# Patient Record
Sex: Female | Born: 1945
Health system: Southern US, Community
[De-identification: ages and names within clinical notes are randomized; demographics above are authoritative.]

## PROBLEM LIST (undated history)

## (undated) DIAGNOSIS — R112 Nausea with vomiting, unspecified: Secondary | ICD-10-CM

## (undated) DIAGNOSIS — Z9889 Other specified postprocedural states: Secondary | ICD-10-CM

## (undated) DIAGNOSIS — R0602 Shortness of breath: Secondary | ICD-10-CM

## (undated) DIAGNOSIS — I10 Essential (primary) hypertension: Secondary | ICD-10-CM

## (undated) DIAGNOSIS — C801 Malignant (primary) neoplasm, unspecified: Secondary | ICD-10-CM

## (undated) DIAGNOSIS — E785 Hyperlipidemia, unspecified: Secondary | ICD-10-CM

## (undated) DIAGNOSIS — Z923 Personal history of irradiation: Secondary | ICD-10-CM

## (undated) DIAGNOSIS — M810 Age-related osteoporosis without current pathological fracture: Secondary | ICD-10-CM

## (undated) DIAGNOSIS — K589 Irritable bowel syndrome without diarrhea: Secondary | ICD-10-CM

## (undated) HISTORY — PX: WISDOM TOOTH EXTRACTION: SHX21

## (undated) HISTORY — PX: COLONOSCOPY: SHX174

## (undated) HISTORY — PX: SKIN CANCER EXCISION: SHX779

## (undated) HISTORY — PX: BREAST ENHANCEMENT SURGERY: SHX7

## (undated) HISTORY — DX: Age-related osteoporosis without current pathological fracture: M81.0

## (undated) HISTORY — PX: EYE SURGERY: SHX253

## (undated) HISTORY — PX: BUNIONECTOMY: SHX129

## (undated) HISTORY — DX: Irritable bowel syndrome, unspecified: K58.9

## (undated) HISTORY — DX: Hyperlipidemia, unspecified: E78.5

---

## 1999-04-01 ENCOUNTER — Other Ambulatory Visit: Admission: RE | Admit: 1999-04-01 | Discharge: 1999-04-01 | Payer: Self-pay | Admitting: Obstetrics and Gynecology

## 2000-04-04 ENCOUNTER — Other Ambulatory Visit: Admission: RE | Admit: 2000-04-04 | Discharge: 2000-04-04 | Payer: Self-pay | Admitting: Obstetrics and Gynecology

## 2001-04-06 ENCOUNTER — Other Ambulatory Visit: Admission: RE | Admit: 2001-04-06 | Discharge: 2001-04-06 | Payer: Self-pay | Admitting: Obstetrics and Gynecology

## 2004-04-13 ENCOUNTER — Ambulatory Visit: Payer: Self-pay | Admitting: Family Medicine

## 2004-04-16 ENCOUNTER — Ambulatory Visit: Payer: Self-pay | Admitting: Family Medicine

## 2005-01-12 ENCOUNTER — Ambulatory Visit: Payer: Self-pay | Admitting: Family Medicine

## 2005-04-22 ENCOUNTER — Ambulatory Visit: Payer: Self-pay | Admitting: Family Medicine

## 2005-04-28 ENCOUNTER — Encounter: Admission: RE | Admit: 2005-04-28 | Discharge: 2005-04-28 | Payer: Self-pay | Admitting: Family Medicine

## 2005-08-04 ENCOUNTER — Other Ambulatory Visit: Admission: RE | Admit: 2005-08-04 | Discharge: 2005-08-04 | Payer: Self-pay | Admitting: Obstetrics and Gynecology

## 2006-01-24 ENCOUNTER — Encounter: Admission: RE | Admit: 2006-01-24 | Discharge: 2006-01-24 | Payer: Self-pay | Admitting: Radiology

## 2006-08-05 ENCOUNTER — Encounter: Payer: Self-pay | Admitting: Family Medicine

## 2006-10-20 ENCOUNTER — Ambulatory Visit: Payer: Self-pay | Admitting: Family Medicine

## 2006-10-20 ENCOUNTER — Telehealth (INDEPENDENT_AMBULATORY_CARE_PROVIDER_SITE_OTHER): Payer: Self-pay | Admitting: *Deleted

## 2006-10-20 DIAGNOSIS — Z8719 Personal history of other diseases of the digestive system: Secondary | ICD-10-CM | POA: Insufficient documentation

## 2006-10-20 DIAGNOSIS — R079 Chest pain, unspecified: Secondary | ICD-10-CM | POA: Insufficient documentation

## 2006-10-24 ENCOUNTER — Telehealth (INDEPENDENT_AMBULATORY_CARE_PROVIDER_SITE_OTHER): Payer: Self-pay | Admitting: *Deleted

## 2006-10-25 ENCOUNTER — Ambulatory Visit: Payer: Self-pay

## 2006-10-25 ENCOUNTER — Encounter (INDEPENDENT_AMBULATORY_CARE_PROVIDER_SITE_OTHER): Payer: Self-pay | Admitting: Family Medicine

## 2006-11-11 ENCOUNTER — Ambulatory Visit: Payer: Self-pay | Admitting: Family Medicine

## 2006-11-11 DIAGNOSIS — K219 Gastro-esophageal reflux disease without esophagitis: Secondary | ICD-10-CM | POA: Insufficient documentation

## 2006-11-11 DIAGNOSIS — D6489 Other specified anemias: Secondary | ICD-10-CM | POA: Insufficient documentation

## 2006-11-11 DIAGNOSIS — E785 Hyperlipidemia, unspecified: Secondary | ICD-10-CM | POA: Insufficient documentation

## 2006-11-11 DIAGNOSIS — R05 Cough: Secondary | ICD-10-CM

## 2006-11-11 DIAGNOSIS — M810 Age-related osteoporosis without current pathological fracture: Secondary | ICD-10-CM | POA: Insufficient documentation

## 2006-11-11 DIAGNOSIS — R059 Cough, unspecified: Secondary | ICD-10-CM | POA: Insufficient documentation

## 2006-11-14 ENCOUNTER — Telehealth (INDEPENDENT_AMBULATORY_CARE_PROVIDER_SITE_OTHER): Payer: Self-pay | Admitting: *Deleted

## 2006-11-15 ENCOUNTER — Ambulatory Visit: Payer: Self-pay | Admitting: Cardiology

## 2006-11-15 LAB — CONVERTED CEMR LAB
ALT: 12 units/L (ref 0–35)
AST: 18 units/L (ref 0–37)
Albumin: 3.8 g/dL (ref 3.5–5.2)
Alkaline Phosphatase: 50 units/L (ref 39–117)
Basophils Absolute: 0.1 10*3/uL (ref 0.0–0.1)
Basophils Relative: 1.1 % — ABNORMAL HIGH (ref 0.0–1.0)
Bilirubin, Direct: 0.1 mg/dL (ref 0.0–0.3)
Cholesterol: 246 mg/dL (ref 0–200)
Direct LDL: 164.3 mg/dL
Eosinophils Absolute: 0.3 10*3/uL (ref 0.0–0.6)
Eosinophils Relative: 5.4 % — ABNORMAL HIGH (ref 0.0–5.0)
Ferritin: 21.5 ng/mL (ref 10.0–291.0)
HCT: 35.6 % — ABNORMAL LOW (ref 36.0–46.0)
HDL: 71.3 mg/dL (ref >39.0)
Hemoglobin: 12.1 g/dL (ref 12.0–15.0)
Iron: 135 ug/dL (ref 42–145)
Lymphocytes Relative: 23.9 % (ref 12.0–46.0)
MCHC: 34.1 g/dL (ref 30.0–36.0)
MCV: 96.5 fL (ref 78.0–100.0)
Monocytes Absolute: 0.6 10*3/uL (ref 0.2–0.7)
Monocytes Relative: 11.3 % — ABNORMAL HIGH (ref 3.0–11.0)
Neutro Abs: 3.3 10*3/uL (ref 1.4–7.7)
Neutrophils Relative %: 58.3 % (ref 43.0–77.0)
Platelets: 246 10*3/uL (ref 150–400)
RBC: 3.68 M/uL — ABNORMAL LOW (ref 3.87–5.11)
RDW: 12.2 % (ref 11.5–14.6)
Total Bilirubin: 1 mg/dL (ref 0.3–1.2)
Total CHOL/HDL Ratio: 3.5
Total Protein: 7.3 g/dL (ref 6.0–8.3)
Triglycerides: 57 mg/dL (ref 0–149)
VLDL: 11 mg/dL (ref 0–40)
WBC: 5.6 10*3/uL (ref 4.5–10.5)

## 2006-11-17 ENCOUNTER — Ambulatory Visit: Payer: Self-pay | Admitting: Cardiology

## 2006-11-17 LAB — CONVERTED CEMR LAB
BUN: 11 mg/dL (ref 6–23)
Basophils Absolute: 0 10*3/uL (ref 0.0–0.1)
Basophils Relative: 0.8 % (ref 0.0–1.0)
CO2: 33 meq/L — ABNORMAL HIGH (ref 19–32)
Calcium: 9.7 mg/dL (ref 8.4–10.5)
Chloride: 102 meq/L (ref 96–112)
Creatinine, Ser: 0.7 mg/dL (ref 0.4–1.2)
Eosinophils Absolute: 0.3 10*3/uL (ref 0.0–0.6)
Eosinophils Relative: 5.6 % — ABNORMAL HIGH (ref 0.0–5.0)
GFR calc Af Amer: 111 mL/min
GFR calc non Af Amer: 91 mL/min
Glucose, Bld: 85 mg/dL (ref 70–99)
HCT: 33.9 % — ABNORMAL LOW (ref 36.0–46.0)
Hemoglobin: 11.8 g/dL — ABNORMAL LOW (ref 12.0–15.0)
INR: 0.8 (ref 0.8–1.0)
Lymphocytes Relative: 23.2 % (ref 12.0–46.0)
MCHC: 34.7 g/dL (ref 30.0–36.0)
MCV: 96.2 fL (ref 78.0–100.0)
Monocytes Absolute: 0.4 10*3/uL (ref 0.2–0.7)
Monocytes Relative: 8.8 % (ref 3.0–11.0)
Neutro Abs: 3.2 10*3/uL (ref 1.4–7.7)
Neutrophils Relative %: 61.6 % (ref 43.0–77.0)
Platelets: 228 10*3/uL (ref 150–400)
Potassium: 4 meq/L (ref 3.5–5.1)
Prothrombin Time: 10.8 s — ABNORMAL LOW (ref 10.9–13.3)
RBC: 3.52 M/uL — ABNORMAL LOW (ref 3.87–5.11)
RDW: 11.7 % (ref 11.5–14.6)
Sodium: 142 meq/L (ref 135–145)
WBC: 5.1 10*3/uL (ref 4.5–10.5)
aPTT: 26.3 s (ref 21.7–29.8)

## 2006-11-21 ENCOUNTER — Ambulatory Visit: Payer: Self-pay | Admitting: Cardiology

## 2006-11-21 ENCOUNTER — Inpatient Hospital Stay (HOSPITAL_BASED_OUTPATIENT_CLINIC_OR_DEPARTMENT_OTHER): Admission: RE | Admit: 2006-11-21 | Discharge: 2006-11-21 | Payer: Self-pay | Admitting: Cardiology

## 2006-12-05 ENCOUNTER — Ambulatory Visit: Payer: Self-pay | Admitting: Family Medicine

## 2006-12-05 DIAGNOSIS — J019 Acute sinusitis, unspecified: Secondary | ICD-10-CM | POA: Insufficient documentation

## 2006-12-07 ENCOUNTER — Ambulatory Visit: Payer: Self-pay | Admitting: Cardiology

## 2007-05-10 ENCOUNTER — Ambulatory Visit: Payer: Self-pay | Admitting: Internal Medicine

## 2007-05-18 ENCOUNTER — Telehealth (INDEPENDENT_AMBULATORY_CARE_PROVIDER_SITE_OTHER): Payer: Self-pay | Admitting: *Deleted

## 2007-05-24 ENCOUNTER — Telehealth (INDEPENDENT_AMBULATORY_CARE_PROVIDER_SITE_OTHER): Payer: Self-pay | Admitting: *Deleted

## 2007-06-20 ENCOUNTER — Ambulatory Visit: Payer: Self-pay | Admitting: Family Medicine

## 2007-06-20 LAB — CONVERTED CEMR LAB: Inflenza A Ag: NEGATIVE

## 2007-09-26 ENCOUNTER — Encounter: Payer: Self-pay | Admitting: Family Medicine

## 2007-10-04 ENCOUNTER — Encounter: Payer: Self-pay | Admitting: Family Medicine

## 2007-10-04 ENCOUNTER — Telehealth (INDEPENDENT_AMBULATORY_CARE_PROVIDER_SITE_OTHER): Payer: Self-pay | Admitting: *Deleted

## 2008-02-20 ENCOUNTER — Ambulatory Visit: Payer: Self-pay | Admitting: Family Medicine

## 2008-02-20 DIAGNOSIS — R42 Dizziness and giddiness: Secondary | ICD-10-CM | POA: Insufficient documentation

## 2008-03-05 ENCOUNTER — Telehealth (INDEPENDENT_AMBULATORY_CARE_PROVIDER_SITE_OTHER): Payer: Self-pay | Admitting: *Deleted

## 2008-03-14 ENCOUNTER — Ambulatory Visit: Payer: Self-pay | Admitting: Family Medicine

## 2008-05-14 ENCOUNTER — Ambulatory Visit: Payer: Self-pay | Admitting: Internal Medicine

## 2008-05-21 ENCOUNTER — Ambulatory Visit: Payer: Self-pay | Admitting: Family Medicine

## 2008-05-21 DIAGNOSIS — K5289 Other specified noninfective gastroenteritis and colitis: Secondary | ICD-10-CM | POA: Insufficient documentation

## 2008-05-21 LAB — CONVERTED CEMR LAB
ALT: 14 units/L (ref 0–35)
AST: 21 units/L (ref 0–37)
Albumin: 3.7 g/dL (ref 3.5–5.2)
Alkaline Phosphatase: 58 units/L (ref 39–117)
Bilirubin, Direct: 0.1 mg/dL (ref 0.0–0.3)
Cholesterol: 155 mg/dL (ref 0–200)
HDL: 57.4 mg/dL (ref >39.00)
LDL Cholesterol: 80 mg/dL (ref 0–99)
Total Bilirubin: 0.6 mg/dL (ref 0.3–1.2)
Total CHOL/HDL Ratio: 3
Total Protein: 7.2 g/dL (ref 6.0–8.3)
Triglycerides: 89 mg/dL (ref 0.0–149.0)
VLDL: 17.8 mg/dL (ref 0.0–40.0)

## 2008-05-26 LAB — CONVERTED CEMR LAB
ALT: 67 units/L — ABNORMAL HIGH (ref 0–35)
AST: 49 units/L — ABNORMAL HIGH (ref 0–37)
Albumin: 3.8 g/dL (ref 3.5–5.2)
Alkaline Phosphatase: 91 units/L (ref 39–117)
BUN: 13 mg/dL (ref 6–23)
Basophils Absolute: 0 10*3/uL (ref 0.0–0.1)
Basophils Relative: 0.5 % (ref 0.0–3.0)
Bilirubin, Direct: 0.1 mg/dL (ref 0.0–0.3)
CO2: 31 meq/L (ref 19–32)
Calcium: 9.1 mg/dL (ref 8.4–10.5)
Chloride: 102 meq/L (ref 96–112)
Creatinine, Ser: 0.7 mg/dL (ref 0.4–1.2)
Eosinophils Absolute: 0.2 10*3/uL (ref 0.0–0.7)
Eosinophils Relative: 2.6 % (ref 0.0–5.0)
GFR calc non Af Amer: 90.61 mL/min (ref >60)
Glucose, Bld: 79 mg/dL (ref 70–99)
HCT: 39 % (ref 36.0–46.0)
Hemoglobin: 13.6 g/dL (ref 12.0–15.0)
Lymphocytes Relative: 12.1 % (ref 12.0–46.0)
Lymphs Abs: 1 10*3/uL (ref 0.7–4.0)
MCHC: 34.8 g/dL (ref 30.0–36.0)
MCV: 95.5 fL (ref 78.0–100.0)
Monocytes Absolute: 0.8 10*3/uL (ref 0.1–1.0)
Monocytes Relative: 9.8 % (ref 3.0–12.0)
Neutro Abs: 5.9 10*3/uL (ref 1.4–7.7)
Neutrophils Relative %: 75 % (ref 43.0–77.0)
Platelets: 205 10*3/uL (ref 150.0–400.0)
Potassium: 3.7 meq/L (ref 3.5–5.1)
RBC: 4.09 M/uL (ref 3.87–5.11)
RDW: 11.3 % — ABNORMAL LOW (ref 11.5–14.6)
Sodium: 139 meq/L (ref 135–145)
Total Bilirubin: 0.7 mg/dL (ref 0.3–1.2)
Total Protein: 7.6 g/dL (ref 6.0–8.3)
WBC: 7.9 10*3/uL (ref 4.5–10.5)

## 2008-05-27 ENCOUNTER — Encounter (INDEPENDENT_AMBULATORY_CARE_PROVIDER_SITE_OTHER): Payer: Self-pay | Admitting: *Deleted

## 2008-06-04 ENCOUNTER — Telehealth: Payer: Self-pay | Admitting: Family Medicine

## 2008-09-25 ENCOUNTER — Telehealth (INDEPENDENT_AMBULATORY_CARE_PROVIDER_SITE_OTHER): Payer: Self-pay | Admitting: *Deleted

## 2008-10-30 ENCOUNTER — Telehealth (INDEPENDENT_AMBULATORY_CARE_PROVIDER_SITE_OTHER): Payer: Self-pay | Admitting: *Deleted

## 2008-10-31 ENCOUNTER — Encounter (INDEPENDENT_AMBULATORY_CARE_PROVIDER_SITE_OTHER): Payer: Self-pay | Admitting: *Deleted

## 2008-10-31 ENCOUNTER — Ambulatory Visit: Payer: Self-pay | Admitting: Family Medicine

## 2008-10-31 DIAGNOSIS — IMO0001 Reserved for inherently not codable concepts without codable children: Secondary | ICD-10-CM | POA: Insufficient documentation

## 2008-10-31 DIAGNOSIS — R0609 Other forms of dyspnea: Secondary | ICD-10-CM

## 2008-10-31 DIAGNOSIS — R0600 Dyspnea, unspecified: Secondary | ICD-10-CM | POA: Insufficient documentation

## 2008-10-31 DIAGNOSIS — R0989 Other specified symptoms and signs involving the circulatory and respiratory systems: Secondary | ICD-10-CM

## 2008-11-08 ENCOUNTER — Ambulatory Visit: Payer: Self-pay | Admitting: Internal Medicine

## 2008-11-12 ENCOUNTER — Encounter: Payer: Self-pay | Admitting: Family Medicine

## 2008-11-25 ENCOUNTER — Telehealth: Payer: Self-pay | Admitting: Family Medicine

## 2008-12-02 ENCOUNTER — Ambulatory Visit: Payer: Self-pay | Admitting: Family Medicine

## 2008-12-03 ENCOUNTER — Ambulatory Visit: Payer: Self-pay | Admitting: Family Medicine

## 2008-12-04 ENCOUNTER — Encounter (INDEPENDENT_AMBULATORY_CARE_PROVIDER_SITE_OTHER): Payer: Self-pay | Admitting: *Deleted

## 2008-12-13 ENCOUNTER — Telehealth (INDEPENDENT_AMBULATORY_CARE_PROVIDER_SITE_OTHER): Payer: Self-pay | Admitting: *Deleted

## 2008-12-16 ENCOUNTER — Telehealth (INDEPENDENT_AMBULATORY_CARE_PROVIDER_SITE_OTHER): Payer: Self-pay | Admitting: *Deleted

## 2008-12-18 ENCOUNTER — Telehealth: Payer: Self-pay | Admitting: Family Medicine

## 2008-12-25 ENCOUNTER — Ambulatory Visit: Payer: Self-pay | Admitting: Family Medicine

## 2009-01-08 ENCOUNTER — Telehealth: Payer: Self-pay | Admitting: Family Medicine

## 2009-01-08 ENCOUNTER — Ambulatory Visit: Payer: Self-pay | Admitting: Family

## 2009-01-08 LAB — CONVERTED CEMR LAB: Total CK: 81 units/L (ref 7–177)

## 2009-01-17 ENCOUNTER — Telehealth (INDEPENDENT_AMBULATORY_CARE_PROVIDER_SITE_OTHER): Payer: Self-pay | Admitting: *Deleted

## 2009-03-04 ENCOUNTER — Ambulatory Visit: Payer: Self-pay | Admitting: Family Medicine

## 2009-03-05 ENCOUNTER — Telehealth: Payer: Self-pay | Admitting: Family Medicine

## 2009-03-07 ENCOUNTER — Telehealth: Payer: Self-pay | Admitting: Family Medicine

## 2009-03-14 ENCOUNTER — Ambulatory Visit: Payer: Self-pay | Admitting: Family Medicine

## 2009-03-18 ENCOUNTER — Encounter: Payer: Self-pay | Admitting: Family Medicine

## 2009-03-27 ENCOUNTER — Telehealth: Payer: Self-pay | Admitting: Family Medicine

## 2009-04-04 ENCOUNTER — Ambulatory Visit: Payer: Self-pay | Admitting: Pulmonary Disease

## 2009-09-12 ENCOUNTER — Ambulatory Visit: Payer: Self-pay | Admitting: Family Medicine

## 2009-09-12 DIAGNOSIS — R5383 Other fatigue: Secondary | ICD-10-CM

## 2009-09-12 DIAGNOSIS — R5381 Other malaise: Secondary | ICD-10-CM | POA: Insufficient documentation

## 2009-09-15 LAB — CONVERTED CEMR LAB
ALT: 13 units/L (ref 0–35)
AST: 19 units/L (ref 0–37)
Albumin: 4 g/dL (ref 3.5–5.2)
Alkaline Phosphatase: 46 units/L (ref 39–117)
BUN: 24 mg/dL — ABNORMAL HIGH (ref 6–23)
Basophils Absolute: 0 10*3/uL (ref 0.0–0.1)
Basophils Relative: 0.7 % (ref 0.0–3.0)
Bilirubin, Direct: 0.1 mg/dL (ref 0.0–0.3)
CO2: 29 meq/L (ref 19–32)
Calcium: 9.6 mg/dL (ref 8.4–10.5)
Chloride: 103 meq/L (ref 96–112)
Creatinine, Ser: 0.8 mg/dL (ref 0.4–1.2)
Eosinophils Absolute: 0.1 10*3/uL (ref 0.0–0.7)
Eosinophils Relative: 1.4 % (ref 0.0–5.0)
Folate: 13.6 ng/mL
GFR calc non Af Amer: 76.23 mL/min (ref >60)
Glucose, Bld: 136 mg/dL — ABNORMAL HIGH (ref 70–99)
HCT: 36.2 % (ref 36.0–46.0)
Hemoglobin: 12.4 g/dL (ref 12.0–15.0)
Lymphocytes Relative: 24.5 % (ref 12.0–46.0)
Lymphs Abs: 1.7 10*3/uL (ref 0.7–4.0)
MCHC: 34.3 g/dL (ref 30.0–36.0)
MCV: 95.9 fL (ref 78.0–100.0)
Monocytes Absolute: 0.5 10*3/uL (ref 0.1–1.0)
Monocytes Relative: 7.5 % (ref 3.0–12.0)
Neutro Abs: 4.7 10*3/uL (ref 1.4–7.7)
Neutrophils Relative %: 65.9 % (ref 43.0–77.0)
Platelets: 212 10*3/uL (ref 150.0–400.0)
Potassium: 4.3 meq/L (ref 3.5–5.1)
RBC: 3.77 M/uL — ABNORMAL LOW (ref 3.87–5.11)
RDW: 12.4 % (ref 11.5–14.6)
Sodium: 141 meq/L (ref 135–145)
TSH: 1.35 microintl units/mL (ref 0.35–5.50)
Total Bilirubin: 0.6 mg/dL (ref 0.3–1.2)
Total Protein: 6.9 g/dL (ref 6.0–8.3)
Vitamin B-12: 641 pg/mL (ref 211–911)
WBC: 7.1 10*3/uL (ref 4.5–10.5)

## 2009-09-19 ENCOUNTER — Ambulatory Visit: Payer: Self-pay | Admitting: Family Medicine

## 2009-09-22 ENCOUNTER — Telehealth (INDEPENDENT_AMBULATORY_CARE_PROVIDER_SITE_OTHER): Payer: Self-pay | Admitting: *Deleted

## 2009-09-22 LAB — CONVERTED CEMR LAB
Cholesterol: 269 mg/dL — ABNORMAL HIGH (ref 0–200)
Direct LDL: 193.3 mg/dL
HDL: 62.4 mg/dL (ref >39.00)
Total CHOL/HDL Ratio: 4
Triglycerides: 90 mg/dL (ref 0.0–149.0)
VLDL: 18 mg/dL (ref 0.0–40.0)

## 2009-09-23 LAB — CONVERTED CEMR LAB
Glucose, Bld: 85 mg/dL (ref 70–99)
Hgb A1c MFr Bld: 5.7 % (ref 4.6–6.5)

## 2009-11-07 ENCOUNTER — Ambulatory Visit: Payer: Self-pay | Admitting: Internal Medicine

## 2009-11-07 DIAGNOSIS — J069 Acute upper respiratory infection, unspecified: Secondary | ICD-10-CM | POA: Insufficient documentation

## 2009-12-18 ENCOUNTER — Ambulatory Visit: Payer: Self-pay | Admitting: Family Medicine

## 2009-12-25 LAB — CONVERTED CEMR LAB
ALT: 10 units/L (ref 0–35)
AST: 17 units/L (ref 0–37)
Albumin: 3.7 g/dL (ref 3.5–5.2)
Alkaline Phosphatase: 49 units/L (ref 39–117)
Bilirubin, Direct: 0.1 mg/dL (ref 0.0–0.3)
Cholesterol: 224 mg/dL — ABNORMAL HIGH (ref 0–200)
Direct LDL: 138.2 mg/dL
HDL: 57.9 mg/dL (ref >39.00)
Total Bilirubin: 0.9 mg/dL (ref 0.3–1.2)
Total CHOL/HDL Ratio: 4
Total Protein: 6.7 g/dL (ref 6.0–8.3)
Triglycerides: 83 mg/dL (ref 0.0–149.0)
VLDL: 16.6 mg/dL (ref 0.0–40.0)

## 2010-02-01 LAB — CONVERTED CEMR LAB
ALT: 13 units/L (ref 0–35)
ALT: 14 units/L (ref 0–35)
ALT: 15 units/L (ref 0–35)
AST: 18 units/L (ref 0–37)
AST: 19 units/L (ref 0–37)
AST: 19 units/L (ref 0–37)
Albumin: 4 g/dL (ref 3.5–5.2)
Albumin: 4 g/dL (ref 3.5–5.2)
Albumin: 4.1 g/dL (ref 3.5–5.2)
Alkaline Phosphatase: 49 units/L (ref 39–117)
Alkaline Phosphatase: 51 units/L (ref 39–117)
Alkaline Phosphatase: 53 units/L (ref 39–117)
BUN: 15 mg/dL (ref 6–23)
BUN: 16 mg/dL (ref 6–23)
BUN: 18 mg/dL (ref 6–23)
Basophils Absolute: 0 10*3/uL (ref 0.0–0.1)
Basophils Absolute: 0 10*3/uL (ref 0.0–0.1)
Basophils Relative: 0.5 % (ref 0.0–3.0)
Basophils Relative: 0.7 % (ref 0.0–1.0)
Bilirubin, Direct: 0 mg/dL (ref 0.0–0.3)
Bilirubin, Direct: 0 mg/dL (ref 0.0–0.3)
Bilirubin, Direct: 0.1 mg/dL (ref 0.0–0.3)
CO2: 30 meq/L (ref 19–32)
CO2: 32 meq/L (ref 19–32)
CO2: 33 meq/L — ABNORMAL HIGH (ref 19–32)
Calcium: 9.4 mg/dL (ref 8.4–10.5)
Calcium: 9.4 mg/dL (ref 8.4–10.5)
Calcium: 9.6 mg/dL (ref 8.4–10.5)
Chloride: 102 meq/L (ref 96–112)
Chloride: 102 meq/L (ref 96–112)
Chloride: 105 meq/L (ref 96–112)
Creatinine, Ser: 0.7 mg/dL (ref 0.4–1.2)
Creatinine, Ser: 0.8 mg/dL (ref 0.4–1.2)
Creatinine, Ser: 0.8 mg/dL (ref 0.4–1.2)
Eosinophils Absolute: 0.1 10*3/uL (ref 0.0–0.7)
Eosinophils Absolute: 0.2 10*3/uL (ref 0.0–0.6)
Eosinophils Relative: 2.7 % (ref 0.0–5.0)
Eosinophils Relative: 2.7 % (ref 0.0–5.0)
Folate: >20 ng/mL
GFR calc Af Amer: 111 mL/min
GFR calc Af Amer: 94 mL/min
GFR calc non Af Amer: 77.53 mL/min (ref >60)
GFR calc non Af Amer: 78 mL/min
GFR calc non Af Amer: 91 mL/min
Glucose, Bld: 86 mg/dL (ref 70–99)
Glucose, Bld: 86 mg/dL (ref 70–99)
Glucose, Bld: 86 mg/dL (ref 70–99)
HCT: 30.8 % — ABNORMAL LOW (ref 36.0–46.0)
HCT: 36.7 % (ref 36.0–46.0)
Hemoglobin: 10.7 g/dL — ABNORMAL LOW (ref 12.0–15.0)
Hemoglobin: 12.9 g/dL (ref 12.0–15.0)
Lymphocytes Relative: 27.7 % (ref 12.0–46.0)
Lymphocytes Relative: 29.8 % (ref 12.0–46.0)
MCHC: 34.7 g/dL (ref 30.0–36.0)
MCHC: 35.3 g/dL (ref 30.0–36.0)
MCV: 93.7 fL (ref 78.0–100.0)
MCV: 94.7 fL (ref 78.0–100.0)
Monocytes Absolute: 0.5 10*3/uL (ref 0.1–1.0)
Monocytes Absolute: 0.6 10*3/uL (ref 0.2–0.7)
Monocytes Relative: 8.5 % (ref 3.0–12.0)
Monocytes Relative: 9.2 % (ref 3.0–11.0)
Neutro Abs: 3.2 10*3/uL (ref 1.4–7.7)
Neutro Abs: 3.4 10*3/uL (ref 1.4–7.7)
Neutrophils Relative %: 57.6 % (ref 43.0–77.0)
Neutrophils Relative %: 60.6 % (ref 43.0–77.0)
Platelets: 212 10*3/uL (ref 150–400)
Platelets: 216 10*3/uL (ref 150–400)
Potassium: 4.1 meq/L (ref 3.5–5.1)
Potassium: 4.3 meq/L (ref 3.5–5.1)
Potassium: 4.4 meq/L (ref 3.5–5.1)
RBC: 3.29 M/uL — ABNORMAL LOW (ref 3.87–5.11)
RBC: 3.87 M/uL (ref 3.87–5.11)
RDW: 11.4 % — ABNORMAL LOW (ref 11.5–14.6)
RDW: 11.6 % (ref 11.5–14.6)
Sodium: 141 meq/L (ref 135–145)
Sodium: 141 meq/L (ref 135–145)
Sodium: 141 meq/L (ref 135–145)
TSH: 1.24 microintl units/mL (ref 0.35–5.50)
TSH: 1.83 microintl units/mL (ref 0.35–5.50)
Total Bilirubin: 0.7 mg/dL (ref 0.3–1.2)
Total Bilirubin: 0.8 mg/dL (ref 0.3–1.2)
Total Bilirubin: 0.8 mg/dL (ref 0.3–1.2)
Total Protein: 7.4 g/dL (ref 6.0–8.3)
Total Protein: 7.4 g/dL (ref 6.0–8.3)
Total Protein: 7.7 g/dL (ref 6.0–8.3)
Vitamin B-12: 751 pg/mL (ref 211–911)
WBC: 5.3 10*3/uL (ref 4.5–10.5)
WBC: 6 10*3/uL (ref 4.5–10.5)

## 2010-02-03 NOTE — Assessment & Plan Note (Signed)
Summary: shingle shot/kdc   Nurse Visit   Allergies: No Known Drug Allergies  Immunizations Administered:  Zostavax # 1:    Vaccine Type: Zostavax    Site: left deltoid    Mfr: Merck    Dose: 0.30mL    Route: IM    Given by: Floydene Flock    Exp. Date: 01/31/2010    Lot #: [id number]  Orders Added: 1)  Zoster (Shingles) Vaccine Live [90736] 2)  Admin 1st Vaccine [phone]

## 2010-02-03 NOTE — Miscellaneous (Signed)
Summary: Waiver of Liability/[employer]  Waiver of Liability/[facility]   Imported By: Lanelle Bal 02/22/2008 08:55:03  _____________________________________________________________________  External Attachment:    Type:   Image     Comment:   External Document

## 2010-02-03 NOTE — Progress Notes (Signed)
  Phone Note Call from Patient Call back at [facility] Phone [phone]   Caller: Patient Summary of Call: Pt  called left msg having a ins dilemma, wanted to know where her last lab was sent to . Called and left msg pt had creatnine done 01/08/09 done  in house sent to [facility] Initial call taken by: Kandice Hams,  January 17, 2009 5:17 PM

## 2010-02-03 NOTE — Assessment & Plan Note (Signed)
Summary: sore throat.cbs   Vital Signs:  Patient Profile:   65 Years Old Female Weight:      132.50 pounds Temp:     97.6 degrees F oral Pulse rate:   60 / minute Pulse rhythm:   regular BP sitting:   120 / 76  (left arm) Cuff size:   large  Vitals Entered By: Wendall Stade (May 10, 2007 11:38 AM)                 PCP:  Laury Axon  Chief Complaint:  sick for five days and and off and URI symptoms.  History of Present Illness: illness seems to be lingering fever up to 102 sore throat sinus pain and pressure earts hurt coughing nonproductive fatigue  URI Symptoms; Rx: Advil , baby ASA, left over cough syrup      This is a 65 year old woman who presents with URI symptoms.  The patient reports nasal congestion, sore throat, earache, and sick contacts, but denies clear nasal discharge, purulent nasal discharge, dry cough, and productive cough.  Associated symptoms include fever, fever of 100.5-103 degrees, use of an antipyretic, and response to antipyretic.  The patient denies stiff neck, dyspnea, wheezing, rash, vomiting, and diarrhea.  The patient also reports itchy throat, seasonal symptoms, headache, and severe fatigue.  The patient denies itchy watery eyes, sneezing, and muscle aches.  The patient denies the following risk factors for Strep sinusitis: double sickening, tooth pain, Strep exposure, and tender adenopathy.      Current Allergies (reviewed today): No known allergies   Past Medical History:    Reviewed history from 11/11/2006 and no changes required:       IBS       GERD       Osteoporosis       Hyperlipidemia       Current Problems:        ACUTE SINUSITIS, UNSPECIFIED (ICD-461.9)       COUGH (ICD-786.2)       HYPERLIPIDEMIA (ICD-272.4)       OTHER SPECIFIED ANEMIAS (ICD-285.8)       OSTEOPOROSIS (ICD-733.00)       GERD (ICD-530.81)       FAMILY HISTORY OF CAD FEMALE 1ST DEGREE RELATIVE <50 (ICD-V17.3)       FAMILY HISTORY BREAST CANCER 1ST DEGREE RELATIVE  <50 (ICD-V16.3)       IRRITABLE BOWEL SYNDROME, HX OF (ICD-V12.79)       CHEST PAIN, ACUTE (ICD-786.50)  Past Surgical History:    Reviewed history from 11/11/2006 and no changes required:       Breast Augmentation     Family History:    Reviewed history from 10/20/2006 and no changes required:       Family History Breast cancer 1st degree relative @58  Mother       Muncle-- esoph. ca       Maunt- [place], Breast CA              Family History of CAD Female 1st degree relative 64--  f, [place]  Social History:    Reviewed history from 10/20/2006 and no changes required:       Occupation: Sports coach       Single       Never Smoked       Alcohol use-no       Drug use-no       Regular exercise-no    Review of Systems  Eyes  Denies discharge, eye pain, red eye, and vision loss-both eyes.   Physical Exam  General:     Well-developed,well-nourished,in no acute distress; alert,appropriate and cooperative throughout examination Eyes:     No corneal or conjunctival inflammation noted. EOMI. Perrla.  Vision  decreased due to cataract. Ears:     External ear exam shows no significant lesions or deformities.  Otoscopic examination reveals clear canals, tympanic membranes are intact bilaterally without bulging, retraction, inflammation or discharge. Hearing is grossly normal bilaterally. Nose:     External nasal examination shows no deformity or inflammation. Nasal mucosa are pink and moist without lesions or exudates. Mouth:     Oral mucosa and oropharynx without lesions or exudates.  Teeth in good repair. Lungs:     Normal respiratory effort, chest expands symmetrically. Lungs are clear to auscultation, no crackles or wheezes. Heart:     Normal rate and regular rhythm. S1 and S2 normal without gallop, murmur, click, rub or other extra sounds. Cervical Nodes:     No lymphadenopathy noted Axillary Nodes:     No palpable lymphadenopathy    Impression &  Recommendations:  Problem # 1:  SINUSITIS- ACUTE-NOS (ICD-461.9)  The following medications were removed from the medication list:    Augmentin 875-125 Mg Tabs (Amoxicillin-pot clavulanate) .Marland Kitchen... 1 by mouth two times a day    Tussionex Pennkinetic Er 8-10 Mg/20ml Lqcr (Chlorpheniramine-hydrocodone) .Marland Kitchen... 1 tsp by mouth at bedtime prn  Her updated medication list for this problem includes:    Amoxicillin-pot Clavulanate 875-125 Mg Tabs (Amoxicillin-pot clavulanate) .Marland Kitchen... 1 q 12 hrs with a meal   Complete Medication List: 1)  Boniva 150 Mg Tabs (Ibandronate sodium) .Marland Kitchen.. 1 by mouth monthly 2)  Ferrex 150 150 Mg Caps (Polysaccharide iron complex) .Marland Kitchen.. 1 by mouth once daily 3)  Fish Oil 1000 Mg Caps (Omega-3 fatty acids) .Marland Kitchen.. 1 by mouth three times a day 4)  Flax Seed Oil 1000 Mg Caps (Flaxseed (linseed)) .Marland Kitchen.. 1 by mouth once daily 5)  Ocuvite  .Marland KitchenMarland Kitchen. 1 by mouth once daily 6)  Acai Friut  .Marland KitchenMarland Kitchen. 1 by mouth once daily 7)  Caltrate 600mg   .... 1 by mouth two times a day 8)  Alprazolam 0.25 Mg Tabs (Alprazolam) .Marland Kitchen.. 1 by mouth three times a day as needed 9)  Adult Aspirin Low Strength 81 Mg Tbdp (Aspirin) .Marland Kitchen.. 1 by mouth once daily 10)  Amoxicillin-pot Clavulanate 875-125 Mg Tabs (Amoxicillin-pot clavulanate) .Marland Kitchen.. 1 q 12 hrs with a meal   Patient Instructions: 1)  Drink as much fluid as you can tolerate for the next few days. Neti pot  once daily for ANY head congestion   Prescriptions: AMOXICILLIN-POT CLAVULANATE 875-125 MG  TABS (AMOXICILLIN-POT CLAVULANATE) 1 q 12 hrs with a meal  #20 x 0   Entered and Authorized by:   Marga Melnick MD   Signed by:   Marga Melnick MD on 05/10/2007   Method used:   Print then Give to Patient   RxID:   [id number]  ]

## 2010-02-03 NOTE — Progress Notes (Signed)
  Phone Note From Pharmacy   Summary of Call: Fax from pharmacy- pt would like a 3 month supply. [facility] a [facility] day supply [facility] [address]  Initial call taken by: Army Fossa CMA,  December 16, 2008 2:01 PM    Prescriptions: CRESTOR 5 MG TABS (ROSUVASTATIN CALCIUM) 1 by mouth once daily  #90 x 0   Entered by:   Army Fossa CMA   Authorized by:   Loreen Freud DO   Signed by:   Army Fossa CMA on 12/16/2008   Method used:   [facility] to        [facility] [phone]* (retail)       [address]       [place], [place]  [address]       Ph: [phone]       Fax: [phone]   RxID:   [id number]

## 2010-02-03 NOTE — Assessment & Plan Note (Signed)
Summary: acute/bad cold,congestion   Vital Signs:  Patient Profile:   65 Years Old Female Weight:      132 pounds Temp:     102.2 degrees F oral Pulse rate:   72 / minute BP sitting:   112 / 78  (right arm)  Pt. in pain?   no  Vitals Entered By: Ardyth Man (June 20, 2007 3:49 PM)                  PCP:  Laury Axon  Chief Complaint:  congestion, bad cold x 3 days, and URI symptoms.  History of Present Illness:  URI Symptoms      This is a 65 year old woman who presents with URI symptoms.  The symptoms began duration > 3 days ago.  Pt here c/o 3 day hx congestion, cough, runny nose, fever. Pt has taken an advil with no relief.  The patient reports nasal congestion, purulent nasal discharge, and productive cough.  Associated symptoms include fever of 100.5-103 degrees.  The patient also reports sneezing and headache.  The patient denies the following risk factors for Strep sinusitis: unilateral facial pain, unilateral nasal discharge, poor response to decongestant, double sickening, tooth pain, Strep exposure, tender adenopathy, and absence of cough.  B/L sinus pressure.       Current Allergies: No known allergies   Past Medical History:    Reviewed history from 05/10/2007 and no changes required:       IBS       GERD       Osteoporosis       Hyperlipidemia       Current Problems:        ACUTE SINUSITIS, UNSPECIFIED (ICD-461.9)       COUGH (ICD-786.2)       HYPERLIPIDEMIA (ICD-272.4)       OTHER SPECIFIED ANEMIAS (ICD-285.8)       OSTEOPOROSIS (ICD-733.00)       GERD (ICD-530.81)       FAMILY HISTORY OF CAD FEMALE 1ST DEGREE RELATIVE <50 (ICD-V17.3)       FAMILY HISTORY BREAST CANCER 1ST DEGREE RELATIVE <50 (ICD-V16.3)       IRRITABLE BOWEL SYNDROME, HX OF (ICD-V12.79)       CHEST PAIN, ACUTE (ICD-786.50)  Past Surgical History:    Reviewed history from 05/10/2007 and no changes required:       Breast Augmentation     Family History:    Reviewed history from  10/20/2006 and no changes required:       Family History Breast cancer 1st degree relative @58  Mother       Muncle-- esoph. ca       Maunt- MI, Breast CA              Family History of CAD Female 1st degree relative 48--  f, [place]  Social History:    Reviewed history from 10/20/2006 and no changes required:       Occupation: Sports coach       Single       Never Smoked       Alcohol use-no       Drug use-no       Regular exercise-no    Review of Systems      See HPI   Physical Exam  General:     Well-developed,well-nourished,in no acute distress; alert,appropriate and cooperative throughout examination Ears:     External ear exam shows no significant lesions or deformities.  Otoscopic examination  reveals clear canals, tympanic membranes are intact bilaterally without bulging, retraction, inflammation or discharge. Hearing is grossly normal bilaterally. Nose:     no external deformity, L frontal sinus tenderness, L maxillary sinus tenderness, R frontal sinus tenderness, and R maxillary sinus tenderness.   Mouth:     Oral mucosa and oropharynx without lesions or exudates.  Teeth in good repair. Neck:     No deformities, masses, or tenderness noted. Lungs:     Normal respiratory effort, chest expands symmetrically. Lungs are clear to auscultation, no crackles or wheezes. Heart:     normal rate, regular rhythm, and no murmur.   Psych:     Oriented X3, memory intact for recent and remote, and normally interactive.      Impression & Recommendations:  Problem # 1:  SINUSITIS- ACUTE-NOS (ICD-461.9)  The following medications were removed from the medication list:    Amoxicillin-pot Clavulanate 875-125 Mg Tabs (Amoxicillin-pot clavulanate) .Marland Kitchen... 1 q 12 hrs with a meal  Her updated medication list for this problem includes:    Augmentin 875-125 Mg Tabs (Amoxicillin-pot clavulanate) .Marland Kitchen... 1 by mouth two times a day    Tussionex Pennkinetic Er 8-10 Mg/23ml Lqcr  (Chlorpheniramine-hydrocodone) .Marland Kitchen... 1 tsp by mouth at bedtime    Veramyst 27.5 Mcg/spray Susp (Fluticasone furoate) .Marland Kitchen... 2 sprays each nostril once daily Instructed on treatment. Call if symptoms persist or worsen.   Complete Medication List: 1)  Boniva 150 Mg Tabs (Ibandronate sodium) .Marland Kitchen.. 1 by mouth monthly 2)  Ferrex 150 150 Mg Caps (Polysaccharide iron complex) .Marland Kitchen.. 1 by mouth once daily 3)  Fish Oil 1000 Mg Caps (Omega-3 fatty acids) .Marland Kitchen.. 1 by mouth three times a day 4)  Flax Seed Oil 1000 Mg Caps (Flaxseed (linseed)) .Marland Kitchen.. 1 by mouth once daily 5)  Ocuvite  .Marland KitchenMarland Kitchen. 1 by mouth once daily 6)  Acai Friut  .Marland KitchenMarland Kitchen. 1 by mouth once daily 7)  Caltrate 600mg   .... 1 by mouth two times a day 8)  Alprazolam 0.25 Mg Tabs (Alprazolam) .Marland Kitchen.. 1 by mouth three times a day as needed 9)  Adult Aspirin Low Strength 81 Mg Tbdp (Aspirin) .Marland Kitchen.. 1 by mouth once daily 10)  Augmentin 875-125 Mg Tabs (Amoxicillin-pot clavulanate) .Marland Kitchen.. 1 by mouth two times a day 11)  Tussionex Pennkinetic Er 8-10 Mg/56ml Lqcr (Chlorpheniramine-hydrocodone) .Marland Kitchen.. 1 tsp by mouth at bedtime 12)  Veramyst 27.5 Mcg/spray Susp (Fluticasone furoate) .... 2 sprays each nostril once daily    Prescriptions: VERAMYST 27.5 MCG/SPRAY  SUSP (FLUTICASONE FUROATE) 2 sprays each nostril once daily  #1 x 0   Entered and Authorized by:   Loreen Freud DO   Signed by:   Loreen Freud DO on 06/20/2007   Method used:   Historical   RxID:   [id number] TUSSIONEX PENNKINETIC ER 8-10 MG/5ML  LQCR (CHLORPHENIRAMINE-HYDROCODONE) 1 tsp by mouth at bedtime  #6 oz x 0   Entered and Authorized by:   Loreen Freud DO   Signed by:   Loreen Freud DO on 06/20/2007   Method used:   Print then Give to Patient   RxID:   [id number] AUGMENTIN 875-125 MG  TABS (AMOXICILLIN-POT CLAVULANATE) 1 by mouth two times a day  #20 x 0   Entered and Authorized by:   Loreen Freud DO   Signed by:   Loreen Freud DO on 06/20/2007   Method used:   Electronically sent to ...        [facility] [phone]*  [address]       [place], [place]  [address]       Ph: [phone]       Fax: [phone]   RxID:   [id number]  ] Laboratory Results  Date/Time Received: June 20, 2007 4:06 PM  Date/Time Reported: June 20, 2007 4:06 PM   Other Tests  Influenza: negative

## 2010-02-03 NOTE — Letter (Signed)
Summary: Results Follow up Letter  [facility]  [address] [place], [place] [address]   Phone: [phone]  Fax: [phone]    12/04/2008 MRN: [id number]  [facility] [address] [place], [place]  [address]  Dear Ms. Lyster,  The following are the results of your recent test(s):  Test         Result    Pap Smear:        Normal _____  Not Normal _____ Comments: ______________________________________________________ Cholesterol: LDL(Bad cholesterol):         Your goal is less than:         HDL (Good cholesterol):       Your goal is more than: Comments:  ______________________________________________________ Mammogram:        Normal _____  Not Normal _____ Comments:  ___________________________________________________________________ Hemoccult:        Normal _____  Not normal _______ Comments:    _____________________________________________________________________ Other Tests:  See attachment for results.   We routinely do not discuss normal results over the telephone.  If you desire a copy of the results, or you have any questions about this information we can discuss them at your next office visit.   Sincerely,    Army Fossa CMA  December 04, 2008 8:09 AM

## 2010-02-03 NOTE — Assessment & Plan Note (Signed)
Summary: DISCUSS LABS/[facility]   Vital Signs:  Patient profile:   65 year old female Weight:      137 pounds Temp:     98.7 degrees F oral Pulse rate:   76 / minute Pulse rhythm:   regular BP sitting:   122 / 80  (left arm) Cuff size:   regular  Vitals Entered By: Army Fossa CMA (December 02, 2008 11:33 AM) CC: discuss the small airway disease and check cholesterol    History of Present Illness: Pt here to  f/u PFTs.   She has not tried qvar yet.   No new complaints.    Current Medications (verified): 1)  Boniva 150 Mg  Tabs (Ibandronate Sodium) .Marland Kitchen.. 1 By Mouth Monthly 2)  Ferrex 150 150 Mg  Caps (Polysaccharide Iron Complex) .Marland Kitchen.. 1 By Mouth Once Daily 3)  Fish Oil 1000 Mg  Caps (Omega-3 Fatty Acids) .Marland Kitchen.. 1 By Mouth Three Times A Day 4)  Flax Seed Oil 1000 Mg  Caps (Flaxseed (Linseed)) .Marland Kitchen.. 1 By Mouth Once Daily 5)  Ocuvite .Marland Kitchen.. 1 By Mouth Once Daily 6)  Acai Friut .Marland Kitchen.. 1 By Mouth Once Daily 7)  Caltrate 600mg  .... 1 By Mouth Two Times A Day 8)  Adult Aspirin Low Strength 81 Mg  Tbdp (Aspirin) .Marland Kitchen.. 1 By Mouth Once Daily 9)  Levsin/sl 0.125 Mg Subl (Hyoscyamine Sulfate) .Marland Kitchen.. 1-2 Sl Q4h As Needed Abd Cramps 10)  Qvar 80 Mcg/act Aers (Beclomethasone Dipropionate) .... 2 Puffs Two Times A Day. 11)  Crestor 5 Mg Tabs (Rosuvastatin Calcium) .Marland Kitchen.. 1 By Mouth Every Other Day.  Allergies (verified): No Known Drug Allergies  Past History:  Past Medical History: Last updated: 05/10/2007 IBS GERD Osteoporosis Hyperlipidemia Current Problems:  ACUTE SINUSITIS, UNSPECIFIED (ICD-461.9) COUGH (ICD-786.2) HYPERLIPIDEMIA (ICD-272.4) OTHER SPECIFIED ANEMIAS (ICD-285.8) OSTEOPOROSIS (ICD-733.00) GERD (ICD-530.81) FAMILY HISTORY OF CAD FEMALE 1ST DEGREE RELATIVE <50 (ICD-V17.3) FAMILY HISTORY BREAST CANCER 1ST DEGREE RELATIVE <50 (ICD-V16.3) IRRITABLE BOWEL SYNDROME, HX OF (ICD-V12.79) CHEST PAIN, ACUTE (ICD-786.50)  Past Surgical History: Last updated: 05/10/2007 Breast  Augmentation    Family History: Last updated: 10/20/2006 Family History Breast cancer 1st degree relative @58  Mother Muncle-- esoph. ca Maunt- MI, Breast CA  Family History of CAD Female 1st degree relative 34--  f, [place]  Social History: Last updated: 10/20/2006 Occupation: Sports coach Single Never Smoked Alcohol use-no Drug use-no Regular exercise-no  Risk Factors: Exercise: no (10/20/2006)  Risk Factors: Smoking Status: never (10/20/2006) Passive Smoke Exposure: no (10/20/2006)  Family History: Reviewed history from 10/20/2006 and no changes required. Family History Breast cancer 1st degree relative @58  Mother Muncle-- esoph. ca Maunt- MI, Breast CA  Family History of CAD Female 1st degree relative 57--  f, [place]  Social History: Reviewed history from 10/20/2006 and no changes required. 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 Lungs are clear to auscultation, no crackles or wheezes. Heart:  normal rate and no murmur.   Extremities:  No clubbing, cyanosis, edema, or deformity noted with normal full range of motion of all joints.   Skin:  Intact without suspicious lesions or rashes Cervical Nodes:  No lymphadenopathy noted Psych:  Cognition and judgment appear intact. Alert and cooperative with normal attention span and concentration. No apparent delusions, illusions, hallucinations   Impression &  Recommendations:  Problem # 1:  DYSPNEA ON EXERTION (ICD-786.09) suggested pulm referral--- pt wanted to try qvar for a little while and think about it Her updated medication list for this problem includes:    Qvar 80 Mcg/act Aers (Beclomethasone dipropionate) .Marland Kitchen... 2 puffs two times a  day.  Recommended fluid and salt restriction.   Complete Medication List: 1)  Boniva 150 Mg Tabs (Ibandronate sodium) .Marland Kitchen.. 1 by mouth monthly 2)  Ferrex 150 150 Mg Caps (Polysaccharide iron complex) .Marland Kitchen.. 1 by mouth once daily 3)  Fish Oil 1000 Mg Caps (Omega-3 fatty acids) .Marland Kitchen.. 1 by mouth three times a day 4)  Flax Seed Oil 1000 Mg Caps (Flaxseed (linseed)) .Marland Kitchen.. 1 by mouth once daily 5)  Ocuvite  .Marland KitchenMarland Kitchen. 1 by mouth once daily 6)  Acai Friut  .Marland KitchenMarland Kitchen. 1 by mouth once daily 7)  Caltrate 600mg   .... 1 by mouth two times a day 8)  Adult Aspirin Low Strength 81 Mg Tbdp (Aspirin) .Marland Kitchen.. 1 by mouth once daily 9)  Levsin/sl 0.125 Mg Subl (Hyoscyamine sulfate) .Marland Kitchen.. 1-2 sl q4h as needed abd cramps 10)  Qvar 80 Mcg/act Aers (Beclomethasone dipropionate) .... 2 puffs two times a day. 11)  Crestor 5 Mg Tabs (Rosuvastatin calcium) .Marland Kitchen.. 1 by mouth every other day.  Other Orders: Venipuncture ([id number]) Prescriptions: FERREX 150 150 MG  CAPS (POLYSACCHARIDE IRON COMPLEX) 1 by mouth once daily  #100 x 0   Entered by:   Army Fossa CMA   Authorized by:   Loreen Freud DO   Signed by:   Army Fossa CMA on 12/02/2008   Method used:   Electronically to        [facility] [phone]* (retail)       [address]       [place], [place]  [address]       Ph: [phone]       Fax: [phone]   RxID:   [id number]

## 2010-02-03 NOTE — Assessment & Plan Note (Signed)
Summary: respiratory issues/kdc   Vital Signs:  Patient profile:   65 year old female Weight:      135 pounds Temp:     98.8 degrees F oral Pulse rate:   74 / minute Pulse rhythm:   regular BP sitting:   122 / 80  (left arm) Cuff size:   regular  Vitals Entered By: Army Fossa CMA (October 31, 2008 10:49 AM) CC: SOB, and no strength in legs. Disucss crestor.  Is Patient Diabetic? Yes   History of Present Illness: Pt here c/o SOB with exertion.  This has been going on for years and pt had cardiac wu thatwas normal.  Pt c/o feeling tight in chest with exertion only.  She has never been dx with asthma etc.no other complaints.    Current Medications (verified): 1)  Boniva 150 Mg  Tabs (Ibandronate Sodium) .Marland Kitchen.. 1 By Mouth Monthly 2)  Ferrex 150 150 Mg  Caps (Polysaccharide Iron Complex) .Marland Kitchen.. 1 By Mouth Once Daily 3)  Fish Oil 1000 Mg  Caps (Omega-3 Fatty Acids) .Marland Kitchen.. 1 By Mouth Three Times A Day 4)  Flax Seed Oil 1000 Mg  Caps (Flaxseed (Linseed)) .Marland Kitchen.. 1 By Mouth Once Daily 5)  Ocuvite .Marland Kitchen.. 1 By Mouth Once Daily 6)  Acai Friut .Marland Kitchen.. 1 By Mouth Once Daily 7)  Caltrate 600mg  .... 1 By Mouth Two Times A Day 8)  Adult Aspirin Low Strength 81 Mg  Tbdp (Aspirin) .Marland Kitchen.. 1 By Mouth Once Daily 9)  Crestor 10 Mg Tabs (Rosuvastatin Calcium) .... Take One Tablet Daily 10)  Levsin/sl 0.125 Mg Subl (Hyoscyamine Sulfate) .Marland Kitchen.. 1-2 Sl Q4h As Needed Abd Cramps  Allergies (verified): No Known Drug Allergies  Past History:  Past Medical History: Last updated: 05/10/2007 IBS GERD Osteoporosis Hyperlipidemia Current Problems:  ACUTE SINUSITIS, UNSPECIFIED (ICD-461.9) COUGH (ICD-786.2) HYPERLIPIDEMIA (ICD-272.4) OTHER SPECIFIED ANEMIAS (ICD-285.8) OSTEOPOROSIS (ICD-733.00) GERD (ICD-530.81) FAMILY HISTORY OF CAD FEMALE 1ST DEGREE RELATIVE <50 (ICD-V17.3) FAMILY HISTORY BREAST CANCER 1ST DEGREE RELATIVE <50 (ICD-V16.3) IRRITABLE BOWEL SYNDROME, HX OF (ICD-V12.79) CHEST PAIN, ACUTE  (ICD-786.50)  Past Surgical History: Last updated: 05/10/2007 Breast Augmentation    Family History: Last updated: 10/20/2006 Family History Breast cancer 1st degree relative @58  Mother Muncle-- esoph. ca [place]- [place], Breast CA  Family History of CAD Female 1st degree relative 10--  f, [place]  Social History: Last updated: 10/20/2006 Occupation: Sports coach Single Never Smoked Alcohol use-no Drug use-no Regular exercise-no  Risk Factors: Exercise: no (10/20/2006)  Risk Factors: Smoking Status: never (10/20/2006) Passive Smoke Exposure: no (10/20/2006)  Review of Systems      See HPI  Physical Exam  General:  Well-developed,well-nourished,in no acute distress; alert,appropriate and cooperative throughout examination Lungs:  Normal respiratory effort, chest expands symmetrically. Lungs are clear to auscultation, no crackles or wheezes. Heart:  Normal rate and regular rhythm. S1 and S2 normal without gallop, murmur, click, rub or other extra sounds. Msk:  normal ROM, no joint tenderness, no joint swelling, no joint warmth, no redness over joints, no joint deformities, no joint instability, and no crepitation.   Extremities:  No clubbing, cyanosis, edema, or deformity noted with normal full range of motion of all joints.   Neurologic:  alert & oriented X3 and gait normal.   Skin:  Intact without suspicious lesions or rashes Cervical Nodes:  No lymphadenopathy noted Psych:  Oriented X3 and normally interactive.     Impression & Recommendations:  Problem # 1:  MYALGIA (ICD-729.1)  stop crestor and see if symptoms  resolve pt to call in 2-3 weeks  Her updated medication list for this problem includes:    Adult Aspirin Low Strength 81 Mg Tbdp (Aspirin) .Marland Kitchen... 1 by mouth once daily  Orders: EKG w/ Interpretation (93000)  Problem # 2:  DYSPNEA ON EXERTION (ICD-786.09)  pt had full cardiac w/u---negative pt needs tostart exercise ---she is aware and will  start  Orders: Pulmonary Referral (Pulmonary) EKG w/ Interpretation (93000)  Complete Medication List: 1)  Boniva 150 Mg Tabs (Ibandronate sodium) .Marland Kitchen.. 1 by mouth monthly 2)  Ferrex 150 150 Mg Caps (Polysaccharide iron complex) .Marland Kitchen.. 1 by mouth once daily 3)  Fish Oil 1000 Mg Caps (Omega-3 fatty acids) .Marland Kitchen.. 1 by mouth three times a day 4)  Flax Seed Oil 1000 Mg Caps (Flaxseed (linseed)) .Marland Kitchen.. 1 by mouth once daily 5)  Ocuvite  .Marland KitchenMarland Kitchen. 1 by mouth once daily 6)  Acai Friut  .Marland KitchenMarland Kitchen. 1 by mouth once daily 7)  Caltrate 600mg   .... 1 by mouth two times a day 8)  Adult Aspirin Low Strength 81 Mg Tbdp (Aspirin) .Marland Kitchen.. 1 by mouth once daily 9)  Crestor 10 Mg Tabs (Rosuvastatin calcium) .... Take one tablet daily 10)  Levsin/sl 0.125 Mg Subl (Hyoscyamine sulfate) .Marland Kitchen.. 1-2 sl q4h as needed abd cramps  Other Orders: Admin 1st Vaccine (16109) Flu Vaccine 13yrs + (60454) Flu Vaccine Consent Questions     Do you have a history of severe allergic reactions to this vaccine? no    Any prior history of allergic reactions to egg and/or gelatin? no    Do you have a sensitivity to the preservative Thimersol? no    Do you have a past history of Guillan-Barre Syndrome? no    Do you currently have an acute febrile illness? no    Have you ever had a severe reaction to latex? no    Vaccine information given and explained to patient? yes    Are you currently pregnant? no    Lot Number:[id number]   Exp Date:07/03/2009   Site Given  Left Deltoid IM Admin 1st Vaccine (09811) Flu Vaccine 22yrs + (91478)  .lbflu   EKG  Procedure date:  10/31/2008  Findings:      Normal sinus rhythm with rate of:  67 bpm

## 2010-02-03 NOTE — Progress Notes (Signed)
Summary: add-on cholesterol   Phone Note Call from Patient Call back at Home Phone [phone]   Caller: Patient Summary of Call: patient called would like to have lipid panel added to labs she had done on friday informed patient that she was due for NMR recheck 3 months from march spoke w/ Dr. Laury Axon ok w/ patient having regular lipid panel added patient aware add-on faxed to lab Initial call taken by: Doristine Devoid CMA,  September 22, 2009 8:40 AM

## 2010-02-03 NOTE — Assessment & Plan Note (Signed)
Summary: SINUS INFECTION, DEEP COUGH AND NOSE BLEED//CA   Vital Signs:  Patient Profile:   65 Years Old Female Weight:      130.6 pounds Temp:     99 degrees F oral BP sitting:   128 / 80  (left arm)  Vitals Entered By: Doristine Devoid (December 05, 2006 10:41 AM)                 PCP:  Laury Axon  Chief Complaint:  SINUS INFECTION WITH DEEP COUGH AND NOSEBLEEDS OCCASIONAL USE LORATADIEN AND GENERIC MUCINEX WITHOUT RELIEF and URI symptoms.  History of Present Illness:  URI Symptoms      This is a 65 year old woman who presents with URI symptoms.  The symptoms began duration > 3 days ago.  Pt here c/o st, congestion and cough with low grade fever since last  Tuesday.  Pt used claritin and Guiafenesin and delsym with little relief.  The patient reports nasal congestion, purulent nasal discharge, sore throat, dry cough, and sick contacts, but denies clear nasal discharge, productive cough, and earache.  Associated symptoms include low-grade fever (<100.5 degrees).  The patient denies fever, fever of 100.5-103 degrees, fever of 103.1-104 degrees, fever to >104 degrees, stiff neck, dyspnea, wheezing, rash, vomiting, diarrhea, use of an antipyretic, and response to antipyretic.  The patient also reports sneezing, seasonal symptoms, and headache.  The patient denies response to antihistamine.  The patient denies the following risk factors for Strep sinusitis: unilateral facial pain, unilateral nasal discharge, poor response to decongestant, double sickening, tooth pain, Strep exposure, tender adenopathy, and absence of cough.  + Headaches-- relieved with advil.   + nasal bleed.  Current Allergies: No known allergies      Review of Systems      See HPI   Physical Exam  General:     Well-developed,well-nourished,in no acute distress; alert,appropriate and cooperative throughout examination Head:     Normocephalic and atraumatic without obvious abnormalities. No apparent alopecia or  balding. Ears:     External ear exam shows no significant lesions or deformities.  Otoscopic examination reveals clear canals, tympanic membranes are intact bilaterally without bulging, retraction, inflammation or discharge. Hearing is grossly normal bilaterally. Nose:     mucosal erythema, L maxillary sinus tenderness, and R maxillary sinus tenderness.   Mouth:     Oral mucosa and oropharynx without lesions or exudates.  Teeth in good repair. Lungs:     Normal respiratory effort, chest expands symmetrically. Lungs are clear to auscultation, no crackles or wheezes. Heart:     Normal rate and regular rhythm. S1 and S2 normal without gallop, murmur, click, rub or other extra sounds.    Impression & Recommendations:  Problem # 1:  ACUTE SINUSITIS, UNSPECIFIED (ICD-461.9)  Her updated medication list for this problem includes:    Augmentin 875-125 Mg Tabs (Amoxicillin-pot clavulanate) .Marland Kitchen... 1 by mouth two times a day    Tussionex Pennkinetic Er 8-10 Mg/65ml Lqcr (Chlorpheniramine-hydrocodone) .Marland Kitchen... 1 tsp by mouth at bedtime prn Nasonex  2 sprays each nostril once daily  Instructed on treatment. Call if symptoms persist or worsen.   Complete Medication List: 1)  Boniva 150 Mg Tabs (Ibandronate sodium) .Marland Kitchen.. 1 by mouth monthly 2)  Ferrex 150 150 Mg Caps (Polysaccharide iron complex) .Marland Kitchen.. 1 by mouth once daily 3)  Fish Oil 1000 Mg Caps (Omega-3 fatty acids) .Marland Kitchen.. 1 by mouth three times a day 4)  Flax Seed Oil 1000 Mg Caps (Flaxseed (linseed)) .Marland KitchenMarland KitchenMarland Kitchen  1 by mouth once daily 5)  Ocuvite  .Marland KitchenMarland Kitchen. 1 by mouth once daily 6)  Acai Friut  .Marland KitchenMarland Kitchen. 1 by mouth once daily 7)  Caltrate 600mg   .... 1 by mouth two times a day 8)  Alprazolam 0.25 Mg Tabs (Alprazolam) .Marland Kitchen.. 1 by mouth three times a day as needed 9)  Adult Aspirin Low Strength 81 Mg Tbdp (Aspirin) .Marland Kitchen.. 1 by mouth once daily 10)  Ferrex 150 Forte 150-25-1 Mg-mcg-mg Caps (Iron polysacch cmplx-b12-fa) .Marland Kitchen.. 1 by mouth once daily 11)  Augmentin 875-125 Mg  Tabs (Amoxicillin-pot clavulanate) .Marland Kitchen.. 1 by mouth two times a day 12)  Tussionex Pennkinetic Er 8-10 Mg/13ml Lqcr (Chlorpheniramine-hydrocodone) .Marland Kitchen.. 1 tsp by mouth at bedtime prn     Prescriptions: TUSSIONEX PENNKINETIC ER 8-10 MG/5ML  LQCR (CHLORPHENIRAMINE-HYDROCODONE) 1 tsp by mouth at bedtime prn  #6 oz x 0   Entered and Authorized by:   Loreen Freud DO   Signed by:   Loreen Freud DO on 12/05/2006   Method used:   Print then Give to Patient   RxID:   [id number] AUGMENTIN 875-125 MG  TABS (AMOXICILLIN-POT CLAVULANATE) 1 by mouth two times a day  #20 x 0   Entered and Authorized by:   Loreen Freud DO   Signed by:   Loreen Freud DO on 12/05/2006   Method used:   [facility] ...       [facility] [phone]*       [address]       [place], [place]  [address]       Ph: [phone]       Fax: [phone]   RxID:   [id number]  ]

## 2010-02-03 NOTE — Progress Notes (Signed)
Summary: Results,Labs  Phone Note Outgoing Call   Summary of Call: Regarding lab results, LMTCB:  Risk of Heart attack and stroke is still very high.  Is pt doing ok with crestor every other day?  If yes try everyday.  ---recheck 3 months ----nmr, hep 272.4 Initial call taken by: Army Fossa CMA,  December 13, 2008 8:17 AM  Follow-up for Phone Call        Spoke with patient, patient will agree to increase to 1 by mouth once daily, appointment schedule to recheck labs in 3 months.  Follow-up by: Shonna Chock,  December 13, 2008 8:29 AM    New/Updated Medications: CRESTOR 5 MG TABS (ROSUVASTATIN CALCIUM) 1 by mouth once daily

## 2010-02-03 NOTE — Miscellaneous (Signed)
Summary: Orders Update pft charges  Clinical Lists Changes  Orders: Added new Service order of Carbon Monoxide diffusing w/capacity (94720) - Signed Added new Service order of Lung Volumes (94240) - Signed Added new Service order of Spirometry (Pre & Post) (94060) - Signed 

## 2010-02-03 NOTE — Progress Notes (Signed)
  Phone Note Call from Patient Call back at Home Phone [phone]   Caller: Patient Call For: Loreen Freud DO Summary of Call: Patient worried about ast and alt enzymes double what they are suppose to be, patient doesn't drink or take any otc meds.  Please advise. Ardyth Man  June 04, 2008 10:44 AM   Follow-up for Phone Call        liver is slightly inflamed from crestor probably but I'm watching this.  If should would like we can repeat Liver enz sooner----2 week hep, ggt, acute hep panal  790.4---  if any worse we will decrease crestor---  Follow-up by: Loreen Freud DO,  June 04, 2008 11:03 AM  Additional Follow-up for Phone Call Additional follow up Details #1::        Patient okay with waiting til 6 months since you are watching it.  Ardyth Man  June 04, 2008 11:40 AM  Additional Follow-up by: Ardyth Man,  June 04, 2008 11:40 AM

## 2010-02-03 NOTE — Progress Notes (Signed)
Summary: COPIES OF EARLIER LAB REPORTS   Phone Note Call from Patient Call back at [facility] Phone [phone]   Summary of Call: DR LOWNE PATIENT  PATIENT BROUGHT IN COPY OF OUR LAB WORK DATED 10/21/06 FOR DR LOWNE TO COMPARE WITH RESULTS OF LAB WORK TAKEN ON 11/11/06--ALSO ATTACHED WERE [facility] RESULTS FROM AUGUST 2008--PATIENT STATES THAT WHEN SHE WAS IN BEFORE, WE WERE UNABLE TO LOCATE HER LAB TEST RESULTS, SO SHE HAS MADE A COPY OF THE ONES THAT WE MAILED TO HER AND SHE IS BRINGING THIS COPY FOR DR LOWNE'S ATTENTION  WILL ATTACH TO CHART AND TAKE BACK TO CHRAE'S DESK Initial call taken by: Jerolyn Shin,  November 14, 2006 4:25 PM  Follow-up for Phone Call        see labs Follow-up by: Loreen Freud DO,  November 15, 2006 12:40 PM

## 2010-02-03 NOTE — Progress Notes (Signed)
Summary: ? med  Phone Note Call from Patient Call back at [facility] Phone [phone]   Caller: Patient Summary of Call: Pt left VM  that she would like to know if she is able to take a anti-histamine along with the Qvar that she is currently taking. pt c/o cough and some sinus drainage.........Marland KitchenFelecia Deloach CMA  January 08, 2009 9:17 AM   Follow-up for Phone Call        yes---a plain antihistamine---  claritin , zyrtec or benadryl Follow-up by: Loreen Freud DO,  January 08, 2009 9:32 AM  Additional Follow-up for Phone Call Additional follow up Details #1::        pt aware, will keep pending OV today..................Marland KitchenFelecia Deloach CMA  January 08, 2009 10:06 AM

## 2010-02-03 NOTE — Progress Notes (Signed)
Summary: Spacer  Phone Note Call from Patient   Summary of Call: Pt called and left a voicemail, she was here yesterday and you refilled her Qvar. She would like an rx for a Sp[facility], is there a certain type of spacer that needs to be sent in? Please advise. Army Fossa CMA  March 05, 2009 9:03 AM   Follow-up for Phone Call        no it doesn't matter--- cheapest is fine Follow-up by: Loreen Freud DO,  March 05, 2009 11:04 AM  Additional Follow-up for Phone Call Additional follow up Details #1::        left message for pt that we sent in rx for spacer. Army Fossa CMA  March 05, 2009 11:19 AM     New/Updated Medications: MICROSPACER  MISC (SPACER/AERO-HOLDING CHAMBERS) as directed. Prescriptions: MICROSPACER  MISC (SPACER/AERO-HOLDING CHAMBERS) as directed.  #1 x 0   Entered by:   Army Fossa CMA   Authorized by:   Loreen Freud DO   Signed by:   Army Fossa CMA on 03/05/2009   Method used:   [facility]ronically to        [facility] [phone]* (retail)       [address]       [place], [place]  [address]       Ph: [phone]       Fax: [phone]   RxID:   [id number]

## 2010-02-03 NOTE — Letter (Signed)
Summary: Results Follow-up Letter  [place] [facility]  [address] [place], [place] [address]   Phone: [phone]  Fax: [phone]    05/27/2008         [name] [name] [address] [place], [place]  [address]  Dear Ms. Kocurek,   The following are the results of your recent test(s):  Test     Result     Pap Smear    Normal_______  Not Normal_____       Comments: _________________________________________________________ Cholesterol LDL(Bad cholesterol):          Your goal is less than:         HDL (Good cholesterol):        Your goal is more than: _________________________________________________________ Other Tests:   _________________________________________________________  Please call for an appointment Or _Please see attached labwork.________________________________________________________ _________________________________________________________ _________________________________________________________  Sincerely,  Ardyth Man [place] at [facility]

## 2010-02-03 NOTE — Progress Notes (Signed)
Summary: referral  Phone Note Call from Patient   Summary of Call: Pt is interested in seeing a pulmonary doctor now, is this still possible.  Initial call taken by: Army Fossa CMA,  December 18, 2008 10:43 AM  Follow-up for Phone Call        done Follow-up by: Loreen Freud DO,  December 18, 2008 12:14 PM

## 2010-02-03 NOTE — Progress Notes (Signed)
  Phone Note Outgoing Call Call back at [facility] Phone [phone]   Call placed by: Ardyth Man,  March 05, 2008 10:22 AM Call placed to: Patient Summary of Call: Pt is at very high risk of heart attack and strok---needs medication----Crestor 10 mg #30 1 by mouth at bedtime #30  , 2refills and recheck labs 3 months---nmr, hep 272.4  Follow-up for Phone Call        Patient aware and samples placed up front. Ardyth Man  March 05, 2008 10:26 AM  Follow-up by: Ardyth Man,  March 05, 2008 10:26 AM    New/Updated Medications: CRESTOR 10 MG TABS (ROSUVASTATIN CALCIUM) Take one tablet daily   Prescriptions: CRESTOR 10 MG TABS (ROSUVASTATIN CALCIUM) Take one tablet daily  #30 x 2   Entered by:   Ardyth Man   Authorized by:   Loreen Freud DO   Signed by:   Ardyth Man on 03/05/2008   Method used:   [facility]ronically to        [facility] [phone]* (retail)       [address]       [place], [place]  [address]       Ph: [phone]       Fax: [phone]   RxID:   [id number]

## 2010-02-03 NOTE — Progress Notes (Signed)
   Phone Note Call from Patient   Caller: Patient Reason for Call: Acute Illness Summary of Call: dr. Laury Axon pt on the phone pt has been having chest pain for two weeks now. she said that she has also been sob. when she does get her breath she coughs. Azyiah also said that her cholestral has been level for a while. spoke with the nurse.  Initial call taken by: Charolette Child,  October 20, 2006 1:14 PM  Follow-up for Phone Call        per chrae, pt can be worked in or urgent care. informed the pt what the nurse suggested. pt is coming into the office. ..................................................................Marland KitchenCharolette Child  October 20, 2006 1:19 PM

## 2010-02-03 NOTE — Progress Notes (Signed)
Summary: shingles vaccine  Phone Note Call from Patient   Caller: Patient Summary of Call: pt called wants to know can she get shingles vacine?  pt informed vaccine here on back order, per Dr Laury Axon can call pharmacy and fine out. pt agreed Initial call taken by: Kandice Hams,  September 25, 2008 10:22 AM

## 2010-02-03 NOTE — Assessment & Plan Note (Signed)
Summary: pt not taking crestor/cbs   Vital Signs:  Patient profile:   65 year old female Weight:      130.2 pounds Pulse rate:   80 / minute Pulse rhythm:   regular BP sitting:   112 / 72  (left arm)  Vitals Entered By: Almeta Monas CMA Duncan Dull) (September 12, 2009 1:59 PM) CC: pt wants to discuss meds, not taking crestor   History of Present Illness: Pt here to discuss meds and labs.  Pt is not taking crestor secondary to muscle aches.  Current Medications (verified): 1)  Boniva 150 Mg  Tabs (Ibandronate Sodium) .Marland Kitchen.. 1 By Mouth Monthly 2)  Ferrex 150 150 Mg  Caps (Polysaccharide Iron Complex) .Marland Kitchen.. 1 By Mouth Once Daily 3)  Fish Oil 1200 Mg Caps (Omega-3 Fatty Acids) .... Take 1 Capsule By Mouth Once A Day 4)  Flax Seed Oil 1000 Mg  Caps (Flaxseed (Linseed)) .Marland Kitchen.. 1 By Mouth Once Daily 5)  Citracal/vitamin D 250-200 Mg-Unit Tabs (Calcium Citrate-Vitamin D) .... Take 1 Tablet By Mouth Once A Day 6)  Adult Aspirin Low Strength 81 Mg  Tbdp (Aspirin) .Marland Kitchen.. 1 By Mouth Once Daily 7)  Qvar 80 Mcg/act Aers (Beclomethasone Dipropionate) .... 2 Puffs Every Morning 8)  Microspacer  Misc (Spacer/aero-[place]olding [place]) .... As Directed. 9)  Multivitamins   Tabs (Multiple Vitamin) .... Take 1 Tablet By Mouth Once A Day 10)  Vitamin D3 1000 Unit Caps (Cholecalciferol) .... Take 1 Tablet By Mouth Once A Day  Allergies (verified): No Known Drug Allergies  Past History:  Past medical, surgical, family and social histories (including risk factors) reviewed for relevance to current acute and chronic problems.  Past Medical History: Reviewed history from 05/10/2007 and no changes required. IBS GERD Osteoporosis Hyperlipidemia Current Problems:  ACUTE SINUSITIS, UNSPECIFIED (ICD-461.9) COUGH (ICD-786.2) HYPERLIPIDEMIA (ICD-272.4) OTHER SPECIFIED ANEMIAS (ICD-285.8) OSTEOPOROSIS (ICD-733.00) GERD (ICD-530.81) FAMILY HISTORY OF CAD FEMALE 1ST DEGREE RELATIVE <50 (ICD-V17.3) FAMILY HISTORY  BREAST CANCER 1ST DEGREE RELATIVE <50 (ICD-V16.3) IRRITABLE BOWEL SYNDROME, HX OF (ICD-V12.79) CHEST PAIN, ACUTE (ICD-786.50)  Past Surgical History: Reviewed history from 05/10/2007 and no changes required. Breast Augmentation    Family History: Reviewed history from 10/20/2006 and no changes required. Family History Breast cancer 1st degree relative @58  Mother Muncle-- esoph. ca Maunt- [place], Breast CA  Family History of CAD Female 1st degree relative 64--  f, [place]  Social History: Reviewed history from 04/04/2009 and no changes required. Occupation: retired from Sports coach Single Never Smoked Alcohol use-no Drug use-no Regular exercise-no  Review of Systems      See HPI  Physical Exam  General:  Well-developed,well-nourished,in no acute distress; alert,appropriate and cooperative throughout examination Lungs:  Normal respiratory effort, chest expands symmetrically. Lungs are clear to auscultation, no crackles or wheezes. Heart:  Normal rate and regular rhythm. S1 and S2 normal without gallop, murmur, click, rub or other extra sounds. Extremities:  No clubbing, cyanosis, edema, or deformity noted with normal full range of motion of all joints.   Psych:  Cognition and judgment appear intact. Alert and cooperative with normal attention span and concentration. No apparent delusions, illusions, hallucinations   Impression & Recommendations:  Problem # 1:  FATIGUE (ICD-780.79)  Orders: Venipuncture ([address]109) TLB-B12 + Folate Pnl (60454_09811-B14/NWG) TLB-BMP (Basic Metabolic Panel-BMET) (80048-METABOL) TLB-CBC Platelet - w/Differential (85025-CBCD) TLB-Hepatic/Liver Function Pnl (80076-HEPATIC) TLB-TSH (Thyroid Stimulating Hormone) (84443-TSH) Specimen Handling (95621)  Problem # 2:  MYALGIA (ICD-729.1)  Her updated medication list for this problem includes:  Adult Aspirin Low Strength 81 Mg Tbdp (Aspirin) .Marland Kitchen... 1 by mouth once daily  Problem # 3:  HYPERLIPIDEMIA  (ICD-272.4)  The following medications were removed from the medication list:    Crestor 5 Mg Tabs (Rosuvastatin calcium) .Marland Kitchen... 1 by mouth mwf  Labs Reviewed: SGOT: 19 (03/14/2009)   SGPT: 15 (03/14/2009)   HDL:57.40 (05/14/2008), 71.3 (11/11/2006)  LDL:80 (05/14/2008), DEL (16/10/9602)  Chol:155 (05/14/2008), 246 (11/11/2006)  Trig:89.0 (05/14/2008), 57 (11/11/2006)  Problem # 4:  FATIGUE (ICD-780.79)  Complete Medication List: 1)  Boniva 150 Mg Tabs (Ibandronate sodium) .Marland Kitchen.. 1 by mouth monthly 2)  Ferrex 150 150 Mg Caps (Polysaccharide iron complex) .Marland Kitchen.. 1 by mouth once daily 3)  Fish Oil 1200 Mg Caps (Omega-3 fatty acids) .... Take 1 capsule by mouth once a day 4)  Flax Seed Oil 1000 Mg Caps (Flaxseed (linseed)) .Marland Kitchen.. 1 by mouth once daily 5)  Citracal/vitamin D 250-200 Mg-unit Tabs (Calcium citrate-vitamin d) .... Take 1 tablet by mouth once a day 6)  Adult Aspirin Low Strength 81 Mg Tbdp (Aspirin) .Marland Kitchen.. 1 by mouth once daily 7)  Qvar 80 Mcg/act Aers (Beclomethasone dipropionate) .... 2 puffs every morning 8)  Microspacer Misc (Spacer/aero-holding chambers) .... As directed. 9)  Multivitamins Tabs (Multiple vitamin) .... Take 1 tablet by mouth once a day 10)  Vitamin D3 1000 Unit Caps (Cholecalciferol) .... Take 1 tablet by mouth once a day  Patient Instructions: 1)  Please schedule a follow-up appointment in 3 months .  2)  Please return for lab work one(1) week before your next appointment. ---272.4  hep, lipid

## 2010-02-03 NOTE — Progress Notes (Signed)
   Phone Note Outgoing Call   Call placed by: Shonna Chock,  October 24, 2006 4:37 PM Summary of Call: [facility] FOR CALL WAS TO DISCUSS RECENT LABS.  Follow-up for Phone Call        d/w patient, patient is taking iro ferrex 150mg  once daily (prescribed by another dr.) patient scheduled appointment for 11/02/2006. @ that time patient will discuss stress test and recheck labs with additional iron studies Follow-up by: Shonna Chock,  October 25, 2006 11:18 AM         Appended Document: Orders Update     Clinical Lists Changes  Orders: Added new Referral order of Cardiology Referral (Cardiology) - Signed  Appended Document:  Chrae please let pt. know that Dr. Blossom Hoops is referring her to cardiology and has already been ordered.  Helmut Muster is aware. Thanks, Marcelino Duster  Appended Document:  PATIENT AWARE

## 2010-02-03 NOTE — Assessment & Plan Note (Signed)
Summary: dizziness and chol//ph   Vital Signs:  Patient Profile:   65 Years Old Female Weight:      135.38 pounds Temp:     98.6 degrees F oral Pulse rate:   72 / minute Resp:     14 per minute BP sitting:   118 / 72  (right arm)  Pt. in pain?   no  Vitals Entered By: Ardyth Man (February 20, 2008 10:56 AM)                  PCP:  Laury Axon  Chief Complaint:  Discuss cholesterol issues in the past and bloodwork and would like to see a nutritionist.  History of Present Illness: Pt here to discuss cholesterol and would like to go to a nutritionist.   Pt still c/o some  fatigue ---retired now and has no desire to exercise or do anything.      Current Allergies: No known allergies   Past Medical History:    Reviewed history from 05/10/2007 and no changes required:       IBS       GERD       Osteoporosis       Hyperlipidemia       Current Problems:        ACUTE SINUSITIS, UNSPECIFIED (ICD-461.9)       COUGH (ICD-786.2)       HYPERLIPIDEMIA (ICD-272.4)       OTHER SPECIFIED ANEMIAS (ICD-285.8)       OSTEOPOROSIS (ICD-733.00)       GERD (ICD-530.81)       FAMILY HISTORY OF CAD FEMALE 1ST DEGREE RELATIVE <50 (ICD-V17.3)       FAMILY HISTORY BREAST CANCER 1ST DEGREE RELATIVE <50 (ICD-V16.3)       IRRITABLE BOWEL SYNDROME, HX OF (ICD-V12.79)       CHEST PAIN, ACUTE (ICD-786.50)   Family History:    Reviewed history from 10/20/2006 and no changes required:       Family History Breast cancer 1st degree relative @58  Mother       Muncle-- esoph. ca       Maunt- [place], Breast CA              Family History of CAD Female 1st degree relative 47--  f, [place]  Social History:    Reviewed history from 10/20/2006 and no changes required:       Occupation: Sports coach       Single       Never Smoked       Alcohol use-no       Drug use-no       Regular exercise-no   Risk Factors: Tobacco use:  never Passive smoke exposure:  no Drug use:  no HIV high-risk behavior:   no Alcohol use:  no Exercise:  no  Colonoscopy History:    Date of Last Colonoscopy:  11/05/1998   Review of Systems      See HPI   Physical Exam  General:     Well-developed,well-nourished,in no acute distress; alert,appropriate and cooperative throughout examination Lungs:     Normal respiratory effort, chest expands symmetrically. Lungs are clear to auscultation, no crackles or wheezes. Heart:     normal rate, regular rhythm, and no murmur.   Extremities:     No clubbing, cyanosis, edema, or deformity noted with normal full range of motion of all joints.   Skin:     Intact without suspicious lesions or rashes Psych:  Cognition and judgment appear intact. Alert and cooperative with normal attention span and concentration. No apparent delusions, illusions, hallucinations    Impression & Recommendations:  Problem # 1:  HYPERLIPIDEMIA (ICD-272.4)  Orders: Venipuncture (21308) TLB-Hepatic/Liver Function Pnl (80076-HEPATIC) TLB-TSH (Thyroid Stimulating Hormone) (84443-TSH) TLB-B12 + Folate Pnl (65784_69629-B28/UXL) TLB-BMP (Basic Metabolic Panel-BMET) (80048-METABOL) TLB-CBC Platelet - w/Differential (85025-CBCD) T-Lipoprotein blood, by NMR  (24401-02725) Nutrition Referral (Nutrition)  Labs Reviewed: Chol: 246 (11/11/2006)   HDL: 71.3 (11/11/2006)   LDL: 164.3 (11/11/2006)   TG: 57 (11/11/2006) SGOT: 18 (11/11/2006)   SGPT: 12 (11/11/2006)   Problem # 2:  DIZZINESS (ICD-780.4)  Orders: TLB-B12 + Folate Pnl (36644_03474-Q59/DGL) TLB-BMP (Basic Metabolic Panel-BMET) (80048-METABOL) TLB-CBC Platelet - w/Differential (85025-CBCD) T-Lipoprotein blood, by NMR  (87564-33295) Demonstrated maneuvers to self-treat vertigo. Patient to call to be seen if no improvement in 10-14 days, sooner if worse.   Complete Medication List: 1)  Boniva 150 Mg Tabs (Ibandronate sodium) .Marland Kitchen.. 1 by mouth monthly 2)  Ferrex 150 150 Mg Caps (Polysaccharide iron complex) .Marland Kitchen.. 1 by mouth  once daily 3)  Fish Oil 1000 Mg Caps (Omega-3 fatty acids) .Marland Kitchen.. 1 by mouth three times a day 4)  Flax Seed Oil 1000 Mg Caps (Flaxseed (linseed)) .Marland Kitchen.. 1 by mouth once daily 5)  Ocuvite  .Marland KitchenMarland Kitchen. 1 by mouth once daily 6)  Acai Friut  .Marland KitchenMarland Kitchen. 1 by mouth once daily 7)  Caltrate 600mg   .... 1 by mouth two times a day 8)  Adult Aspirin Low Strength 81 Mg Tbdp (Aspirin) .Marland Kitchen.. 1 by mouth once daily

## 2010-02-03 NOTE — Assessment & Plan Note (Signed)
Summary: recheck labs, discuss stress test/scm   Vital Signs:  Patient Profile:   65 Years Old Female Weight:      126.0 pounds Temp:     98.6 degrees F oral Pulse rate:   64 / minute BP sitting:   122 / 84  (left arm) Cuff size:   large  Vitals Entered By: Shonna Chock (November 11, 2006 12:20 PM)                 PCP:  Laury Axon  Chief Complaint:  DISCUSS STRESS TEST and F/U ON LABS. COUGH X 1 MONTH. DISCUSS FERREX.  History of Present Illness: [name] [name]  here to go over stress test and labs.   [name] [name] also c/o cough, dry--- with no other symptoms-- she is still have chest pains--- on and off.  No SOB.  Current Allergies: No known allergies   Past Medical History:    IBS    GERD    Osteoporosis    Hyperlipidemia     Review of Systems      See HPI   Physical Exam  General:     Well-developed,well-nourished,in no acute distress; alert,appropriate and cooperative throughout examination Ears:     External ear exam shows no significant lesions or deformities.  Otoscopic examination reveals clear canals, tympanic membranes are intact bilaterally without bulging, retraction, inflammation or discharge. Hearing is grossly normal bilaterally. Nose:     External nasal examination shows no deformity or inflammation. Nasal mucosa are pink and moist without lesions or exudates. Mouth:     Oral mucosa and oropharynx without lesions or exudates.  Teeth in good repair. Lungs:     Normal respiratory effort, chest expands symmetrically. Lungs are clear to auscultation, no crackles or wheezes. Heart:     Normal rate and regular rhythm. S1 and S2 normal without gallop, murmur, click, rub or other extra sounds. Abdomen:     Bowel sounds positive,abdomen soft and non-tender without masses, organomegaly or hernias noted.    Impression & Recommendations:  Problem # 1:  OTHER SPECIFIED ANEMIAS (ICD-285.8)  Her updated medication list for this problem includes:    Ferrex 150 150 Mg Caps  (Polysaccharide iron complex) .Marland Kitchen... 1 by mouth once daily    Ferrex 150 Forte 150-25-1 Mg-mcg-mg Caps (Iron polysacch cmplx-b12-fa) .Marland Kitchen... 1 by mouth once daily  Orders: Venipuncture (52841) TLB-CBC Platelet - w/Differential (85025-CBCD) TLB-Iron, (Fe) Total (83540-FE) TLB-Ferritin (82728-FER) Hgb: 10.7 (10/20/2006)   Hct: 30.8 (10/20/2006)   RDW: 11.6 (10/20/2006)   MCV: 93.7 (10/20/2006)   MCHC: 34.7 (10/20/2006) TSH: 1.24 (10/20/2006)   Problem # 2:  HYPERLIPIDEMIA (ICD-272.4)  Orders: TLB-Lipid Panel (80061-LIPID) TLB-Hepatic/Liver Function Pnl (80076-HEPATIC)   Problem # 3:  CHEST PAIN, ACUTE (ICD-786.50) stress done cardio pending  Problem # 4:  COUGH (ICD-786.2) ? Gerd=---  nexium 40 mg once daily   Medications Added to Medication List This Visit: 1)  Adult Aspirin Low Strength 81 Mg Tbdp (Aspirin) .Marland Kitchen.. 1 by mouth once daily 2)  Ferrex 150 Forte 150-25-1 Mg-mcg-mg Caps (Iron polysacch cmplx-b12-fa) .Marland Kitchen.. 1 by mouth once daily  Complete Medication List: 1)  Boniva 150 Mg Tabs (Ibandronate sodium) .Marland Kitchen.. 1 by mouth monthly 2)  Ferrex 150 150 Mg Caps (Polysaccharide iron complex) .Marland Kitchen.. 1 by mouth once daily 3)  Fish Oil 1000 Mg Caps (Omega-3 fatty acids) .Marland Kitchen.. 1 by mouth three times a day 4)  Flax Seed Oil 1000 Mg Caps (Flaxseed (linseed)) .Marland Kitchen.. 1 by mouth once daily 5)  Ocuvite  .Marland KitchenMarland KitchenMarland Kitchen  1 by mouth once daily 6)  Acai Friut  .Marland KitchenMarland Kitchen. 1 by mouth once daily 7)  Caltrate 600mg   .... 1 by mouth two times a day 8)  Alprazolam 0.25 Mg Tabs (Alprazolam) .Marland Kitchen.. 1 by mouth three times a day as needed 9)  Adult Aspirin Low Strength 81 Mg Tbdp (Aspirin) .Marland Kitchen.. 1 by mouth once daily 10)  Ferrex 150 Forte 150-25-1 Mg-mcg-mg Caps (Iron polysacch cmplx-b12-fa) .Marland Kitchen.. 1 by mouth once daily     Prescriptions: FERREX 150 FORTE 150-25-1 MG-MCG-MG  CAPS (IRON POLYSACCH CMPLX-B12-FA) 1 by mouth once daily  #30 x 11   Entered and Authorized by:   Loreen Freud DO   Signed by:   Loreen Freud DO on  11/11/2006   Method used:   Electronically sent to ...       [facility] [phone][facility]       [address]       [place], [place]  [address]       Ph: [phone]       Fax: [phone]   RxID:   [id number]  ]

## 2010-02-03 NOTE — Assessment & Plan Note (Signed)
Summary: cough/kdc   Vital Signs:  Patient profile:   65 year old female Height:      63 inches Weight:      138.25 pounds BMI:     24.58 Temp:     97.9 degrees F oral BP sitting:   120 / 78  Vitals Entered By: Kandice Hams (January 08, 2009 11:36 AM) CC: c/o cough x 5 days, cannot sleep due to cough nonproductive   Primary Care Provider:  Laury Axon  CC:  c/o cough x 5 days and cannot sleep due to cough nonproductive.  History of Present Illness: 65 year old female with c/o cough x 1 month, but worse since saturday.  Now having trouble sleeping at night due to cough,  has used Delsym without relief.  Notes that her cough is non-productive.  Notes + sinus drainage- notes clear nasal discharge.  Denies sinus pressure.    Patient was seen 5 weeks ago for c/o DOE and cough- notes that qvar seems to helped a little bit with her SOB  Allergies: No Known Drug Allergies  Review of Systems       Denies fever   Impression & Recommendations:  Problem # 1:  COUGH (ICD-786.2) Assessment New I suspect that patient's cough is due to post nasal drip.  May also need to consider GERD and addition of PPI if nasal steroid does not help patient's symptoms. Patient has appointment with pulmonary on 1/10- will add nasal steroid trial as well as tessalon for cough.  Pt declines to do CXR at this time- I instructed patient to call if symptoms worsen, do not improve or if she develops fever over 101  Problem # 2:  MYALGIA (ICD-729.1) Assessment: Unchanged  will discontinue crestor- still having some myalgia despite decreasing dose,   Her updated medication list for this problem includes:    Adult Aspirin Low Strength 81 Mg Tbdp (Aspirin) .Marland Kitchen... 1 by mouth once daily  Orders: TLB-CK Total Only(Creatine Kinase/CPK) (82550-CK)  Complete Medication List: 1)  Boniva 150 Mg Tabs (Ibandronate sodium) .Marland Kitchen.. 1 by mouth monthly 2)  Ferrex 150 150 Mg Caps (Polysaccharide iron complex) .Marland Kitchen.. 1 by mouth once  daily 3)  Fish Oil 1000 Mg Caps (Omega-3 fatty acids) .Marland Kitchen.. 1 by mouth three times a day 4)  Flax Seed Oil 1000 Mg Caps (Flaxseed (linseed)) .Marland Kitchen.. 1 by mouth once daily 5)  Ocuvite  .Marland KitchenMarland Kitchen. 1 by mouth once daily 6)  Acai Friut  .Marland KitchenMarland Kitchen. 1 by mouth once daily 7)  Caltrate 600mg   .... 1 by mouth two times a day 8)  Adult Aspirin Low Strength 81 Mg Tbdp (Aspirin) .Marland Kitchen.. 1 by mouth once daily 9)  Qvar 80 Mcg/act Aers (Beclomethasone dipropionate) .... 2 puffs two times a day. 10)  Crestor 5 Mg Tabs (Rosuvastatin calcium) .Marland Kitchen.. 1 by mouth once daily 11)  Fluticasone Propionate 50 Mcg/act Susp (Fluticasone propionate) .... Two sprays each nostril daily 12)  Tessalon Perles 100 Mg Caps (Benzonatate) .... One tablet by mouth three times a day as needed cough  Other Orders: T-2 View CXR (71020TC)  Patient Instructions: 1)  Stop crestor for 2 weeks then follow up with Dr. Laury Axon for further instructions 2)  Keep your appointment with pulmonary as scheduled.   3)  Call if fever over 101, symptoms worsen or do not improve  Prescriptions: TESSALON PERLES 100 MG CAPS (BENZONATATE) one tablet by mouth three times a day as needed cough  #30 x 0   Entered and  Authorized by:   Lemont Fillers FNP   Signed by:   Lemont Fillers FNP on 01/08/2009   Method used:   Electronically to        [facility] [phone]* (retail)       [address]       [place], [place]  [address]       Ph: [phone]       Fax: [phone]   RxID:   [id number] FLUTICASONE PROPIONATE 50 MCG/ACT SUSP (FLUTICASONE PROPIONATE) two sprays each nostril daily  #1 x 1   Entered and Authorized by:   Lemont Fillers FNP   Signed by:   Lemont Fillers FNP on 01/08/2009   Method used:   Electronically to        [facility] [phone]* (retail)       [address]       [place], [place]  [address]       Ph: [phone]       Fax: [phone]   RxID:    [id number]

## 2010-02-03 NOTE — Assessment & Plan Note (Signed)
Summary: FOR MED REFILL/PH   Vital Signs:  Patient profile:   65 year old female Weight:      134 pounds Temp:     98.1 degrees F oral Pulse rate:   82 / minute Pulse rhythm:   regular BP sitting:   120 / 80  (left arm) Cuff size:   regular  Vitals Entered By: Army Fossa CMA (March 04, 2009 4:10 PM) CC: Pt here for med refills. Comments Needs refills on- qvar,boniva.   History of Present Illness: Pt here for refill on qvar and boniva.  Pt had to cancel pulm appoint secondary to problem with insurance but she will reschedule appointment.     Allergies (verified): No Known Drug Allergies  Physical Exam  General:  Well-developed,well-nourished,in no acute distress; alert,appropriate and cooperative throughout examination Neck:  No deformities, masses, or tenderness noted. Lungs:  Normal respiratory effort, chest expands symmetrically. Lungs are clear to auscultation, no crackles or wheezes. Heart:  normal rate and no murmur.   Extremities:  No clubbing, cyanosis, edema, or deformity noted with normal full range of motion of all joints.   Psych:  normally interactive and good eye contact.     Impression & Recommendations:  Problem # 1:  DYSPNEA ON EXERTION (ICD-786.09) pt to reschedule pulm appointment Her updated medication list for this problem includes:    Qvar 80 Mcg/act Aers (Beclomethasone dipropionate) .Marland Kitchen... 2 puffs two times a day  Problem # 2:  OSTEOPOROSIS (ICD-733.00)  Her updated medication list for this problem includes:    Boniva 150 Mg Tabs (Ibandronate sodium) .Marland Kitchen... 1 by mouth monthly  Bone Density: +osteopenic (09/26/2007)  Complete Medication List: 1)  Boniva 150 Mg Tabs (Ibandronate sodium) .Marland Kitchen.. 1 by mouth monthly 2)  Ferrex 150 150 Mg Caps (Polysaccharide iron complex) .Marland Kitchen.. 1 by mouth once daily 3)  Fish Oil 1000 Mg Caps (Omega-3 fatty acids) .Marland Kitchen.. 1 by mouth three times a day 4)  Flax Seed Oil 1000 Mg Caps (Flaxseed (linseed)) .Marland Kitchen.. 1 by mouth  once daily 5)  Ocuvite  .Marland KitchenMarland Kitchen. 1 by mouth once daily 6)  Acai Friut  .Marland KitchenMarland Kitchen. 1 by mouth once daily 7)  Caltrate 600mg   .... 1 by mouth two times a day 8)  Adult Aspirin Low Strength 81 Mg Tbdp (Aspirin) .Marland Kitchen.. 1 by mouth once daily 9)  Qvar 80 Mcg/act Aers (Beclomethasone dipropionate) .... 2 puffs two times a day. 10)  Crestor 5 Mg Tabs (Rosuvastatin calcium) .Marland Kitchen.. 1 by mouth mwf Prescriptions: BONIVA 150 MG  TABS (IBANDRONATE SODIUM) 1 by mouth MONTHLY  #3 x 3   Entered and Authorized by:   Loreen Freud DO   Signed by:   Loreen Freud DO on 03/04/2009   Method used:   Electronically to        [facility] [phone]* (retail)       [address]       [place], [place]  [address]       Ph: [phone]       Fax: [phone]   RxID:   [id number] QVAR 80 MCG/ACT AERS (BECLOMETHASONE DIPROPIONATE) 2 puffs two times a day.  #3 x 3   Entered and Authorized by:   Loreen Freud DO   Signed by:   Loreen Freud DO on 03/04/2009   Method used:   Electronically to        [facility] [phone]* (retail)  [address]       [place], [place]  [address]       Ph: [phone]       Fax: [phone]   RxID:   [id number]

## 2010-02-03 NOTE — Letter (Signed)
Summary: Primary Care Consult Scheduled Letter  [facility]  [address] [place], [place] [address]   Phone: [phone]  Fax: [phone]      10/31/2008 MRN: [id number]  [facility] [address] [place], [place]  [address]    Dear Ms. Voong,    We have scheduled an appointment for you.  At the recommendation of Dr. Loreen Freud, we have scheduled you for a Pulmonary Function Test with [facility] on 11-08-08 at 10:00am.  Their address is [address] [place], [place] [address]. The office phone number is [phone].  If this appointment day and time is not convenient for you, please feel free to call the office of the doctor you are being referred to at the number listed above and reschedule the appointment.    It is important for you to keep your scheduled appointments. We are here to make sure you are given good patient care.   Thank you,    Renee, Patient Care Coordinator [facility]    **PLEASE READ ATTACHED INSTRUCTIONS**

## 2010-02-03 NOTE — Miscellaneous (Signed)
  Clinical Lists Changes  Observations: Added new observation of DEXANXTDUE: 10/2009 (10/04/2007 9:09) Added new observation of BONE DENSITY: +osteopenic (09/26/2007 9:09)      Preventive Care Screening  Bone Density:    Date:  09/26/2007    Next Due:  10/2009    Results:  +osteopenic

## 2010-02-03 NOTE — Progress Notes (Signed)
Summary: LABS RESULTS (lmom 9/19, 9/21)  Phone Note Outgoing Call   Call placed by: [facility] CMA,  September 22, 2009 2:27 PM Details for Reason: Ideally your LDL (bad cholesterol) should be <_100___, your HDL (good cholesterol) should be >_40__ and your triglycerides should be< 150.  Diet and exercise will increase HDL and decrease the LDL and triglycerides. Read Dr. Danice Goltz book--Eat Drink and Be Healthy.  Try Welchol capsules-- 2 by mouth  two times a day #120  2 refills---she can try samples first.   We will recheck labs in _3__ months.  272.4  lipid, hep   Summary of Call: left message to call office.............................Marland KitchenFelecia Deloach CMA  September 22, 2009 2:27 PM   lmtcb.Harold Barban  September 24, 2009 11:36 AM   Left message to call back Almeta Monas CMA Duncan Dull)  September 24, 2009 1:28 PM   Follow-up for Phone Call        spoke w/ patient aware of labs and prescription to be started samples and prescription place up front for pick up...Marland KitchenMarland KitchenDoristine Devoid CMA  September 25, 2009 8:23 AM     New/Updated Medications: WELCHOL 625 MG TABS (COLESEVELAM HCL) 2 tablets two times a day Prescriptions: WELCHOL 625 MG TABS (COLESEVELAM HCL) 2 tablets two times a day  #120 x 2   Entered by:   Doristine Devoid CMA   Authorized by:   Loreen Freud DO   Signed by:   Doristine Devoid CMA on 09/25/2009   Method used:   Print then Give to Patient   RxID:   [id number]

## 2010-02-03 NOTE — Progress Notes (Signed)
Summary: phone-respiratory issues  Phone Note Call from Patient   Caller: Patient Summary of Call: Patient would like to be seen she states she is having respiratory issues. Patient also states she has had this issue for a long time. Patient has an appt on 10/31/2008. Per Duwayne Heck told patient to go the emergency room if it gets worse. Initial call taken by: Barb Merino,  October 30, 2008 9:48 AM

## 2010-02-03 NOTE — Progress Notes (Signed)
Summary: lowne--refill  Phone Note Refill Request   Refills Requested: Medication #1:  Tussionex Pennkinetic susp mpi [facility]ph-[phone] [phone]  Initial call taken by: Freddy Jaksch,  May 18, 2007 12:45 PM  Follow-up for Phone Call        Denied, patient needs ov Ardyth Man  May 18, 2007 12:48 PM Left message on machine for patient to call office if she is still having problems she will need to be seen. Ardyth Man  May 18, 2007 12:48 PM

## 2010-02-03 NOTE — Progress Notes (Signed)
  Phone Note Outgoing Call Call back at [facility] Phone [phone]   Call placed by: Ardyth Man,  May 18, 2007 12:48 PM Call placed to: Patient Summary of Call: Left message for patient to call the office if she is having problems still she will need to be seen. Ardyth Man  May 18, 2007 12:49 PM

## 2010-02-03 NOTE — Assessment & Plan Note (Signed)
Summary: cold and cough///sph   Vital Signs:  Patient profile:   65 year old female Weight:      130 pounds BMI:     23.11 Temp:     99.7 degrees F oral Pulse rate:   92 / minute Resp:     16 per minute BP sitting:   126 / 84  (left arm) Cuff size:   regular  Vitals Entered By: Shonna Chock CMA (November 07, 2009 2:10 PM) CC: Cold symptoms x 24 hours , URI symptoms   Primary Care Provider:  Laury Axon  CC:  Cold symptoms x 24 hours  and URI symptoms.  History of Present Illness: Onset 11/01 as ST , sneezing ,rhinitis  & rattly cough. The patient reports nasal congestion, clear nasal discharge, sore throat, dry cough, and  bilateral earache, but denies purulent nasal discharge.  Associated symptoms include low-grade fever (<100.5 degrees).  The patient denies dyspnea and wheezing.  The patient also reports frontal  headache & bilateral facial pain and tooth pain.  The patient denies the following risk factors for Strep sinusitis: tender adenopathy. Rx: lsft over Clindamycin & Delsym.   Current Medications (verified): 1)  Boniva 150 Mg  Tabs (Ibandronate Sodium) .Marland Kitchen.. 1 By Mouth Monthly 2)  Ferrex 150 150 Mg  Caps (Polysaccharide Iron Complex) .Marland Kitchen.. 1 By Mouth Once Daily 3)  Fish Oil 1200 Mg Caps (Omega-3 Fatty Acids) .... Take 1 Capsule By Mouth Once A Day 4)  Flax Seed Oil 1000 Mg  Caps (Flaxseed (Linseed)) .Marland Kitchen.. 1 By Mouth Once Daily 5)  Citracal/vitamin D 250-200 Mg-Unit Tabs (Calcium Citrate-Vitamin D) .... Take 1 Tablet By Mouth Once A Day 6)  Adult Aspirin Low Strength 81 Mg  Tbdp (Aspirin) .Marland Kitchen.. 1 By Mouth Once Daily 7)  Multivitamins   Tabs (Multiple Vitamin) .... Take 1 Tablet By Mouth Once A Day 8)  Vitamin D3 1000 Unit Caps (Cholecalciferol) .... Take 1 Tablet By Mouth Once A Day 9)  Welchol 625 Mg Tabs (Colesevelam Hcl) .... 2 Tablets Two Times A Day  Allergies (verified): No Known Drug Allergies  Physical Exam  General:  in no acute distress; alert,appropriate and  cooperative throughout examination Ears:  External ear exam shows no significant lesions or deformities.  Otoscopic examination reveals clear canals, tympanic membranes are intact bilaterally without bulging, retraction, inflammation or discharge. Hearing is grossly normal bilaterally. TMs dull Nose:  External nasal examination shows no deformity or inflammation. Nasal mucosa are  dry & erythematous esp R; without lesions or exudates.. Hyponasal speech Mouth:  Oral mucosa and oropharynx without lesions or exudates.  Slight pharyngeal erythema.   Lungs:  Normal respiratory effort, chest expands symmetrically. Lungs are clear to auscultation, no crackles or wheezes. Heart:  Normal rate and regular rhythm. S1 and S2 normal without gallop, murmur, click, rub. Cervical Nodes:   Small L lymphadenopathy noted posteriorly  Axillary Nodes:  No palpable lymphadenopathy   Impression & Recommendations:  Problem # 1:  BRONCHITIS-ACUTE (ICD-466.0)  The following medications were removed from the medication list:    Qvar 80 Mcg/act Aers (Beclomethasone dipropionate) .Marland Kitchen... 2 puffs every morning Her updated medication list for this problem includes:    Azithromycin 250 Mg Tabs (Azithromycin) .Marland Kitchen... As per pack    Hydromet 5-1.5 Mg/61ml Syrp (Hydrocodone-homatropine) .Marland Kitchen... 1 tsp every 6 hrs as needed  Problem # 2:  URI (ICD-465.9)  Her updated medication list for this problem includes:    Adult Aspirin Low Strength 81 Mg Tbdp (  Aspirin) .Marland Kitchen... 1 by mouth once daily    Hydromet 5-1.5 Mg/68ml Syrp (Hydrocodone-homatropine) .Marland Kitchen... 1 tsp every 6 hrs as needed  Complete Medication List: 1)  Boniva 150 Mg Tabs (Ibandronate sodium) .Marland Kitchen.. 1 by mouth monthly 2)  Ferrex 150 150 Mg Caps (Polysaccharide iron complex) .Marland Kitchen.. 1 by mouth once daily 3)  Fish Oil 1200 Mg Caps (Omega-3 fatty acids) .... Take 1 capsule by mouth once a day 4)  Flax Seed Oil 1000 Mg Caps (Flaxseed (linseed)) .Marland Kitchen.. 1 by mouth once daily 5)   Citracal/vitamin D 250-200 Mg-unit Tabs (Calcium citrate-vitamin d) .... Take 1 tablet by mouth once a day 6)  Adult Aspirin Low Strength 81 Mg Tbdp (Aspirin) .Marland Kitchen.. 1 by mouth once daily 7)  Multivitamins Tabs (Multiple vitamin) .... Take 1 tablet by mouth once a day 8)  Vitamin D3 1000 Unit Caps (Cholecalciferol) .... Take 1 tablet by mouth once a day 9)  Welchol 625 Mg Tabs (Colesevelam hcl) .... 2 tablets two times a day 10)  Azithromycin 250 Mg Tabs (Azithromycin) .... As per pack 11)  Hydromet 5-1.5 Mg/60ml Syrp (Hydrocodone-homatropine) .Marland Kitchen.. 1 tsp every 6 hrs as needed  Patient Instructions: 1)  Neti pot once daily as needed for sinus  congestion. 2)  Drink as much  NON dairy fluid as you can tolerate for the next few days. Prescriptions: HYDROMET 5-1.5 MG/5ML SYRP (HYDROCODONE-HOMATROPINE) 1 tsp every 6 hrs as needed  #120cc x 0   Entered and Authorized by:   Marga Melnick MD   Signed by:   Marga Melnick MD on 11/07/2009   Method used:   Printed then faxed to ...       [facility] [phone]* (retail)       [address]       [place], [place]  [address]       Ph: [phone]       Fax: [phone]   RxID:   [id number] AZITHROMYCIN 250 MG TABS (AZITHROMYCIN) as per pack  #1 x 0   Entered and Authorized by:   Marga Melnick MD   Signed by:   Marga Melnick MD on 11/07/2009   Method used:   Printed then faxed to ...       [facility] [phone]* (retail)       [address]       [place], [place]  [address]       Ph: [phone]       Fax: [phone]   RxID:   [id number]    Orders Added: 1)  Est. Patient Level III [[address]]  Appended Document: cold and cough///sph RX's called in, not received by fax

## 2010-02-03 NOTE — Progress Notes (Signed)
Summary: LOWNE--REFILL  Phone Note Refill Request   Refills Requested: Medication #1:  Sandria Senter SUSP MPI [facility][phone]  Initial call taken by: Freddy Jaksch,  May 24, 2007 2:52 PM  Follow-up for Phone Call        DENIED. Patient aware needs ov before another refill. Ardyth Man  May 25, 2007 10:33 AM  Follow-up by: Ardyth Man,  May 25, 2007 10:33 AM

## 2010-02-03 NOTE — Progress Notes (Signed)
Summary: PFT result,leg pain  Phone Note Call from Patient Call back at Home Phone [phone]   Caller: Patient Summary of Call: pt left VM that she has not gotten the results of PFT test. pt states that she was told to hold crestor to see if the SOB would improve. pt states she has improved so she did not start med back. pt states that now she has pain in both legs. pt c/o joint/ muscle pain in her legs x48months. 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  Take crestor 5 mg  every other day---we can leave samples to try first. PFT  + small airway disease-----qvar 80  #1  2 puffs two times a day  Follow-up by: Loreen Freud DO,  November 25, 2008 5:15 PM  Additional Follow-up for Phone Call Additional follow up Details #1::        pt aware, refill called in.     New/Updated Medications: QVAR 80 MCG/ACT AERS (BECLOMETHASONE DIPROPIONATE) 2 puffs two times a day. CRESTOR 5 MG TABS (ROSUVASTATIN CALCIUM) 1 by mouth every other day. Prescriptions: CRESTOR 5 MG TABS (ROSUVASTATIN CALCIUM) 1 by mouth every other day.  #30 x 0   Entered by:   Army Fossa CMA   Authorized by:   Loreen Freud DO   Signed by:   Army Fossa CMA on 11/25/2008   Method used:   Electronically to        [facility] [phone]* (retail)       [address]       [place], [place]  [address]       Ph: [phone]       Fax: [phone]   RxID:   [id number] QVAR 80 MCG/ACT AERS (BECLOMETHASONE DIPROPIONATE) 2 puffs two times a day.  #1 x 0   Entered by:   Army Fossa CMA   Authorized by:   Loreen Freud DO   Signed by:   Army Fossa CMA on 11/25/2008   Method used:   Electronically to        [facility] [phone]*  (retail)       [address]       [place], [place]  [address]       Ph: [phone]       Fax: [phone]   RxID:   [id number]

## 2010-02-03 NOTE — Letter (Signed)
Summary: Results Follow-up Letter  [place] [facility]  [address] [place], [place] [address]   Phone: [phone]  Fax: [phone]    05/27/2008        Raylie Maddison [address] [place], [place]  [address]  Dear Ms. Shonk,   The following are the results of your recent test(s):  Test     Result     Pap Smear    Normal_______  Not Normal_____       Comments: _________________________________________________________ Cholesterol LDL(Bad cholesterol):          Your goal is less than:         HDL (Good cholesterol):        Your goal is more than: _________________________________________________________ Other Tests:   _________________________________________________________  Please call for an appointment Or __Please see attached labwork._______________________________________________________ _________________________________________________________ _________________________________________________________  Sincerely,  Ardyth Man [place] [facility]

## 2010-02-03 NOTE — Assessment & Plan Note (Signed)
Summary: follow for chol check and sinus--ph   Vital Signs:  Patient profile:   65 year old female Height:      63 inches Weight:      133 pounds BMI:     23.65 BSA:     1.63 Temp:     9.2 degrees F oral Pulse rate:   72 / minute Resp:     14 per minute BP sitting:   110 / 80  (left arm)  Vitals Entered By: Ardyth Man (March 14, 2008 11:15 AM) CC: URI symptoms Is Patient Diabetic? No Pain Assessment Patient in pain? no       Have you ever been in a relationship where you felt threatened, hurt or afraid?No   Does patient need assistance? Functional Status Self care   History of Present Illness:       This is a 65 year old woman who presents with URI symptoms.  The symptoms began 4 days ago.  Pt here c/o congestion ---she took otc meds with no relief .  Pt was trimming crape myrtles Monday and raked leaves Tuesday.   Pt did take antihistamine otc with very little relief.  The patient complains of nasal congestion, purulent nasal discharge, sore throat, dry cough, and earache, but denies sick contacts.  The patient denies fever, low-grade fever (<100.5 degrees), fever of 100.5-103 degrees, fever of 103.1-104 degrees, fever to >104 degrees, stiff neck, dyspnea, wheezing, rash, vomiting, diarrhea, use of an antipyretic, and response to antipyretic.  The patient also reports sneezing.  The patient denies itchy watery eyes, itchy throat, seasonal symptoms, response to antihistamine, headache, muscle aches, and severe fatigue.  The patient denies the following risk factors for Strep sinusitis: unilateral facial pain, unilateral nasal discharge, poor response to decongestant, double sickening, tooth pain, Strep exposure, tender adenopathy, and absence of cough.    Allergies: No Known Drug Allergies  Past History  Past Medical History: IBS GERD Osteoporosis Hyperlipidemia Current Problems:  ACUTE SINUSITIS, UNSPECIFIED (ICD-461.9) COUGH (ICD-786.2) HYPERLIPIDEMIA  (ICD-272.4) OTHER SPECIFIED ANEMIAS (ICD-285.8) OSTEOPOROSIS (ICD-733.00) GERD (ICD-530.81) FAMILY HISTORY OF CAD FEMALE 1ST DEGREE RELATIVE <50 (ICD-V17.3) FAMILY HISTORY BREAST CANCER 1ST DEGREE RELATIVE <50 (ICD-V16.3) IRRITABLE BOWEL SYNDROME, HX OF (ICD-V12.79) CHEST PAIN, ACUTE (ICD-786.50)  (05/10/2007)  Past Surgical History: Breast Augmentation    (05/10/2007)  Family History: Family History Breast cancer 1st degree relative @58  Mother Muncle-- esoph. ca Maunt- [place], Breast CA Family History of CAD Female 1st degree relative 33--  f, [place] (10/20/2006)  Social History: Occupation: Sports coach Single Never Smoked Alcohol use-no Drug use-no Regular exercise-no  (10/20/2006)  Risk Factors: Alcohol Use: N/A >5 drinks/d w/in last 3 months: N/A Caffeine Use: N/A Diet: N/A Exercise: no (10/20/2006)  Risk Factors: Smoking Status: never (10/20/2006) Packs/Day: N/A Cigars/wk: N/A Pipe Use/wk: N/A Cans of tobacco/wk: N/A Passive Smoke Exposure: no (10/20/2006)  Past Medical History:    Reviewed history from 05/10/2007 and no changes required:       IBS       GERD       Osteoporosis       Hyperlipidemia       Current Problems:        ACUTE SINUSITIS, UNSPECIFIED (ICD-461.9)       COUGH (ICD-786.2)       HYPERLIPIDEMIA (ICD-272.4)       OTHER SPECIFIED ANEMIAS (ICD-285.8)       OSTEOPOROSIS (ICD-733.00)       GERD (ICD-530.81)  FAMILY HISTORY OF CAD FEMALE 1ST DEGREE RELATIVE <50 (ICD-V17.3)       FAMILY HISTORY BREAST CANCER 1ST DEGREE RELATIVE <50 (ICD-V16.3)       IRRITABLE BOWEL SYNDROME, HX OF (ICD-V12.79)       CHEST PAIN, ACUTE (ICD-786.50)  Family History:    Reviewed history from 10/20/2006 and no changes required:       Family History Breast cancer 1st degree relative @58  Mother       Muncle-- esoph. ca       Maunt- [place], Breast CA              Family History of CAD Female 1st degree relative 35--  f, [place]  Social History:    Reviewed history from  10/20/2006 and no changes required:       Occupation: Sports coach       Single       Never Smoked       Alcohol use-no       Drug use-no       Regular exercise-no  Review of Systems      See HPI  Physical Exam  General:  Well-developed,well-nourished,in no acute distress; alert,appropriate and cooperative throughout examination Ears:  External ear exam shows no significant lesions or deformities.  Otoscopic examination reveals clear canals, tympanic membranes are intact bilaterally without bulging, retraction, inflammation or discharge. Hearing is grossly normal bilaterally. Nose:  L frontal sinus tenderness, L maxillary sinus tenderness, R frontal sinus tenderness, and R maxillary sinus tenderness.   Mouth:  Oral mucosa and oropharynx without lesions or exudates.  Teeth in good repair. Neck:  No deformities, masses, or tenderness noted. Lungs:  Normal respiratory effort, chest expands symmetrically. Lungs are clear to auscultation, no crackles or wheezes. Heart:  Normal rate and regular rhythm. S1 and S2 normal without gallop, murmur, click, rub or other extra sounds. Skin:  Intact without suspicious lesions or rashes Psych:  Cognition and judgment appear intact. Alert and cooperative with normal attention span and concentration. No apparent delusions, illusions, hallucinations   Impression & Recommendations:  Problem # 1:  SINUSITIS- ACUTE-NOS (ICD-461.9)  Her updated medication list for this problem includes:    Augmentin 875-125 Mg Tabs (Amoxicillin-pot clavulanate) .Marland Kitchen... 1 by mouth two times a day  Instructed on treatment. Call if symptoms persist or worsen.   Problem # 2:  HYPERLIPIDEMIA (ICD-272.4) check labs Her updated medication list for this problem includes:    Crestor 10 Mg Tabs (Rosuvastatin calcium) .Marland Kitchen... Take one tablet daily  Labs Reviewed: Chol: 246 (11/11/2006)   HDL: 71.3 (11/11/2006)   LDL: DEL (11/11/2006)   TG: 57 (11/11/2006) SGOT: 19 (02/20/2008)    SGPT: 14 (02/20/2008)  Complete Medication List: 1)  Boniva 150 Mg Tabs (Ibandronate sodium) .Marland Kitchen.. 1 by mouth monthly 2)  Ferrex 150 150 Mg Caps (Polysaccharide iron complex) .Marland Kitchen.. 1 by mouth once daily 3)  Fish Oil 1000 Mg Caps (Omega-3 fatty acids) .Marland Kitchen.. 1 by mouth three times a day 4)  Flax Seed Oil 1000 Mg Caps (Flaxseed (linseed)) .Marland Kitchen.. 1 by mouth once daily 5)  Ocuvite  .Marland KitchenMarland Kitchen. 1 by mouth once daily 6)  Acai Friut  .Marland KitchenMarland Kitchen. 1 by mouth once daily 7)  Caltrate 600mg   .... 1 by mouth two times a day 8)  Adult Aspirin Low Strength 81 Mg Tbdp (Aspirin) .Marland Kitchen.. 1 by mouth once daily 9)  Crestor 10 Mg Tabs (Rosuvastatin calcium) .... Take one tablet daily 10)  Augmentin 875-125 Mg Tabs (Amoxicillin-pot  clavulanate) .Marland Kitchen.. 1 by mouth two times a day  Patient Instructions: 1)  272.4 fasting labs in 2 months   hep, lipid 2)  The medication list was reviewed and reconciled.  All changed / newly prescribed medications were explained.  A complete medication list was provided to the patient / caregiver. Prescriptions: AUGMENTIN 875-125 MG TABS (AMOXICILLIN-POT CLAVULANATE) 1 by mouth two times a day  #20 x 0   Entered and Authorized by:   Loreen Freud DO   Signed by:   Loreen Freud DO on 03/14/2008   Method used:   [facility]        [facility] [phone]* (retail)       [address]       [place], [place]  [address]       Ph: [phone]       Fax: [phone]   RxID:   [id number]

## 2010-02-03 NOTE — Progress Notes (Signed)
Summary: labs(lmom 3/25)  Phone Note Outgoing Call   Call placed by: Doristine Devoid,  March 27, 2009 3:27 PM Call placed to: Patient Summary of Call: Pt was supposed to increase crestor to once daily --- did she do it?-----  If yes ---we need to increase Crestor 10 mg once daily #90 1 refills----recheck 3 months----NMR, hep 272.4    Follow-up for Phone Call        left message on machine .......Marland KitchenDoristine Devoid  March 27, 2009 3:27 PM   Additional Follow-up for Phone Call Additional follow up Details #1::        Pt did not increase, she states she cannot take 10 mg of Crestor last time it made her "cripple" she says. Please advise. Pt would like for me to leave what we did on answering macnhine. [facility] [address]  Army Fossa CMA  March 28, 2009 8:49 AM     Additional Follow-up for Phone Call Additional follow up Details #2::    add welchol packet 1 daily----can give samples Follow-up by: Loreen Freud DO,  March 28, 2009 9:47 AM  Additional Follow-up for Phone Call Additional follow up Details #3:: Details for Additional Follow-up Action Taken: Left message for pt telling her we were going to add a medication and we had samples, but for her to call back so we could go over it. Army Fossa CMA  March 28, 2009 10:03 AM  Pt is aware- will pick up samples. Army Fossa CMA  March 28, 2009 1:37 PM   New/Updated Medications: WELCHOL 3.75 GM PACK (COLESEVELAM HCL) 1 packet daily as directed WELCHOL 3.75 GM PACK (COLESEVELAM HCL) 1 packet as directed.

## 2010-02-03 NOTE — Progress Notes (Signed)
Summary: sub for boniva(lmom 3/7,3/8)  Phone Note Call from Patient   Caller: Patient Summary of Call: patient left msg on voicemail her insurance has changed and boniva is now $300 her insurance will cover either Actonel or Fosamax so patient would like to know which would benefit her better.  Initial call taken by: Doristine Devoid,  March 07, 2009 11:19 AM  Follow-up for Phone Call        actonel 150 #4  1 by mouth monthly Follow-up by: Loreen Freud DO,  March 09, 2009 8:42 AM  Additional Follow-up for Phone Call Additional follow up Details #1::        LMTCB. Army Fossa CMA  March 10, 2009 10:33 AM     Additional Follow-up for Phone Call Additional follow up Details #2::    Left message with a female at the home number.Army Fossa CMA  March 11, 2009 9:31 AM   Additional Follow-up for Phone Call Additional follow up Details #3:: Details for Additional Follow-up Action Taken: Pt called and left a message stating that she is going to have labwork on friday, so she will stop by to get new med then. Army Fossa CMA  March 11, 2009 11:02 AM

## 2010-02-03 NOTE — Progress Notes (Signed)
Summary: lmtc 10-04-07/fld  Phone Note Outgoing Call   Call placed by: Jeremy Johann CMA,  October 04, 2007 11:51 AM Call placed to: Patient Summary of Call: left message for pt to call back.  +osteopenic, con't calcium two times a day  and add vita d3 2000u daily and re check in 2 years Initial call taken by: Jeremy Johann CMA,  October 04, 2007 11:54 AM  Follow-up for Phone Call        pt aware of lab results Follow-up by: [facility] CMA,  October 05, 2007 9:14 AM

## 2010-02-03 NOTE — Assessment & Plan Note (Signed)
Summary: STOMACH VIRUS/KDC   Vital Signs:  Patient profile:   65 year old female Height:      63 inches Weight:      131.25 pounds Temp:     98.5 degrees F oral Pulse rate:   72 / minute Resp:     14 per minute BP sitting:   130 / 80  (right arm)  Vitals Entered By: Ardyth Man (May 21, 2008 9:46 AM) CC: diarrhea, nausea, vomiting on Friday, still doesn't feel well, hasn't been able to eat, exploding flatulence, Abdominal Pain Is Patient Diabetic? No Pain Assessment Patient in pain? no       Have you ever been in a relationship where you felt threatened, hurt or afraid?No   Does patient need assistance? Functional Status Self care Ambulation Normal   History of Present Illness:       This is a 65 year old woman who presents with Abdominal Pain.  The symptoms began 1 week ago.  The patient reports nausea, vomiting, and diarrhea, but denies constipation, melena, hematochezia, anorexia, and hematemesis.  The location of the pain is left upper quadrant.  The pain is described as intermittent and sharp.  The patient denies the following symptoms: fever, weight loss, dysuria, chest pain, jaundice, dark urine, missed menstrual period, and vaginal bleeding.  The pain is worse with food.  The pain is better with antispasmodics.  Pt last episode diarrhea was Friday.  Pt states that whenever she eats it goes right through her.  Pt ate a sandwich with oil and vinager dressing on Wed and was sick by Friday.  No one else got sick.    Current Medications (verified): 1)  Boniva 150 Mg  Tabs (Ibandronate Sodium) .Marland Kitchen.. 1 By Mouth Monthly 2)  Ferrex 150 150 Mg  Caps (Polysaccharide Iron Complex) .Marland Kitchen.. 1 By Mouth Once Daily 3)  Fish Oil 1000 Mg  Caps (Omega-3 Fatty Acids) .Marland Kitchen.. 1 By Mouth Three Times A Day 4)  Flax Seed Oil 1000 Mg  Caps (Flaxseed (Linseed)) .Marland Kitchen.. 1 By Mouth Once Daily 5)  Ocuvite .Marland Kitchen.. 1 By Mouth Once Daily 6)  Acai Friut .Marland Kitchen.. 1 By Mouth Once Daily 7)  Caltrate 600mg  .... 1 By  Mouth Two Times A Day 8)  Adult Aspirin Low Strength 81 Mg  Tbdp (Aspirin) .Marland Kitchen.. 1 By Mouth Once Daily 9)  Crestor 10 Mg Tabs (Rosuvastatin Calcium) .... Take One Tablet Daily 10)  Promethazine Hcl 25 Mg Tabs (Promethazine Hcl) .Marland Kitchen.. 1 By Mouth Qid As Needed 11)  Levsin/sl 0.125 Mg Subl (Hyoscyamine Sulfate) .Marland Kitchen.. 1-2 Sl Q4h As Needed Abd Cramps  Allergies (verified): No Known Drug Allergies  Past History:  Family History:    Family History Breast cancer 1st degree relative @58  Mother    Muncle-- esoph. ca    Maunt- [place], Breast CA    Family History of CAD Female 1st degree relative 25--  f, [place] (10/20/2006)  Risk Factors:    Alcohol Use: N/A    >5 drinks/d w/in last 3 months: N/A    Caffeine Use: N/A    Diet: N/A    Exercise: no (10/20/2006)  Past medical, surgical, family and social histories (including risk factors) reviewed, and no changes noted (except as noted below).  Past Medical History:    Reviewed history from 05/10/2007 and no changes required:    IBS    GERD    Osteoporosis    Hyperlipidemia    Current Problems:     ACUTE  SINUSITIS, UNSPECIFIED (ICD-461.9)    COUGH (ICD-786.2)    HYPERLIPIDEMIA (ICD-272.4)    OTHER SPECIFIED ANEMIAS (ICD-285.8)    OSTEOPOROSIS (ICD-733.00)    GERD (ICD-530.81)    FAMILY HISTORY OF CAD FEMALE 1ST DEGREE RELATIVE <50 (ICD-V17.3)    FAMILY HISTORY BREAST CANCER 1ST DEGREE RELATIVE <50 (ICD-V16.3)    IRRITABLE BOWEL SYNDROME, HX OF (ICD-V12.79)    CHEST PAIN, ACUTE (ICD-786.50)  Past Surgical History:    Reviewed history from 05/10/2007 and no changes required:    Breast Augmentation    Family History:    Reviewed history from 10/20/2006 and no changes required:       Family History Breast cancer 1st degree relative @58  Mother       Muncle-- esoph. ca       Maunt- [place], Breast CA              Family History of CAD Female 1st degree relative 42--  f, [place]  Social History:    Reviewed history from 10/20/2006 and no changes required:        Occupation: Sports coach       Single       Never Smoked       Alcohol use-no       Drug use-no       Regular exercise-no  Review of Systems      See HPI  Physical Exam  General:  Well-developed,well-nourished,in no acute distress; alert,appropriate and cooperative throughout examination Mouth:  Oral mucosa and oropharynx without lesions or exudates.  Teeth in good repair. Lungs:  Normal respiratory effort, chest expands symmetrically. Lungs are clear to auscultation, no crackles or wheezes. Abdomen:  Bowel sounds positive,abdomen soft and non-tender without masses, organomegaly or hernias noted. Skin:  Intact without suspicious lesions or rashes Cervical Nodes:  No lymphadenopathy noted Psych:  Cognition and judgment appear intact. Alert and cooperative with normal attention span and concentration. No apparent delusions, illusions, hallucinations   Impression & Recommendations:  Problem # 1:  GASTROENTERITIS (ICD-558.9) levsin sl phenergan  call or rto as needed  Orders: TLB-BMP (Basic Metabolic Panel-BMET) (80048-METABOL) TLB-Hepatic/Liver Function Pnl (80076-HEPATIC) TLB-CBC Platelet - w/Differential (85025-CBCD) T-Culture, Stool ([id number]) T-Culture, C-Diff Toxin A/B ([id number])  Discussed use of medication and role of diet. Encouraged clear liquids and electrolyte replacement fluids. Instructed to call if any signs of worsening dehydration.   Complete Medication List: 1)  Boniva 150 Mg Tabs (Ibandronate sodium) .Marland Kitchen.. 1 by mouth monthly 2)  Ferrex 150 150 Mg Caps (Polysaccharide iron complex) .Marland Kitchen.. 1 by mouth once daily 3)  Fish Oil 1000 Mg Caps (Omega-3 fatty acids) .Marland Kitchen.. 1 by mouth three times a day 4)  Flax Seed Oil 1000 Mg Caps (Flaxseed (linseed)) .Marland Kitchen.. 1 by mouth once daily 5)  Ocuvite  .Marland KitchenMarland Kitchen. 1 by mouth once daily 6)  Acai Friut  .Marland KitchenMarland Kitchen. 1 by mouth once daily 7)  Caltrate 600mg   .... 1 by mouth two times a day 8)  Adult Aspirin Low Strength 81 Mg Tbdp  (Aspirin) .Marland Kitchen.. 1 by mouth once daily 9)  Crestor 10 Mg Tabs (Rosuvastatin calcium) .... Take one tablet daily 10)  Promethazine Hcl 25 Mg Tabs (Promethazine hcl) .Marland Kitchen.. 1 by mouth qid as needed 11)  Levsin/sl 0.125 Mg Subl (Hyoscyamine sulfate) .Marland Kitchen.. 1-2 sl q4h as needed abd cramps Prescriptions: LEVSIN/SL 0.125 MG SUBL (HYOSCYAMINE SULFATE) 1-2 sl q4h as needed abd cramps  #30 x 0   Entered and Authorized by:   Loreen Freud DO  Signed by:   Loreen Freud DO on 05/21/2008   Method used:   Electronically to        [facility] [phone]* (retail)       [address]       [place], [place]  [address]       Ph: [phone]       Fax: [phone]   RxID:   [id number] PROMETHAZINE HCL 25 MG TABS (PROMETHAZINE HCL) 1 by mouth qid as needed  #30 x 0   Entered and Authorized by:   Loreen Freud DO   Signed by:   Loreen Freud DO on 05/21/2008   Method used:   [facility]ronically to        [facility] [phone]* (retail)       [address]       [place], [place]  [address]       Ph: [phone]       Fax: [phone]   RxID:   [id number] CRESTOR 10 MG TABS (ROSUVASTATIN CALCIUM) Take one tablet daily  #90 x 3   Entered and Authorized by:   Loreen Freud DO   Signed by:   Loreen Freud DO on 05/21/2008   Method used:   [facility]ronically to        [facility] [phone]* (retail)       [address]       [place], [place]  [address]       Ph: [phone]       Fax: [phone]   RxID:   [id number]

## 2010-02-03 NOTE — Assessment & Plan Note (Signed)
Summary: dyspnea,cough/jd   Visit Type:  Initial Consult Copy to:  pcp Primary Provider/Referring Provider:  Laury Axon  CC:  Pt is here for pulmonary consult. Pt had PFT 11/2008. Pt c/o S.O.B with activity x 6 months.  History of Present Illness: [name] [name] never smoker for evaluation of dyspnea & PFTs. 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 She reports dyspnea unrelated to exertion x at least 3 years. She does feel this more on walking hills but feels that she could walk to '[place]' on level ground. 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 I note an evaluation by Dr Diona Browner in 2008 with a false positive stress test, EF 60% & normal coronaries on subsequent cath. PFTs showed near normal lung function except for mild reversibility in smaller airways. Lung volumes & DLCO were normal. She was placed on qvar due to  this & may have helped. She did not want to take advair due to side effects. She does admit to being an anxious personality & an overriding fear of death - she has outlived her female family peers. She seems to have good insight into these unreasonable fears. She denies allergy symptoms, heartburn, wheezing or a family history of thromboembolic disease/ sudden death.  Preventive Screening-Counseling & Management  Alcohol-Tobacco     Smoking Status: never  Current Medications (verified): 1)  Boniva 150 Mg  Tabs (Ibandronate Sodium) .Marland Kitchen.. 1 By Mouth Monthly 2)  Ferrex 150 150 Mg  Caps (Polysaccharide Iron Complex) .Marland Kitchen.. 1 By Mouth Once Daily 3)  Fish Oil 1200 Mg Caps (Omega-3 Fatty Acids) .... Take 1 Capsule By Mouth Once A Day 4)  Flax Seed Oil 1000 Mg  Caps (Flaxseed (Linseed)) .Marland Kitchen.. 1 By Mouth Once Daily 5)  Citracal/vitamin D 250-200 Mg-Unit Tabs (Calcium Citrate-Vitamin D) .... Take 1 Tablet By Mouth Once A Day 6)  Adult Aspirin Low Strength 81 Mg  Tbdp (Aspirin) .Marland Kitchen.. 1  By Mouth Once Daily 7)  Qvar 80 Mcg/act Aers (Beclomethasone Dipropionate) .... 2 Puffs Every Morning 8)  Crestor 5 Mg Tabs (Rosuvastatin Calcium) .Marland Kitchen.. 1 By Mouth Mwf 9)  Microspacer  Misc (Spacer/aero-Holding Chambers) .... As Directed. 10)  Multivitamins   Tabs (Multiple Vitamin) .... Take 1 Tablet By Mouth Once A Day 11)  Vitamin D3 1000 Unit Caps (Cholecalciferol) .... Take 1 Tablet By Mouth Once A Day  Allergies (verified): No Known Drug Allergies  Past History:  Past Medical History: Last updated: 05/10/2007 IBS GERD Osteoporosis Hyperlipidemia Current Problems:  ACUTE SINUSITIS, UNSPECIFIED (ICD-461.9) COUGH (ICD-786.2) HYPERLIPIDEMIA (ICD-272.4) OTHER SPECIFIED ANEMIAS (ICD-285.8) OSTEOPOROSIS (ICD-733.00) GERD (ICD-530.81) FAMILY HISTORY OF CAD FEMALE 1ST DEGREE RELATIVE <50 (ICD-V17.3) FAMILY HISTORY BREAST CANCER 1ST DEGREE RELATIVE <50 (ICD-V16.3) IRRITABLE BOWEL SYNDROME, HX OF (ICD-V12.79) CHEST PAIN, ACUTE (ICD-786.50)  Past Surgical History: Last updated: 05/10/2007 Breast Augmentation    Social History: Occupation: retired from Sports coach Single Never Smoked Alcohol use-no Drug use-no Regular exercise-no  Review of Systems       The patient complains of shortness of breath with activity, non-productive cough, and anxiety.  The patient denies shortness of breath at rest, productive cough, coughing up blood, chest pain, irregular heartbeats, acid heartburn, indigestion, loss of appetite, weight change, abdominal pain, difficulty swallowing, sore throat, tooth/dental problems, headaches, nasal congestion/difficulty breathing through nose, sneezing, itching, ear ache, depression, hand/feet swelling, joint stiffness or  pain, rash, change in color of mucus, and fever.    Vital Signs:  Patient profile:   65 year old female Height:      63 inches Weight:      136.13 pounds O2 Sat:      98 % on Room air Temp:     98.5 degrees F oral Pulse rate:   62 /  minute BP sitting:   120 / 76  (left arm) Cuff size:   regular  Vitals Entered By: Zackery Barefoot CMA (April 04, 2009 10:34 AM)  O2 Flow:  Room air CC: Pt is here for pulmonary consult. Pt had PFT 11/2008. Pt c/o S.O.B with activity x 6 months Comments Medications reviewed with patient Verified contact number and pharmacy with patient Zackery Barefoot CMA  April 04, 2009 10:34 AM    Physical Exam  Additional Exam:  Gen. Pleasant, well-nourished, in no distress, normal affect ENT - no lesions, no post nasal drip Neck: No JVD, no thyromegaly, no carotid bruits Lungs: no use of accessory muscles, no dullness to percussion, clear without rales or rhonchi  Cardiovascular: Rhythm regular, heart sounds  normal, no murmurs or gallops, no peripheral edema Abdomen: soft and non-tender, no hepatosplenomegaly, BS normal. Musculoskeletal: No deformities, no cyanosis or clubbing Neuro:  alert, non focal     Impression & Recommendations:  Problem # 1:  DYSPNEA ON EXERTION (ICD-786.09)  I doubt that she has any significant lung disease. Lung function is near normal. She does not seem to have asthma & as such qvar is probably unnecessary. I had a frank discussion with her about anxiety & this may be the area to focus on if symptoma persist in the future. I fear that bronchodilators may only make this owrse. I dod not see a CXR in our system & if persistent dyspnea , would probably obtain one. She has no risk factors for thromboembolic disease. Her updated medication list for this problem includes:    Qvar 80 Mcg/act Aers (Beclomethasone dipropionate) .Marland Kitchen... 2 puffs every morning  Orders: Consultation Level III (16109)  Medications Added to Medication List This Visit: 1)  Fish Oil 1200 Mg Caps (Omega-3 fatty acids) .... Take 1 capsule by mouth once a day 2)  Citracal/vitamin D 250-200 Mg-unit Tabs (Calcium citrate-vitamin d) .... Take 1 tablet by mouth once a day 3)  Qvar 80 Mcg/act Aers  (Beclomethasone dipropionate) .... 2 puffs every morning 4)  Multivitamins Tabs (Multiple vitamin) .... Take 1 tablet by mouth once a day 5)  Vitamin D3 1000 Unit Caps (Cholecalciferol) .... Take 1 tablet by mouth once a day  Patient Instructions: 1)  Please schedule a follow-up appointment as needed. 2)  OK to come off Qvar 3)  Let us focus on your anxiety more if this persists

## 2010-02-03 NOTE — Assessment & Plan Note (Signed)
Summary: chest pain//tl  Medications Added BONIVA 150 MG  TABS (IBANDRONATE SODIUM) 1 by mouth MONTHLY FERREX 150 150 MG  CAPS (POLYSACCHARIDE IRON COMPLEX) 1 by mouth once daily FISH OIL 1000 MG  CAPS (OMEGA-3 FATTY ACIDS) 1 by mouth three times a day FLAX SEED OIL 1000 MG  CAPS (FLAXSEED (LINSEED)) 1 by mouth once daily * OCUVITE 1 by mouth once daily * ACAI FRIUT 1 by mouth once daily * CALTRATE 600MG  1 by mouth two times a day ALPRAZOLAM 0.25 MG  TABS (ALPRAZOLAM) 1 by mouth three times a day as needed      Allergies Added: NKDA  Vital Signs:  Patient Profile:   65 Years Old Female Weight:      126.8 pounds O2 Sat:      99 % Temp:     99.9 degrees F oral Pulse rate:   66 / minute BP sitting:   124 / 78  (left arm) Cuff size:   large  Vitals Entered By: Shonna Chock (October 20, 2006 1:37 PM)                 PCP:  Laury Axon  Chief Complaint:  SOB AND CHEST TIGHTNESS .  History of Present Illness: Pt here with hx of chest tightness and Sob.  No symptoms today.  Pt only has symptoms at work and when she goes to sleep at night she feels like bugs are crawling all over her.  She also complains of sob with exertion.  No radiation of pain and it goes away with relaxation and rest.      Current Allergies: No known allergies   Past Medical History:    IBS   Family History:    Family History Breast cancer 1st degree relative @58  Mother    Muncle-- esoph. ca    Maunt- [place], Breast CA        Family History of CAD Female 1st degree relative 55--  f, [place]  Social History:    Occupation: Sports coach    Single    Never Smoked    Alcohol use-no    Drug use-no    Regular exercise-no   Risk Factors:  Tobacco use:  never Passive smoke exposure:  no Drug use:  no HIV high-risk behavior:  no Alcohol use:  no Exercise:  no   Review of Systems      See HPI  General      Denies chills, fatigue, fever, loss of appetite, malaise, sleep disorder, sweats, weakness, and  weight loss.  ENT      Denies decreased hearing, difficulty swallowing, ear discharge, earache, hoarseness, nasal congestion, nosebleeds, postnasal drainage, ringing in ears, sinus pressure, and sore throat.  CV      Complains of chest pain or discomfort, difficulty breathing at night, and shortness of breath with exertion.      Denies bluish discoloration of lips or nails, difficulty breathing while lying down, fainting, fatigue, leg cramps with exertion, lightheadness, near fainting, palpitations, swelling of feet, swelling of hands, and weight gain.  Resp      Complains of chest discomfort and shortness of breath.      Denies chest pain with inspiration, cough, coughing up blood, excessive snoring, hypersomnolence, morning headaches, pleuritic, sputum productive, and wheezing.  GI      Denies abdominal pain, bloody stools, change in bowel habits, constipation, dark tarry stools, diarrhea, excessive appetite, gas, hemorrhoids, indigestion, loss of appetite, nausea, vomiting, vomiting blood, and yellowish skin color.  MS      Denies joint pain, joint redness, joint swelling, loss of strength, low back pain, mid back pain, muscle aches, muscle , cramps, muscle weakness, stiffness, and thoracic pain.  Psych      Complains of anxiety.   Physical Exam  General:     Well-developed,well-nourished,in no acute distress; alert,appropriate and cooperative throughout examination Neck:     No deformities, masses, or tenderness noted. Lungs:     Normal respiratory effort, chest expands symmetrically. Lungs are clear to auscultation, no crackles or wheezes. Heart:     Normal rate and regular rhythm. S1 and S2 normal without gallop, murmur, click, rub or other extra sounds. Extremities:     No clubbing, cyanosis, edema, or deformity noted with normal full range of motion of all joints.   Neurologic:     No cranial nerve deficits noted. Station and gait are normal. Plantar reflexes are  down-going bilaterally. DTRs are symmetrical throughout. Sensory, motor and coordinative functions appear intact. Psych:     Oriented X3, memory intact for recent and remote, normally interactive, good eye contact, and not anxious appearing.      Impression & Recommendations:  Problem # 1:  CHEST PAIN, ACUTE (ICD-786.50) I suspect this may be anxiety related, but we need to r/o cardiac/ pulm etiology Orders: Venipuncture (16109) TLB-BMP (Basic Metabolic Panel-BMET) (80048-METABOL) TLB-CBC Platelet - w/Differential (85025-CBCD) TLB-TSH (Thyroid Stimulating Hormone) (84443-TSH) T- * Misc. Laboratory test [phone]) d dimer Cardiology Referral (Cardiology) stress test If symptoms occur again pt understands she should go to er [facility] daily  Orders: Venipuncture (09811) TLB-BMP (Basic Metabolic Panel-BMET) (80048-METABOL) TLB-CBC Platelet - w/Differential (85025-CBCD) TLB-TSH (Thyroid Stimulating Hormone) (84443-TSH) T- * Misc. Laboratory test [id number]) Cardiology Referral (Cardiology) EKG w/ Interpretation (93000)   Complete Medication List: 1)  Boniva 150 Mg Tabs (Ibandronate sodium) .Marland Kitchen.. 1 by mouth monthly 2)  Ferrex 150 150 Mg Caps (Polysaccharide iron complex) .Marland Kitchen.. 1 by mouth once daily 3)  Fish Oil 1000 Mg Caps (Omega-3 fatty acids) .Marland Kitchen.. 1 by mouth three times a day 4)  Flax Seed Oil 1000 Mg Caps (Flaxseed (linseed)) .Marland Kitchen.. 1 by mouth once daily 5)  Ocuvite  .Marland KitchenMarland Kitchen. 1 by mouth once daily 6)  Acai Friut  .Marland KitchenMarland Kitchen. 1 by mouth once daily 7)  Caltrate 600mg   .... 1 by mouth two times a day 8)  Alprazolam 0.25 Mg Tabs (Alprazolam) .Marland Kitchen.. 1 by mouth three times a day as needed     Prescriptions: ALPRAZOLAM 0.25 MG  TABS (ALPRAZOLAM) 1 by mouth three times a day as needed  #20 x 0   Entered and Authorized by:   Loreen Freud DO   Signed by:   Loreen Freud DO on 10/20/2006   Method used:   Print then Give to Patient   RxID:   [id number]  ]  EKG  Procedure date:   10/20/2006  Findings:      Sinus bradycardia with rate of:  57   EKG  Procedure date:  10/20/2006  Findings:      Sinus bradycardia with rate of:  57

## 2010-05-19 NOTE — Assessment & Plan Note (Signed)
[facility]                            CARDIOLOGY OFFICE NOTE   NAME:Berhe, Murry                      MRN:          [id number]  DATE:11/15/2006                            DOB:          02-09-1945    CARDIOLOGY CONSULTATION   REFERRING PHYSICIAN:  Lelon Perla, DO.   REASON FOR CONSULTATION:  Abnormal stress test with dyspnea on exertion  and history of palpitations.   HISTORY OF PRESENT ILLNESS:  [name] [name] is a 65 year old woman with  no reported major medical conditions.  She reports a one to two month  history of progressive dyspnea on exertion.  She has noted this  specifically with activities such as climbing a flight of steps at her  home and also with activities such as walking from her car to her place  of business, which involves going up an incline.  She has had no frank  chest pain associated with this.  She has also noted a dry cough and  has been placed on Nexium for possible reflux.  She reports a history of  cardiovascular disease in her family stating that her father died in his  34's suddenly with a myocardial infarction.  It is not entirely clear  whether he had any premature cardiovascular disease, however.  In  addition to these symptoms, she has a longer-standing history of  anxiety attacks where she describes a sudden feeling of racing heart  rate and anxiety.  She states that these symptoms can last for several  minutes at a time and that she tries to talk herself through it.  She  has never had any frank syncope associated with this.  I note recent  blood work showing a D-dimer that was normal at 0.22, HDL of 68, LDL of  125, TSH at 1.8, and a hemoglobin of 10.7 (reportedly one day after  giving blood at the [facility]).   Ms. Dibuono was referred for an exercise Myoview by Dr. Laury Axon and this  was performed back on October 25, 2006.  She has not yet heard the  results of her testing and I have reviewed this with  her today.  She had  abnormal electrocardiographic changes including ST segment depression of  1 to 1.5 mm in leads II, III, aVF, with nondiagnostic changes in the  lateral leads, V4 through V6.  She did report chest pain with the study.  It is also interesting to note that at baseline she has a short PR  interval of 106 milliseconds with some slurring of the anterior portion  of the QRS complex suggestive of potential pre-excitation.  Her  perfusion imaging showed no frank ischemia.  She did have an anterior  defect that was fixed, also effecting some of the inferior septal wall.  This was described as being consistent with an infarct, although her  ejection fraction was hyperdynamic at 76% with normal wall motion.   ALLERGIES:  No reported drug allergies.   PRESENT MEDICATIONS:  Her present medications include:  1. Boniva 150 mg monthly.  2. Ferrex 150 mg p.o. daily.  3. Aspirin  81 mg p.o. daily.  4. Ocuvite.  5. Omega 3 supplements.  6. Multivitamins.  7. Flax seed oil.  8. Calcium.  9. Nexium, 40 mg daily.   PAST MEDICAL HISTORY:  Is as outlined above.  The patient also reports  previous breast implants.  She had a small skin graft surgery done in  the past as well.   SOCIAL HISTORY:  The patient is single.  She has no children.  She  denies any tobacco or alcohol use.  No recreational drug use.  She  drinks two caffeinated beverages daily.  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  She is considering retirement soon.  <  /FAMILY HISTORY/>  Significant for death in the patient's mother at age 52 with cancer.  Death of the patient's father at age 31 with a heart attack.  Also  reports cardiovascular disease on the paternal side of her family.   REVIEW OF SYSTEMS:  As described in history of present illness.   Possible prior symptoms of depression.  Also some stomach irritability.   PHYSICAL EXAMINATION:  VITAL SIGNS:  Blood pressure 129/86, heart rate  65, weight is 129 pounds.  GENERAL APPEARANCE:  The patient is normally nourished and in no acute  distress.  HEENT:  Conjunctivae is normal.  Pharynx is clear.  NECK:  Supple.  No elevated jugular venous pressure.  No loud bruits.  No thyromegaly is noted.  LUNGS:  Clear without labored breathing at rest.  CARDIAC:  Exam reveals a regular rate and rhythm.  No S3 gallop.  No  pericardial rub.  ABDOMEN:  Soft, nontender,  normal active bowel sounds.  EXTREMITIES:  No pitting edema.  Distal pulses are 2+.  SKIN:  Warm and dry.  MUSCULOSKELETAL:  No kyphosis is noted.  NEUROPSYCHIATRIC:  The patient is alert and oriented x3.   IMPRESSION AND RECOMMENDATIONS:  1. Recent abnormal exercise Myoview including both      electrocardiographic changes, chest discomfort and fixed perfusion      defects without ischemia.  Notably, the patient has a short PR      interval with possible pre-excitation in reviewing her      electrocardiogram and it may well be that this is related to the      abnormal ST segment changes, particularly in light of her history      of intermittent palpitations and anxiety attacks.  The perfusion      data may actually be due to soft tissue attenuation artifact, given      their fixed nature in the setting of hyperdynamic normal left      ventricular wall motion.  She is, however, describing progressive      dyspnea on exertion over the last few months and may have some      family history of cardiovascular disease.  I reviewed all these      issues with her in detail today and discussed their implications.      We discussed possibilities regarding more definitive diagnostic      testing including cardiac catheterization versus perhaps and      exercise echocardiogram that might be less fraught with imaging      artifact  issues.  She is very hesitant to consider an invasive  evaluation and we have elected to proceed with an exercise      echocardiogram.  I think this would be useful.  If her      echocardiographic images are normal, in the setting of abnormal ST      segment changes, this would be much more consistent with an      indication of soft tissue attenuation effecting her myocardial      perfusion study and would make me wonder more about the possibility      of pre-excitation.  To pursue this further, we will also provide a      30 day event recorder and try to document her heart rhythm in the      setting of her feeling of palpitations.  I will then have her      return to the office over the next month and we can review the      results.  2. Further plans to follow.     Jonelle Sidle, MD  Electronically Signed    SGM/MedQ  DD: 11/15/2006  DT: 11/16/2006  Job #: [id number]   cc:   Lelon Perla, DO

## 2010-05-19 NOTE — Assessment & Plan Note (Signed)
[facility]                            CARDIOLOGY OFFICE NOTE   NAME:[name] [name]                      MRN:          [id number]  DATE:12/07/2006                            DOB:          Jan 03, 1946    PRIMARY CARE PHYSICIAN:  Dr. Loreen Freud.   REASON FOR VISIT:  Followup cardiac catheterization.   HISTORY OF PRESENT ILLNESS:  Ms. [name] [name] was referred for a diagnostic  right and left heart catheterization following an abnormal Myoview in  the setting of shortness of breath.  Actually, her catheterization was  quite reassuring, demonstrating no significant obstructive coronary  artery disease within the major epicardial vessels as well as normal  right heart hemodynamics with normal pulmonary artery pressure, normal  cardiac index and no resting hypoxia.  Left ventricular ejection  fraction was normal at 60%.  I reviewed these results with the patient  and her fiancee at that time.  I suspect that her dyspnea is not cardiac  related and at this point would recommend basic risk factor  modification, diet, and exercise.  She was very reassured by this.   ALLERGIES:  NO KNOWN DRUG ALLERGIES.   PRESENT MEDICATIONS:  1. Boniva 150 mg monthly.  2. Ferrex 150 mg daily.  3. Aspirin 81 mg daily.  4. Ocuvite.  5. Fish oil supplements.  6. Multivitamin.  7. Flax seed oil.  8. Calcium supplements.   REVIEW OF SYSTEMS:  As described in the history of present illness.  Patient states he has had a recent upper respiratory tract  infection/sinusitis under the care of Dr. Laury Axon.   EXAMINATION:  Blood pressure is 113/74, heart rate is 90, weight is 130  pounds, no significant changes compared to previous examination.   IMPRESSION AND RECOMMENDATION:  1. Reassuring cardiac catheterization demonstrating no significant      obstructive coronary artery disease, normal ejection fraction, and      normal right heart hemodynamics.  I suspect that her  abnormal      Myoview was related to attenuation artifact and that her      electrocardiographic changes were false positive abnormalities.      Would bear this in mind when arranging future studies and at this      point would recommend basic risk factor modification, diet and      exercise.  2. Patient will continue to follow up with Dr. Laury Axon.     Jonelle Sidle, MD  Electronically Signed    SGM/MedQ  DD: 12/07/2006  DT: 12/07/2006  Job #: [id number]   cc:   Lelon Perla, DO

## 2010-05-19 NOTE — Cardiovascular Report (Signed)
NAME:  [name] [name], [name] [name] NO.:  [id number]   MEDICAL RECORD NO.:  [id number]          PATIENT TYPE:  OIB   LOCATION:  1963                         FACILITY:  [facility]   PHYSICIAN:  Jonelle Sidle, MD DATE OF BIRTH:  November 07, 1945   DATE OF PROCEDURE:  11/21/2006  DATE OF DISCHARGE:                            CARDIAC CATHETERIZATION   REQUESTING CARDIOLOGIST:  Jonelle Sidle, MD.   PRIMARY CARE PHYSICIAN:  Lelon Perla, DO.   INDICATIONS:  [name] [name] is a 65 year old woman recently seen in  consultation in the office back on November 15, 2006.  She has some  family history of cardiovascular disease and recently underwent an  exercise Myoview given dyspnea on exertion.  This study showed abnormal  electrocardiographic changes with ST-segment depression and also fixed  perfusion defects in the anterior wall and inferior septum.  No clear  reversible defects were noted.  The ejection fraction was normal at 70%.  After considering the potential risks and benefits of a diagnostic  cardiac catheterization, the patient has agreed to proceed.  She is now  scheduled for a left and right heart catheterization for clear  definition of the coronary anatomy and also assessment of right heart  pressures to exclude pulmonary hypertension, given her shortness of  breath with activity.  Informed consent was obtained.   PROCEDURES PERFORMED:  1. Left heart catheterization.  2. Right heart catheterization.  3. Selective coronary angiography.  4. Left ventriculography.   ACCESS AND EQUIPMENT:  The area about the right femoral artery and vein  was anesthetized with 1% lidocaine.  A 4-French sheath was placed in the  right femoral artery via the modified Seldinger technique.  A 7-French  sheath was placed in the right femoral vein via the modified Seldinger  technique.  A balloon-tipped flow-directed catheter was used for right  heart catheterization and hemodynamic  assessment.  A 4-French JL-4 and 3-  D RC catheters were used for selective coronary geography and an angled  pigtail catheter was used for left heart catheterization and left  ventriculography.  All exchanges were made over a wire with the  exception of the right heart catheter.  Total of 70 mL Omnipaque were  used.  The patient tolerated the procedure well without immediate  complications.   HEMODYNAMIC RESULTS:  Right atrium mean of 8, right ventricle 28/5.  Pulmonary artery 24/11 with a mean of 16, pulmonary capillary wedge  pressure mean of 8.  Aorta 117/74.  Left ventricle 107/10.  Cardiac  output 3.7 by the Fick method.  Cardiac index 2.3 by the Fick method.  Arterial saturation 96%, pulmonary artery saturation 72%.   ANGIOGRAPHIC FINDINGS:  1. Left main coronary artery gives rise to left anterior descending, a      ramus intermedius and the circumflex vessels.  This vessel is      angiographically normal.  2. Left anterior descending is a medium caliber vessel that tapers in      the mid to distal segment as it approaches the tip particularly.      There are two diagonal branches, the first  of which bifurcates.      There are minor luminal irregularities without any significant flow-      limiting stenosis.  3. There is a medium caliber ramus intermedius that is      angiographically normal.  4. The circumflex vessel is medium in caliber with four obtuse      marginal branches, the third of which is the largest.  This is an      angiographically normal vessel.  5. The right coronary artery is medium in caliber and dominant.  This      vessel is angiographically normal.   Left ventriculography was performed in the RAO projection and reveals an  ejection fraction of approximately 60% with no significant mitral  regurgitation.   DIAGNOSES:  1. No significant obstructive coronary artery disease noted within the      major epicardial vessels.  2. Normal right heart  hemodynamics with no evidence of resting      hypoxia, normal pulmonary artery pressures, and normal cardiac      index.  Pulmonary artery saturation is also normal.  3. Left ventricular ejection fraction approximately 60% with no      significant mitral regurgitation and a left ventricular end-      diastolic pressure of 10 mmHg.   DISCUSSION:  I reviewed the results with the patient and her fiance.  At  this point, I would anticipate basic risk factor modification  strategies.  It may well be that her ST-segment abnormalities on stress  testing were false positive results and that the perfusion defects  were related to attenuation artifact.      Jonelle Sidle, MD  Electronically Signed     SGM/MEDQ  D:  11/21/2006  T:  11/21/2006  Job:  [id number]   cc:   Lelon Perla, DO

## 2010-10-13 LAB — POCT I-STAT 3, ART BLOOD GAS (G3+)
Acid-Base Excess: 2
Bicarbonate: 26.7 — ABNORMAL HIGH
O2 Saturation: 97
Operator id: 194801
TCO2: 28
pCO2 arterial: 42.5
pH, Arterial: 7.406 — ABNORMAL HIGH
pO2, Arterial: 91

## 2010-10-13 LAB — POCT I-STAT 3, VENOUS BLOOD GAS (G3P V)
Bicarbonate: 26.2 — ABNORMAL HIGH
O2 Saturation: 72
Operator id: 221371
TCO2: 28
pCO2, Ven: 49.1
pH, Ven: 7.336 — ABNORMAL HIGH
pO2, Ven: 41

## 2012-10-30 ENCOUNTER — Ambulatory Visit (INDEPENDENT_AMBULATORY_CARE_PROVIDER_SITE_OTHER): Payer: BC Managed Care – PPO | Admitting: Podiatry

## 2012-10-30 ENCOUNTER — Encounter: Payer: Self-pay | Admitting: Podiatry

## 2012-10-30 ENCOUNTER — Ambulatory Visit (INDEPENDENT_AMBULATORY_CARE_PROVIDER_SITE_OTHER): Payer: BC Managed Care – PPO

## 2012-10-30 VITALS — BP 123/79 | HR 54 | Resp 16

## 2012-10-30 DIAGNOSIS — M6740 Ganglion, unspecified site: Secondary | ICD-10-CM

## 2012-10-30 DIAGNOSIS — Z472 Encounter for removal of internal fixation device: Secondary | ICD-10-CM

## 2012-10-30 DIAGNOSIS — B351 Tinea unguium: Secondary | ICD-10-CM

## 2012-10-30 DIAGNOSIS — T847XXA Infection and inflammatory reaction due to other internal orthopedic prosthetic devices, implants and grafts, initial encounter: Secondary | ICD-10-CM

## 2012-10-30 NOTE — Progress Notes (Signed)
Subjective:     Patient ID: [name] [name], female   DOB: 01/16/1945, 67 y.o.   MRN: [id number]  Foot Pain   patient presents stating this area on my left first metatarsal is bothersome and I have a cyst on top of my left foot that has been there for about 6 months. Patient had foot surgery done approximately 5 years ago and has irritation around the pin site   Review of Systems  All other systems reviewed and are negative.       Objective:   Physical Exam  Nursing note and vitals reviewed. Cardiovascular: Intact distal pulses.   Musculoskeletal: Normal range of motion.  Skin: Skin is warm.   patient's left first metatarsal shaft shows a small nodule with prominent in positions noted and there is a nodule on the dorsum of the left foot near the ankle joint that is freely movable and appears to be fibrous    Assessment:     Abnormal pin placement left creating irritation around the first metatarsal. 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 She would like to get this corrected and I have recommended removal of the pins with possible removal of small cyst and then removal of the large cyst on the left ankle. Patient would like to have this done and read consent form today concerning correction understanding all possible complications as outlined and the fact that there is no long-term guarantees as far as success of surgery

## 2012-11-07 ENCOUNTER — Encounter: Payer: Self-pay | Admitting: Podiatry

## 2012-11-07 DIAGNOSIS — M898X9 Other specified disorders of bone, unspecified site: Secondary | ICD-10-CM

## 2012-11-07 DIAGNOSIS — Z472 Encounter for removal of internal fixation device: Secondary | ICD-10-CM

## 2012-11-07 DIAGNOSIS — M6740 Ganglion, unspecified site: Secondary | ICD-10-CM

## 2012-11-13 ENCOUNTER — Ambulatory Visit (INDEPENDENT_AMBULATORY_CARE_PROVIDER_SITE_OTHER): Payer: BC Managed Care – PPO | Admitting: Podiatry

## 2012-11-13 ENCOUNTER — Encounter: Payer: Self-pay | Admitting: Podiatry

## 2012-11-13 ENCOUNTER — Ambulatory Visit (INDEPENDENT_AMBULATORY_CARE_PROVIDER_SITE_OTHER): Payer: BC Managed Care – PPO

## 2012-11-13 VITALS — BP 125/82 | HR 75 | Resp 18

## 2012-11-13 DIAGNOSIS — M6740 Ganglion, unspecified site: Secondary | ICD-10-CM

## 2012-11-13 DIAGNOSIS — Z472 Encounter for removal of internal fixation device: Secondary | ICD-10-CM

## 2012-11-13 DIAGNOSIS — M79609 Pain in unspecified limb: Secondary | ICD-10-CM

## 2012-11-13 DIAGNOSIS — M79672 Pain in left foot: Secondary | ICD-10-CM

## 2012-11-13 DIAGNOSIS — M898X9 Other specified disorders of bone, unspecified site: Secondary | ICD-10-CM

## 2012-11-13 NOTE — Progress Notes (Signed)
°  Subjective:    Patient ID: [name] [name], female    DOB: Sep 24, 1945, 67 y.o.   MRN: [id number]  [facility] surgery on 11/27/12 left foot and doing well and does hurt when it bends where he took out the cyst and now seems to be better than it was    Review of Systems     Objective:   Physical Exam        Assessment & Plan:

## 2012-11-14 NOTE — Progress Notes (Signed)
Subjective:     Patient ID: [name] [name], female   DOB: [birth year], 67 y.o.   MRN: [id number]  HPI patient states I'm doing fine with surgery. I'm having some pain in top of the left foot but overall it is minimal   Review of Systems     Objective:   Physical Exam  Nursing note and vitals reviewed. Constitutional: She is oriented to person, place, and time.  Cardiovascular: Intact distal pulses.   Neurological: She is oriented to person, place, and time.  Skin: Skin is warm.   incision sites are healing well left foot from postop pin removal x2 exostectomy and cyst removal     Assessment:     Well-healing surgical sites post surgery left foot one-week duration    Plan:     Reviewed x-rays and reapplied sterile dressing to the left foot with instructions on continued immobilization. Reappoint in 2 weeks earlier if any issues should occur

## 2012-11-27 ENCOUNTER — Encounter: Payer: Self-pay | Admitting: Podiatry

## 2012-11-27 ENCOUNTER — Ambulatory Visit (INDEPENDENT_AMBULATORY_CARE_PROVIDER_SITE_OTHER): Payer: BC Managed Care – PPO | Admitting: Podiatry

## 2012-11-27 ENCOUNTER — Ambulatory Visit: Payer: Self-pay

## 2012-11-27 VITALS — BP 144/84 | HR 64 | Resp 16

## 2012-11-27 DIAGNOSIS — Z9889 Other specified postprocedural states: Secondary | ICD-10-CM

## 2012-11-27 NOTE — Progress Notes (Signed)
Patient presents to office for POV. I removed the sutures from 1st met left. Pt was scheduled to see Dr. Charlsie Merles today but had another follow up appt across town and had to leave. Will follow up with Dr. Charlsie Merles as needed.

## 2012-11-27 NOTE — Progress Notes (Signed)
Subjective:     Patient ID: [name] [name], female   DOB: 17-Aug-1945, 67 y.o.   MRN: [id number]  HPI patient had stitches accomplished and then had to leave and was not able to see me today   Review of Systems     Objective:   Physical Exam Asst. states that the incision site looks good    Assessment:     Healing from postop surgery left foot    Plan:     Stitches removed by Asst. and we will call patient to reappoint

## 2013-01-15 NOTE — Progress Notes (Signed)
1) Removal fixation (2 pins) left foot 2) Excision ganglion cyst left foot 3) Tarsal exostectomy left foot

## 2013-03-02 ENCOUNTER — Encounter: Payer: Self-pay | Admitting: Podiatry

## 2014-12-09 ENCOUNTER — Ambulatory Visit (INDEPENDENT_AMBULATORY_CARE_PROVIDER_SITE_OTHER): Payer: Medicare Other | Admitting: Podiatry

## 2014-12-09 ENCOUNTER — Encounter: Payer: Self-pay | Admitting: Podiatry

## 2014-12-09 ENCOUNTER — Ambulatory Visit (INDEPENDENT_AMBULATORY_CARE_PROVIDER_SITE_OTHER): Payer: Medicare Other

## 2014-12-09 VITALS — BP 142/85 | HR 68 | Resp 16

## 2014-12-09 DIAGNOSIS — M779 Enthesopathy, unspecified: Secondary | ICD-10-CM

## 2014-12-09 DIAGNOSIS — M79672 Pain in left foot: Secondary | ICD-10-CM | POA: Diagnosis not present

## 2014-12-09 DIAGNOSIS — B351 Tinea unguium: Secondary | ICD-10-CM

## 2014-12-09 MED ORDER — TRIAMCINOLONE ACETONIDE 10 MG/ML IJ SUSP
10.0000 mg | Freq: Once | INTRAMUSCULAR | Status: AC
  Administered 2014-12-09: 10 mg

## 2014-12-10 NOTE — Progress Notes (Signed)
Subjective:     Patient ID: [name] [name], female   DOB: 12/17/1945, 69 y.o.   MRN: [id number]  HPI patient presents with a lot of pain in the left forefoot stating it's been hurting her for a little while and is worsened over the last week with inflammation noted and also states that she's concerned about discoloration of her big toenail   Review of Systems  All other systems reviewed and are negative.      Objective:   Physical Exam  Constitutional: She is oriented to person, place, and time.  Cardiovascular: Intact distal pulses.   Musculoskeletal: Normal range of motion.  Neurological: She is oriented to person, place, and time.  Skin: Skin is warm.  Nursing note and vitals reviewed.  neurovascular status found to be intact with muscle strength adequate range of motion within normal limits with patient noted to have inflammation and fluid around the forefoot left mostly centered around the third metatarsal phalangeal joint. No lifting of the toe was noted currently and there is good digital perfusion and she's well oriented 3. 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    Plan:     Reviewed H&P and x-rays and discussed condition. At this point I recommended aspiration injection and explained risk and she wants this performed. I did a proximal nerve block of the area I aspirated the joint getting out a small amount of clear fluid and injected with half cc of dexamethasone Kenalog and applied thick plantar padding to take pressure off the joint. Start topical antifungal and discussed possibility for laser of the right big toenail

## 2014-12-11 ENCOUNTER — Telehealth: Payer: Self-pay | Admitting: *Deleted

## 2014-12-11 NOTE — Telephone Encounter (Signed)
Pt asked if a rx was to be called in after her last visit.  I reviewed pt's 12/09/2014, no medications were prescribed and Dr. Paulla Dolly wanted to see how she progressed after the in office treatment.  Left message.

## 2014-12-16 ENCOUNTER — Ambulatory Visit (INDEPENDENT_AMBULATORY_CARE_PROVIDER_SITE_OTHER): Payer: Medicare Other | Admitting: Podiatry

## 2014-12-16 ENCOUNTER — Encounter: Payer: Self-pay | Admitting: Podiatry

## 2014-12-16 DIAGNOSIS — B351 Tinea unguium: Secondary | ICD-10-CM | POA: Diagnosis not present

## 2014-12-16 DIAGNOSIS — M779 Enthesopathy, unspecified: Secondary | ICD-10-CM

## 2014-12-17 NOTE — Progress Notes (Signed)
Subjective:     Patient ID: [name] [name], female   DOB: [birth year], 69 y.o.   MRN: [id number]  HPI patient states that it feels some better but it is still sore   Review of Systems     Objective:   Physical Exam Neurovascular status intact muscle strength adequate with continued discomfort around the second metatarsophalangeal joint with edema present but reduced from previous visit    Assessment:     Inflammatory capsulitis that has improved but is still present second MPJ    Plan:     Reviewed most likely the orthotics that will be necessary to reduce the long-term pressure against this area and at this time I re-padded the area and instructed on padding therapy for 1 month along with not going barefoot and if symptoms persist orthotics will be necessary

## 2015-02-04 ENCOUNTER — Encounter: Payer: Self-pay | Admitting: Cardiology

## 2015-06-11 DIAGNOSIS — H26492 Other secondary cataract, left eye: Secondary | ICD-10-CM | POA: Diagnosis not present

## 2015-06-11 DIAGNOSIS — Z961 Presence of intraocular lens: Secondary | ICD-10-CM | POA: Diagnosis not present

## 2015-06-11 DIAGNOSIS — H40013 Open angle with borderline findings, low risk, bilateral: Secondary | ICD-10-CM | POA: Diagnosis not present

## 2015-07-15 DIAGNOSIS — Z1159 Encounter for screening for other viral diseases: Secondary | ICD-10-CM | POA: Diagnosis not present

## 2015-07-15 DIAGNOSIS — Z0000 Encounter for general adult medical examination without abnormal findings: Secondary | ICD-10-CM | POA: Diagnosis not present

## 2015-07-15 DIAGNOSIS — M810 Age-related osteoporosis without current pathological fracture: Secondary | ICD-10-CM | POA: Diagnosis not present

## 2015-07-15 DIAGNOSIS — E785 Hyperlipidemia, unspecified: Secondary | ICD-10-CM | POA: Diagnosis not present

## 2015-07-15 LAB — LIPID PANEL
Cholesterol: 239 — AB (ref 0–200)
Cholesterol: 239 — AB (ref 0–200)
HDL: 62 (ref 35–70)
HDL: 62 (ref 35–70)
LDL Cholesterol: 133
LDL Cholesterol: 133
Triglycerides: 220 — AB (ref 40–160)
Triglycerides: 220 — AB (ref 40–160)

## 2015-07-15 LAB — BASIC METABOLIC PANEL
BUN: 22 — AB (ref 4–21)
BUN: 22 — AB (ref 4–21)
Creatinine: 0.8 (ref 0.5–1.1)
Glucose: 75
Glucose: 75
Potassium: 5.3 (ref 3.4–5.3)
Potassium: 5.3 (ref 3.4–5.3)
Sodium: 141 (ref 137–147)
Sodium: 141 (ref 137–147)

## 2015-07-15 LAB — HEPATIC FUNCTION PANEL
ALT: 13 (ref 7–35)
ALT: 13 (ref 7–35)
AST: 18 (ref 13–35)
AST: 18 (ref 13–35)
Alkaline Phosphatase: 54 (ref 25–125)
Alkaline Phosphatase: 54 (ref 25–125)
Bilirubin, Total: 0.2
Bilirubin, Total: 0.2

## 2015-07-16 DIAGNOSIS — L821 Other seborrheic keratosis: Secondary | ICD-10-CM | POA: Diagnosis not present

## 2015-07-16 DIAGNOSIS — L648 Other androgenic alopecia: Secondary | ICD-10-CM | POA: Diagnosis not present

## 2015-07-16 DIAGNOSIS — Z85828 Personal history of other malignant neoplasm of skin: Secondary | ICD-10-CM | POA: Diagnosis not present

## 2015-08-25 DIAGNOSIS — Z1231 Encounter for screening mammogram for malignant neoplasm of breast: Secondary | ICD-10-CM | POA: Diagnosis not present

## 2015-09-24 DIAGNOSIS — D485 Neoplasm of uncertain behavior of skin: Secondary | ICD-10-CM | POA: Diagnosis not present

## 2015-09-24 DIAGNOSIS — Z419 Encounter for procedure for purposes other than remedying health state, unspecified: Secondary | ICD-10-CM | POA: Diagnosis not present

## 2015-09-24 DIAGNOSIS — L570 Actinic keratosis: Secondary | ICD-10-CM | POA: Diagnosis not present

## 2015-09-24 DIAGNOSIS — Z85828 Personal history of other malignant neoplasm of skin: Secondary | ICD-10-CM | POA: Diagnosis not present

## 2015-09-24 DIAGNOSIS — L821 Other seborrheic keratosis: Secondary | ICD-10-CM | POA: Diagnosis not present

## 2015-09-24 DIAGNOSIS — C44319 Basal cell carcinoma of skin of other parts of face: Secondary | ICD-10-CM | POA: Diagnosis not present

## 2015-10-21 DIAGNOSIS — C44319 Basal cell carcinoma of skin of other parts of face: Secondary | ICD-10-CM | POA: Diagnosis not present

## 2015-10-21 DIAGNOSIS — Z85828 Personal history of other malignant neoplasm of skin: Secondary | ICD-10-CM | POA: Diagnosis not present

## 2015-10-29 DIAGNOSIS — Z23 Encounter for immunization: Secondary | ICD-10-CM | POA: Diagnosis not present

## 2016-01-30 DIAGNOSIS — J0100 Acute maxillary sinusitis, unspecified: Secondary | ICD-10-CM | POA: Diagnosis not present

## 2016-02-18 DIAGNOSIS — J101 Influenza due to other identified influenza virus with other respiratory manifestations: Secondary | ICD-10-CM | POA: Diagnosis not present

## 2016-02-24 DIAGNOSIS — J101 Influenza due to other identified influenza virus with other respiratory manifestations: Secondary | ICD-10-CM | POA: Diagnosis not present

## 2016-03-12 DIAGNOSIS — J0101 Acute recurrent maxillary sinusitis: Secondary | ICD-10-CM | POA: Diagnosis not present

## 2016-03-25 DIAGNOSIS — Z85828 Personal history of other malignant neoplasm of skin: Secondary | ICD-10-CM | POA: Diagnosis not present

## 2016-03-25 DIAGNOSIS — C4401 Basal cell carcinoma of skin of lip: Secondary | ICD-10-CM | POA: Diagnosis not present

## 2016-03-25 DIAGNOSIS — L570 Actinic keratosis: Secondary | ICD-10-CM | POA: Diagnosis not present

## 2016-03-25 DIAGNOSIS — D485 Neoplasm of uncertain behavior of skin: Secondary | ICD-10-CM | POA: Diagnosis not present

## 2016-03-25 DIAGNOSIS — L821 Other seborrheic keratosis: Secondary | ICD-10-CM | POA: Diagnosis not present

## 2016-05-19 DIAGNOSIS — L821 Other seborrheic keratosis: Secondary | ICD-10-CM | POA: Diagnosis not present

## 2016-05-19 DIAGNOSIS — Z85828 Personal history of other malignant neoplasm of skin: Secondary | ICD-10-CM | POA: Diagnosis not present

## 2016-05-19 DIAGNOSIS — L648 Other androgenic alopecia: Secondary | ICD-10-CM | POA: Diagnosis not present

## 2016-07-13 DIAGNOSIS — L649 Androgenic alopecia, unspecified: Secondary | ICD-10-CM | POA: Diagnosis not present

## 2016-07-13 DIAGNOSIS — L821 Other seborrheic keratosis: Secondary | ICD-10-CM | POA: Diagnosis not present

## 2016-07-13 DIAGNOSIS — Z85828 Personal history of other malignant neoplasm of skin: Secondary | ICD-10-CM | POA: Diagnosis not present

## 2016-07-28 DIAGNOSIS — H26491 Other secondary cataract, right eye: Secondary | ICD-10-CM | POA: Diagnosis not present

## 2016-07-28 DIAGNOSIS — H18413 Arcus senilis, bilateral: Secondary | ICD-10-CM | POA: Diagnosis not present

## 2016-07-28 DIAGNOSIS — H40003 Preglaucoma, unspecified, bilateral: Secondary | ICD-10-CM | POA: Diagnosis not present

## 2016-07-28 DIAGNOSIS — H26493 Other secondary cataract, bilateral: Secondary | ICD-10-CM | POA: Diagnosis not present

## 2016-08-05 ENCOUNTER — Telehealth: Payer: Self-pay

## 2016-08-05 DIAGNOSIS — E2839 Other primary ovarian failure: Secondary | ICD-10-CM

## 2016-08-05 NOTE — Telephone Encounter (Signed)
Relation to AF:OADL Call back [place]   Reason for call:  Patient requesting bone density orders please send to bone density [facility]

## 2016-08-10 NOTE — Telephone Encounter (Signed)
done

## 2016-08-10 NOTE — Addendum Note (Signed)
Addended by: Lamar Blinks C on: 08/10/2016 01:29 PM   Modules accepted: Orders

## 2016-08-27 DIAGNOSIS — Z1231 Encounter for screening mammogram for malignant neoplasm of breast: Secondary | ICD-10-CM | POA: Diagnosis not present

## 2016-08-27 DIAGNOSIS — M8589 Other specified disorders of bone density and structure, multiple sites: Secondary | ICD-10-CM | POA: Diagnosis not present

## 2016-08-27 DIAGNOSIS — Z803 Family history of malignant neoplasm of breast: Secondary | ICD-10-CM | POA: Diagnosis not present

## 2016-08-29 NOTE — Progress Notes (Addendum)
[place] at [facility] [address], [place], [place] [address] [phone]  Date:  08/30/2016   Name:  [name] [name]   DOB:  1945-05-10   MRN:  [id number]  PCP:  Darreld Mclean, MD    Chief Complaint: Establish Care   History of Present Illness:  [name] [name] is a 71 y.o. very pleasant female patient who presents with the following:  Here today as a new patient to my practice; her previous doctor left her practice Per her outside clinic CPE from last year:  Patient presents with  . Annual Exam  pt is NON fasting   Patient presents for annual exam. Is doing well in general. Is not fasting for labs.  Osteoporosis- Doing well with boniva, denies any adverse effects. Taking monthly as directed.  Hyperlipidemia- doing well with Zetia. Tries to maintain an active and healthy lifestyle. Denies adverse effects with medication.  Needs:    Labs  Mammogram, next month- patient to schedule  Bone density in 2018  Health Maintenance  Topic Date Due  . MEDICARE ANNUAL WELLNESS 01/03/2013  . MAMMOGRAM 08/21/2015  . DEXA SCAN 08/19/2016  . COLONOSCOPY 05/04/2022   Immunization History  Administered Date(s) Administered  . Influenza Adult Quad Pfree 10/03/2013  . Influenza High Dose 10/15/2014   Annual eye exam:does not need  Lifestyle:  Body mass index is 25.79 kg/(m^2). 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  She will get out of breath with climibing stairs.  She sometimes will have some burning type chest pressure when she lies down at night only- otherwise no chest pain  Never dx with any heart issues She did do a stress test at some point but this was years ago Her father did die of an MI at 84- he was a heavy smoker Her mother died at 78 with cancer  She is engaged and her fiance does have children and also grandkids   Dr Fontaine No is her derm and referred her here   Last labs about a year ago She is not fasting- she had one egg and 1 piece of toast  She does take finasteride for her hair to prevent thinning, and she also takes a hair/ skin and nails   Patient Active Problem List   Diagnosis Date Noted  . FATIGUE 09/12/2009  . MYALGIA 10/31/2008  . DYSPNEA ON EXERTION 10/31/2008  . Acute sinusitis, unspecified 12/05/2006  . HYPERLIPIDEMIA 11/11/2006  . OTHER SPECIFIED ANEMIAS 11/11/2006  . GERD 11/11/2006  . OSTEOPOROSIS 11/11/2006  . IRRITABLE BOWEL SYNDROME, HX OF 10/20/2006  Past Medical History:  Diagnosis Date  . Hyperlipidemia   . Osteoporosis     Past Surgical History:  Procedure Laterality Date  . BUNIONECTOMY    . SKIN CANCER EXCISION      Social History  Substance Use Topics  . Smoking status: Never Smoker  . Smokeless tobacco: Never Used  . Alcohol use No    No family history on file.  Allergies  Allergen Reactions  . Bee Venom Swelling    Medication list has been reviewed and updated.  Current Outpatient Prescriptions on File Prior to Visit  Medication Sig Dispense Refill  . Biotin 5000 MCG CAPS Take 5 mg by mouth.    . ibandronate (BONIVA) 150 MG tablet Take 150 mg by mouth every 30 (thirty) days. Take in the  morning with a full glass of water, on an empty stomach, and do not take anything else by mouth or lie down for the next 30 min.    . meloxicam (MOBIC) 15 MG tablet TAKE 1 TABLET (15 MG TOTAL) BY MOUTH DAILY.    Marland Kitchen ezetimibe (ZETIA) 10 MG tablet Take 10 mg by mouth.     No current facility-administered medications on file prior to visit.     Review of Systems:  As per HPI- otherwise negative. No LE edema She has noted decreased exercise capacity for about 6 months Distant history of bee sting allergy as a young child    Physical Examination: Vitals:   08/30/16 1016  BP: 129/86  Pulse: 83  Temp: 98.6 F (37 C)  SpO2: 97%   Vitals:   08/30/16 1016  Weight: 150 lb 3.2 oz (68.1 kg)  Height: 5\' 2"  (1.575 m)   Body mass index is 27.47 kg/m. Ideal Body Weight: Weight in (lb) to have BMI = 25: 136.4  GEN: WDWN, NAD, Non-toxic, A & O x 3 HEENT: Atraumatic, Normocephalic. Neck supple. No masses, No LAD. Bilateral TM wnl, oropharynx normal.  PEERL,EOMI.   Ears and Nose: No external deformity. CV: RRR, No M/G/R. No JVD. No thrill. No extra heart sounds. PULM: CTA B, no wheezes, crackles, rhonchi. No retractions. No resp. distress. No accessory muscle use. ABD: S, NT, ND. No rebound. No HSM. EXTR: No c/c/e NEURO Normal gait.  PSYCH: Normally interactive. Conversant. Not depressed or anxious appearing.  Calm demeanor.  Looks very well and younger than age. Overweight- mild  EKG: normal, rate of 77, no ST elevation or depression  Assessment and Plan: Shortness of breath on exertion - Plan: CBC, EKG 12-Lead, Exercise Tolerance Test  Encounter for hepatitis C screening test for low risk patient - Plan: Hepatitis C antibody  Screening for diabetes mellitus - Plan: Comprehensive metabolic panel, Hemoglobin A1c  Screening for deficiency anemia - Plan: CBC  Screening for hyperlipidemia - Plan: Lipid panel  Hair loss - Plan: TSH  Here today as a new patient She has noted some  weight gain and hair changes- TSH today Also regular screening labs as above She has noted some vaginal iching- will try an OTC monistat pack, let me know if not helpful for her She notes decreased exercise tolerance over the last 6 months or so- will schedule for a treadmill test and be in touch pending the results She admits that since she retired she does not do a whole lot, does not like exercise.  She will try to find ways to be more active   Signed Lamar Blinks, MD  Received her labs 8/28 Results for orders  placed or performed in visit on 08/30/16  CBC  Result Value Ref Range   WBC 7.5 4.0 - 10.5 K/uL   RBC 4.07 3.87 - 5.11 Mil/uL   Platelets 257.0 150.0 - 400.0 K/uL   Hemoglobin 12.8 12.0 - 15.0 g/dL   HCT 38.6 36.0 - 46.0 %   MCV 94.9 78.0 - 100.0 fl   MCHC 33.2 30.0 - 36.0 g/dL   RDW 13.2 11.5 - 15.5 %  Comprehensive metabolic panel  Result Value Ref Range   Sodium 140 135 - 145 mEq/L   Potassium 4.7 3.5 - 5.1 mEq/L   Chloride 104 96 - 112 mEq/L   CO2 31 19 - 32 mEq/L   Glucose, Bld 85 70 - 99 mg/dL   BUN 22 6 - 23 mg/dL   Creatinine, Ser 0.85 0.40 - 1.20 mg/dL   Total Bilirubin 0.3 0.2 - 1.2 mg/dL   Alkaline Phosphatase 45 39 - 117 U/L   AST 17 0 - 37 U/L   ALT 11 0 - 35 U/L   Total Protein 7.3 6.0 - 8.3 g/dL   Albumin 4.2 3.5 - 5.2 g/dL   Calcium 10.5 8.4 - 10.5 mg/dL   GFR 70.55 >60.00 mL/min  Lipid panel  Result Value Ref Range   Cholesterol 278 (H) 0 - 200 mg/dL   Triglycerides 245.0 (H) 0.0 - 149.0 mg/dL   HDL 53.50 >39.00 mg/dL   VLDL 49.0 (H) 0.0 - 40.0 mg/dL   Total CHOL/HDL Ratio 5    NonHDL 224.47   Hemoglobin A1c  Result Value Ref Range   Hgb A1c MFr Bld 5.4 4.6 - 6.5 %  Hepatitis C antibody  Result Value Ref Range   HCV Ab NON-REACTIVE NON-REACTIVE  TSH  Result Value Ref Range   TSH 2.35 0.35 - 4.50 uIU/mL  LDL cholesterol, direct  Result Value Ref Range   Direct LDL 166.0 mg/dL   Will have her work on diet and weight loss, repeat  FLP in 6 months

## 2016-08-30 ENCOUNTER — Ambulatory Visit (INDEPENDENT_AMBULATORY_CARE_PROVIDER_SITE_OTHER): Payer: Medicare Other | Admitting: Family Medicine

## 2016-08-30 VITALS — BP 129/86 | HR 83 | Temp 98.6°F | Ht 62.0 in | Wt 150.2 lb

## 2016-08-30 DIAGNOSIS — Z1159 Encounter for screening for other viral diseases: Secondary | ICD-10-CM

## 2016-08-30 DIAGNOSIS — Z131 Encounter for screening for diabetes mellitus: Secondary | ICD-10-CM | POA: Diagnosis not present

## 2016-08-30 DIAGNOSIS — R0602 Shortness of breath: Secondary | ICD-10-CM | POA: Diagnosis not present

## 2016-08-30 DIAGNOSIS — Z13220 Encounter for screening for lipoid disorders: Secondary | ICD-10-CM | POA: Diagnosis not present

## 2016-08-30 DIAGNOSIS — L659 Nonscarring hair loss, unspecified: Secondary | ICD-10-CM | POA: Diagnosis not present

## 2016-08-30 DIAGNOSIS — H26492 Other secondary cataract, left eye: Secondary | ICD-10-CM | POA: Diagnosis not present

## 2016-08-30 DIAGNOSIS — Z961 Presence of intraocular lens: Secondary | ICD-10-CM | POA: Diagnosis not present

## 2016-08-30 DIAGNOSIS — Z130 Encounter for screening for diseases of the blood and blood-forming organs and certain disorders involving the immune mechanism: Secondary | ICD-10-CM | POA: Diagnosis not present

## 2016-08-30 LAB — LIPID PANEL
Cholesterol: 278 mg/dL — ABNORMAL HIGH (ref 0–200)
HDL: 53.5 mg/dL (ref >39.00)
NonHDL: 224.47
Total CHOL/HDL Ratio: 5
Triglycerides: 245 mg/dL — ABNORMAL HIGH (ref 0.0–149.0)
VLDL: 49 mg/dL — ABNORMAL HIGH (ref 0.0–40.0)

## 2016-08-30 LAB — COMPREHENSIVE METABOLIC PANEL
ALT: 11 U/L (ref 0–35)
AST: 17 U/L (ref 0–37)
Albumin: 4.2 g/dL (ref 3.5–5.2)
Alkaline Phosphatase: 45 U/L (ref 39–117)
BUN: 22 mg/dL (ref 6–23)
CO2: 31 mEq/L (ref 19–32)
Calcium: 10.5 mg/dL (ref 8.4–10.5)
Chloride: 104 mEq/L (ref 96–112)
Creatinine, Ser: 0.85 mg/dL (ref 0.40–1.20)
GFR: 70.55 mL/min (ref >60.00)
Glucose, Bld: 85 mg/dL (ref 70–99)
Potassium: 4.7 mEq/L (ref 3.5–5.1)
Sodium: 140 mEq/L (ref 135–145)
Total Bilirubin: 0.3 mg/dL (ref 0.2–1.2)
Total Protein: 7.3 g/dL (ref 6.0–8.3)

## 2016-08-30 LAB — CBC
HCT: 38.6 % (ref 36.0–46.0)
Hemoglobin: 12.8 g/dL (ref 12.0–15.0)
MCHC: 33.2 g/dL (ref 30.0–36.0)
MCV: 94.9 fl (ref 78.0–100.0)
Platelets: 257 10*3/uL (ref 150.0–400.0)
RBC: 4.07 Mil/uL (ref 3.87–5.11)
RDW: 13.2 % (ref 11.5–15.5)
WBC: 7.5 10*3/uL (ref 4.0–10.5)

## 2016-08-30 LAB — HEMOGLOBIN A1C: Hgb A1c MFr Bld: 5.4 % (ref 4.6–6.5)

## 2016-08-30 LAB — TSH: TSH: 2.35 u[IU]/mL (ref 0.35–4.50)

## 2016-08-30 LAB — LDL CHOLESTEROL, DIRECT: Direct LDL: 166 mg/dL

## 2016-08-30 NOTE — Patient Instructions (Addendum)
It was a pleasure to see you today- I will look out for your records from your previous office We will be in touch with your labs asap and we will arrange for a treadmill test for you. This will help [place] make sure that your heart is in good shape. Assuming your treadmill looks good, I would encourage you to gradually increase your exercise

## 2016-08-31 LAB — HEPATITIS C ANTIBODY: HCV Ab: NONREACTIVE

## 2016-09-07 DIAGNOSIS — Z961 Presence of intraocular lens: Secondary | ICD-10-CM | POA: Diagnosis not present

## 2016-09-08 ENCOUNTER — Encounter: Payer: Self-pay | Admitting: Family Medicine

## 2016-09-24 ENCOUNTER — Telehealth: Payer: Self-pay | Admitting: *Deleted

## 2016-09-24 NOTE — Telephone Encounter (Signed)
Received Medical records from [facility]; forwarded to provider/SLS 09/21

## 2016-09-28 ENCOUNTER — Telehealth: Payer: Self-pay | Admitting: Family Medicine

## 2016-09-28 NOTE — Telephone Encounter (Signed)
Received records from [facility] and will abstract/ scan as needed

## 2016-09-30 ENCOUNTER — Encounter: Payer: Self-pay | Admitting: Family Medicine

## 2016-10-01 ENCOUNTER — Other Ambulatory Visit: Payer: Self-pay | Admitting: Family Medicine

## 2016-10-01 NOTE — Telephone Encounter (Signed)
Received refill request for meloxicam (MOBIC) 15 MG tablet. Last office visit 08/30/16 and last refill 09/02/2014. Is it ok to refill? Please advise.

## 2016-10-19 ENCOUNTER — Ambulatory Visit (INDEPENDENT_AMBULATORY_CARE_PROVIDER_SITE_OTHER): Payer: Medicare Other

## 2016-10-19 ENCOUNTER — Telehealth: Payer: Self-pay | Admitting: Family Medicine

## 2016-10-19 DIAGNOSIS — R0602 Shortness of breath: Secondary | ICD-10-CM | POA: Diagnosis not present

## 2016-10-19 LAB — EXERCISE TOLERANCE TEST
Estimated workload: 6.1 METS
Exercise duration (min): 4 min
Exercise duration (sec): 19 s
MPHR: 152 {beats}/min
Peak HR: 153 {beats}/min
Percent HR: 100 %
RPE: 16
Rest HR: 74 {beats}/min

## 2016-10-19 NOTE — Telephone Encounter (Signed)
Received her ETT results and gave her a call  Blood pressure demonstrated a hypertensive response to exercise.  There was no ST segment deviation noted during stress.   1. Hypertensive BP response.  2. Below average exercise capacity.  3. No evidence for ischemia by ST segment analysis.   Gave her the good news that her heart does not show any sign of ischemia with exercise. She is ok to gradually build up her exercise program She wonders why she is so tired all the time.  She is not sure if she may snore- declines a sleep study at this time.  Invited her to come and see me to discuss this complex issue in more detail at her convenience

## 2016-11-16 ENCOUNTER — Other Ambulatory Visit: Payer: Self-pay | Admitting: Emergency Medicine

## 2016-11-16 MED ORDER — EZETIMIBE 10 MG PO TABS: 10.0000 mg | ORAL_TABLET | Freq: Every day | ORAL | 2 refills | Status: DC

## 2017-01-27 DIAGNOSIS — R5383 Other fatigue: Secondary | ICD-10-CM | POA: Diagnosis not present

## 2017-01-27 DIAGNOSIS — Z85828 Personal history of other malignant neoplasm of skin: Secondary | ICD-10-CM | POA: Diagnosis not present

## 2017-01-27 DIAGNOSIS — L659 Nonscarring hair loss, unspecified: Secondary | ICD-10-CM | POA: Diagnosis not present

## 2017-01-27 DIAGNOSIS — D1801 Hemangioma of skin and subcutaneous tissue: Secondary | ICD-10-CM | POA: Diagnosis not present

## 2017-01-27 DIAGNOSIS — L821 Other seborrheic keratosis: Secondary | ICD-10-CM | POA: Diagnosis not present

## 2017-03-31 ENCOUNTER — Ambulatory Visit (HOSPITAL_BASED_OUTPATIENT_CLINIC_OR_DEPARTMENT_OTHER)
Admission: RE | Admit: 2017-03-31 | Discharge: 2017-03-31 | Disposition: A | Discharge status: Home / Self Care | Payer: Medicare Other | Source: Ambulatory Visit | Attending: Family Medicine | Admitting: Family Medicine

## 2017-03-31 ENCOUNTER — Encounter: Payer: Self-pay | Admitting: Family Medicine

## 2017-03-31 ENCOUNTER — Ambulatory Visit: Payer: Medicare Other | Admitting: Family Medicine

## 2017-03-31 ENCOUNTER — Telehealth: Payer: Self-pay

## 2017-03-31 VITALS — BP 142/90 | HR 93 | Temp 99.0°F | Ht 62.0 in | Wt 154.0 lb

## 2017-03-31 DIAGNOSIS — G8929 Other chronic pain: Secondary | ICD-10-CM

## 2017-03-31 DIAGNOSIS — M4185 Other forms of scoliosis, thoracolumbar region: Secondary | ICD-10-CM | POA: Diagnosis not present

## 2017-03-31 DIAGNOSIS — M545 Low back pain, unspecified: Secondary | ICD-10-CM

## 2017-03-31 DIAGNOSIS — M5136 Other intervertebral disc degeneration, lumbar region: Secondary | ICD-10-CM | POA: Diagnosis not present

## 2017-03-31 DIAGNOSIS — M546 Pain in thoracic spine: Secondary | ICD-10-CM | POA: Diagnosis not present

## 2017-03-31 MED ORDER — CYCLOBENZAPRINE HCL 10 MG PO TABS: ORAL_TABLET | ORAL | 0 refills | Status: DC

## 2017-03-31 NOTE — Telephone Encounter (Signed)
PA initiated via Covermymeds; KEY: [id number]. Awaiting determination.

## 2017-03-31 NOTE — Patient Instructions (Signed)
Good to see you today, but I am sorry that your back is bothering you!   Please have x-rays taken of your back today (imaging dept, on the ground floor) and I will be in touch with your reports asap  You can try taking flexeril at bedtime- start with a 1/2 pill- for 7- 10 days.  This may be helpful Other things to try Yoga Tens unit Heat Muscle rubs like ben-gay  Let me know if not helpful and in that case we might want to try formal physical therapy

## 2017-03-31 NOTE — Progress Notes (Signed)
[place] at [facility] [address], [place], [place] [address] [phone]  Date:  03/31/2017   Name:  Zera Markwardt   DOB:  [birth year]   MRN:  [id number]  PCP:  Darreld Mclean, MD    Chief Complaint: No chief complaint on file.   History of Present Illness:  Kierah Goatley is a 72 y.o. very pleasant female patient who presents with the following:  History of hyperlipidemia, osteoporosis, IBS Here today with concern of back pain- this is a recurrent issue for her.   She has had intermittently back issues for years and years- seems to be getting worse however and is more frequently bothersome recently.   He back hurts in her mid to lower back, bilaterally  No radiation into her legs, no numbness or weakness.   No bowel or bladder incontint It hurts more when she changes position like sits down or stands up.  Also bending down ispainful She had some mobic in the past- however she could not really tell if it helped her at all She will use some OTC vitamins but no analgesics  I last saw her in August with SOB  She generally avoids coming to see me and does not really like to go out much Encouraged her to have a physical soon  Patient Active Problem List   Diagnosis Date Noted  . FATIGUE 09/12/2009  . MYALGIA 10/31/2008  . DYSPNEA ON EXERTION 10/31/2008  . Acute sinusitis, unspecified 12/05/2006  . HYPERLIPIDEMIA 11/11/2006  . OTHER SPECIFIED ANEMIAS 11/11/2006  . GERD 11/11/2006  . OSTEOPOROSIS 11/11/2006  . IRRITABLE BOWEL SYNDROME, HX OF 10/20/2006    Past Medical History:  Diagnosis Date  . Hyperlipidemia   . Osteoporosis     Past Surgical History:  Procedure Laterality Date  . BUNIONECTOMY    . SKIN CANCER EXCISION      Social History   Tobacco Use  . Smoking status: Never Smoker  . Smokeless tobacco: Never Used  Substance Use Topics  . Alcohol use: No  . Drug use: No    No family history on  file.  Allergies  Allergen Reactions  . Bee Venom Swelling    Medication list has been reviewed and updated.  Current Outpatient Medications on File Prior to Visit  Medication Sig Dispense Refill  . Biotin 5000 MCG CAPS Take 5 mg by mouth.    . ezetimibe (ZETIA) 10 MG tablet Take 1 tablet (10 mg total) daily by mouth. 90 tablet 2  . finasteride (PROPECIA) 1 MG tablet Take 1 mg by mouth daily.    Marland Kitchen ibandronate (BONIVA) 150 MG tablet Take 150 mg by mouth every 30 (thirty) days. Take in the morning with a full glass of water, on an empty stomach, and do not take anything else by mouth or lie down for the next 30 min.    . meloxicam (MOBIC) 15 MG tablet Take 1 tablet (15 mg total) by mouth daily as needed. 90 tablet 0   No current facility-administered medications on file prior to visit.     Review of Systems:    Physical Examination: Vitals:   03/31/17 1054  BP: (!) 142/90  Pulse: 93  Temp: 99 F (37.2 C)  SpO2: 94%   Vitals:   03/31/17 1054  Weight: 154 lb (69.9 kg)  Height: 5\' 2"  (1.575 m)   Body mass index is 28.17 kg/m. Ideal Body Weight: Weight in (lb) to  have BMI = 25: 136.4  GEN: WDWN, NAD, Non-toxic, A & O x 3, mild overweight, looks well  HEENT: Atraumatic, Normocephalic. Neck supple. No masses, No LAD.  Bilateral TM wnl, oropharynx normal.  PEERL,EOMI.   Ears and Nose: No external deformity. CV: RRR, No M/G/R. No JVD. No thrill. No extra heart sounds. PULM: CTA B, no wheezes, crackles, rhonchi. No retractions. No resp. distress. No accessory muscle use. ABD: S, NT, ND EXTR: No c/c/e NEURO Normal gait.  PSYCH: Normally interactive. Conversant. Not depressed or anxious appearing.  Calm demeanor.  Back- she has mild scoliosis apparent on exam. paraspinous muscles right side at approx T12 level in spasm and tender Normal thoracolumbar ROM Normal BLE strength and sensation, normal DTR  Dg Thoracic Spine 2 View  Result Date: 03/31/2017 CLINICAL DATA:  Back  pain. EXAM: THORACIC SPINE 2 VIEWS COMPARISON:  None FINDINGS: There is a scoliosis deformity involving the lower thoracic and lumbar spine. The apex of the curvature is at the L2 level. The thoracic vertebral body heights are well preserved. No fractures identified. Degenerative changes are noted within the visualized portions of the lumbar spine. IMPRESSION: 1. Lower thoracic and lumbar scoliosis. Electronically Signed   By: Kerby Moors M.D.   On: 03/31/2017 11:53    Assessment and Plan: Chronic bilateral low back pain without sciatica - Plan: DG Lumbar Spine Complete, DG Thoracic Spine 2 View, cyclobenzaprine (FLEXERIL) 10 MG tablet  Called pt to go over her reports. Scoliosis and some degenerative change but not dramatic Had offered PT, but she she would like to try yoga first which is fine.  See patient instructions for more details.     Signed Lamar Blinks, MD

## 2017-04-01 ENCOUNTER — Telehealth: Payer: Self-pay | Admitting: Family Medicine

## 2017-04-01 NOTE — Telephone Encounter (Signed)
PA approved. Effective from 03/31/2017 through 04/01/2018

## 2017-04-01 NOTE — Telephone Encounter (Signed)
Copied from [place] [phone]. Topic: Quick Communication - See Telephone Encounter >> Apr 01, 2017 11:00 AM Ahmed Prima L wrote: CRM for notification. See Telephone encounter for: 04/01/17.  [employer] called and said they approved the cyclobenzaprine (FLEXERIL) 10 MG tablet.

## 2017-04-07 DIAGNOSIS — Z85828 Personal history of other malignant neoplasm of skin: Secondary | ICD-10-CM | POA: Diagnosis not present

## 2017-04-07 DIAGNOSIS — D485 Neoplasm of uncertain behavior of skin: Secondary | ICD-10-CM | POA: Diagnosis not present

## 2017-04-07 DIAGNOSIS — L570 Actinic keratosis: Secondary | ICD-10-CM | POA: Diagnosis not present

## 2017-04-07 DIAGNOSIS — L853 Xerosis cutis: Secondary | ICD-10-CM | POA: Diagnosis not present

## 2017-04-07 DIAGNOSIS — C44319 Basal cell carcinoma of skin of other parts of face: Secondary | ICD-10-CM | POA: Diagnosis not present

## 2017-04-07 DIAGNOSIS — D0462 Carcinoma in situ of skin of left upper limb, including shoulder: Secondary | ICD-10-CM | POA: Diagnosis not present

## 2017-04-28 DIAGNOSIS — C44319 Basal cell carcinoma of skin of other parts of face: Secondary | ICD-10-CM | POA: Diagnosis not present

## 2017-04-28 DIAGNOSIS — Z85828 Personal history of other malignant neoplasm of skin: Secondary | ICD-10-CM | POA: Diagnosis not present

## 2017-05-05 DIAGNOSIS — L820 Inflamed seborrheic keratosis: Secondary | ICD-10-CM | POA: Diagnosis not present

## 2017-05-05 DIAGNOSIS — D0462 Carcinoma in situ of skin of left upper limb, including shoulder: Secondary | ICD-10-CM | POA: Diagnosis not present

## 2017-05-05 DIAGNOSIS — L821 Other seborrheic keratosis: Secondary | ICD-10-CM | POA: Diagnosis not present

## 2017-05-05 DIAGNOSIS — Z85828 Personal history of other malignant neoplasm of skin: Secondary | ICD-10-CM | POA: Diagnosis not present

## 2017-05-20 DIAGNOSIS — L649 Androgenic alopecia, unspecified: Secondary | ICD-10-CM | POA: Diagnosis not present

## 2017-05-20 DIAGNOSIS — L821 Other seborrheic keratosis: Secondary | ICD-10-CM | POA: Diagnosis not present

## 2017-05-20 DIAGNOSIS — Z85828 Personal history of other malignant neoplasm of skin: Secondary | ICD-10-CM | POA: Diagnosis not present

## 2017-07-23 NOTE — Progress Notes (Addendum)
[place] at [facility] [address] [place], [place] [address] [phone]  Date:  07/25/2017   Name:  [name] [name]   DOB:  01/09/1945   MRN:  [id number]  PCP:  Darreld Mclean, MD    Chief Complaint: Medication Refill (pt states she has alzhiemers, needs help knowing where to go and what she needs to do) and Back Pain   History of Present Illness:  [name] [name] is a 72 y.o. very pleasant female patient who presents with the following: I have known this patient for about one year.  Here today for a follow-up visit and refills  Last seen by myself in March: History of hyperlipidemia, osteoporosis, IBS Here today with concern of back pain- this is a recurrent issue for her.   She has had intermittently back issues for years and years- seems to be getting worse however and is more frequently bothersome recently.   He back hurts in her mid to lower back, bilaterally  No radiation into her legs, no numbness or weakness.   No bowel or bladder incontint It hurts more when she changes position like sits down or stands up.  Also bending down ispainful She had some mobic in the past- however she could not really tell if it helped her at all She will use some OTC vitamins but no analgesics  I last saw her in August with SOB  She generally avoids coming to see me and does not really like to go out much Encouraged her to have a physical soon  Due for full labs today- she is not fasting  Immun: start pneumonia series, ?tetanus- we will update for her today  Mammo:  8/18 Colon:  She thinks that she did have this done at some point but cannot remember where or when this might have been done  Pap:  She stopped at age 81, never had any abnormal   She has decided that she has Alzheimer's disease  She reports that she did see neurology years ago, but they told her that she was normal She reports that she does not have any family or  friends, and that she does not really have contact with anyone on a regular basis who could report any memory problems.    She is engaged to someone named [name] for 21 years, but "I don't see him very often."  She sees him maybe 3x a week. 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 After discussion we decided to stop this medication  Patient Active Problem List   Diagnosis Date Noted  . FATIGUE 09/12/2009  . MYALGIA 10/31/2008  . DYSPNEA ON EXERTION 10/31/2008  . HYPERLIPIDEMIA 11/11/2006  . OTHER SPECIFIED ANEMIAS 11/11/2006  . GERD 11/11/2006  . OSTEOPOROSIS  11/11/2006  . IRRITABLE BOWEL SYNDROME, HX OF 10/20/2006    Past Medical History:  Diagnosis Date  . Hyperlipidemia   . Osteoporosis     Past Surgical History:  Procedure Laterality Date  . BUNIONECTOMY    . SKIN CANCER EXCISION      Social History   Tobacco Use  . Smoking status: Never Smoker  . Smokeless tobacco: Never Used  Substance Use Topics  . Alcohol use: No  . Drug use: No    No family history on file.  Allergies  Allergen Reactions  . Bee Venom Swelling    Medication list has been reviewed and updated.  Current Outpatient Medications on File Prior to Visit  Medication Sig Dispense Refill  . finasteride (PROSCAR) 5 MG tablet Take 1/3 tablet by mouth daily     No current facility-administered  medications on file prior to visit.     Review of Systems: No fever or chills No CP or SOB  As per HPI- otherwise negative.   Physical Examination: Vitals:   07/25/17 1101  BP: 132/88  Pulse: 88  Resp: 16  SpO2: 98%   Vitals:   07/25/17 1101  Weight: 153 lb (69.4 kg)  Height: 5\' 2"  (1.575 m)   Body mass index is 27.98 kg/m. Ideal Body Weight: Weight in (lb) to have BMI = 25: 136.4  GEN: WDWN, NAD, Non-toxic, A & O x 3, looks well, normal weight, neatly groomed and dressed  HEENT: Atraumatic, Normocephalic. Neck supple. No masses, No LAD. 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 CV: RRR, No M/G/R. No JVD. No thrill. No extra heart sounds. PULM: CTA B, no wheezes, crackles, rhonchi. No retractions. No resp. distress. No accessory muscle use. ABD: S, NT, ND, +BS. No rebound. No HSM. EXTR: No c/c/e NEURO Normal gait.  PSYCH: Normally interactive. Conversant. Not depressed or anxious appearing.  Calm demeanor.    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 Some of her behavior suggest FLD.  Referral to neurology today for evaluation Updated her immunizations  Referral to oral surgery Refilled zetia Check labs today   Signed Lamar Blinks, MD   Received her labs 7/23 Results for orders placed or performed in visit on 07/25/17  CBC  Result Value Ref Range   WBC  8.7 4.0 - 10.5 K/uL   RBC 4.32 3.87 - 5.11 Mil/uL   Platelets 289.0 150.0 - 400.0 K/uL   Hemoglobin 13.8 12.0 - 15.0 g/dL   HCT 40.9 36.0 - 46.0 %   MCV 94.6 78.0 - 100.0 fl   MCHC 33.8 30.0 - 36.0 g/dL   RDW 13.0 11.5 - 15.5 %  Comprehensive metabolic panel  Result Value Ref Range   Sodium 140 135 - 145 mEq/L   Potassium 4.9 3.5 - 5.1 mEq/L   Chloride 102 96 - 112 mEq/L   CO2 30 19 - 32 mEq/L   Glucose, Bld 87 70 - 99  mg/dL   BUN 15 6 - 23 mg/dL   Creatinine, Ser 0.93 0.40 - 1.20 mg/dL   Total Bilirubin 0.3 0.2 - 1.2 mg/dL   Alkaline Phosphatase 58 39 - 117 U/L   AST 15 0 - 37 U/L   ALT 15 0 - 35 U/L   Total Protein 7.5 6.0 - 8.3 g/dL   Albumin 4.3 3.5 - 5.2 g/dL   Calcium 10.3 8.4 - 10.5 mg/dL   GFR 63.43 >60.00 mL/min  Hemoglobin A1c  Result Value Ref Range   Hgb A1c MFr Bld 5.6 4.6 - 6.5 %  Lipid panel  Result Value Ref Range   Cholesterol 277 (H) 0 - 200 mg/dL   Triglycerides 258.0 (H) 0.0 - 149.0 mg/dL   HDL 60.50 >39.00 mg/dL   VLDL 51.6 (H) 0.0 - 40.0 mg/dL   Total CHOL/HDL Ratio 5    NonHDL 216.74   LDL cholesterol, direct  Result Value Ref Range   Direct LDL 184.0 mg/dL   Letter to pt

## 2017-07-25 ENCOUNTER — Ambulatory Visit: Payer: Medicare Other | Admitting: Family Medicine

## 2017-07-25 ENCOUNTER — Encounter: Payer: Self-pay | Admitting: Family Medicine

## 2017-07-25 VITALS — BP 132/88 | HR 88 | Resp 16 | Ht 62.0 in | Wt 153.0 lb

## 2017-07-25 DIAGNOSIS — E785 Hyperlipidemia, unspecified: Secondary | ICD-10-CM

## 2017-07-25 DIAGNOSIS — D6489 Other specified anemias: Secondary | ICD-10-CM

## 2017-07-25 DIAGNOSIS — Z1211 Encounter for screening for malignant neoplasm of colon: Secondary | ICD-10-CM | POA: Diagnosis not present

## 2017-07-25 DIAGNOSIS — Z130 Encounter for screening for diseases of the blood and blood-forming organs and certain disorders involving the immune mechanism: Secondary | ICD-10-CM

## 2017-07-25 DIAGNOSIS — M270 Developmental disorders of jaws: Secondary | ICD-10-CM

## 2017-07-25 DIAGNOSIS — Z23 Encounter for immunization: Secondary | ICD-10-CM

## 2017-07-25 DIAGNOSIS — Z131 Encounter for screening for diabetes mellitus: Secondary | ICD-10-CM | POA: Diagnosis not present

## 2017-07-25 DIAGNOSIS — R413 Other amnesia: Secondary | ICD-10-CM

## 2017-07-25 LAB — LIPID PANEL
Cholesterol: 277 mg/dL — ABNORMAL HIGH (ref 0–200)
HDL: 60.5 mg/dL (ref >39.00)
NonHDL: 216.74
Total CHOL/HDL Ratio: 5
Triglycerides: 258 mg/dL — ABNORMAL HIGH (ref 0.0–149.0)
VLDL: 51.6 mg/dL — ABNORMAL HIGH (ref 0.0–40.0)

## 2017-07-25 LAB — CBC
HCT: 40.9 % (ref 36.0–46.0)
Hemoglobin: 13.8 g/dL (ref 12.0–15.0)
MCHC: 33.8 g/dL (ref 30.0–36.0)
MCV: 94.6 fl (ref 78.0–100.0)
Platelets: 289 10*3/uL (ref 150.0–400.0)
RBC: 4.32 Mil/uL (ref 3.87–5.11)
RDW: 13 % (ref 11.5–15.5)
WBC: 8.7 10*3/uL (ref 4.0–10.5)

## 2017-07-25 LAB — HEMOGLOBIN A1C: Hgb A1c MFr Bld: 5.6 % (ref 4.6–6.5)

## 2017-07-25 LAB — COMPREHENSIVE METABOLIC PANEL
ALT: 15 U/L (ref 0–35)
AST: 15 U/L (ref 0–37)
Albumin: 4.3 g/dL (ref 3.5–5.2)
Alkaline Phosphatase: 58 U/L (ref 39–117)
BUN: 15 mg/dL (ref 6–23)
CO2: 30 mEq/L (ref 19–32)
Calcium: 10.3 mg/dL (ref 8.4–10.5)
Chloride: 102 mEq/L (ref 96–112)
Creatinine, Ser: 0.93 mg/dL (ref 0.40–1.20)
GFR: 63.43 mL/min (ref >60.00)
Glucose, Bld: 87 mg/dL (ref 70–99)
Potassium: 4.9 mEq/L (ref 3.5–5.1)
Sodium: 140 mEq/L (ref 135–145)
Total Bilirubin: 0.3 mg/dL (ref 0.2–1.2)
Total Protein: 7.5 g/dL (ref 6.0–8.3)

## 2017-07-25 LAB — LDL CHOLESTEROL, DIRECT: Direct LDL: 184 mg/dL

## 2017-07-25 MED ORDER — EZETIMIBE 10 MG PO TABS: 10.0000 mg | ORAL_TABLET | Freq: Every day | ORAL | 3 refills | Status: DC

## 2017-07-25 NOTE — Patient Instructions (Addendum)
It was nice to see you today We will refer you to neurology to discuss your memory concerns Will also refer you to oral surgery to discuss the mass on the roof of your mouth   You got a tetanus booster and pneumonia shot today We will set up Cologuard which is an at home test you do to screen for colon cancer

## 2017-07-29 ENCOUNTER — Encounter: Payer: Self-pay | Admitting: Psychology

## 2017-07-29 ENCOUNTER — Ambulatory Visit: Payer: Medicare Other | Admitting: Diagnostic Neuroimaging

## 2017-07-29 ENCOUNTER — Encounter: Payer: Self-pay | Admitting: Diagnostic Neuroimaging

## 2017-07-29 DIAGNOSIS — R413 Other amnesia: Secondary | ICD-10-CM | POA: Diagnosis not present

## 2017-07-29 NOTE — Patient Instructions (Signed)
MEMORY LOSS (new problem, addl workup) - MRI brain - neuropsychology testing   - TSH, B12 - brain health activities reviewed (nutrition, exercise, compensation techniques)

## 2017-07-29 NOTE — Progress Notes (Signed)
[facility]  PATIENT: [name] [name] DOB: 08-12-1945  REFERRING CLINICIAN: J Copland HISTORY FROM: patient  REASON FOR VISIT: new consult    HISTORICAL  CHIEF COMPLAINT:  Chief Complaint  Patient presents with  . NP  Copeland  . Memory Loss    HISTORY OF PRESENT ILLNESS:   72 year old female here for evaluation of memory loss.  Patient reports new onset of word finding difficulties, difficulty with navigation and driving directions, since January 2019.  She also reports significant decreased motivation, losing interest in her activities, feeling more isolated.  Patient reports a small social circle, mainly spends time by herself at home.  She has a fianc, has been engaged for 21 years, and spends about 2 days a week with fianc mainly at lunchtime.  Patient lives alone.  She is able to take care of her own personal activities of daily living, bathing, dressing, hygiene.  She also is able to take care of her bills, light household chores, shopping, driving.  She has some back pain which limits some of her physical activities.  She has recently hired someone to take care of outside yard work.  Patient reports concern that her friends are minimizing her own memory loss issue, when they say things like "we also have the same problems".  Patient remembers how she used to say the same thing to her father when he was alive.  It is unclear whether her father had dementia or not.  Patient is retired.  She previously worked at [employer], and retired at age 38.    REVIEW OF SYSTEMS: Full 14 system review of systems performed and negative with exception of: Memory loss confusion depression too much sleep decreased energy fatigue.   ALLERGIES: Allergies  Allergen Reactions  . Bee Venom Swelling    HOME MEDICATIONS: Outpatient Medications Prior to Visit  Medication Sig Dispense Refill  . ezetimibe (ZETIA) 10 MG tablet Take 1 tablet (10 mg total) by mouth daily. 90  tablet 3  . finasteride (PROPECIA) 1 MG tablet TK 1 T PO D  11  . finasteride (PROSCAR) 5 MG tablet Take 1/3 tablet by mouth daily     No facility-administered medications prior to visit.     PAST MEDICAL HISTORY: Past Medical History:  Diagnosis Date  . Hyperlipidemia   . Osteoporosis     PAST SURGICAL HISTORY: Past Surgical History:  Procedure Laterality Date  . BUNIONECTOMY    . SKIN CANCER EXCISION      FAMILY HISTORY: No family history on file.  SOCIAL HISTORY: Social History   Socioeconomic History  . Marital status: Single    Spouse name: Not on file  . Number of children: Not on file  . Years of education: Not on file  . Highest education level: Not on file  Occupational History  . Not on file  Social Needs  . Financial resource strain: Not on file  . Food insecurity:    Worry: Not on file    Inability: Not on file  . Transportation needs:    Medical: Not on file    Non-medical: Not on file  Tobacco Use  . Smoking status: Never Smoker  . Smokeless tobacco: Never Used  Substance and Sexual Activity  . Alcohol use: No  . Drug use: No  . Sexual activity: Not on file  Lifestyle  . Physical activity:    Days per week: Not on file    Minutes per session: Not on file  . Stress:  Not on file  Relationships  . Social connections:    Talks on phone: Not on file    Gets together: Not on file    Attends religious service: Not on file    Active member of club or organization: Not on file    Attends meetings of clubs or organizations: Not on file    Relationship status: Not on file  . Intimate partner violence:    Fear of current or ex partner: Not on file    Emotionally abused: Not on file    Physically abused: Not on file    Forced sexual activity: Not on file  Other Topics Concern  . Not on file  Social History Narrative   Lives home with home.  Not working.  Education:  HS grad.  [name].       PHYSICAL EXAM  GENERAL  EXAM/CONSTITUTIONAL: Vitals:  Vitals:   07/29/17 0826  BP: (!) 144/88  Pulse: 90  Weight: 153 lb 6.4 oz (69.6 kg)  Height: 5\' 2"  (1.575 m)     Body mass index is 28.06 kg/m. Wt Readings from Last 3 Encounters:  07/29/17 153 lb 6.4 oz (69.6 kg)  07/25/17 153 lb (69.4 kg)  03/31/17 154 lb (69.9 kg)     Patient is in no distress; well developed, nourished and groomed; neck is supple  CARDIOVASCULAR:  Examination of carotid arteries is normal; no carotid bruits  Regular rate and rhythm, no murmurs  Examination of peripheral vascular system by observation and palpation is normal  EYES:  Ophthalmoscopic exam of optic discs and posterior segments is normal; no papilledema or hemorrhages  Visual Acuity Screening   Right eye Left eye Both eyes  Without correction: 20/40 20/40   With correction:        MUSCULOSKELETAL:  Gait, strength, tone, movements noted in Neurologic exam below  NEUROLOGIC: MENTAL STATUS:  MMSE - Mini Mental State Exam 07/29/2017  Orientation to time 5  Orientation to Place 4  Registration 3  Attention/ Calculation 1  Recall 3  Language- name 2 objects 2  Language- repeat 1  Language- follow 3 step command 3  Language- read & follow direction 1  Write a sentence 1  Copy design 1  Total score 25    awake, alert, oriented to person, place and time  recent and remote memory intact  normal attention and concentration  language fluent, comprehension intact, naming intact  fund of knowledge appropriate  CRANIAL NERVE:   2nd - no papilledema on fundoscopic exam  2nd, 3rd, 4th, 6th - pupils equal and reactive to light, visual fields full to confrontation, extraocular muscles intact, no nystagmus  5th - facial sensation symmetric  7th - facial strength symmetric  8th - hearing intact  9th - palate elevates symmetrically, uvula midline  11th - shoulder shrug symmetric  12th - tongue protrusion midline  MOTOR:   normal bulk and  tone, full strength in the BUE, BLE  SENSORY:   normal and symmetric to light touch, temperature, vibration  COORDINATION:   finger-nose-finger, fine finger movements normal  REFLEXES:   deep tendon reflexes present and symmetric  GAIT/STATION:   narrow based gait; able to walk tandem; romberg is negative     DIAGNOSTIC DATA (LABS, IMAGING, TESTING) - I reviewed patient records, labs, notes, testing and imaging myself where available.  Lab Results  Component Value Date   WBC 8.7 07/25/2017   HGB 13.8 07/25/2017   HCT 40.9 07/25/2017  MCV 94.6 07/25/2017   PLT 289.0 07/25/2017      Component Value Date/Time   NA 140 07/25/2017 1151   NA 141 07/15/2015   NA 141 07/15/2015   K 4.9 07/25/2017 1151   CL 102 07/25/2017 1151   CO2 30 07/25/2017 1151   GLUCOSE 87 07/25/2017 1151   BUN 15 07/25/2017 1151   BUN 22 (A) 07/15/2015   BUN 22 (A) 07/15/2015   CREATININE 0.93 07/25/2017 1151   CALCIUM 10.3 07/25/2017 1151   PROT 7.5 07/25/2017 1151   ALBUMIN 4.3 07/25/2017 1151   AST 15 07/25/2017 1151   ALT 15 07/25/2017 1151   ALKPHOS 58 07/25/2017 1151   BILITOT 0.3 07/25/2017 1151   GFRNONAA 76.23 09/12/2009 1413   GFRAA 94 02/20/2008 0000   Lab Results  Component Value Date   CHOL 277 (H) 07/25/2017   HDL 60.50 07/25/2017   LDLCALC 133 07/15/2015   LDLCALC 133 07/15/2015   LDLDIRECT 184.0 07/25/2017   TRIG 258.0 (H) 07/25/2017   CHOLHDL 5 07/25/2017   Lab Results  Component Value Date   HGBA1C 5.6 07/25/2017   Lab Results  Component Value Date   VITAMINB12 641 09/12/2009   Lab Results  Component Value Date   TSH 2.35 08/30/2016        ASSESSMENT AND PLAN  72 y.o. year old female here with subjective memory loss since Jan 2019. No significant change or loss of ADLs due to memory. Some decline in activities, likely related to underlying depression and loss of motivation / interest. Will proceed with further workup.    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 Memory loss      PLAN:  MEMORY LOSS (new problem, addl workup) - MRI brain - neuropsychology testing   - TSH, B12 - brain health activities reviewed (nutrition, exercise, compensation techniques) - monitor depression; may need eval and treatment  Orders Placed This Encounter  Procedures  . MR BRAIN WO CONTRAST  . Vitamin B12  . TSH  . Ambulatory referral to Neuropsychology   Return in about 6 months (around 01/29/2018) for with NP.    Penni Bombard, MD 9/35/7017, 7:93 AM Certified in Neurology, Neurophysiology and Neuroimaging  [facility] [address], [place], [place] [address] [phone]

## 2017-07-30 LAB — TSH: TSH: 2.08 u[IU]/mL (ref 0.450–4.500)

## 2017-07-30 LAB — VITAMIN B12: Vitamin B-12: 733 pg/mL (ref 232–1245)

## 2017-08-01 ENCOUNTER — Telehealth: Payer: Self-pay | Admitting: *Deleted

## 2017-08-01 NOTE — Telephone Encounter (Signed)
-----   Message from Penni Bombard, MD sent at 08/01/2017 10:36 AM EDT ----- Normal labs. Please call patient. -VRP

## 2017-08-01 NOTE — Telephone Encounter (Signed)
I spoke to pt and relayed that the lab results came back normal.  She verbalized understanding.

## 2017-08-05 DIAGNOSIS — Z1212 Encounter for screening for malignant neoplasm of rectum: Secondary | ICD-10-CM | POA: Diagnosis not present

## 2017-08-05 DIAGNOSIS — Z1211 Encounter for screening for malignant neoplasm of colon: Secondary | ICD-10-CM | POA: Diagnosis not present

## 2017-08-05 LAB — COLOGUARD

## 2017-08-09 DIAGNOSIS — M9903 Segmental and somatic dysfunction of lumbar region: Secondary | ICD-10-CM | POA: Diagnosis not present

## 2017-08-09 DIAGNOSIS — M47816 Spondylosis without myelopathy or radiculopathy, lumbar region: Secondary | ICD-10-CM | POA: Diagnosis not present

## 2017-08-10 DIAGNOSIS — M9903 Segmental and somatic dysfunction of lumbar region: Secondary | ICD-10-CM | POA: Diagnosis not present

## 2017-08-10 DIAGNOSIS — M47816 Spondylosis without myelopathy or radiculopathy, lumbar region: Secondary | ICD-10-CM | POA: Diagnosis not present

## 2017-08-11 DIAGNOSIS — M9903 Segmental and somatic dysfunction of lumbar region: Secondary | ICD-10-CM | POA: Diagnosis not present

## 2017-08-11 DIAGNOSIS — M47816 Spondylosis without myelopathy or radiculopathy, lumbar region: Secondary | ICD-10-CM | POA: Diagnosis not present

## 2017-08-15 DIAGNOSIS — M9903 Segmental and somatic dysfunction of lumbar region: Secondary | ICD-10-CM | POA: Diagnosis not present

## 2017-08-15 DIAGNOSIS — M47816 Spondylosis without myelopathy or radiculopathy, lumbar region: Secondary | ICD-10-CM | POA: Diagnosis not present

## 2017-08-16 ENCOUNTER — Telehealth: Payer: Self-pay | Admitting: Diagnostic Neuroimaging

## 2017-08-16 DIAGNOSIS — M9903 Segmental and somatic dysfunction of lumbar region: Secondary | ICD-10-CM | POA: Diagnosis not present

## 2017-08-16 DIAGNOSIS — M47816 Spondylosis without myelopathy or radiculopathy, lumbar region: Secondary | ICD-10-CM | POA: Diagnosis not present

## 2017-08-16 NOTE — Telephone Encounter (Signed)
[employer] Josem Kaufmann: [id number] (exp. 08/16/17 to 09/14/17) pt is scheduled at GI for 08/22/17.

## 2017-08-17 ENCOUNTER — Encounter: Payer: Self-pay | Admitting: Family Medicine

## 2017-08-17 DIAGNOSIS — M47816 Spondylosis without myelopathy or radiculopathy, lumbar region: Secondary | ICD-10-CM | POA: Diagnosis not present

## 2017-08-17 DIAGNOSIS — M9903 Segmental and somatic dysfunction of lumbar region: Secondary | ICD-10-CM | POA: Diagnosis not present

## 2017-08-18 DIAGNOSIS — M9903 Segmental and somatic dysfunction of lumbar region: Secondary | ICD-10-CM | POA: Diagnosis not present

## 2017-08-18 DIAGNOSIS — M47816 Spondylosis without myelopathy or radiculopathy, lumbar region: Secondary | ICD-10-CM | POA: Diagnosis not present

## 2017-08-22 ENCOUNTER — Encounter (HOSPITAL_COMMUNITY): Payer: Self-pay | Admitting: Emergency Medicine

## 2017-08-22 ENCOUNTER — Telehealth: Payer: Self-pay | Admitting: Neurology

## 2017-08-22 ENCOUNTER — Emergency Department (HOSPITAL_COMMUNITY): Payer: Medicare Other

## 2017-08-22 ENCOUNTER — Ambulatory Visit
Admission: RE | Admit: 2017-08-22 | Discharge: 2017-08-22 | Disposition: A | Discharge status: Home / Self Care | Payer: Medicare Other | Source: Ambulatory Visit | Attending: Diagnostic Neuroimaging | Admitting: Diagnostic Neuroimaging

## 2017-08-22 ENCOUNTER — Telehealth: Payer: Self-pay | Admitting: Diagnostic Neuroimaging

## 2017-08-22 ENCOUNTER — Emergency Department (HOSPITAL_COMMUNITY)
Admission: EM | Admit: 2017-08-22 | Discharge: 2017-08-22 | Disposition: A | Discharge status: Home / Self Care | Payer: Medicare Other | Attending: Emergency Medicine | Admitting: Emergency Medicine

## 2017-08-22 ENCOUNTER — Other Ambulatory Visit: Payer: Self-pay

## 2017-08-22 DIAGNOSIS — D329 Benign neoplasm of meninges, unspecified: Secondary | ICD-10-CM | POA: Insufficient documentation

## 2017-08-22 DIAGNOSIS — G9389 Other specified disorders of brain: Secondary | ICD-10-CM | POA: Diagnosis not present

## 2017-08-22 DIAGNOSIS — R900 Intracranial space-occupying lesion found on diagnostic imaging of central nervous system: Secondary | ICD-10-CM | POA: Insufficient documentation

## 2017-08-22 DIAGNOSIS — Z85828 Personal history of other malignant neoplasm of skin: Secondary | ICD-10-CM | POA: Diagnosis not present

## 2017-08-22 DIAGNOSIS — Z79899 Other long term (current) drug therapy: Secondary | ICD-10-CM | POA: Insufficient documentation

## 2017-08-22 DIAGNOSIS — R413 Other amnesia: Secondary | ICD-10-CM | POA: Diagnosis not present

## 2017-08-22 DIAGNOSIS — I10 Essential (primary) hypertension: Secondary | ICD-10-CM | POA: Diagnosis not present

## 2017-08-22 LAB — PROTIME-INR
INR: 0.9
Prothrombin Time: 12.1 seconds (ref 11.4–15.2)

## 2017-08-22 LAB — CBC WITH DIFFERENTIAL/PLATELET
Abs Immature Granulocytes: 0 10*3/uL (ref 0.0–0.1)
Basophils Absolute: 0.1 10*3/uL (ref 0.0–0.1)
Basophils Relative: 1 %
Eosinophils Absolute: 0.2 10*3/uL (ref 0.0–0.7)
Eosinophils Relative: 2 %
HCT: 41.2 % (ref 36.0–46.0)
Hemoglobin: 13.5 g/dL (ref 12.0–15.0)
Immature Granulocytes: 0 %
Lymphocytes Relative: 18 %
Lymphs Abs: 1.8 10*3/uL (ref 0.7–4.0)
MCH: 31.2 pg (ref 26.0–34.0)
MCHC: 32.8 g/dL (ref 30.0–36.0)
MCV: 95.2 fL (ref 78.0–100.0)
Monocytes Absolute: 0.8 10*3/uL (ref 0.1–1.0)
Monocytes Relative: 8 %
Neutro Abs: 7.3 10*3/uL (ref 1.7–7.7)
Neutrophils Relative %: 71 %
Platelets: 270 10*3/uL (ref 150–400)
RBC: 4.33 MIL/uL (ref 3.87–5.11)
RDW: 12.3 % (ref 11.5–15.5)
WBC: 10.3 10*3/uL (ref 4.0–10.5)

## 2017-08-22 LAB — BASIC METABOLIC PANEL
Anion gap: 11 (ref 5–15)
BUN: 17 mg/dL (ref 8–23)
CO2: 25 mmol/L (ref 22–32)
Calcium: 9.8 mg/dL (ref 8.9–10.3)
Chloride: 103 mmol/L (ref 98–111)
Creatinine, Ser: 0.93 mg/dL (ref 0.44–1.00)
GFR calc Af Amer: >60 mL/min (ref >60)
GFR calc non Af Amer: >60 mL/min (ref >60)
Glucose, Bld: 92 mg/dL (ref 70–99)
Potassium: 4 mmol/L (ref 3.5–5.1)
Sodium: 139 mmol/L (ref 135–145)

## 2017-08-22 MED ORDER — IOPAMIDOL (ISOVUE-300) INJECTION 61%
INTRAVENOUS | Status: AC
  Filled 2017-08-22: qty 30

## 2017-08-22 MED ORDER — GADOBENATE DIMEGLUMINE 529 MG/ML IV SOLN
15.0000 mL | Freq: Once | INTRAVENOUS | Status: AC
  Administered 2017-08-22: 15 mL via INTRAVENOUS

## 2017-08-22 MED ORDER — DEXAMETHASONE SODIUM PHOSPHATE 10 MG/ML IJ SOLN
10.0000 mg | Freq: Once | INTRAMUSCULAR | Status: AC
  Administered 2017-08-22: 10 mg via INTRAVENOUS
  Filled 2017-08-22: qty 1

## 2017-08-22 MED ORDER — DEXAMETHASONE 4 MG PO TABS: 4.0000 mg | ORAL_TABLET | Freq: Four times a day (QID) | ORAL | 0 refills | Status: AC

## 2017-08-22 NOTE — Telephone Encounter (Signed)
Dr Jaynee Eagles received a call from [facility] [place] re: patient's MRI brain scan showed a mass. Dr Jaynee Eagles reviewed MRI with Dr Leta Baptist; she advised [facility] to send patient to the hospital.

## 2017-08-22 NOTE — ED Triage Notes (Signed)
Pt arrives via EMS from [facility], reports memory loss for last 7 months. Had xray showing frontal lobe tumor. Denies pain. Alert, oriented x4, bp 210/118.

## 2017-08-22 NOTE — ED Notes (Signed)
Patient transported to MRI 

## 2017-08-22 NOTE — Telephone Encounter (Signed)
I tried calling patient to see how she is doing with her test results and ER evaluation. I will try again tomorrow. -VRP

## 2017-08-22 NOTE — Consult Note (Signed)
Reason for Consult: Memory loss Referring Physician: Emergency department  Kylene Zamarron is an 72 y.o. female.  HPI: 72 year old female with an unknown duration of memory loss mostly for recent events her long-term memory seems to be intact.  She had not been experiencing any headaches nausea vomiting any vision changes any weakness or numbness and one-sided body or the other.  She was being evaluated by a neurologist underwent an outpatient MRI scan today that showed a large left frontal mass and patient was immediately called and sent to the ER.  Past Medical History:  Diagnosis Date  . Hyperlipidemia   . Osteoporosis     Past Surgical History:  Procedure Laterality Date  . BREAST ENHANCEMENT SURGERY    . BUNIONECTOMY    . SKIN CANCER EXCISION      History reviewed. No pertinent family history.  Social History:  reports that she has never smoked. She has never used smokeless tobacco. She reports that she does not drink alcohol or use drugs.  Allergies:  Allergies  Allergen Reactions  . Bee Venom Swelling    Medications: I have reviewed the patient's current medications.  Results for orders placed or performed during the hospital encounter of 08/22/17 (from the past 48 hour(s))  Basic metabolic panel     Status: None   Collection Time: 08/22/17  1:55 PM  Result Value Ref Range   Sodium 139 135 - 145 mmol/L   Potassium 4.0 3.5 - 5.1 mmol/L   Chloride 103 98 - 111 mmol/L   CO2 25 22 - 32 mmol/L   Glucose, Bld 92 70 - 99 mg/dL   BUN 17 8 - 23 mg/dL   Creatinine, Ser 0.93 0.44 - 1.00 mg/dL   Calcium 9.8 8.9 - 10.3 mg/dL   GFR calc non Af Amer >60 >60 mL/min   GFR calc Af Amer >60 >60 mL/min    Comment: (NOTE) The eGFR has been calculated using the CKD EPI equation. This calculation has not been validated in all clinical situations. eGFR's persistently <60 mL/min signify possible Chronic Kidney Disease.    Anion gap 11 5 - 15    Comment: Performed at [facility] [address] [place], [place] [address]  CBC with Differential     Status: None   Collection Time: 08/22/17  1:55 PM  Result Value Ref Range   WBC 10.3 4.0 - 10.5 K/uL   RBC 4.33 3.87 - 5.11 MIL/uL   Hemoglobin 13.5 12.0 - 15.0 g/dL   HCT 41.2 36.0 - 46.0 %   MCV 95.2 78.0 - 100.0 fL   MCH 31.2 26.0 - 34.0 pg   MCHC 32.8 30.0 - 36.0 g/dL   RDW 12.3 11.5 - 15.5 %   Platelets 270 150 - 400 K/uL   Neutrophils Relative % 71 %   Neutro Abs 7.3 1.7 - 7.7 K/uL   Lymphocytes Relative 18 %   Lymphs Abs 1.8 0.7 - 4.0 K/uL   Monocytes Relative 8 %   Monocytes Absolute 0.8 0.1 - 1.0 K/uL   Eosinophils Relative 2 %   Eosinophils Absolute 0.2 0.0 - 0.7 K/uL   Basophils Relative 1 %   Basophils Absolute 0.1 0.0 - 0.1 K/uL   Immature Granulocytes 0 %   Abs Immature Granulocytes 0.0 0.0 - 0.1 K/uL    Comment: Performed at [facility], [place] [address], [place], [place] [address]  Protime-INR     Status: None   Collection Time: 08/22/17  1:55 PM  Result Value Ref Range   Prothrombin Time 12.1 11.4 - 15.2 seconds   INR 0.90     Comment: Performed at [facility], [place] [address] [place], [place] [address]    Mr Brain Wo Contrast  Result Date: 08/22/2017 CLINICAL DATA:  Memory loss. EXAM: MRI HEAD WITHOUT CONTRAST TECHNIQUE: Multiplanar, multiecho pulse sequences of the brain and surrounding structures were obtained without intravenous contrast. COMPARISON:  None. FINDINGS: Brain: A large left frontal mass measures 6.4 x 4.2 x 5.2 cm with heterogeneous internal T1 and T2 signal intensity and evidence of some associated blood products. The mass abuts the falx as well as the dura over the anterior frontal convexity and may be extra-axial. There is extensive vasogenic edema throughout the left frontal lobe which extends into the left external capsule. There is rightward midline shift which measures 10 mm with partial effacement of the left greater than right lateral ventricles. A  separate mass involves the left cavernous sinus, sella, and medial aspect of the left middle cranial fossa and measures approximately 2.9 x 2.5 cm, partially encasing the left ICA. There is no acute infarct. A chronic microhemorrhage is noted in the splenium of the corpus callosum on the left. No extra-axial fluid collection is evident. Scattered small foci of T2 hyperintensity in the cerebral white matter bilaterally are nonspecific but compatible with mild chronic small vessel ischemic disease. There is no evidence of age advanced cerebral atrophy. Vascular: Major intracranial vascular flow voids are preserved. Skull and upper cervical spine: Possible direct involvement of the frontal skull by the left frontal mass and/or reactive osseous sclerosis. Sinuses/Orbits: Bilateral cataract extraction. Paranasal sinuses and mastoid air cells are clear. Other: None. IMPRESSION: 1. 6.4 cm complex left frontal mass, favored to be extra-axial. Extensive vasogenic edema with 10 mm rightward midline shift. Dr. Jaynee Eagles was contacted by the MRI technologist and, after reviewing the images, advised that the patient be sent to the emergency department for further evaluation including postcontrast brain MRI. 2. 3 cm mass involving the left cavernous sinus and sella, likely meningioma. Electronically Signed   By: Logan Bores M.D.   On: 08/22/2017 13:25    Review of Systems  Psychiatric/Behavioral: Positive for memory loss.   Blood pressure (!) 156/82, pulse 81, temperature 98 F (36.7 C), resp. rate 20, height '5\' 2"'$  (1.575 m), weight 70.3 kg, SpO2 95 %. Physical Exam  Constitutional: She is oriented to person, place, and time. She appears well-developed and well-nourished.  HENT:  Head: Normocephalic.  Eyes: Pupils are equal, round, and reactive to light.  Neurological: She is alert and oriented to person, place, and time. She has normal strength. GCS eye subscore is 4. GCS verbal subscore is 5. GCS motor subscore is 6.   Patient is awake and alert pupils are equal extraocular moves are intact strength is 5 out of 5 upper and lower extremities with no pronator drift.  Cranial nerves are otherwise intact naming also appears to be intact    Assessment/Plan: 72 year old female with no significant past medical history with memory loss and underwent an outpatient noncontrasted MRI scan that shows a large left frontal what probably is an extra-axial mass and also a suprasellar and left cavernous sinus mass.  Both these masses image consistent with a meningioma however will await contrasted MRI scan for full determination.  If it images consistent with a meningioma, patient may be discharged on steroids with outpatient follow-up to schedule resection.  If it suspicious  for something other than meningioma patient will need to be admitted to medicine and undergo full metastatic work-up.  Commend 10 mg of IV Decadron in the ER notify us when the contrasted MRI is done we will discuss disposition.  Beila Purdie P 08/22/2017, 6:59 PM

## 2017-08-22 NOTE — ED Provider Notes (Signed)
[facility] EMERGENCY DEPARTMENT Provider Note   CSN: [id number] Arrival date & time: 08/22/17  1331     History   Chief Complaint Chief Complaint  Patient presents with  . Memory Loss    HPI [name] [name] is a 72 y.o. female.  72 year old female with medical history as below presents for evaluation of brain mass. Patient sent directly from [place]. She reports several months of memory loss. Outpatient MRI ordered by Dr. Lisbeth Ply (Neuro). Imaging performed today and patient sent directly to ED for evaluation by Neurosurgery.  Patient is without specific acute complaint. She denies fever, headache, nausea, vomiting, vision change, or focal weakness.   The history is provided by the patient.  Illness  This is a new problem. The current episode started 1 to 2 hours ago. The problem occurs rarely. The problem has not changed since onset.Pertinent negatives include no chest pain, no abdominal pain, no headaches and no shortness of breath. Nothing aggravates the symptoms. Nothing relieves the symptoms.    Past Medical History:  Diagnosis Date  . Hyperlipidemia   . Osteoporosis     Patient Active Problem List   Diagnosis Date Noted  . FATIGUE 09/12/2009  . MYALGIA 10/31/2008  . DYSPNEA ON EXERTION 10/31/2008  . HYPERLIPIDEMIA 11/11/2006  . OTHER SPECIFIED ANEMIAS 11/11/2006  . GERD 11/11/2006  . OSTEOPOROSIS 11/11/2006  . IRRITABLE BOWEL SYNDROME, HX OF 10/20/2006    Past Surgical History:  Procedure Laterality Date  . BREAST ENHANCEMENT SURGERY    . BUNIONECTOMY    . SKIN CANCER EXCISION       OB History   None      Home Medications    Prior to Admission medications   Medication Sig Start Date End Date Taking? Authorizing Provider  Biotin 1000 MCG CHEW Chew 1,000 mcg by mouth daily.   Yes [provider]  Calcium Carbonate-Vitamin D (CALCIUM 600/VITAMIN D) 600-400 MG-UNIT chew tablet Chew 1 tablet by mouth daily.   Yes  [provider]  ezetimibe (ZETIA) 10 MG tablet Take 1 tablet (10 mg total) by mouth daily. 07/25/17 07/25/18 Yes Copland, Gay Filler, MD  finasteride (PROPECIA) 1 MG tablet Take 1 mg by mouth daily.  07/27/17  Yes [provider]  ibandronate (BONIVA) 150 MG tablet Take 150 mg by mouth daily. 06/28/16  Yes [provider]    Family History History reviewed. No pertinent family history.  Social History Social History   Tobacco Use  . Smoking status: Never Smoker  . Smokeless tobacco: Never Used  Substance Use Topics  . Alcohol use: No  . Drug use: No     Allergies   Bee venom   Review of Systems Review of Systems  Respiratory: Negative for shortness of breath.   Cardiovascular: Negative for chest pain.  Gastrointestinal: Negative for abdominal pain.  Neurological: Negative for headaches.  All other systems reviewed and are negative.    Physical Exam Updated Vital Signs BP (!) 156/82   Pulse 75   Temp 98 F (36.7 C)   Resp 20   Ht 5\' 2"  (1.575 m)   Wt 70.3 kg   SpO2 96%   BMI 28.35 kg/m   Physical Exam  Constitutional: She is oriented to person, place, and time. She appears well-developed and well-nourished. No distress.  HENT:  Head: Normocephalic and atraumatic.  Mouth/Throat: Oropharynx is clear and moist.  Eyes: Pupils are equal, round, and reactive to light. Conjunctivae and EOM are  normal.  Neck: Normal range of motion. Neck supple.  Cardiovascular: Normal rate, regular rhythm and normal heart sounds.  Pulmonary/Chest: Effort normal and breath sounds normal. No respiratory distress.  Abdominal: Soft. She exhibits no distension. There is no tenderness.  Musculoskeletal: Normal range of motion. She exhibits no edema or deformity.  Neurological: She is alert and oriented to person, place, and time. She displays normal reflexes. No cranial nerve deficit or sensory deficit. She exhibits normal muscle tone. Coordination normal.  Skin:  Skin is warm and dry.  Psychiatric: She has a normal mood and affect.  Nursing note and vitals reviewed.    ED Treatments / Results  Labs (all labs ordered are listed, but only abnormal results are displayed) Labs Reviewed  BASIC METABOLIC PANEL  CBC WITH DIFFERENTIAL/PLATELET  PROTIME-INR    EKG None  Radiology Mr Brain Wo Contrast  Result Date: 08/22/2017 CLINICAL DATA:  Memory loss. EXAM: MRI HEAD WITHOUT CONTRAST TECHNIQUE: Multiplanar, multiecho pulse sequences of the brain and surrounding structures were obtained without intravenous contrast. COMPARISON:  None. FINDINGS: Brain: A large left frontal mass measures 6.4 x 4.2 x 5.2 cm with heterogeneous internal T1 and T2 signal intensity and evidence of some associated blood products. The mass abuts the falx as well as the dura over the anterior frontal convexity and may be extra-axial. There is extensive vasogenic edema throughout the left frontal lobe which extends into the left external capsule. There is rightward midline shift which measures 10 mm with partial effacement of the left greater than right lateral ventricles. A separate mass involves the left cavernous sinus, sella, and medial aspect of the left middle cranial fossa and measures approximately 2.9 x 2.5 cm, partially encasing the left ICA. There is no acute infarct. A chronic microhemorrhage is noted in the splenium of the corpus callosum on the left. No extra-axial fluid collection is evident. Scattered small foci of T2 hyperintensity in the cerebral white matter bilaterally are nonspecific but compatible with mild chronic small vessel ischemic disease. There is no evidence of age advanced cerebral atrophy. Vascular: Major intracranial vascular flow voids are preserved. Skull and upper cervical spine: Possible direct involvement of the frontal skull by the left frontal mass and/or reactive osseous sclerosis. Sinuses/Orbits: Bilateral cataract extraction. Paranasal sinuses and  mastoid air cells are clear. Other: None. IMPRESSION: 1. 6.4 cm complex left frontal mass, favored to be extra-axial. Extensive vasogenic edema with 10 mm rightward midline shift. Dr. Jaynee Eagles was contacted by the MRI technologist and, after reviewing the images, advised that the patient be sent to the emergency department for further evaluation including postcontrast brain MRI. 2. 3 cm mass involving the left cavernous sinus and sella, likely meningioma. Electronically Signed   By: Logan Bores M.D.   On: 08/22/2017 13:25    Procedures Procedures (including critical care time) CRITICAL CARE Performed by: Valarie Merino   Total critical care time: 30 minutes  Critical care time was exclusive of separately billable procedures and treating other patients.  Critical care was necessary to treat or prevent imminent or life-threatening deterioration.  Critical care was time spent personally by me on the following activities: development of treatment plan with patient and/or surrogate as well as nursing, discussions with consultants, evaluation of patient's response to treatment, examination of patient, obtaining history from patient or surrogate, ordering and performing treatments and interventions, ordering and review of laboratory studies, ordering and review of radiographic studies, pulse oximetry and re-evaluation of patient's condition.   Medications Ordered  in ED Medications  iopamidol (ISOVUE-300) 61 % injection (has no administration in time range)     Initial Impression / Assessment and Plan / ED Course  I have reviewed the triage vital signs and the nursing notes.  Pertinent labs & imaging results that were available during my care of the patient were reviewed by me and considered in my medical decision making (see chart for details).     MDM  Screen complete  Patient presents for evaluation of brain mass found on outpatient MRI today.   She is without acute complaint and is  neurologically intact.  Neurosurgery contacted (Dr. Saintclair Halsted). He will see the patient in the ED. He requests MRI Brain with contrast. He is suspicious that the mass is a meningioma and that further oncologic workup may not be required.   Dr. Sabra Heck aware of pending MRI and disposition.   Final Clinical Impressions(s) / ED Diagnoses   Final diagnoses:  Intracranial mass    ED Discharge Orders    None       Valarie Merino, MD 08/22/17 [phone]

## 2017-08-22 NOTE — ED Notes (Signed)
Patient walked to bathroom. Returned to room. Urine sample at bedside.

## 2017-08-22 NOTE — Telephone Encounter (Signed)
I reviewed imaging. Large, complex, heterogenous left frontal mass, possibly extra-axial, with mass effect, edema and midline shift.  I agree with referral to ER for further evaluation and treatment. -VRP

## 2017-08-22 NOTE — Progress Notes (Signed)
Patient ID: [name] [name], female   DOB: 12/12/1945, 72 y.o.   MRN: [id number]    I extensively reviewed the results of the contrasted MRI scan with the patient both left frontal and mesial left sphenoid wing lesion appear consistent with meningiomas.  We will allow the patient to go home on Decadron we will follow-up on Thursday in the office.

## 2017-08-22 NOTE — ED Provider Notes (Signed)
Has ? Brain tumor vs meningioma -  Dr.  Saintclair Halsted requets MRI with contrast to clarify He will come to see pt - f/u with NS, determine dispo.  Has seen patient, request Decadron prescription for outpatient, 4 mg every 6 hours, will see on Thursday to arrange outpatient care for this meningioma.     Noemi Chapel, MD 08/22/17 2102

## 2017-08-22 NOTE — Telephone Encounter (Signed)
Asked [facility] to send patient to the ED, spoke to the nurse at triage at [facility] to advise of patient's condition and arrival, treid caling back [facility] to ask their neuroradiologists to read but was placed on hold for a significant amount of tme - Discussed with Dr. Leta Baptist and he is going to try and reach [facility] and ask them to have neuroradiology read this stat? thanks

## 2017-08-22 NOTE — Discharge Instructions (Signed)
See the office of Dr. Saintclair Halsted on Thursday  Decadron, 4 mg every 6 hours

## 2017-08-23 ENCOUNTER — Telehealth: Payer: Self-pay

## 2017-08-23 NOTE — Telephone Encounter (Signed)
Follow up call made to patient. States she doesn't need to be seen by Dr. Lorelei Pont at this time and will call us when she does.

## 2017-08-23 NOTE — Telephone Encounter (Signed)
Pt returned call and was confused what office was calling and why. Pt stated that she was DX with brain tumor and can't remember well. She said that she has other appts that need to be made right now and if appt with PCP isn't necessary that she will wait until after the other appts.

## 2017-08-23 NOTE — Telephone Encounter (Signed)
ED follow up call made to patient. Left message for return call regarding appointment scheduling.

## 2017-08-25 ENCOUNTER — Other Ambulatory Visit: Payer: Self-pay | Admitting: Neurosurgery

## 2017-08-25 DIAGNOSIS — D329 Benign neoplasm of meninges, unspecified: Secondary | ICD-10-CM | POA: Diagnosis not present

## 2017-08-25 NOTE — Telephone Encounter (Signed)
I called patient. She is in good spirits and appreciative that we have made a diagnosis. She will meet with Dr. Saintclair Halsted this afternoon in clinic.   Penni Bombard, MD 4/81/8563, 1:49 AM Certified in Neurology, Neurophysiology and Neuroimaging  [facility] [address], [place], [place] [address] [phone]

## 2017-08-25 NOTE — Telephone Encounter (Signed)
Per hospital note, patient will follow up with neurosurgery, Dr Windy Carina office.

## 2017-08-30 ENCOUNTER — Other Ambulatory Visit (HOSPITAL_COMMUNITY): Payer: Self-pay | Admitting: Neurosurgery

## 2017-08-30 DIAGNOSIS — D329 Benign neoplasm of meninges, unspecified: Secondary | ICD-10-CM

## 2017-08-31 NOTE — Pre-Procedure Instructions (Signed)
[name] [name]  08/31/2017      [id number] - [place], [place] - [address] [place] [address] Phone: [phone] Fax: [phone]    Your procedure is scheduled on    09/07/17 Wednesday    Report to [facility] at 1000 A.M.  Call this number if you have problems the morning of surgery:  [phone]   Remember:  Do not eat or drink after midnight.     Take these medicines the morning of surgery with A SIP OF WATER -  DEXAMETHASONE    Do not wear jewelry, make-up or nail polish.  Do not wear lotions, powders, or perfumes, or deodorant.  Do not shave 48 hours prior to surgery.  Men may shave face and neck.  Do not bring valuables to the hospital.  [facility] is not responsible for any belongings or valuables.  Contacts, dentures or bridgework may not be worn into surgery.  Leave your suitcase in the car.  After surgery it may be brought to your room.  For patients admitted to the hospital, discharge time will be determined by your treatment team.  Patients discharged the day of surgery will not be allowed to drive home.   Name and phone number of your driver:    Special instructions:  [facility] - Preparing for Surgery  Before surgery, you can play an important role.  Because skin is not sterile, your skin needs to be as free of germs as possible.  You can reduce the number of germs on you skin by washing with CHG (chlorahexidine gluconate) soap before surgery.  CHG is an antiseptic cleaner which kills germs and bonds with the skin to continue killing germs even after washing.  Oral Hygiene is also important in reducing the risk of infection.  Remember to brush your teeth with your regular toothpaste the morning of surgery.  Please DO NOT use if you have an allergy to CHG or antibacterial soaps.  If your skin becomes reddened/irritated stop using the CHG and inform your nurse when  you arrive at Short Stay.  Do not shave (including legs and underarms) for at least 48 hours prior to the first CHG shower.  You may shave your face.  Please follow these instructions carefully:   1.  Shower with CHG Soap the night before surgery and the morning of Surgery.  2.  If you choose to wash your hair, wash your hair first as usual with your normal shampoo.  3.  After you shampoo, rinse your hair and body thoroughly to remove the shampoo. 4.  Use CHG as you would any other liquid soap.  You can apply chg directly to the skin and wash gently with a      scrungie or washcloth.           5.  Apply the CHG Soap to your body ONLY FROM THE NECK DOWN.   Do not use on open wounds or open sores. Avoid contact with your eyes, ears, mouth and genitals (private parts).  Wash genitals (private parts) with your normal soap.  6.  Wash thoroughly, paying special attention to the area where your surgery will be performed.  7.  Thoroughly rinse your body with warm water from the neck down.  8.  DO NOT shower/wash with your normal soap after using and rinsing off the CHG Soap.  9.  Pat yourself dry with a clean towel.            10.  Wear clean pajamas.            11.  Place clean sheets on your bed the night of your first shower and do not sleep with pets.  Day of Surgery  Do not apply any lotions/deoderants the morning of surgery.   Please wear clean clothes to the hospital/surgery center. Remember to brush your teeth with toothpaste.     Please read over the following fact sheets that you were given. Pain Booklet and Surgical Site Infection Prevention

## 2017-09-01 ENCOUNTER — Encounter (HOSPITAL_COMMUNITY): Payer: Self-pay

## 2017-09-01 ENCOUNTER — Encounter (HOSPITAL_COMMUNITY)
Admission: RE | Admit: 2017-09-01 | Discharge: 2017-09-01 | Disposition: A | Discharge status: Home / Self Care | Payer: Medicare Other | Source: Ambulatory Visit | Attending: Neurosurgery | Admitting: Neurosurgery

## 2017-09-01 ENCOUNTER — Other Ambulatory Visit: Payer: Self-pay

## 2017-09-01 DIAGNOSIS — D320 Benign neoplasm of cerebral meninges: Secondary | ICD-10-CM | POA: Diagnosis not present

## 2017-09-01 DIAGNOSIS — Z01812 Encounter for preprocedural laboratory examination: Secondary | ICD-10-CM | POA: Insufficient documentation

## 2017-09-01 DIAGNOSIS — E785 Hyperlipidemia, unspecified: Secondary | ICD-10-CM | POA: Diagnosis not present

## 2017-09-01 DIAGNOSIS — M810 Age-related osteoporosis without current pathological fracture: Secondary | ICD-10-CM | POA: Diagnosis not present

## 2017-09-01 DIAGNOSIS — Z85828 Personal history of other malignant neoplasm of skin: Secondary | ICD-10-CM | POA: Diagnosis not present

## 2017-09-01 DIAGNOSIS — K219 Gastro-esophageal reflux disease without esophagitis: Secondary | ICD-10-CM | POA: Diagnosis not present

## 2017-09-01 DIAGNOSIS — D329 Benign neoplasm of meninges, unspecified: Secondary | ICD-10-CM | POA: Diagnosis not present

## 2017-09-01 DIAGNOSIS — Z91030 Bee allergy status: Secondary | ICD-10-CM | POA: Diagnosis not present

## 2017-09-01 HISTORY — DX: Other specified postprocedural states: R11.2

## 2017-09-01 HISTORY — DX: Malignant (primary) neoplasm, unspecified: C80.1

## 2017-09-01 HISTORY — DX: Other specified postprocedural states: Z98.890

## 2017-09-01 LAB — CBC
HCT: 43 % (ref 36.0–46.0)
Hemoglobin: 14.3 g/dL (ref 12.0–15.0)
MCH: 31.6 pg (ref 26.0–34.0)
MCHC: 33.3 g/dL (ref 30.0–36.0)
MCV: 94.9 fL (ref 78.0–100.0)
Platelets: 258 10*3/uL (ref 150–400)
RBC: 4.53 MIL/uL (ref 3.87–5.11)
RDW: 12.7 % (ref 11.5–15.5)
WBC: 14.2 10*3/uL — ABNORMAL HIGH (ref 4.0–10.5)

## 2017-09-01 LAB — BASIC METABOLIC PANEL
Anion gap: 10 (ref 5–15)
BUN: 25 mg/dL — ABNORMAL HIGH (ref 8–23)
CO2: 23 mmol/L (ref 22–32)
Calcium: 9.1 mg/dL (ref 8.9–10.3)
Chloride: 105 mmol/L (ref 98–111)
Creatinine, Ser: 0.9 mg/dL (ref 0.44–1.00)
GFR calc Af Amer: >60 mL/min (ref >60)
GFR calc non Af Amer: >60 mL/min (ref >60)
Glucose, Bld: 93 mg/dL (ref 70–99)
Potassium: 3.9 mmol/L (ref 3.5–5.1)
Sodium: 138 mmol/L (ref 135–145)

## 2017-09-01 LAB — TYPE AND SCREEN
ABO/RH(D): O NEG
Antibody Screen: NEGATIVE

## 2017-09-01 LAB — ABO/RH: ABO/RH(D): O NEG

## 2017-09-01 NOTE — Pre-Procedure Instructions (Addendum)
Talya Quain  09/01/2017    Your procedure is scheduled on Wednesday, September 07, 2017 at 31 Noon.   Report to [facility] Entrance "A" Admitting Office at 10:00 AM.   Call this number if you have problems the morning of surgery: [phone]   Questions prior to day of surgery, please call [phone] between 8 & 4 PM.   Remember:  Do not eat or drink after midnight Tuesday, 09/06/17.            Take these medicines the morning of surgery with A SIP OF WATER: Dexamethasone (Decadron)  Stop Vitamins as of today prior to surgery. Do not use NSAIDS (Ibuprofen, Aleve, etc) or Aspirin products prior to surgery.    Do not wear jewelry, make-up or nail polish.  Do not wear lotions, powders, perfumes or deodorant.  Do not shave 48 hours prior to surgery.    Do not bring valuables to the hospital.  [facility] is not responsible for any belongings or valuables.  Contacts, dentures or bridgework may not be worn into surgery.  Leave your suitcase in the car.  After surgery it may be brought to your room.  For patients admitted to the hospital, discharge time will be determined by your treatment team.   Special instructions:  [facility] - Preparing for Surgery  Before surgery, you can play an important role.  Because skin is not sterile, your skin needs to be as free of germs as possible.  You can reduce the number of germs on you skin by washing with CHG (chlorahexidine gluconate) soap before surgery.  CHG is an antiseptic cleaner which kills germs and bonds with the skin to continue killing germs even after washing.  Oral Hygiene is also important in reducing the risk of infection.  Remember to brush your teeth with your regular toothpaste the morning of surgery.  Please DO NOT use if you have an allergy to CHG or antibacterial soaps.  If your skin becomes reddened/irritated stop using the CHG and inform your nurse when you arrive at Short Stay.  Do not shave (including legs  and underarms) for at least 48 hours prior to the first CHG shower.  You may shave your face.  Please follow these instructions carefully:   1.  Shower with CHG Soap the night before surgery and the morning of Surgery.  2.  If you choose to wash your hair, wash your hair first as usual with your normal shampoo.  3.  After you shampoo, rinse your hair and body thoroughly to remove the shampoo. 4.  Use CHG as you would any other liquid soap.  You can apply chg directly to the skin and wash gently with a      scrungie or washcloth.           5.  Apply the CHG Soap to your body ONLY FROM THE NECK DOWN.   Do not use on open wounds or open sores. Avoid contact with your eyes, ears, mouth and genitals (private parts).  Wash genitals (private parts) with your normal soap.  6.  Wash thoroughly, paying special attention to the area where your surgery will be performed.  7.  Thoroughly rinse your body with warm water from the neck down.  8.  DO NOT shower/wash with your normal soap after using and rinsing off the CHG Soap.  9.  Pat yourself dry with a clean towel.  10.  Wear clean pajamas.            11.  Place clean sheets on your bed the night of your first shower and do not sleep with pets.  Day of Surgery  Shower as above. Do not apply any lotions/deodorants the morning of surgery.   Please wear clean clothes to the hospital. Remember to brush your teeth with toothpaste.   Please read over the fact sheets that you were given.

## 2017-09-01 NOTE — Progress Notes (Signed)
Pt denies cardiac history, chest pain, HTN or diabetes. Pt states she has memory loss and her hair is falling out.

## 2017-09-04 ENCOUNTER — Other Ambulatory Visit (HOSPITAL_COMMUNITY): Payer: Self-pay | Admitting: Neurosurgery

## 2017-09-04 ENCOUNTER — Encounter (HOSPITAL_COMMUNITY): Payer: Self-pay

## 2017-09-04 ENCOUNTER — Ambulatory Visit (HOSPITAL_COMMUNITY)
Admission: RE | Admit: 2017-09-04 | Discharge: 2017-09-04 | Disposition: A | Discharge status: Home / Self Care | Payer: Medicare Other | Source: Ambulatory Visit | Attending: Neurosurgery | Admitting: Neurosurgery

## 2017-09-04 DIAGNOSIS — D329 Benign neoplasm of meninges, unspecified: Secondary | ICD-10-CM

## 2017-09-04 DIAGNOSIS — D320 Benign neoplasm of cerebral meninges: Secondary | ICD-10-CM

## 2017-09-04 HISTORY — PX: BRAIN SURGERY: SHX531

## 2017-09-04 MED ORDER — GADOBENATE DIMEGLUMINE 529 MG/ML IV SOLN
15.0000 mL | Freq: Once | INTRAVENOUS | Status: AC
  Administered 2017-09-04: 15 mL via INTRAVENOUS

## 2017-09-07 ENCOUNTER — Inpatient Hospital Stay (HOSPITAL_COMMUNITY)
Admission: RE | Admit: 2017-09-07 | Discharge: 2017-09-10 | DRG: 027 | Disposition: A | Discharge status: Home / Self Care | Payer: Medicare Other | Attending: Neurosurgery | Admitting: Neurosurgery

## 2017-09-07 ENCOUNTER — Inpatient Hospital Stay (HOSPITAL_COMMUNITY): Payer: Medicare Other | Admitting: Anesthesiology

## 2017-09-07 ENCOUNTER — Encounter (HOSPITAL_COMMUNITY): Payer: Self-pay

## 2017-09-07 ENCOUNTER — Inpatient Hospital Stay (HOSPITAL_COMMUNITY)
Admission: RE | Disposition: A | Discharge status: Home / Self Care | Payer: Self-pay | Source: Home / Self Care | Attending: Neurosurgery

## 2017-09-07 DIAGNOSIS — K219 Gastro-esophageal reflux disease without esophagitis: Secondary | ICD-10-CM | POA: Diagnosis not present

## 2017-09-07 DIAGNOSIS — M810 Age-related osteoporosis without current pathological fracture: Secondary | ICD-10-CM | POA: Diagnosis not present

## 2017-09-07 DIAGNOSIS — Z85828 Personal history of other malignant neoplasm of skin: Secondary | ICD-10-CM | POA: Diagnosis not present

## 2017-09-07 DIAGNOSIS — D329 Benign neoplasm of meninges, unspecified: Principal | ICD-10-CM | POA: Diagnosis present

## 2017-09-07 DIAGNOSIS — Z91030 Bee allergy status: Secondary | ICD-10-CM

## 2017-09-07 DIAGNOSIS — E785 Hyperlipidemia, unspecified: Secondary | ICD-10-CM | POA: Diagnosis not present

## 2017-09-07 DIAGNOSIS — D320 Benign neoplasm of cerebral meninges: Secondary | ICD-10-CM | POA: Diagnosis not present

## 2017-09-07 HISTORY — PX: APPLICATION OF CRANIAL NAVIGATION: SHX6578

## 2017-09-07 HISTORY — PX: CRANIOTOMY: SHX93

## 2017-09-07 LAB — CBC
HCT: 27.8 % — ABNORMAL LOW (ref 36.0–46.0)
Hemoglobin: 9.2 g/dL — ABNORMAL LOW (ref 12.0–15.0)
MCH: 32.1 pg (ref 26.0–34.0)
MCHC: 33.1 g/dL (ref 30.0–36.0)
MCV: 96.9 fL (ref 78.0–100.0)
Platelets: 151 10*3/uL (ref 150–400)
RBC: 2.87 MIL/uL — ABNORMAL LOW (ref 3.87–5.11)
RDW: 12.9 % (ref 11.5–15.5)
WBC: 22.6 10*3/uL — ABNORMAL HIGH (ref 4.0–10.5)

## 2017-09-07 LAB — BASIC METABOLIC PANEL
Anion gap: 9 (ref 5–15)
BUN: 14 mg/dL (ref 8–23)
CO2: 22 mmol/L (ref 22–32)
Calcium: 7.3 mg/dL — ABNORMAL LOW (ref 8.9–10.3)
Chloride: 107 mmol/L (ref 98–111)
Creatinine, Ser: 0.74 mg/dL (ref 0.44–1.00)
GFR calc Af Amer: >60 mL/min (ref >60)
GFR calc non Af Amer: >60 mL/min (ref >60)
Glucose, Bld: 168 mg/dL — ABNORMAL HIGH (ref 70–99)
Potassium: 3.9 mmol/L (ref 3.5–5.1)
Sodium: 138 mmol/L (ref 135–145)

## 2017-09-07 LAB — MRSA PCR SCREENING: MRSA by PCR: NEGATIVE

## 2017-09-07 SURGERY — CRANIOTOMY TUMOR EXCISION
Anesthesia: General | Site: Head

## 2017-09-07 MED ORDER — ACETAMINOPHEN 10 MG/ML IV SOLN: 1000.0000 mg | Freq: Once | INTRAVENOUS | Status: DC | PRN

## 2017-09-07 MED ORDER — HYDROMORPHONE HCL 1 MG/ML IJ SOLN: 0.2500 mg | INTRAMUSCULAR | Status: DC | PRN

## 2017-09-07 MED ORDER — CEFAZOLIN SODIUM 1 G IJ SOLR
INTRAMUSCULAR | Status: AC
  Filled 2017-09-07: qty 40

## 2017-09-07 MED ORDER — PROPOFOL 10 MG/ML IV BOLUS
INTRAVENOUS | Status: DC | PRN
  Administered 2017-09-07: 150 mg via INTRAVENOUS
  Administered 2017-09-07: 60 mg via INTRAVENOUS

## 2017-09-07 MED ORDER — MIDAZOLAM HCL 2 MG/2ML IJ SOLN
INTRAMUSCULAR | Status: AC
  Filled 2017-09-07: qty 2

## 2017-09-07 MED ORDER — SODIUM CHLORIDE 0.9 % IV SOLN
INTRAVENOUS | Status: DC
  Administered 2017-09-07 (×2): via INTRAVENOUS

## 2017-09-07 MED ORDER — THROMBIN 5000 UNITS EX SOLR
CUTANEOUS | Status: AC
  Filled 2017-09-07: qty 10000

## 2017-09-07 MED ORDER — DEXAMETHASONE SODIUM PHOSPHATE 4 MG/ML IJ SOLN
4.0000 mg | Freq: Three times a day (TID) | INTRAMUSCULAR | Status: DC
  Administered 2017-09-09 – 2017-09-10 (×2): 4 mg via INTRAVENOUS
  Filled 2017-09-07 (×2): qty 1

## 2017-09-07 MED ORDER — SODIUM CHLORIDE 0.9 % IV SOLN
INTRAVENOUS | Status: DC | PRN
  Administered 2017-09-07: 13:00:00 via INTRAVENOUS

## 2017-09-07 MED ORDER — MEPERIDINE HCL 50 MG/ML IJ SOLN: 6.2500 mg | INTRAMUSCULAR | Status: DC | PRN

## 2017-09-07 MED ORDER — DEXAMETHASONE SODIUM PHOSPHATE 10 MG/ML IJ SOLN
INTRAMUSCULAR | Status: DC | PRN
  Administered 2017-09-07: 10 mg via INTRAVENOUS

## 2017-09-07 MED ORDER — LIDOCAINE-EPINEPHRINE 1 %-1:100000 IJ SOLN
INTRAMUSCULAR | Status: AC
  Filled 2017-09-07: qty 1

## 2017-09-07 MED ORDER — DEXAMETHASONE SODIUM PHOSPHATE 4 MG/ML IJ SOLN
6.0000 mg | Freq: Four times a day (QID) | INTRAMUSCULAR | Status: AC
  Administered 2017-09-08 – 2017-09-09 (×4): 6 mg via INTRAVENOUS
  Filled 2017-09-07 (×4): qty 2

## 2017-09-07 MED ORDER — LIDOCAINE 2% (20 MG/ML) 5 ML SYRINGE
INTRAMUSCULAR | Status: DC | PRN
  Administered 2017-09-07: 100 mg via INTRAVENOUS

## 2017-09-07 MED ORDER — ROCURONIUM BROMIDE 10 MG/ML (PF) SYRINGE
PREFILLED_SYRINGE | INTRAVENOUS | Status: DC | PRN
  Administered 2017-09-07: 20 mg via INTRAVENOUS
  Administered 2017-09-07: 50 mg via INTRAVENOUS
  Administered 2017-09-07: 10 mg via INTRAVENOUS
  Administered 2017-09-07: 30 mg via INTRAVENOUS

## 2017-09-07 MED ORDER — CEFAZOLIN SODIUM-DEXTROSE 2-4 GM/100ML-% IV SOLN
2.0000 g | Freq: Three times a day (TID) | INTRAVENOUS | Status: AC
  Administered 2017-09-08 (×2): 2 g via INTRAVENOUS
  Filled 2017-09-07 (×2): qty 100

## 2017-09-07 MED ORDER — MANNITOL 25 % IV SOLN
INTRAVENOUS | Status: DC | PRN
  Administered 2017-09-07: 25 g via INTRAVENOUS

## 2017-09-07 MED ORDER — BISACODYL 5 MG PO TBEC: 5.0000 mg | DELAYED_RELEASE_TABLET | Freq: Every day | ORAL | Status: DC | PRN

## 2017-09-07 MED ORDER — ESMOLOL HCL 100 MG/10ML IV SOLN
INTRAVENOUS | Status: AC
  Filled 2017-09-07: qty 20

## 2017-09-07 MED ORDER — BACITRACIN ZINC 500 UNIT/GM EX OINT
TOPICAL_OINTMENT | CUTANEOUS | Status: AC
  Filled 2017-09-07: qty 28.35

## 2017-09-07 MED ORDER — MIDAZOLAM HCL 2 MG/2ML IJ SOLN
INTRAMUSCULAR | Status: DC | PRN
  Administered 2017-09-07: 1 mg via INTRAVENOUS

## 2017-09-07 MED ORDER — PROPOFOL 10 MG/ML IV BOLUS
INTRAVENOUS | Status: AC
  Filled 2017-09-07: qty 20

## 2017-09-07 MED ORDER — DOCUSATE SODIUM 100 MG PO CAPS
100.0000 mg | ORAL_CAPSULE | Freq: Two times a day (BID) | ORAL | Status: DC
  Administered 2017-09-07 – 2017-09-10 (×4): 100 mg via ORAL
  Filled 2017-09-07 (×5): qty 1

## 2017-09-07 MED ORDER — CEFAZOLIN SODIUM-DEXTROSE 1-4 GM/50ML-% IV SOLN
INTRAVENOUS | Status: DC | PRN
  Administered 2017-09-07 (×2): 2 g via INTRAVENOUS

## 2017-09-07 MED ORDER — BIOTIN W/ VITAMINS C & E 1250-7.5-7.5 MCG-MG-UNT PO CHEW: CHEWABLE_TABLET | Freq: Every day | ORAL | Status: DC

## 2017-09-07 MED ORDER — ONDANSETRON HCL 4 MG PO TABS: 4.0000 mg | ORAL_TABLET | ORAL | Status: DC | PRN

## 2017-09-07 MED ORDER — THROMBIN 20000 UNITS EX KIT
PACK | CUTANEOUS | Status: AC
  Filled 2017-09-07: qty 1

## 2017-09-07 MED ORDER — EPHEDRINE 5 MG/ML INJ
INTRAVENOUS | Status: AC
  Filled 2017-09-07: qty 10

## 2017-09-07 MED ORDER — CALCIUM CARBONATE-VITAMIN D 500-200 MG-UNIT PO TABS
1.0000 | ORAL_TABLET | Freq: Every day | ORAL | Status: DC
  Administered 2017-09-08 – 2017-09-10 (×3): 1 via ORAL
  Filled 2017-09-07 (×5): qty 1

## 2017-09-07 MED ORDER — HYDROMORPHONE HCL 1 MG/ML IJ SOLN: 0.5000 mg | INTRAMUSCULAR | Status: DC | PRN

## 2017-09-07 MED ORDER — LEVETIRACETAM IN NACL 500 MG/100ML IV SOLN
500.0000 mg | Freq: Two times a day (BID) | INTRAVENOUS | Status: DC
  Administered 2017-09-07 – 2017-09-09 (×4): 500 mg via INTRAVENOUS
  Filled 2017-09-07 (×4): qty 100

## 2017-09-07 MED ORDER — SODIUM CHLORIDE 0.9 % IR SOLN
Status: DC | PRN
  Administered 2017-09-07: 1000 mL

## 2017-09-07 MED ORDER — ALBUMIN HUMAN 5 % IV SOLN
INTRAVENOUS | Status: DC | PRN
  Administered 2017-09-07: 15:00:00 via INTRAVENOUS

## 2017-09-07 MED ORDER — SENNOSIDES-DOCUSATE SODIUM 8.6-50 MG PO TABS: 1.0000 | ORAL_TABLET | Freq: Every evening | ORAL | Status: DC | PRN

## 2017-09-07 MED ORDER — LIDOCAINE-EPINEPHRINE 1 %-1:100000 IJ SOLN
INTRAMUSCULAR | Status: DC | PRN
  Administered 2017-09-07: 20 mL

## 2017-09-07 MED ORDER — DEXMEDETOMIDINE HCL 200 MCG/2ML IV SOLN
INTRAVENOUS | Status: DC | PRN
  Administered 2017-09-07: 8 ug via INTRAVENOUS
  Administered 2017-09-07 (×2): 4 ug via INTRAVENOUS
  Administered 2017-09-07: 8 ug via INTRAVENOUS
  Administered 2017-09-07: 4 ug via INTRAVENOUS

## 2017-09-07 MED ORDER — PHENYLEPHRINE 40 MCG/ML (10ML) SYRINGE FOR IV PUSH (FOR BLOOD PRESSURE SUPPORT)
PREFILLED_SYRINGE | INTRAVENOUS | Status: AC
  Filled 2017-09-07: qty 10

## 2017-09-07 MED ORDER — FENTANYL CITRATE (PF) 250 MCG/5ML IJ SOLN
INTRAMUSCULAR | Status: DC | PRN
  Administered 2017-09-07: 50 ug via INTRAVENOUS
  Administered 2017-09-07: 100 ug via INTRAVENOUS
  Administered 2017-09-07 (×5): 50 ug via INTRAVENOUS

## 2017-09-07 MED ORDER — THROMBIN 5000 UNITS EX SOLR
OROMUCOSAL | Status: DC | PRN
  Administered 2017-09-07: 5 mL via TOPICAL

## 2017-09-07 MED ORDER — HYDROCODONE-ACETAMINOPHEN 5-325 MG PO TABS
1.0000 | ORAL_TABLET | ORAL | Status: DC | PRN
  Administered 2017-09-07 – 2017-09-08 (×2): 1 via ORAL
  Filled 2017-09-07 (×2): qty 1

## 2017-09-07 MED ORDER — THROMBIN 20000 UNITS EX SOLR
CUTANEOUS | Status: DC | PRN
  Administered 2017-09-07 (×2): 20 mL via TOPICAL

## 2017-09-07 MED ORDER — FENTANYL CITRATE (PF) 250 MCG/5ML IJ SOLN
INTRAMUSCULAR | Status: AC
  Filled 2017-09-07: qty 5

## 2017-09-07 MED ORDER — MICROFIBRILLAR COLL HEMOSTAT EX POWD
CUTANEOUS | Status: AC
  Filled 2017-09-07: qty 5

## 2017-09-07 MED ORDER — SODIUM CHLORIDE 0.9 % IV SOLN
INTRAVENOUS | Status: DC | PRN
  Administered 2017-09-07: 500 mL

## 2017-09-07 MED ORDER — LIDOCAINE 2% (20 MG/ML) 5 ML SYRINGE
INTRAMUSCULAR | Status: AC
  Filled 2017-09-07: qty 5

## 2017-09-07 MED ORDER — FINASTERIDE 5 MG PO TABS
2.5000 mg | ORAL_TABLET | Freq: Every day | ORAL | Status: DC
  Administered 2017-09-08: 2.5 mg via ORAL
  Filled 2017-09-07 (×4): qty 0.5

## 2017-09-07 MED ORDER — BACITRACIN ZINC 500 UNIT/GM EX OINT
TOPICAL_OINTMENT | CUTANEOUS | Status: DC | PRN
  Administered 2017-09-07: 1 via TOPICAL

## 2017-09-07 MED ORDER — DEXAMETHASONE SODIUM PHOSPHATE 10 MG/ML IJ SOLN
INTRAMUSCULAR | Status: AC
  Filled 2017-09-07: qty 1

## 2017-09-07 MED ORDER — DEXAMETHASONE SODIUM PHOSPHATE 10 MG/ML IJ SOLN
10.0000 mg | Freq: Four times a day (QID) | INTRAMUSCULAR | Status: AC
  Administered 2017-09-07 – 2017-09-08 (×4): 10 mg via INTRAVENOUS
  Filled 2017-09-07 (×4): qty 1

## 2017-09-07 MED ORDER — DEXAMETHASONE 4 MG PO TABS: 4.0000 mg | ORAL_TABLET | Freq: Four times a day (QID) | ORAL | Status: DC

## 2017-09-07 MED ORDER — SODIUM CHLORIDE 0.9 % IV SOLN
INTRAVENOUS | Status: DC | PRN
  Administered 2017-09-07: 10 ug/min via INTRAVENOUS

## 2017-09-07 MED ORDER — ONDANSETRON HCL 4 MG/2ML IJ SOLN
INTRAMUSCULAR | Status: AC
  Filled 2017-09-07: qty 2

## 2017-09-07 MED ORDER — EZETIMIBE 10 MG PO TABS
10.0000 mg | ORAL_TABLET | Freq: Every day | ORAL | Status: DC
  Administered 2017-09-08 – 2017-09-10 (×3): 10 mg via ORAL
  Filled 2017-09-07 (×3): qty 1

## 2017-09-07 MED ORDER — HEMOSTATIC AGENTS (NO CHARGE) OPTIME
TOPICAL | Status: DC | PRN
  Administered 2017-09-07: 1
  Administered 2017-09-07: 1 via TOPICAL

## 2017-09-07 MED ORDER — PROMETHAZINE HCL 25 MG/ML IJ SOLN: 6.2500 mg | INTRAMUSCULAR | Status: DC | PRN

## 2017-09-07 MED ORDER — POTASSIUM CHLORIDE IN NACL 20-0.9 MEQ/L-% IV SOLN
INTRAVENOUS | Status: DC
  Administered 2017-09-07 – 2017-09-10 (×4): via INTRAVENOUS
  Filled 2017-09-07 (×5): qty 1000

## 2017-09-07 MED ORDER — HYDROCODONE-ACETAMINOPHEN 7.5-325 MG PO TABS: 1.0000 | ORAL_TABLET | Freq: Once | ORAL | Status: DC | PRN

## 2017-09-07 MED ORDER — ONDANSETRON HCL 4 MG/2ML IJ SOLN: 4.0000 mg | INTRAMUSCULAR | Status: DC | PRN

## 2017-09-07 MED ORDER — SODIUM CHLORIDE 0.9 % IR SOLN
Status: DC | PRN
  Administered 2017-09-07: 3000 mL

## 2017-09-07 MED ORDER — CEFAZOLIN SODIUM-DEXTROSE 2-4 GM/100ML-% IV SOLN
INTRAVENOUS | Status: AC
  Filled 2017-09-07: qty 100

## 2017-09-07 MED ORDER — ONDANSETRON HCL 4 MG/2ML IJ SOLN
INTRAMUSCULAR | Status: DC | PRN
  Administered 2017-09-07: 4 mg via INTRAVENOUS

## 2017-09-07 MED ORDER — LABETALOL HCL 5 MG/ML IV SOLN: 10.0000 mg | INTRAVENOUS | Status: DC | PRN

## 2017-09-07 MED ORDER — LEVETIRACETAM IN NACL 500 MG/100ML IV SOLN
500.0000 mg | INTRAVENOUS | Status: AC
  Administered 2017-09-07: 500 mg via INTRAVENOUS
  Filled 2017-09-07: qty 100

## 2017-09-07 MED ORDER — THROMBIN 20000 UNITS EX SOLR: CUTANEOUS | Status: DC | PRN

## 2017-09-07 MED ORDER — PROMETHAZINE HCL 25 MG PO TABS: 12.5000 mg | ORAL_TABLET | ORAL | Status: DC | PRN

## 2017-09-07 MED ORDER — PANTOPRAZOLE SODIUM 40 MG PO TBEC
40.0000 mg | DELAYED_RELEASE_TABLET | Freq: Every day | ORAL | Status: DC
  Administered 2017-09-07 – 2017-09-10 (×4): 40 mg via ORAL
  Filled 2017-09-07 (×4): qty 1

## 2017-09-07 SURGICAL SUPPLY — 77 items
ADH SKN CLS APL DERMABOND .7 (GAUZE/BANDAGES/DRESSINGS) ×1
BAG DECANTER FOR FLEXI CONT (MISCELLANEOUS) ×2 IMPLANT
BANDAGE GAUZE 4  KLING STR (GAUZE/BANDAGES/DRESSINGS) IMPLANT
BLADE CLIPPER SURG (BLADE) ×2 IMPLANT
BLADE SURG 11 STRL SS (BLADE) ×2 IMPLANT
BNDG CMPR 75X41 PLY ABS (GAUZE/BANDAGES/DRESSINGS) ×1
BNDG COHESIVE 4X5 TAN NS LF (GAUZE/BANDAGES/DRESSINGS) IMPLANT
BNDG GAUZE ELAST 4 BULKY (GAUZE/BANDAGES/DRESSINGS) ×2 IMPLANT
BNDG STRETCH 4X75 NS LF (GAUZE/BANDAGES/DRESSINGS) ×2 IMPLANT
BUR ACORN 9.0 PRECISION (BURR) ×2 IMPLANT
BUR SPIRAL ROUTER 2.3 (BUR) IMPLANT
CANISTER SUCT 3000ML PPV (MISCELLANEOUS) ×6 IMPLANT
CARTRIDGE OIL MAESTRO DRILL (MISCELLANEOUS) ×1 IMPLANT
CONT SPEC 4OZ CLIKSEAL STRL BL (MISCELLANEOUS) ×6 IMPLANT
DECANTER SPIKE VIAL GLASS SM (MISCELLANEOUS) ×2 IMPLANT
DERMABOND ADVANCED (GAUZE/BANDAGES/DRESSINGS) ×1
DERMABOND ADVANCED .7 DNX12 (GAUZE/BANDAGES/DRESSINGS) ×1 IMPLANT
DIFFUSER DRILL AIR PNEUMATIC (MISCELLANEOUS) ×4 IMPLANT
DRAPE MICROSCOPE LEICA (MISCELLANEOUS) ×2 IMPLANT
DRAPE WARM FLUID 44X44 (DRAPE) ×2 IMPLANT
DRSG OPSITE 4X5.5 SM (GAUZE/BANDAGES/DRESSINGS) ×6 IMPLANT
DURAMATRIX ONLAY 3X3 (Plate) ×2 IMPLANT
ELECT CAUTERY BLADE 6.4 (BLADE) IMPLANT
ELECT REM PT RETURN 9FT ADLT (ELECTROSURGICAL) ×2
ELECTRODE REM PT RTRN 9FT ADLT (ELECTROSURGICAL) ×1 IMPLANT
FORCEPS BIPOLAR SPETZLER 8 1.0 (NEUROSURGERY SUPPLIES) ×2 IMPLANT
GAUZE 4X4 16PLY RFD (DISPOSABLE) IMPLANT
GAUZE SPONGE 4X4 12PLY STRL (GAUZE/BANDAGES/DRESSINGS) ×2 IMPLANT
GLOVE BIO SURGEON STRL SZ7 (GLOVE) ×2 IMPLANT
GLOVE BIO SURGEON STRL SZ8 (GLOVE) ×4 IMPLANT
GLOVE BIOGEL PI IND STRL 7.0 (GLOVE) ×3 IMPLANT
GLOVE BIOGEL PI IND STRL 7.5 (GLOVE) ×1 IMPLANT
GLOVE BIOGEL PI INDICATOR 7.0 (GLOVE) ×3
GLOVE BIOGEL PI INDICATOR 7.5 (GLOVE) ×1
GLOVE ECLIPSE 6.5 STRL STRAW (GLOVE) ×2 IMPLANT
GLOVE INDICATOR 8.5 STRL (GLOVE) ×4 IMPLANT
GOWN STRL NON-REIN LRG LVL3 (GOWN DISPOSABLE) ×2 IMPLANT
GOWN STRL REUS W/ TWL LRG LVL3 (GOWN DISPOSABLE) ×1 IMPLANT
GOWN STRL REUS W/ TWL XL LVL3 (GOWN DISPOSABLE) ×1 IMPLANT
GOWN STRL REUS W/TWL 2XL LVL3 (GOWN DISPOSABLE) ×2 IMPLANT
GOWN STRL REUS W/TWL LRG LVL3 (GOWN DISPOSABLE) ×2
GOWN STRL REUS W/TWL XL LVL3 (GOWN DISPOSABLE) ×2
HEMOSTAT SURGICEL 2X14 (HEMOSTASIS) IMPLANT
KIT BASIN OR (CUSTOM PROCEDURE TRAY) ×2 IMPLANT
KIT TURNOVER KIT B (KITS) ×2 IMPLANT
MARKER SPHERE PSV REFLC 13MM (MARKER) ×4 IMPLANT
NEEDLE HYPO 25X1 1.5 SAFETY (NEEDLE) ×2 IMPLANT
NS IRRIG 1000ML POUR BTL (IV SOLUTION) ×4 IMPLANT
OIL CARTRIDGE MAESTRO DRILL (MISCELLANEOUS) ×2
PACK CRANIOTOMY CUSTOM (CUSTOM PROCEDURE TRAY) ×2 IMPLANT
PAD ARMBOARD 7.5X6 YLW CONV (MISCELLANEOUS) ×6 IMPLANT
PATTIES SURGICAL .25X.25 (GAUZE/BANDAGES/DRESSINGS) IMPLANT
PATTIES SURGICAL .5 X.5 (GAUZE/BANDAGES/DRESSINGS) ×4 IMPLANT
PATTIES SURGICAL .5 X3 (DISPOSABLE) ×8 IMPLANT
PATTIES SURGICAL 1X1 (DISPOSABLE) ×2 IMPLANT
PLATE 1.5/0.5 22MM BURR HO (Plate) ×2 IMPLANT
PLATE 1.5/0.5 25MM BURR HOLE (Plate) ×6 IMPLANT
RUBBERBAND STERILE (MISCELLANEOUS) ×4 IMPLANT
SCREW SELF DRILL HT 1.5/4MM (Screw) ×40 IMPLANT
SET CARTRIDGE AND TUBING (SET/KITS/TRAYS/PACK) ×2 IMPLANT
SPONGE NEURO XRAY DETECT 1X3 (DISPOSABLE) ×4 IMPLANT
SPONGE SURGIFOAM ABS GEL 100 (HEMOSTASIS) ×6 IMPLANT
STAPLER VISISTAT 35W (STAPLE) ×4 IMPLANT
SUT ETHILON 3 0 FSL (SUTURE) ×4 IMPLANT
SUT NURALON 4 0 TR CR/8 (SUTURE) ×6 IMPLANT
SUT SILK 0 FSL (SUTURE) ×18 IMPLANT
SUT VIC AB 2-0 CT1 18 (SUTURE) ×8 IMPLANT
SYR CONTROL 10ML LL (SYRINGE) ×2 IMPLANT
TIP STANDARD 36KHZ (INSTRUMENTS) ×2
TIP STD 36KHZ (INSTRUMENTS) ×1 IMPLANT
TOWEL GREEN STERILE (TOWEL DISPOSABLE) ×2 IMPLANT
TOWEL GREEN STERILE FF (TOWEL DISPOSABLE) IMPLANT
TRAY FOLEY MTR SLVR 16FR STAT (SET/KITS/TRAYS/PACK) ×2 IMPLANT
TUBE CONNECTING 12X1/4 (SUCTIONS) ×4 IMPLANT
UNDERPAD 30X30 (UNDERPADS AND DIAPERS) ×2 IMPLANT
WATER STERILE IRR 1000ML POUR (IV SOLUTION) ×2 IMPLANT
WRENCH TORQUE 36KHZ (INSTRUMENTS) ×2 IMPLANT

## 2017-09-07 NOTE — Anesthesia Preprocedure Evaluation (Addendum)
Anesthesia Evaluation  Patient identified by MRN, date of birth, ID band Patient awake    Reviewed: Allergy & Precautions, NPO status , Patient's Chart, lab work & pertinent test results  Airway Mallampati: II  TM Distance: >3 FB Neck ROM: Full    Dental no notable dental hx. (+) Teeth Intact, Dental Advisory Given   Pulmonary neg pulmonary ROS,    Pulmonary exam normal breath sounds clear to auscultation       Cardiovascular negative cardio ROS Normal cardiovascular exam Rhythm:Regular Rate:Normal  10/19/16 Stress test  Blood pressure demonstrated a hypertensive response to exercise.  There was no ST segment deviation noted during stress.   1. Hypertensive BP response.  2. Below average exercise capacity.  3. No evidence for ischemia by ST segment analysis.     Neuro/Psych  Neuromuscular disease negative psych ROS   GI/Hepatic negative GI ROS, Neg liver ROS, GERD  ,  Endo/Other  negative endocrine ROS  Renal/GU negative Renal ROS     Musculoskeletal   Abdominal   Peds  Hematology  (+) anemia ,   Anesthesia Other Findings   Reproductive/Obstetrics                            Anesthesia Physical Anesthesia Plan  ASA: II  Anesthesia Plan: General   Post-op Pain Management:    Induction: Intravenous  PONV Risk Score and Plan: 3 and Treatment may vary due to age or medical condition, Ondansetron and Dexamethasone  Airway Management Planned: Oral ETT  Additional Equipment: Arterial line  Intra-op Plan:   Post-operative Plan: Extubation in OR and Possible Post-op intubation/ventilation  Informed Consent: I have reviewed the patients History and Physical, chart, labs and discussed the procedure including the risks, benefits and alternatives for the proposed anesthesia with the patient or authorized representative who has indicated his/her understanding and acceptance.   Dental  advisory given  Plan Discussed with:   Anesthesia Plan Comments:         Anesthesia Quick Evaluation

## 2017-09-07 NOTE — Anesthesia Postprocedure Evaluation (Signed)
Anesthesia Post Note  Patient: [name] [name]  Procedure(s) Performed: CRAINIOTOMY BI CORONAL w/BRAINLAB (N/A Head) APPLICATION OF CRANIAL NAVIGATION (N/A Head)     Patient location during evaluation: PACU Anesthesia Type: General Level of consciousness: awake and alert Pain management: pain level controlled Vital Signs Assessment: post-procedure vital signs reviewed and stable Respiratory status: spontaneous breathing, nonlabored ventilation, respiratory function stable and patient connected to nasal cannula oxygen Cardiovascular status: blood pressure returned to baseline and stable Postop Assessment: no apparent nausea or vomiting Anesthetic complications: no    Last Vitals:  Vitals:   09/07/17 2030 09/07/17 2045  BP:    Pulse: 62 (!) 59  Resp: 16 15  Temp:    SpO2: 96% 96%    Last Pain:  Vitals:   09/07/17 2000  TempSrc: Oral  PainSc:                  Pamala Hayman COKER

## 2017-09-07 NOTE — Anesthesia Procedure Notes (Signed)
Arterial Line Insertion Start/End9/04/2017 11:45 AM, 09/07/2017 12:04 PM Performed by: Myna Bright, CRNA, CRNA  Patient location: Pre-op. Preanesthetic checklist: patient identified, IV checked, site marked, risks and benefits discussed, surgical consent, monitors and equipment checked and pre-op evaluation Lidocaine 1% used for infiltration radial was placed Catheter size: 20 G Hand hygiene performed  and maximum sterile barriers used  Allen's test indicative of satisfactory collateral circulation Attempts: 1 Procedure performed without using ultrasound guided technique. Following insertion, Biopatch and dressing applied. Post procedure assessment: normal  Patient tolerated the procedure well with no immediate complications.

## 2017-09-07 NOTE — Op Note (Signed)
Preoperative diagnosis: Left frontal parasagittal meningioma  Postoperative diagnosis: Same  Procedure: Stereotactic bicoronal craniotomy for resection of left frontal parasagittal meningioma utilizing the BrainLab stereotactic navigation system with dividing and removing the anterior third of the superior sagittal sinus and duraplasty with dura matrix.  Surgeon: Kary Kos  Assistant: Ashok Pall  Anesthesia: General  EBL: Minimal  HPI: 72 year old female who presented with concentration memory difficulties and confusion work-up revealed a large 6 cm parasagittal meningioma eccentric to the left with some invasion of the falx complete occlusion of the superior sagittal sinus and some displacement over to the right frontal lobe.  Due to patient's imaging findings progression of clinical syndrome I recommended stereotactic craniotomy for resection of parasagittal meningioma.  I extensively went over the risks and benefits of the operation with her as well as perioperative course expectations of outcome and alternatives of surgery and she understood and agreed to proceed forward.  Operative procedure: Patient brought into the ER was induced and general anesthesia positioned supine the back slightly flexed the head neck slightly flexed in neutral position locked and pans and after registration of the BrainLab stereotactic navigation system, the head was shaved prepped and draped in routine sterile fashion.  Utilizing the stereotactic navigation system I mapped out  a bicoronal incision positioned just behind the tumor and frontally anterior to the tumor.  Then performed a bicoronal craniotomy with bur holes just about 2 cm right of midline to expose some of the right frontal lobe to allow me access to the superior sagittal sinus and the falx.  Then one bur hole was on the superotemporal line in the left side I turned the flap on the anterior supraorbital ridge and remove the bone after freeing it up  from the superior sagittal sinus.  Then opened up the dura on the left side flapping it medially retracted all the way back to the sinus and the falx bilaterally.  The dura was opened up on the right side to allow visualization.  The tumor was immediately visualized the tumor had eroded through the dura superior through the superior sagittal sinus and was in contact with a craniotomy flap with there was a little bit of dural or bony erosion from the tumor but no overt evidence of invasion.  I placed the flap over on the table and then started developing the plane with microdissection techniques utilizing an arachnoid plane keeping the P of the brain intact as much secured in the frontal lobes and developed a plane along the tumor border.  Since significant amount of necrotic tumor tendon in the specimen and tract the frontal margin of the tumor all the way to the falx dissected out the anterior margin of the superior sagittal sinus and the falx bilaterally and tied off the anterior part of the suprasellar sinus with 0 silks.  Then divided the anterior part of the superior sagittal sinus and worked in a 306 degree orientation developing the plane around the meningioma.  I was able to develop a peel plane along the left frontal lobe all the way up and underneath the tumor.  The superficial parotid tumor was very soft and suckable the deep part was very hard and partially calcified.  I was not able to utilize the ultrasonic aspirator to any degree.  I did utilize loop Bovie at times to debulk the center of the tumor.  Then looking in the right frontal lobe identified the knuckle that extended through the falx into the right frontal lobe  and dissected this off the right frontal lobe carefully utilizing microdissection techniques to keep the P of the right frontal lobe intact.  At this point after the calcified and large block of the intraparotid tumor was freed up laterally I then tracked it all up underneath the falx  and cut the falx along with tumor bulk and remove it en bloc.  Identified distal intracerebral arteries in the inferior aspect the tumor kept those intact and dissected off the inferior tumor during resection.  At the end of the resection I did not visualize any residual tumor frontal lateral or posterior there may have been a little bit of residual tumor in the superior sagittal sinus on the posterior aspect of where I divided it.  Meticulous hemostasis was maintained I then selected a 7 x 7 cm DuraMatrix patch and utilizing dural tack ups and dural tack up holes reapproximated and sewed the patch of dura read re-creating a barrier.  The entire dura around the tumor was resected and sent off for pathology.  Then the bone flap where the tumor had come in contact and eroded scraped all any residual tumor of the undersurface of the flap and aggressively coagulated the undersurface of it then replaced the bone flap with bur hole covers and reapproximated scalp with interrupted Vicryls and a running nylon.  At the end the case all needle counts sponge counts were correct.

## 2017-09-07 NOTE — Anesthesia Procedure Notes (Signed)
Procedure Name: Intubation Date/Time: 09/07/2017 12:47 PM Performed by: Clearnce Sorrel, CRNA Pre-anesthesia Checklist: Patient identified, Emergency Drugs available, Suction available and Patient being monitored Patient Re-evaluated:Patient Re-evaluated prior to induction Oxygen Delivery Method: Circle System Utilized Preoxygenation: Pre-oxygenation with 100% oxygen Induction Type: IV induction Ventilation: Mask ventilation without difficulty Laryngoscope Size: Mac and 3 Grade View: Grade I Tube type: Oral Tube size: 7.0 mm Number of attempts: 1 Airway Equipment and Method: Stylet Placement Confirmation: ETT inserted through vocal cords under direct vision,  positive ETCO2 and breath sounds checked- equal and bilateral Secured at: 23 cm Tube secured with: Tape Dental Injury: Teeth and Oropharynx as per pre-operative assessment  Comments: Intubated by [facility]

## 2017-09-07 NOTE — Transfer of Care (Signed)
Immediate Anesthesia Transfer of Care Note  Patient: [name] [name]  Procedure(s) Performed: CRAINIOTOMY BI CORONAL w/BRAINLAB (N/A Head) APPLICATION OF CRANIAL NAVIGATION (N/A Head)  Patient Location: PACU  Anesthesia Type:General  Level of Consciousness: awake and patient cooperative  Airway & Oxygen Therapy: Patient Spontanous Breathing and Patient connected to face mask oxygen  Post-op Assessment: Report given to RN and Post -op Vital signs reviewed and stable  Post vital signs: Reviewed and stable  Last Vitals:  Vitals Value Taken Time  BP 117/70 09/07/2017  6:37 PM  Temp    Pulse 66 09/07/2017  6:38 PM  Resp 13 09/07/2017  6:38 PM  SpO2 100 % 09/07/2017  6:38 PM  Vitals shown include unvalidated device data.  Last Pain:  Vitals:   09/07/17 1036  PainSc: 0-No pain      Patients Stated Pain Goal: 2 (35/68/61 6837)  Complications: No apparent anesthesia complications

## 2017-09-07 NOTE — H&P (Signed)
[name] [name] is an 72 y.o. female.   Chief Complaint: Parasagittal meningioma HPI: 72 year old female presented the emergency room upon direction from her neurologist after a outpatient MRI scan to work-up memory loss and confusion showed a large left parasagittal meningioma.  Patient was worked up extensively confirmed to be meningioma placed on IV steroids was discharged and brought back for elective resection.  Currently the patient denies any headaches nausea vomiting numbness tingling arms or legs.  Past Medical History:  Diagnosis Date  . Cancer ([place])    skin cancer   . Hyperlipidemia   . Osteoporosis   . PONV (postoperative nausea and vomiting)    with breast surgery    Past Surgical History:  Procedure Laterality Date  . BREAST ENHANCEMENT SURGERY    . BUNIONECTOMY    . COLONOSCOPY    . EYE SURGERY Bilateral    cataract surgery  . SKIN CANCER EXCISION    . WISDOM TOOTH EXTRACTION      Family History  Problem Relation Age of Onset  . Breast cancer Mother   . Heart disease Father    Social History:  reports that she has never smoked. She has never used smokeless tobacco. She reports that she does not drink alcohol or use drugs.  Allergies:  Allergies  Allergen Reactions  . Bee Venom Swelling    Medications Prior to Admission  Medication Sig Dispense Refill  . Biotin w/ Vitamins C & E (HAIR SKIN & NAILS GUMMIES PO) Take 1 tablet by mouth daily.    . Calcium Carbonate-Vitamin D (CALCIUM 600/VITAMIN D) 600-400 MG-UNIT chew tablet Chew 1 tablet by mouth daily.    Marland Kitchen dexamethasone (DECADRON) 4 MG tablet Take 4 mg by mouth 4 (four) times daily.    Marland Kitchen ezetimibe (ZETIA) 10 MG tablet Take 1 tablet (10 mg total) by mouth daily. 90 tablet 3  . finasteride (PROPECIA) 1 MG tablet Take 1 mg by mouth daily.   11    No results found for this or any previous visit (from the past 48 hour(s)). No results found.  Review of Systems  Neurological: Positive for headaches.     Blood pressure (!) 169/88, pulse 60, temperature 98.6 F (37 C), resp. rate 18, height 5\' 3"  (1.6 m), weight 70.3 kg, SpO2 98 %. Physical Exam  Constitutional: She is oriented to person, place, and time. She appears well-developed and well-nourished.  HENT:  Head: Normocephalic.  Eyes: Pupils are equal, round, and reactive to light.  Neck: Normal range of motion.  Respiratory: Effort normal.  GI: Soft. Bowel sounds are normal.  Neurological: She is alert and oriented to person, place, and time. She has normal strength. GCS eye subscore is 4. GCS verbal subscore is 5. GCS motor subscore is 6.  Patient is awake alert oriented pupils are equal and reactive intact strength is 5 out of 5 upper and lower extremities with no pronator drift cranial nerves are intact  Skin: Skin is warm and dry.     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  Due to patient's progression of clinical syndrome imaging findings I recommended bicoronal craniotomy for resection of parasagittal hemangioma.  I extensively gone over the risks and benefits of stereotactic craniotomy with her as well as perioperative course expectations of outcome and alternatives to surgery and she understands and agrees to proceed forward.  Alekhya Gravlin P, MD 09/07/2017, 11:43 AM

## 2017-09-08 ENCOUNTER — Encounter (HOSPITAL_COMMUNITY): Payer: Self-pay | Admitting: Neurosurgery

## 2017-09-08 ENCOUNTER — Inpatient Hospital Stay (HOSPITAL_COMMUNITY): Payer: Medicare Other

## 2017-09-08 ENCOUNTER — Other Ambulatory Visit: Payer: Self-pay

## 2017-09-08 LAB — BASIC METABOLIC PANEL
Anion gap: 9 (ref 5–15)
BUN: 15 mg/dL (ref 8–23)
CO2: 22 mmol/L (ref 22–32)
Calcium: 7.7 mg/dL — ABNORMAL LOW (ref 8.9–10.3)
Chloride: 105 mmol/L (ref 98–111)
Creatinine, Ser: 0.75 mg/dL (ref 0.44–1.00)
GFR calc Af Amer: >60 mL/min (ref >60)
GFR calc non Af Amer: >60 mL/min (ref >60)
Glucose, Bld: 137 mg/dL — ABNORMAL HIGH (ref 70–99)
Potassium: 4.4 mmol/L (ref 3.5–5.1)
Sodium: 136 mmol/L (ref 135–145)

## 2017-09-08 MED ORDER — GADOBENATE DIMEGLUMINE 529 MG/ML IV SOLN
15.0000 mL | Freq: Once | INTRAVENOUS | Status: AC
  Administered 2017-09-08: 14 mL via INTRAVENOUS

## 2017-09-08 MED FILL — Thrombin For Soln Kit 20000 Unit: CUTANEOUS | Qty: 1 | Status: AC

## 2017-09-08 MED FILL — Gelatin Absorbable Sponge Size 100: CUTANEOUS | Qty: 1 | Status: AC

## 2017-09-08 NOTE — Progress Notes (Signed)
Subjective: Patient reports Patient doing very well no headache no new numbness or tingling strength 5 out of 5  Objective: Vital signs in last 24 hours: Temp:  [97.3 F (36.3 C)-98.6 F (37 C)] 97.9 F (36.6 C) (09/05 0400) Pulse Rate:  [57-81] 71 (09/05 0700) Resp:  [8-23] 15 (09/05 0700) BP: (106-169)/(60-88) 108/60 (09/05 0700) SpO2:  [94 %-100 %] 94 % (09/05 0700) Arterial Line BP: (136-138)/(68-71) 137/68 (09/04 1925) Weight:  [70.3 kg-70.7 kg] 70.7 kg (09/04 1949)  Intake/Output from previous day: 09/04 0701 - 09/05 0700 In: 2142 [I.V.:1437.6; IV Piggyback:704.4] Out: 2400 [Urine:2050; Blood:350] Intake/Output this shift: No intake/output data recorded.  Awake alert oriented strength 5 out of 5 wound clean dry and intact  Lab Results: Recent Labs    09/07/17 2032  WBC 22.6*  HGB 9.2*  HCT 27.8*  PLT 151   BMET Recent Labs    09/07/17 2032 09/08/17 0606  NA 138 136  K 3.9 4.4  CL 107 105  CO2 22 22  GLUCOSE 168* 137*  BUN 14 15  CREATININE 0.74 0.75  CALCIUM 7.3* 7.7*    Studies/Results: No results found.  Assessment/Plan: Postop day 1 craniotomy for resection of parasagittal meningioma.  Patient doing very well scheduled for MRI scan today physical and Occupational Therapy continue observation in the ICU.  LOS: 1 day     Halina Asano P 09/08/2017, 8:30 AM

## 2017-09-08 NOTE — Evaluation (Signed)
Physical Therapy Evaluation Patient Details Name: [name] [name] MRN: [id number] DOB: [birth year] Today's Date: 09/08/2017   History of Present Illness  Pt is a 72 y/o female s/p L frontal sagittal meningioma resection and craniotomy. PMH includes skin cancer and osteoporosis.   Clinical Impression  Pt is s/p surgery above with deficits below. Pt presenting with cognitive deficits, and mild unsteadiness. Required min to min guard A for mobility this session. VSS throughout. Anticipate pt will progress well and be able to d/c home with HHPT. Educated pt about need for assist at home and pt's fiance reports he can stay with her. Will continue to follow acutely to maximize functional mobility independence and safety.     Follow Up Recommendations Home health PT;Supervision/Assistance - 24 hour    Equipment Recommendations  Other (comment)(TBD )    Recommendations for Other Services OT consult     Precautions / Restrictions Precautions Precautions: Fall Restrictions Weight Bearing Restrictions: No      Mobility  Bed Mobility Overal bed mobility: Needs Assistance Bed Mobility: Supine to Sit     Supine to sit: Supervision     General bed mobility comments: Supervision for safety. Some dizziness reported, however, resolved with seated rest break.   Transfers Overall transfer level: Needs assistance Equipment used: None Transfers: Sit to/from Stand Sit to Stand: Min guard         General transfer comment: Min guard for safety.   Ambulation/Gait Ambulation/Gait assistance: Min assist;Min guard Gait Distance (Feet): 150 Feet Assistive device: None Gait Pattern/deviations: Step-through pattern;Decreased stride length Gait velocity: Decreased    General Gait Details: Slow, mildly unsteady gait, requiring min to min guard A. VSS throughout   Stairs            Wheelchair Mobility    Modified Rankin (Stroke Patients Only)       Balance Overall balance  assessment: Needs assistance Sitting-balance support: No upper extremity supported;Feet supported Sitting balance-Leahy Scale: Good     Standing balance support: No upper extremity supported;During functional activity Standing balance-Leahy Scale: Fair                               Pertinent Vitals/Pain Pain Assessment: No/denies pain    Home Living Family/patient expects to be discharged to:: Private residence Living Arrangements: Alone Available Help at Discharge: Family;Available 24 hours/day(fiance going to stay with pt ) Type of Home: House Home Access: Stairs to enter Entrance Stairs-Rails: None Entrance Stairs-Number of Steps: 4 Home Layout: Two level Home Equipment: None      Prior Function Level of Independence: Independent               Hand Dominance        Extremity/Trunk Assessment   Upper Extremity Assessment Upper Extremity Assessment: Defer to OT evaluation    Lower Extremity Assessment Lower Extremity Assessment: RLE deficits/detail;LLE deficits/detail RLE Deficits / Details: Grossly 4+/5 throughout.  RLE Sensation: WNL LLE Deficits / Details: Grossly 4+/5 throughout.  LLE Sensation: WNL    Cervical / Trunk Assessment Cervical / Trunk Assessment: Normal  Communication   Communication: No difficulties  Cognition Arousal/Alertness: Awake/alert Behavior During Therapy: WFL for tasks assessed/performed Overall Cognitive Status: Impaired/Different from baseline Area of Impairment: Memory;Problem solving                     Memory: Decreased recall of precautions;Decreased short-term memory       Problem Solving:  Slow processing;Requires verbal cues General Comments: Pt reports short term memory deficits and would often change subject during conversation to unrelated topic.       General Comments General comments (skin integrity, edema, etc.): Pt's fiance present during session. Educated pt about importance for need  for assist at d/c, however, pt insisting she will be fine without assist. Educated pt's fiance as well about need for assist.     Exercises     Assessment/Plan    PT Assessment Patient needs continued PT services  PT Problem List Decreased safety awareness;Decreased knowledge of use of DME;Decreased knowledge of precautions;Decreased mobility;Decreased balance;Decreased activity tolerance;Decreased range of motion;Decreased strength       PT Treatment Interventions DME instruction;Functional mobility training;Stair training;Gait training;Therapeutic activities;Therapeutic exercise;Balance training;Patient/family education;Cognitive remediation    PT Goals (Current goals can be found in the Care Plan section)  Acute Rehab PT Goals Patient Stated Goal: to go home  PT Goal Formulation: With patient Time For Goal Achievement: 09/22/17 Potential to Achieve Goals: Good    Frequency Min 4X/week   Barriers to discharge        Co-evaluation               AM-PAC PT "6 Clicks" Daily Activity  Outcome Measure Difficulty turning over in bed (including adjusting bedclothes, sheets and blankets)?: A Little Difficulty moving from lying on back to sitting on the side of the bed? : A Little Difficulty sitting down on and standing up from a chair with arms (e.g., wheelchair, bedside commode, etc,.)?: Unable Help needed moving to and from a bed to chair (including a wheelchair)?: A Little Help needed walking in hospital room?: A Little Help needed climbing 3-5 steps with a railing? : A Little 6 Click Score: 16    End of Session Equipment Utilized During Treatment: Gait belt Activity Tolerance: Patient tolerated treatment well Patient left: in chair;with call bell/phone within reach;with chair alarm set;with family/visitor present Nurse Communication: Mobility status PT Visit Diagnosis: Unsteadiness on feet (R26.81);Other symptoms and signs involving the nervous system (E72.094)     Time: 7096-2836 PT Time Calculation (min) (ACUTE ONLY): 26 min   Charges:   PT Evaluation $PT Eval Moderate Complexity: 1 Mod PT Treatments $Gait Training: 8-22 mins        Leighton Ruff, PT, DPT  Acute Rehabilitation Services  Pager: [phone] Office: [phone]   Rudean Hitt 09/08/2017, 4:10 PM

## 2017-09-08 NOTE — Care Management Note (Signed)
Case Management Note  Patient Details  Name: Britnee Mcdevitt MRN: [id number] Date of Birth: 1945/12/05  Subjective/Objective:                 Brain Tumor removed 9/4   Action/Plan:  Spoke w patient at bedside. She states that she anticipated being able to DC to home w/o any HH. She states she has been ambulating in room to bathroom w/o difficulties. Awaiting PT eval for recs.  CM will continue to follow.  Expected Discharge Date:                  Expected Discharge Plan:     In-House Referral:     Discharge planning Services  CM Consult  Post Acute Care Choice:    Choice offered to:     DME Arranged:    DME Agency:     HH Arranged:    HH Agency:     Status of Service:  In process, will continue to follow  If discussed at Long Length of Stay Meetings, dates discussed:    Additional Comments:  Carles Collet, RN 09/08/2017, 3:37 PM

## 2017-09-08 NOTE — Plan of Care (Signed)
Care plan and education updated for shift. Will continue to monitor. Lianne Bushy RN BSN.

## 2017-09-09 MED ORDER — GUAIFENESIN 100 MG/5ML PO SOLN
5.0000 mL | ORAL | Status: DC | PRN
  Administered 2017-09-09: 100 mg via ORAL
  Filled 2017-09-09: qty 10

## 2017-09-09 MED ORDER — LEVETIRACETAM 500 MG PO TABS
500.0000 mg | ORAL_TABLET | Freq: Two times a day (BID) | ORAL | Status: DC
  Administered 2017-09-09 – 2017-09-10 (×2): 500 mg via ORAL
  Filled 2017-09-09 (×2): qty 1

## 2017-09-09 NOTE — Progress Notes (Signed)
PHARMACIST - PHYSICIAN COMMUNICATION  DR:   Saintclair Halsted  CONCERNING: IV to Oral Route Change Policy  RECOMMENDATION: This patient is receiving Keppra by the intravenous route.  Based on criteria approved by the Pharmacy and Therapeutics Committee, the intravenous medication(s) is/are being converted to the equivalent oral dose form(s).   DESCRIPTION: These criteria include:  The patient is eating (either orally or via tube) and/or has been taking other orally administered medications for a least 24 hours  The patient has no evidence of active gastrointestinal bleeding or impaired GI absorption (gastrectomy, short bowel, patient on TNA or NPO).  If you have questions about this conversion, please contact the Pharmacy Department  []   [phone] )  [facility] []   [phone] )  [facility] [x]   [phone] )  [facility] []   [phone] )  [facility] []   [phone] )  [place], PharmD, [place] Clinical Pharmacist Please see AMION for all Pharmacists' Contact Phone Numbers 09/09/2017, 8:52 AM

## 2017-09-09 NOTE — Care Management Important Message (Signed)
Important Message  Patient Details  Name: [name] [name] MRN: [id number] Date of Birth: May 04, 1945   Medicare Important Message Given:  Yes    Skanda Worlds Montine Circle 09/09/2017, 3:39 PM

## 2017-09-09 NOTE — Progress Notes (Signed)
Subjective: Patient reports some mild headache at times. Otherwise doing very well.   Objective: Vital signs in last 24 hours: Temp:  [98.4 F (36.9 C)-99.2 F (37.3 C)] 99 F (37.2 C) (09/06 0800) Pulse Rate:  [63-104] 63 (09/06 0800) Resp:  [13-23] 15 (09/06 0800) BP: (96-133)/(57-90) 126/67 (09/06 0800) SpO2:  [95 %-99 %] 99 % (09/06 0800)  Intake/Output from previous day: 09/05 0701 - 09/06 0700 In: 1214.2 [I.V.:997.6; IV Piggyback:216.5] Out: 278 [Urine:277; Stool:1] Intake/Output this shift: Total I/O In: 231.3 [I.V.:131.3; IV Piggyback:100] Out: -   Neurologic: Grossly normal  Lab Results: Lab Results  Component Value Date   WBC 22.6 (H) 09/07/2017   HGB 9.2 (L) 09/07/2017   HCT 27.8 (L) 09/07/2017   MCV 96.9 09/07/2017   PLT 151 09/07/2017   Lab Results  Component Value Date   INR 0.90 08/22/2017   BMET Lab Results  Component Value Date   NA 136 09/08/2017   K 4.4 09/08/2017   CL 105 09/08/2017   CO2 22 09/08/2017   GLUCOSE 137 (H) 09/08/2017   BUN 15 09/08/2017   CREATININE 0.75 09/08/2017   CALCIUM 7.7 (L) 09/08/2017    Studies/Results: Mr Jeri Cos YI Contrast  Result Date: 09/08/2017 CLINICAL DATA:  72 year old female postoperative day 1 bi-coronal craniotomy for resection of left frontal parasagittal meningioma, resection of the anterior 3rd of the superior sagittal sinus, duraplasty. 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 EXAM: MRI HEAD WITHOUT AND WITH CONTRAST TECHNIQUE: Multiplanar, multiecho pulse sequences of the brain and surrounding structures were obtained without and with intravenous contrast. CONTRAST:  73mL MULTIHANCE GADOBENATE DIMEGLUMINE 529 MG/ML IV SOLN COMPARISON:  Preoperative brain MRIs 09/04/2017 and earlier. FINDINGS: Brain: Unchanged 3.4 centimeter lobular and infiltrative homogeneously enhancing mass in the cavernous sinus and sella, eccentric to the left only the a right far lateral cavernous sinus  appears spared. Sequelae of anterior craniotomy with mild pneumocephalus and extra-axial fluid along the anterior frontal lobes. Left anterior frontal resection site is demonstrated with mild regional superficial brain restricted diffusion (series 3, image 33) and regional blood products. Stable regional T2 and FLAIR hyperintensity resembling vasogenic edema in the anterior half of the left hemisphere. However, following contrast no residual enhancing tumor at this site is identified. There is smooth postoperative appearing dural thickening over both superior convexities (series 11, image 15). Note is made of slightly irregular increased enhancement along the residual anterior falx and corresponding to the superior sagittal sinus resection margin on series 11, image 17, and also a tiny nodular focus of enhancement seen along the resection margin on series 11, image 21. There is also a small focus of restricted diffusion in the right cingulate gyrus on series 3, image 36. No other diffusion restriction or acute infarct. Largely stable intracranial mass effect including on the frontal horns. Stable ventricle size and configuration. Basilar cisterns remain patent. Negative cervicomedullary junction. Vascular: Major intracranial vascular flow voids are stable. Mildly narrow it appearance of the cavernous left ICA siphon is stable. The residual superior sagittal sinus and other major dural venous sinuses are enhancing and appear patent. Skull and upper cervical spine: Anterior craniotomy. Underlying bone marrow signal is stable including a small indeterminate area of nodular enhancement at the right parietal bone (series 10, image 42) which is probably benign. Negative visible cervical spine and spinal cord. Sinuses/Orbits: Stable abnormal left orbital apex in association with the cavernous sinus mass. The remaining orbits soft tissues are stable and negative. Paranasal sinuses  are stable in clear. Other: Mastoid air  cells remain clear. Visible internal auditory structures appear normal. Postoperative changes to the scalp. IMPRESSION: 1. Gross total resection of the anterior left frontal meningioma. Trace operative ischemia along the resection cavity margins, and a small contralateral ischemic focus in the right cingulate gyrus. Attention directed on follow-up restaging studies to several areas of mild enhancement as seen on series 11. 2. Stable cerebral edema and intracranial mass effect at this time. 3. Stable lobulated 3.4 cm cavernous sinus, intra sellar, and left orbital apex meningioma. Electronically Signed   By: Genevie Ann M.D.   On: 09/08/2017 12:32    Assessment/Plan: Postop day 2 from craniotomy for removal of meningioma. She is doing very well. Will transfer to 4NP today.    LOS: 2 days    Ocie Cornfield [facility] 09/09/2017, 9:51 AM

## 2017-09-09 NOTE — Progress Notes (Signed)
Physical Therapy Treatment Patient Details Name: [name] [name] MRN: [id number] DOB: 25-Apr-1945 Today's Date: 09/09/2017    History of Present Illness Pt is a 72 y/o female s/p L frontal sagittal meningioma resection and craniotomy. PMH includes skin cancer and osteoporosis.     PT Comments    Pt pleasant on arrival and motivated to move around, claiming "I'm your girl." Pt bed mobility and transfers independent and ambulation supervision for safety and line management. Pt overall steady, w/ one loss of balance with challenge (see DGI score below). Pt encouraged to continue moving with nursing staff. Due to one loss of balance, discussed need for assistance at home short term to ensure no falls. Pt and fiance both acknowledged and agreeable. Pt would benefit from out patient PT to ensure all balance deficits are addressed to decrease any fall risk.    Follow Up Recommendations  Outpatient PT;Supervision for mobility/OOB     Equipment Recommendations  None recommended by PT    Recommendations for Other Services       Precautions / Restrictions Precautions Precautions: Fall Restrictions Weight Bearing Restrictions: No    Mobility  Bed Mobility Overal bed mobility: Needs Assistance Bed Mobility: Supine to Sit     Supine to sit: Supervision     General bed mobility comments: supervision for safety. Pt denied any dizziness today.   Transfers Overall transfer level: Needs assistance Equipment used: None Transfers: Sit to/from Stand Sit to Stand: Supervision         General transfer comment: supervision for lines   Ambulation/Gait Ambulation/Gait assistance: Supervision Gait Distance (Feet): 500 Feet Assistive device: None Gait Pattern/deviations: Step-through pattern   Gait velocity interpretation: >4.37 ft/sec, indicative of normal walking speed General Gait Details: gait overall steady today, pt w/ one loss of balance when looking up at ceiling (during DGI) while  walking.    Stairs Stairs: Yes Stairs assistance: Supervision Stair Management: One rail Right Number of Stairs: 3 General stair comments: overall steady w/ use or R rail on ascend, and R on descent. supervision for line and safety   Wheelchair Mobility    Modified Rankin (Stroke Patients Only)       Balance Overall balance assessment: Needs assistance Sitting-balance support: No upper extremity supported;Feet supported Sitting balance-Leahy Scale: Good Sitting balance - Comments: pt able to achieve and maintain balance EOB independent.    Standing balance support: No upper extremity supported;During functional activity Standing balance-Leahy Scale: Good Standing balance comment: no support required, able to shift weight, turn, stop. one loss of balance while looking up and walking.                  Standardized Balance Assessment Standardized Balance Assessment : Dynamic Gait Index   Dynamic Gait Index Level Surface: Normal Change in Gait Speed: Normal Gait with Horizontal Head Turns: Normal Gait with Vertical Head Turns: Moderate Impairment(while looking up. looking down normal ) Gait and Pivot Turn: Normal Step Over Obstacle: Normal Step Around Obstacles: Normal Steps: Mild Impairment Total Score: 21      Cognition Arousal/Alertness: Awake/alert Behavior During Therapy: WFL for tasks assessed/performed Overall Cognitive Status: Impaired/Different from baseline Area of Impairment: Memory                     Memory: Decreased recall of precautions;Decreased short-term memory         General Comments: Pt reports short term memory deficit, trouble w/ some iADLs like checkbook balancing. Says started about a year ago.  Exercises      General Comments        Pertinent Vitals/Pain Pain Assessment: No/denies pain    Home Living                      Prior Function            PT Goals (current goals can now be found in  the care plan section) Progress towards PT goals: Progressing toward goals    Frequency    Min 3X/week      PT Plan Current plan remains appropriate    Co-evaluation              AM-PAC PT "6 Clicks" Daily Activity  Outcome Measure  Difficulty turning over in bed (including adjusting bedclothes, sheets and blankets)?: None Difficulty moving from lying on back to sitting on the side of the bed? : None Difficulty sitting down on and standing up from a chair with arms (e.g., wheelchair, bedside commode, etc,.)?: None Help needed moving to and from a bed to chair (including a wheelchair)?: None Help needed walking in hospital room?: None Help needed climbing 3-5 steps with a railing? : None 6 Click Score: 24    End of Session Equipment Utilized During Treatment: Gait belt Activity Tolerance: Patient tolerated treatment well Patient left: in chair;with family/visitor present;with call bell/phone within reach Nurse Communication: Mobility status PT Visit Diagnosis: Unsteadiness on feet (R26.81);Other symptoms and signs involving the nervous system (R29.898)     Time: 1224-4975 PT Time Calculation (min) (ACUTE ONLY): 14 min  Charges:  $Gait Training: 8-22 mins                     Samuella Bruin, [place]  Acute Rehab [phone]    Samuella Bruin 09/09/2017, 10:47 AM

## 2017-09-09 NOTE — Evaluation (Signed)
Occupational Therapy Evaluation Patient Details Name: [name] [name] MRN: [id number] DOB: 06/22/1945 Today's Date: 09/09/2017    History of Present Illness Pt is a 72 y/o female s/p L frontal sagittal meningioma resection and craniotomy. PMH includes skin cancer and osteoporosis.    Clinical Impression   PTA Pt independent in ADL/IADL managing finances/driving etc. Pt is currently mod I for ADL in room. Cognitive deficits remain and Pt would benefit from skilled OT in the acute setting as well as afterwards at the [place] (neuro) to maximize safety and independence especially with executive functioning as Pt manages finances/appointments etc, and a formal cognitive assessment would be helpful to pinpoint areas of weakness to assist patient with developing compensatory strategies. Next session plan on some formal cognitive assessment (MOCA or like test).     Follow Up Recommendations  Supervision/Assistance - 24 hour(initially) ; Outpatient OT (neuro)   Equipment Recommendations  None recommended by OT    Recommendations for Other Services Speech consult(cognitive assessment)     Precautions / Restrictions Precautions Precautions: Fall Restrictions Weight Bearing Restrictions: No      Mobility Bed Mobility Overal bed mobility: Needs Assistance Bed Mobility: Supine to Sit     Supine to sit: Supervision     General bed mobility comments: supervision for safety. Pt denied any dizziness today.   Transfers Overall transfer level: Needs assistance Equipment used: None Transfers: Sit to/from Stand Sit to Stand: Supervision         General transfer comment: supervision for lines     Balance Overall balance assessment: Mild deficits observed, not formally tested                                         ADL either performed or assessed with clinical judgement   ADL Overall ADL's : Modified independent                                        General ADL Comments: able to perform transfers, toileting (including peri care), sink level grooming, doff/don socks etc     Vision Patient Visual Report: No change from baseline Vision Assessment?: No apparent visual deficits     Perception     Praxis      Pertinent Vitals/Pain Pain Assessment: No/denies pain     Hand Dominance     Extremity/Trunk Assessment Upper Extremity Assessment Upper Extremity Assessment: Overall WFL for tasks assessed       Cervical / Trunk Assessment Cervical / Trunk Assessment: Normal   Communication Communication Communication: No difficulties   Cognition Arousal/Alertness: Awake/alert Behavior During Therapy: WFL for tasks assessed/performed Overall Cognitive Status: Impaired/Different from baseline Area of Impairment: Memory                     Memory: Decreased recall of precautions;Decreased short-term memory         General Comments: Pt reports short term memory deficit, trouble w/ some iADLs like checkbook balancing. Says started about a year ago.    General Comments       Exercises     Shoulder Instructions      Home Living Family/patient expects to be discharged to:: Private residence Living Arrangements: Alone Available Help at Discharge: Family;Available 24 hours/day(fiance - [name] going to stay with her) Type of Home:  House Home Access: Stairs to enter Technical brewer of Steps: 4 Entrance Stairs-Rails: None Home Layout: Two level Alternate Level Stairs-Number of Steps: flight  Alternate Level Stairs-Rails: Right;Left;Can reach both Bathroom Shower/Tub: Occupational psychologist: Standard Bathroom Accessibility: Yes   Home Equipment: None          Prior Functioning/Environment Level of Independence: Independent                 OT Problem List: Decreased cognition;Decreased safety awareness      OT Treatment/Interventions: Cognitive remediation/compensation;Self-care/ADL  training;Therapeutic activities    OT Goals(Current goals can be found in the care plan section) Acute Rehab OT Goals Patient Stated Goal: to go home  OT Goal Formulation: With patient Time For Goal Achievement: 09/23/17 Potential to Achieve Goals: Good ADL Goals Pt Will Perform Grooming: Independently;standing Additional ADL Goal #1: Pt will recall 2 ways of compensating for memory deficits with ADL/IADL  OT Frequency: Min 2X/week   Barriers to D/C:            Co-evaluation              AM-PAC PT "6 Clicks" Daily Activity     Outcome Measure Help from another person eating meals?: None Help from another person taking care of personal grooming?: None Help from another person toileting, which includes using toliet, bedpan, or urinal?: None Help from another person bathing (including washing, rinsing, drying)?: None Help from another person to put on and taking off regular upper body clothing?: None Help from another person to put on and taking off regular lower body clothing?: None 6 Click Score: 24   End of Session Equipment Utilized During Treatment: Gait belt Nurse Communication: Mobility status  Activity Tolerance: Patient tolerated treatment well Patient left: in bed;with call bell/phone within reach;with nursing/sitter in room  OT Visit Diagnosis: Other symptoms and signs involving cognitive function                Time: 2330-0762 OT Time Calculation (min): 26 min Charges:  OT General Charges $OT Visit: 1 Visit OT Evaluation $OT Eval Moderate Complexity: 1 Mod OT Treatments $Self Care/Home Management : 8-22 mins  Hulda Humphrey OTR/L Acute Rehabilitation Services Pager: [phone] Office: [place] 09/09/2017, 6:50 PM

## 2017-09-10 MED ORDER — HYDROCODONE-ACETAMINOPHEN 5-325 MG PO TABS: 1.0000 | ORAL_TABLET | ORAL | 0 refills | Status: DC | PRN

## 2017-09-10 MED ORDER — DEXAMETHASONE 2 MG PO TABS: 2.0000 mg | ORAL_TABLET | Freq: Three times a day (TID) | ORAL | 1 refills | Status: DC

## 2017-09-10 MED ORDER — LEVETIRACETAM 500 MG PO TABS: 500.0000 mg | ORAL_TABLET | Freq: Two times a day (BID) | ORAL | 0 refills | Status: DC

## 2017-09-10 NOTE — Care Management (Signed)
72 yr old female s/p L frontal sagittal meningioma resection and craniotomy. Case manager spoke with patient and her husband concerning need for outpatient neuro rehab.choice of locations offered, Mr. Gattuso requested the [address] location.

## 2017-09-10 NOTE — Progress Notes (Signed)
Occupational Therapy Treatment Patient Details Name: [name] [name] MRN: [id number] DOB: July 14, 1945 Today's Date: 09/10/2017    History of present illness Pt is a 71 y/o female s/p L frontal sagittal meningioma resection and craniotomy. PMH includes skin cancer and osteoporosis.    OT comments  Pt assessed with the [facility] Cognitive Assessment, demonstrating deficits with higher level executive level skills, attention and memory. Pt discussed her "inability to remember things, like getting lost when driving because I can't remember the way home" and her lack of interest in activities she once enjoyed. "I'm just depressed". Recommend pt follow up with OT in the Neuro outpt clinic to facilitate return to independence with IADL tasks and leisure activities she once enjoyed. Recommend pt seek counseling for depression if needed.  Pt very appreciaitve. Discussed with Nsg.   Follow Up Recommendations  Supervision/Assistance - 24 hour;Outpatient OT    Equipment Recommendations  None recommended by OT    Recommendations for Other Services Speech consult  Refrain from driving at this time    Precautions / Restrictions Precautions Precautions: None       Mobility Bed Mobility Overal bed mobility: Modified Independent                Transfers Overall transfer level: Modified independent                    Balance Overall balance assessment: Modified Independent                                         ADL either performed or assessed with clinical judgement   ADL Overall ADL's : At baseline                                             Vision   Vision Assessment?: No apparent visual deficits   Perception     Praxis      Cognition Arousal/Alertness: Awake/alert Behavior During Therapy: WFL for tasks assessed/performed Overall Cognitive Status: Impaired/Different from baseline Area of Impairment: Memory;Attention                    Current Attention Level: Selective Memory: Decreased short-term memory         General Comments: Assessed with the [place], scoring 26/30, demonstrating deficits with higher level executive reasoning skills and memeory.        Exercises     Shoulder Instructions       General Comments Pt Discussing concerns over her inability to remember things and safety concerns. States she would get lost driving because she could'nt remember how to get home; staes she couldn't balance her checkbook. "I'd forget things all of the time". Discussed how she has lost interest in things that she always enjoyed and just felt "depressed".     Pertinent Vitals/ Pain       Pain Assessment: No/denies pain  Home Living                                          Prior Functioning/Environment              Frequency  Min 2X/week  Progress Toward Goals  OT Goals(current goals can now be found in the care plan section)  Progress towards OT goals: Progressing toward goals  Acute Rehab OT Goals Patient Stated Goal: to go home  OT Goal Formulation: With patient Time For Goal Achievement: 09/23/17 Potential to Achieve Goals: Good ADL Goals Pt Will Perform Grooming: Independently;standing Additional ADL Goal #1: Pt will recall 2 ways of compensating for memory deficits with ADL/IADL  Plan Discharge plan remains appropriate    Co-evaluation                 AM-PAC PT "6 Clicks" Daily Activity     Outcome Measure   Help from another person eating meals?: None Help from another person taking care of personal grooming?: None Help from another person toileting, which includes using toliet, bedpan, or urinal?: None Help from another person bathing (including washing, rinsing, drying)?: None Help from another person to put on and taking off regular upper body clothing?: None Help from another person to put on and taking off regular lower body clothing?:  None 6 Click Score: 24    End of Session    OT Visit Diagnosis: Other symptoms and signs involving cognitive function   Activity Tolerance Patient tolerated treatment well   Patient Left in bed;with call bell/phone within reach;with family/visitor present   Nurse Communication Other (comment)(DC needs)        Time: 9597-4718 OT Time Calculation (min): 30 min  Charges: OT General Charges $OT Visit: 1 Visit OT Treatments $Therapeutic Activity: 23-37 mins  Maurie Boettcher, OT/L  OT Clinical Specialist [phone]    [facility] 09/10/2017, 12:09 PM

## 2017-09-10 NOTE — Discharge Summary (Signed)
  Physician Discharge Summary  Patient ID: [name] [name] MRN: [id number] DOB/AGE: [birth year] 72 y.o. Estimated body mass index is 28.51 kg/m as calculated from the following:   Height as of this encounter: 5\' 2"  (1.575 m).   Weight as of this encounter: 70.7 kg.   Admit date: 09/07/2017 Discharge date: 09/10/2017  Admission Diagnoses: Parasagittal meningioma past  Discharge Diagnoses: Same Active Problems:   Meningioma [facility])   Discharged Condition: good  Hospital Course: Patient was admitted to the hospital underwent bicoronal craniotomy for excision of parasagittal meningioma.  Postop the patient did very well went to the recovery room in the ICU maintained in the ICU for 48 hours and was transferred to the floor.  She progressed well on a tapering dose of Decadron postop MRI scan showed good excision of convexity meningioma.  Patient will be discharged on Decadron taper with scheduled follow-up in 1 to 2 weeks.  Consults: Significant Diagnostic Studies: Treatments:  bicoronal craniotomy Discharge Exam: Blood pressure (!) 148/72, pulse 60, temperature 98.4 F (36.9 C), temperature source Oral, resp. rate 18, height 5\' 2"  (1.575 m), weight 70.7 kg, SpO2 95 %. Strength 5 out of 5 neurologically intact  Disposition: Home   Allergies as of 09/10/2017      Reactions   Bee Venom Swelling      Medication List    TAKE these medications   CALCIUM 600/VITAMIN D 600-400 MG-UNIT chew tablet Generic drug:  Calcium Carbonate-Vitamin D Chew 1 tablet by mouth daily.   dexamethasone 4 MG tablet Commonly known as:  DECADRON Take 4 mg by mouth 4 (four) times daily. What changed:  Another medication with the same name was added. Make sure you understand how and when to take each.   dexamethasone 2 MG tablet Commonly known as:  DECADRON Take 1 tablet (2 mg total) by mouth 3 (three) times daily. What changed:  You were already taking a medication with the same name, and this  prescription was added. Make sure you understand how and when to take each.   ezetimibe 10 MG tablet Commonly known as:  ZETIA Take 1 tablet (10 mg total) by mouth daily.   finasteride 1 MG tablet Commonly known as:  PROPECIA Take 1 mg by mouth daily.   HAIR SKIN & NAILS GUMMIES PO Take 1 tablet by mouth daily.   HYDROcodone-acetaminophen 5-325 MG tablet Commonly known as:  NORCO/VICODIN Take 1 tablet by mouth every 4 (four) hours as needed for moderate pain.   levETIRAcetam 500 MG tablet Commonly known as:  KEPPRA Take 1 tablet (500 mg total) by mouth 2 (two) times daily.        Signed: Janise Gora P 09/10/2017, 8:28 AM

## 2017-09-12 ENCOUNTER — Telehealth: Payer: Self-pay

## 2017-09-12 NOTE — Telephone Encounter (Signed)
09/12/17  Called patient to schedule hospital follow up visit post hospitalization. States she will wait until sh sees surgeon and then will call and reschedule. Wanted to Thank Dr. Lorelei Pont for all she did prior to her surgery. Will call office and schedule appointment.

## 2017-09-16 ENCOUNTER — Other Ambulatory Visit: Payer: Self-pay | Admitting: Radiation Therapy

## 2017-09-19 ENCOUNTER — Other Ambulatory Visit: Payer: Medicare Other

## 2017-09-20 NOTE — Progress Notes (Signed)
Brain and Spine Tumor Board Documentation  [name] [name] was presented by Cecil Cobbs, MD at Brain and Spine Tumor Board on 09/20/2017, which included representatives from neuro oncology, radiation oncology, surgical oncology, genetics, navigation, pathology, radiology.  [name] [name] was presented as a new patient with history of the following treatments:  .  Additionally, we reviewed previous medical and familial history, history of present illness, and recent lab results along with all available histopathologic and imaging studies. The tumor board considered available treatment options and made the following recommendations:  Active surveillance  Recommend additional monitoring unless symptomatic, repeat MRI in 3-20mo  Tumor board is a meeting of clinicians from various specialty areas who evaluate and discuss patients for whom a multidisciplinary approach is being considered. Final determinations in the plan of care are those of the provider(s). The responsibility for follow up of recommendations given during tumor board is that of the provider.   Today's extended care, comprehensive team conference, [name] [name] was not present for the discussion and was not examined.

## 2017-09-28 ENCOUNTER — Other Ambulatory Visit: Payer: Self-pay

## 2017-09-28 ENCOUNTER — Emergency Department (HOSPITAL_BASED_OUTPATIENT_CLINIC_OR_DEPARTMENT_OTHER): Payer: Medicare Other

## 2017-09-28 ENCOUNTER — Ambulatory Visit: Payer: Self-pay | Admitting: *Deleted

## 2017-09-28 ENCOUNTER — Inpatient Hospital Stay (HOSPITAL_BASED_OUTPATIENT_CLINIC_OR_DEPARTMENT_OTHER)
Admission: EM | Admit: 2017-09-28 | Discharge: 2017-10-02 | DRG: 175 | Disposition: A | Discharge status: Home Health | Payer: Medicare Other | Attending: Internal Medicine | Admitting: Internal Medicine

## 2017-09-28 ENCOUNTER — Encounter (HOSPITAL_BASED_OUTPATIENT_CLINIC_OR_DEPARTMENT_OTHER): Payer: Self-pay | Admitting: Emergency Medicine

## 2017-09-28 DIAGNOSIS — K219 Gastro-esophageal reflux disease without esophagitis: Secondary | ICD-10-CM

## 2017-09-28 DIAGNOSIS — Z7952 Long term (current) use of systemic steroids: Secondary | ICD-10-CM | POA: Diagnosis not present

## 2017-09-28 DIAGNOSIS — D329 Benign neoplasm of meninges, unspecified: Secondary | ICD-10-CM

## 2017-09-28 DIAGNOSIS — M1990 Unspecified osteoarthritis, unspecified site: Secondary | ICD-10-CM | POA: Diagnosis not present

## 2017-09-28 DIAGNOSIS — Z86011 Personal history of benign neoplasm of the brain: Secondary | ICD-10-CM | POA: Diagnosis not present

## 2017-09-28 DIAGNOSIS — M810 Age-related osteoporosis without current pathological fracture: Secondary | ICD-10-CM

## 2017-09-28 DIAGNOSIS — R0602 Shortness of breath: Secondary | ICD-10-CM

## 2017-09-28 DIAGNOSIS — I361 Nonrheumatic tricuspid (valve) insufficiency: Secondary | ICD-10-CM | POA: Diagnosis not present

## 2017-09-28 DIAGNOSIS — E785 Hyperlipidemia, unspecified: Secondary | ICD-10-CM | POA: Diagnosis not present

## 2017-09-28 DIAGNOSIS — I5033 Acute on chronic diastolic (congestive) heart failure: Secondary | ICD-10-CM | POA: Diagnosis present

## 2017-09-28 DIAGNOSIS — R0902 Hypoxemia: Secondary | ICD-10-CM

## 2017-09-28 DIAGNOSIS — I2699 Other pulmonary embolism without acute cor pulmonale: Secondary | ICD-10-CM | POA: Diagnosis present

## 2017-09-28 DIAGNOSIS — Z8249 Family history of ischemic heart disease and other diseases of the circulatory system: Secondary | ICD-10-CM | POA: Diagnosis not present

## 2017-09-28 DIAGNOSIS — R0609 Other forms of dyspnea: Secondary | ICD-10-CM | POA: Diagnosis not present

## 2017-09-28 DIAGNOSIS — I2609 Other pulmonary embolism with acute cor pulmonale: Secondary | ICD-10-CM | POA: Diagnosis not present

## 2017-09-28 DIAGNOSIS — Z803 Family history of malignant neoplasm of breast: Secondary | ICD-10-CM

## 2017-09-28 DIAGNOSIS — Z23 Encounter for immunization: Secondary | ICD-10-CM

## 2017-09-28 DIAGNOSIS — R0600 Dyspnea, unspecified: Secondary | ICD-10-CM

## 2017-09-28 LAB — I-STAT CHEM 8, ED
BUN: 21 mg/dL (ref 8–23)
Calcium, Ion: 1.17 mmol/L (ref 1.15–1.40)
Chloride: 103 mmol/L (ref 98–111)
Creatinine, Ser: 0.9 mg/dL (ref 0.44–1.00)
Glucose, Bld: 114 mg/dL — ABNORMAL HIGH (ref 70–99)
HCT: 34 % — ABNORMAL LOW (ref 36.0–46.0)
Hemoglobin: 11.6 g/dL — ABNORMAL LOW (ref 12.0–15.0)
Potassium: 4.2 mmol/L (ref 3.5–5.1)
Sodium: 138 mmol/L (ref 135–145)
TCO2: 27 mmol/L (ref 22–32)

## 2017-09-28 LAB — CBC WITH DIFFERENTIAL/PLATELET
Basophils Absolute: 0 10*3/uL (ref 0.0–0.1)
Basophils Relative: 0 %
Eosinophils Absolute: 0.2 10*3/uL (ref 0.0–0.5)
Eosinophils Relative: 2 %
HCT: 35.8 % (ref 34.8–46.6)
Hemoglobin: 11.5 g/dL — ABNORMAL LOW (ref 11.6–15.9)
Lymphocytes Relative: 6 %
Lymphs Abs: 0.5 10*3/uL — ABNORMAL LOW (ref 0.9–3.3)
MCH: 32 pg (ref 26.0–34.0)
MCHC: 32.1 g/dL (ref 32.0–36.0)
MCV: 99.7 fL (ref 81.0–101.0)
Monocytes Absolute: 0.6 10*3/uL (ref 0.1–0.9)
Monocytes Relative: 7 %
Neutro Abs: 7.5 10*3/uL — ABNORMAL HIGH (ref 1.5–6.5)
Neutrophils Relative %: 85 %
Platelets: 158 10*3/uL (ref 145–400)
RBC: 3.59 MIL/uL — ABNORMAL LOW (ref 3.70–5.32)
RDW: 14.4 % (ref 11.1–15.7)
WBC: 8.8 10*3/uL (ref 3.9–10.0)

## 2017-09-28 LAB — TROPONIN I: Troponin I: <0.03 ng/mL (ref <0.03)

## 2017-09-28 LAB — BASIC METABOLIC PANEL
Anion gap: 12 (ref 5–15)
BUN: 19 mg/dL (ref 8–23)
CO2: 27 mmol/L (ref 22–32)
Calcium: 9 mg/dL (ref 8.9–10.3)
Chloride: 103 mmol/L (ref 98–111)
Creatinine, Ser: 0.9 mg/dL (ref 0.44–1.00)
GFR calc Af Amer: >60 mL/min (ref >60)
GFR calc non Af Amer: >60 mL/min (ref >60)
Glucose, Bld: 117 mg/dL — ABNORMAL HIGH (ref 70–99)
Potassium: 4.3 mmol/L (ref 3.5–5.1)
Sodium: 142 mmol/L (ref 135–145)

## 2017-09-28 LAB — HEPARIN LEVEL (UNFRACTIONATED): Heparin Unfractionated: 1.22 IU/mL — ABNORMAL HIGH (ref 0.30–0.70)

## 2017-09-28 LAB — I-STAT CG4 LACTIC ACID, ED: Lactic Acid, Venous: 2.07 mmol/L (ref 0.5–1.9)

## 2017-09-28 LAB — MRSA PCR SCREENING: MRSA by PCR: NEGATIVE

## 2017-09-28 MED ORDER — HYDROCODONE-ACETAMINOPHEN 5-325 MG PO TABS: 1.0000 | ORAL_TABLET | ORAL | Status: DC | PRN

## 2017-09-28 MED ORDER — IOPAMIDOL (ISOVUE-370) INJECTION 76%
100.0000 mL | Freq: Once | INTRAVENOUS | Status: AC | PRN
  Administered 2017-09-28: 100 mL via INTRAVENOUS

## 2017-09-28 MED ORDER — ONDANSETRON HCL 4 MG/2ML IJ SOLN: 4.0000 mg | Freq: Four times a day (QID) | INTRAMUSCULAR | Status: DC | PRN

## 2017-09-28 MED ORDER — EZETIMIBE 10 MG PO TABS
10.0000 mg | ORAL_TABLET | Freq: Every day | ORAL | Status: DC
  Administered 2017-09-29 – 2017-10-02 (×4): 10 mg via ORAL
  Filled 2017-09-28 (×5): qty 1

## 2017-09-28 MED ORDER — BISACODYL 5 MG PO TBEC: 5.0000 mg | DELAYED_RELEASE_TABLET | Freq: Every day | ORAL | Status: DC | PRN

## 2017-09-28 MED ORDER — ACETAMINOPHEN 650 MG RE SUPP: 650.0000 mg | Freq: Four times a day (QID) | RECTAL | Status: DC | PRN

## 2017-09-28 MED ORDER — ACETAMINOPHEN 325 MG PO TABS
650.0000 mg | ORAL_TABLET | Freq: Four times a day (QID) | ORAL | Status: DC | PRN
  Administered 2017-09-28 – 2017-10-02 (×4): 650 mg via ORAL
  Filled 2017-09-28 (×5): qty 2

## 2017-09-28 MED ORDER — SODIUM CHLORIDE 0.9 % IV SOLN
INTRAVENOUS | Status: AC
  Administered 2017-09-28: 19:00:00 via INTRAVENOUS

## 2017-09-28 MED ORDER — INSULIN ASPART 100 UNIT/ML ~~LOC~~ SOLN: 0.0000 [IU] | Freq: Three times a day (TID) | SUBCUTANEOUS | Status: DC

## 2017-09-28 MED ORDER — ONDANSETRON HCL 4 MG PO TABS: 4.0000 mg | ORAL_TABLET | Freq: Four times a day (QID) | ORAL | Status: DC | PRN

## 2017-09-28 MED ORDER — HEPARIN BOLUS VIA INFUSION
4500.0000 [IU] | Freq: Once | INTRAVENOUS | Status: AC
  Administered 2017-09-28: 4500 [IU] via INTRAVENOUS

## 2017-09-28 MED ORDER — HEPARIN (PORCINE) IN NACL 100-0.45 UNIT/ML-% IJ SOLN
900.0000 [IU]/h | INTRAMUSCULAR | Status: DC
  Administered 2017-09-28: 1150 [IU]/h via INTRAVENOUS
  Filled 2017-09-28 (×2): qty 250

## 2017-09-28 MED ORDER — SENNOSIDES-DOCUSATE SODIUM 8.6-50 MG PO TABS: 1.0000 | ORAL_TABLET | Freq: Every evening | ORAL | Status: DC | PRN

## 2017-09-28 MED ORDER — INFLUENZA VAC SPLIT HIGH-DOSE 0.5 ML IM SUSY
0.5000 mL | PREFILLED_SYRINGE | INTRAMUSCULAR | Status: AC
  Administered 2017-09-29: 0.5 mL via INTRAMUSCULAR
  Filled 2017-09-28: qty 0.5

## 2017-09-28 MED ORDER — SODIUM CHLORIDE 0.9 % IV BOLUS
1000.0000 mL | Freq: Once | INTRAVENOUS | Status: AC
  Administered 2017-09-28: 1000 mL via INTRAVENOUS

## 2017-09-28 NOTE — ED Triage Notes (Addendum)
SOB for past 2 weeks, worse today. Finished steroids on Monday

## 2017-09-28 NOTE — H&P (Signed)
History and Physical    [name] [name] WER:[id number] DOB: Oct 21, 1945 DOA: 09/28/2017  PCP: Darreld Mclean, MD Patient coming from: Home  Chief Complaint: Shortness of breath  HPI: [name] [name] is a 72 y.o. female with medical history significant of meningioma status post resection 3 weeks ago, GERD, hyperlipidemia, osteoarthritis came to the hospital with complains of shortness of breath.  Patient stated after being discharged here about 3 weeks ago, she started experiencing mild shortness of breath which continued to progress over the last 3 weeks.  Over the course of last 2 days she was not even able to make it to her bathroom without getting very dyspneic.  Due to these concerns she went to [facility] ER for evaluation.  Patient denies any personal or family history of blood clots, long distance travel.  Resection of meningioma and craniotomy was only recent surgery. She denied any chest pain, lightheadedness, dizziness, syncope, bilateral lower extremity pain or other complaints.  In the ER patient's vital signs show tachycardia with mild hypoxia requiring 2 L nasal cannula.  Labs were unremarkable but the CTA showed acute bilateral pulmonary embolism involving all the lobes and mostly they are lobular.  Is described as upper moderate clot burden without any central embolism.  There is some right ventricular strain.  Case was discussed by the ER provider with the neurosurgeon, Dr. Saintclair Halsted who advised proceeding with anticoagulation.  Patient was started on heparin drip and transferred here for further care and management.  When I saw the patient at bedside she was quite comfortable at rest requiring 2 L nasal cannula.  Denied any other complaints.    Review of Systems: As per HPI otherwise 10 point review of systems negative.  Review of Systems Otherwise negative except as per HPI, including: General: Denies fever, chills, night sweats or unintended weight loss. Resp:  Denies cough, wheezing Cardiac: Denies chest pain, palpitations, orthopnea, paroxysmal nocturnal dyspnea. GI: Denies abdominal pain, nausea, vomiting, diarrhea or constipation GU: Denies dysuria, frequency, hesitancy or incontinence MS: Denies muscle aches, joint pain or swelling Neuro: Denies headache, neurologic deficits (focal weakness, numbness, tingling), abnormal gait Psych: Denies anxiety, depression, SI/HI/AVH Skin: Denies new rashes or lesions ID: Denies sick contacts, exotic exposures, travel  Past Medical History:  Diagnosis Date  . Cancer ([place])    skin cancer   . Hyperlipidemia   . Osteoporosis   . PONV (postoperative nausea and vomiting)    with breast surgery    Past Surgical History:  Procedure Laterality Date  . APPLICATION OF CRANIAL NAVIGATION N/A 09/07/2017   Procedure: APPLICATION OF CRANIAL NAVIGATION;  Surgeon: Kary Kos, MD;  Location: [place];  Service: Neurosurgery;  Laterality: N/A;  . BREAST ENHANCEMENT SURGERY    . BUNIONECTOMY    . COLONOSCOPY    . CRANIOTOMY N/A 09/07/2017   Procedure: CRAINIOTOMY BI CORONAL w/BRAINLAB;  Surgeon: Kary Kos, MD;  Location: [place];  Service: Neurosurgery;  Laterality: N/A;  . EYE SURGERY Bilateral    cataract surgery  . SKIN CANCER EXCISION    . WISDOM TOOTH EXTRACTION      SOCIAL HISTORY:  reports that she has never smoked. She has never used smokeless tobacco. She reports that she does not drink alcohol or use drugs.  Allergies  Allergen Reactions  . Bee Venom Swelling    FAMILY HISTORY: Family History  Problem Relation Age of Onset  . Breast cancer Mother   . Heart disease Father      Prior  to Admission medications   Medication Sig Start Date End Date Taking? Authorizing Provider  ezetimibe (ZETIA) 10 MG tablet Take 1 tablet (10 mg total) by mouth daily. 07/25/17 07/25/18 Yes Copland, Gay Filler, MD  Biotin w/ Vitamins C & E (HAIR SKIN & NAILS GUMMIES PO) Take 1 tablet by mouth daily.    [provider]  Calcium Carbonate-Vitamin D (CALCIUM 600/VITAMIN D) 600-400 MG-UNIT chew tablet Chew 1 tablet by mouth daily.    [provider]  dexamethasone (DECADRON) 2 MG tablet Take 1 tablet (2 mg total) by mouth 3 (three) times daily. 09/10/17   Kary Kos, MD  dexamethasone (DECADRON) 4 MG tablet Take 4 mg by mouth 4 (four) times daily.    [provider]  finasteride (PROPECIA) 1 MG tablet Take 1 mg by mouth daily.  07/27/17   [provider]  HYDROcodone-acetaminophen (NORCO/VICODIN) 5-325 MG tablet Take 1 tablet by mouth every 4 (four) hours as needed for moderate pain. 09/10/17   Kary Kos, MD  levETIRAcetam (KEPPRA) 500 MG tablet Take 1 tablet (500 mg total) by mouth 2 (two) times daily. 09/10/17   Kary Kos, MD    Physical Exam: Vitals:   09/28/17 1402 09/28/17 1430 09/28/17 1444 09/28/17 1600  BP: 125/72 (!) 143/89  138/88  Pulse: (!) 117 (!) 117  (!) 103  Resp: 20 (!) 24    Temp:   99.2 F (37.3 C) 99.8 F (37.7 C)  TempSrc:   Oral Oral  SpO2: 99% 97%  98%  Weight:          Constitutional: NAD, calm, comfortable, on 2 L nasal cannula Eyes: PERRL, lids and conjunctivae normal ENMT: Mucous membranes are moist. Posterior pharynx clear of any exudate or lesions.Normal dentition.  Neck: normal, supple, no masses, no thyromegaly Respiratory: clear to auscultation bilaterally, no wheezing, no crackles. Normal respiratory effort. No accessory muscle use.  Cardiovascular: Sinus tachycardia with heart rate of 105, regular rate and rhythm, no murmurs / rubs / gallops. No extremity edema. 2+ pedal pulses. No carotid bruits.  Abdomen: no tenderness, no masses palpated. No hepatosplenomegaly. Bowel sounds positive.  Musculoskeletal: no clubbing / cyanosis. No joint deformity upper and lower extremities. Good ROM, no contractures. Normal muscle tone.  Skin: no rashes, lesions, ulcers. No induration Neurologic: CN 2-12 grossly intact. Sensation intact, DTR  normal. Strength 5/5 in all 4.  Psychiatric: Normal judgment and insight. Alert and oriented x 3. Normal mood.     Labs on Admission: I have personally reviewed following labs and imaging studies  CBC: Recent Labs  Lab 09/28/17 1025 09/28/17 1028  WBC 8.8  --   NEUTROABS 7.5*  --   HGB 11.5* 11.6*  HCT 35.8 34.0*  MCV 99.7  --   PLT 158  --    Basic Metabolic Panel: Recent Labs  Lab 09/28/17 1025 09/28/17 1028  NA 142 138  K 4.3 4.2  CL 103 103  CO2 27  --   GLUCOSE 117* 114*  BUN 19 21  CREATININE 0.90 0.90  CALCIUM 9.0  --    GFR: Estimated Creatinine Clearance: 53.3 mL/min (by C-G formula based on SCr of 0.9 mg/dL). Liver Function Tests: No results for input(s): AST, ALT, ALKPHOS, BILITOT, PROT, ALBUMIN in the last 168 hours. No results for input(s): LIPASE, AMYLASE in the last 168 hours. No results for input(s): AMMONIA in the last 168 hours. Coagulation Profile: No results for input(s): INR, PROTIME in the last 168 hours. Cardiac Enzymes:  Recent Labs  Lab 09/28/17 1025  TROPONINI <0.03   BNP (last 3 results) No results for input(s): PROBNP in the last 8760 hours. HbA1C: No results for input(s): HGBA1C in the last 72 hours. CBG: No results for input(s): GLUCAP in the last 168 hours. Lipid Profile: No results for input(s): CHOL, HDL, LDLCALC, TRIG, CHOLHDL, LDLDIRECT in the last 72 hours. Thyroid Function Tests: No results for input(s): TSH, T4TOTAL, FREET4, T3FREE, THYROIDAB in the last 72 hours. Anemia Panel: No results for input(s): VITAMINB12, FOLATE, FERRITIN, TIBC, IRON, RETICCTPCT in the last 72 hours. Urine analysis: No results found for: COLORURINE, APPEARANCEUR, LABSPEC, PHURINE, GLUCOSEU, HGBUR, BILIRUBINUR, KETONESUR, PROTEINUR, UROBILINOGEN, NITRITE, LEUKOCYTESUR Sepsis Labs: !!!!!!!!!!!!!!!!!!!!!!!!!!!!!!!!!!!!!!!!!!!! @LABRCNTIP (procalcitonin:4,lacticidven:4) )No results found for this or any previous visit (from the past 240 hour(s)).     Radiological Exams on Admission: Dg Chest 2 View  Result Date: 09/28/2017 CLINICAL DATA:  Shortness of breath and wheezing since last night EXAM: CHEST - 2 VIEW COMPARISON:  March 31, 2017 FINDINGS: The heart size and mediastinal contours are within normal limits. Both lungs are clear. There is scoliosis of spine. IMPRESSION: No active cardiopulmonary disease. Electronically Signed   By: Abelardo Diesel M.D.   On: 09/28/2017 11:03   Ct Angio Chest Pe W And/or Wo Contrast  Result Date: 09/28/2017 CLINICAL DATA:  Shortness of breath and cough for 2 weeks. Intracranial surgery in September 2019. EXAM: CT ANGIOGRAPHY CHEST WITH CONTRAST TECHNIQUE: Multidetector CT imaging of the chest was performed using the standard protocol during bolus administration of intravenous contrast. Multiplanar CT image reconstructions and MIPs were obtained to evaluate the vascular anatomy. CONTRAST:  126mL ISOVUE-370 IOPAMIDOL (ISOVUE-370) INJECTION 76% COMPARISON:  Chest radiograph 09/28/2017 FINDINGS: Cardiovascular: Acute bilateral pulmonary embolus is present including right middle lobar pulmonary arterial involvement and segmental involvement in the right upper lobe, right lower lobe, left upper lobe, and left lower lobe. Although there is no central pulmonary embolus within the main pulmonary artery or right and left pulmonary artery, clot burden is still moderate to high given the widespread distribution of emboli. Right ventricular to left ventricular ratio is 0.86. Mediastinum/Nodes: Right paratracheal node 0.6 cm in short axis on image 29/4. Lower right paratracheal node adjacent to the right mainstem bronchus 0.9 cm in short axis on image 37/4. No overtly pathologic thoracic adenopathy. Lungs/Pleura: Mosaic attenuation in both lungs, favoring mild edema. There is mild atelectasis in the posterior basal segment right lower lobe. Upper Abdomen: Fluid density lesion anteriorly in the lateral segment left hepatic lobe on  image 86/4 is only partially included, but statistically likely to be a cyst or similar benign lesion. Musculoskeletal: Bilateral breast implants are in place. No significant skeletal lesions are identified. Review of the MIP images confirms the above findings. IMPRESSION: 1. Acute bilateral pulmonary embolus involving all lobes of the lung. There is lobar involvement of the right middle lobar pulmonary artery and scattered segmental involvement in various segments of both upper lobes and both lower lobes. Overall upper moderate clot burden, without a large central embolus in the right or left pulmonary artery, but with a considerable amount of scattered smaller emboli bilaterally. Right ventricular to right left ventricular ratio 0.86 which is below the typical abnormal threshold value of 0.9. 2. Mild mosaic attenuation in both lungs favoring mild edema. 3. Hypodense lesion anteriorly in the lateral segment left hepatic lobe favoring a cyst. Critical Value/emergent results were called by telephone at the time of interpretation on 09/28/2017 at 12:27 pm to Dr. Lennette Bihari  CAMPOS , who verbally acknowledged these results. Electronically Signed   By: Van Clines M.D.   On: 09/28/2017 12:30     All images have been reviewed by me personally.  EKG: Independently reviewed.  Slight sinus tachycardia  Assessment/Plan Principal Problem:   Acute pulmonary embolism (HCC) Active Problems:   Hyperlipidemia   GERD   Osteoporosis   Meningioma (HCC)   Dyspnea on exertion   Hypoxia    Dyspnea on exertion and hypoxia, requiring 2 L nasal cannula Acute bilateral lobular pulmonary embolism, moderate clot burden -Admit the patient to the stepdown unit for closer monitoring.  Supplemental oxygen. -Heparin drip has been started, then transition to oral anticoagulation.  Likely NOAC -discussed with her risk and benefit of Coumadin versus NOAC - Echocardiogram and lower extremity Dopplers ordered -Supplemental  oxygen as necessary. -Supportive care.  Meningioma status post resection 3 weeks ago -Patient has been doing well postoperatively.  Case was discussed by the ER provider with Dr. Saintclair Halsted today, okay to start anticoagulation for PE.  Hyperlipidemia - On Zetia at home.  Currently med rec status is pending therefore have not resumed it yet.  GERD -PPI as necessary.  Osteoporosis -Pain control.  DVT prophylaxis: Heparin drip Code Status: Full code Family Communication: Fianc at bedside Disposition Plan: To be determined Consults called: None Admission status: Telemetry admission to stepdown unit.  Patient requires to be inpatient requiring IV heparin.   Time Spent: 65 minutes.  >50% of the time was devoted to discussing the patients care, assessment, plan and disposition with other care givers along with counseling the patient about the risks and benefits of treatment.   Please Note: This patient record was dictated using Editor, commissioning. Chart creation errors have been sought, but may not always have been located. Such creation errors do not reflect on the Standard of Medical Care.   Mikyle Sox Arsenio Loader MD [facility] Pager [phone]  If 7PM-7AM, please contact night-coverage [email or web address] Password [facility]  09/28/2017, 5:15 PM

## 2017-09-28 NOTE — ED Notes (Signed)
Report given to [place], [place] at [facility]

## 2017-09-28 NOTE — ED Provider Notes (Signed)
[facility] EMERGENCY DEPARTMENT Provider Note   CSN: [id number] Arrival date & time: 09/28/17  8675     History   Chief Complaint Chief Complaint  Patient presents with  . Shortness of Breath    HPI Modelle Vollmer is a 72 y.o. female.  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 Saintclair Halsted.  She presents the emergency department with shortness of breath over the past 2 weeks with worsening over the past several days.  She feels like she is had low-grade fever at home as well as productive cough.  She reports exertional shortness of breath without any orthopnea.  No history of DVT or pulmonary embolism.  She is concerned about the possibility developing pneumonia.  She is been ambulatory since the surgery and has been otherwise healing well.   Past Medical History:  Diagnosis Date  . Cancer ([place])    skin cancer   . Hyperlipidemia   . Osteoporosis   . PONV (postoperative nausea and vomiting)    with breast surgery    Patient Active Problem List   Diagnosis Date Noted  . Meningioma ([place]) 09/07/2017  . FATIGUE 09/12/2009  . MYALGIA 10/31/2008  . DYSPNEA ON EXERTION 10/31/2008  . HYPERLIPIDEMIA 11/11/2006  . OTHER SPECIFIED ANEMIAS 11/11/2006  . GERD 11/11/2006  . OSTEOPOROSIS 11/11/2006  . IRRITABLE BOWEL SYNDROME, HX OF 10/20/2006    Past Surgical History:  Procedure Laterality Date  . APPLICATION OF CRANIAL NAVIGATION N/A 09/07/2017   Procedure: APPLICATION OF CRANIAL NAVIGATION;  Surgeon: Kary Kos, MD;  Location: [facility];  Service: Neurosurgery;  Laterality: N/A;  . BREAST ENHANCEMENT SURGERY    . BUNIONECTOMY    . COLONOSCOPY    . CRANIOTOMY N/A 09/07/2017   Procedure: CRAINIOTOMY BI CORONAL w/BRAINLAB;  Surgeon: Kary Kos, MD;  Location: [place];  Service: Neurosurgery;  Laterality: N/A;  . EYE SURGERY Bilateral    cataract surgery  . SKIN CANCER EXCISION    . WISDOM TOOTH EXTRACTION       OB History    None      Home Medications    Prior to Admission medications   Medication Sig Start Date End Date Taking? Authorizing Provider  ezetimibe (ZETIA) 10 MG tablet Take 1 tablet (10 mg total) by mouth daily. 07/25/17 07/25/18 Yes Copland, Gay Filler, MD  Biotin w/ Vitamins C & E (HAIR SKIN & NAILS GUMMIES PO) Take 1 tablet by mouth daily.    [provider]  Calcium Carbonate-Vitamin D (CALCIUM 600/VITAMIN D) 600-400 MG-UNIT chew tablet Chew 1 tablet by mouth daily.    [provider]  dexamethasone (DECADRON) 2 MG tablet Take 1 tablet (2 mg total) by mouth 3 (three) times daily. 09/10/17   Kary Kos, MD  dexamethasone (DECADRON) 4 MG tablet Take 4 mg by mouth 4 (four) times daily.    [provider]  finasteride (PROPECIA) 1 MG tablet Take 1 mg by mouth daily.  07/27/17   [provider]  HYDROcodone-acetaminophen (NORCO/VICODIN) 5-325 MG tablet Take 1 tablet by mouth every 4 (four) hours as needed for moderate pain. 09/10/17   Kary Kos, MD  levETIRAcetam (KEPPRA) 500 MG tablet Take 1 tablet (500 mg total) by mouth 2 (two) times daily. 09/10/17   Kary Kos, MD    Family History Family History  Problem Relation Age of Onset  . Breast cancer Mother   . Heart disease Father     Social History Social  History   Tobacco Use  . Smoking status: Never Smoker  . Smokeless tobacco: Never Used  Substance Use Topics  . Alcohol use: No  . Drug use: No     Allergies   Bee venom   Review of Systems Review of Systems  All other systems reviewed and are negative.    Physical Exam Updated Vital Signs BP 140/61   Pulse (!) 114   Temp 99.6 F (37.6 C) (Oral)   Resp (!) 27   SpO2 (!) 88%   Physical Exam  Constitutional: She is oriented to person, place, and time. She appears well-developed and well-nourished. No distress.  HENT:  Head: Normocephalic and atraumatic.  Eyes: EOM are normal.  Neck: Normal range of motion.  Cardiovascular: Regular  rhythm and normal heart sounds.  Tachycardia.  Pulmonary/Chest: Breath sounds normal.  Tachypnea without accessory muscle use.  Abdominal: Soft. She exhibits no distension. There is no tenderness.  Musculoskeletal: Normal range of motion. She exhibits no edema.  Neurological: She is alert and oriented to person, place, and time.  Skin: Skin is warm and dry.  Psychiatric: She has a normal mood and affect. Judgment normal.  Nursing note and vitals reviewed.    ED Treatments / Results  Labs (all labs ordered are listed, but only abnormal results are displayed) Labs Reviewed  CBC WITH DIFFERENTIAL/PLATELET - Abnormal; Notable for the following components:      Result Value   RBC 3.59 (*)    Hemoglobin 11.5 (*)    Neutro Abs 7.5 (*)    Lymphs Abs 0.5 (*)    All other components within normal limits  BASIC METABOLIC PANEL - Abnormal; Notable for the following components:   Glucose, Bld 117 (*)    All other components within normal limits  I-STAT CG4 LACTIC ACID, ED - Abnormal; Notable for the following components:   Lactic Acid, Venous 2.07 (*)    All other components within normal limits  I-STAT CHEM 8, ED - Abnormal; Notable for the following components:   Glucose, Bld 114 (*)    Hemoglobin 11.6 (*)    HCT 34.0 (*)    All other components within normal limits  TROPONIN I  I-STAT CG4 LACTIC ACID, ED    EKG EKG Interpretation  Date/Time:  Wednesday September 28 2017 10:13:02 EDT Ventricular Rate:  126 PR Interval:    QRS Duration: 119 QT Interval:  347 QTC Calculation: 503 R Axis:   -84 Text Interpretation:  Sinus tachycardia Nonspecific IVCD with LAD Nonspecific T abnormalities, lateral leads Baseline wander in lead(s) II III aVF No old tracing to compare Confirmed by Jola Schmidt [phone]) on 09/28/2017 12:48:51 PM   Radiology Dg Chest 2 View  Result Date: 09/28/2017 CLINICAL DATA:  Shortness of breath and wheezing since last night EXAM: CHEST - 2 VIEW COMPARISON:   March 31, 2017 FINDINGS: The heart size and mediastinal contours are within normal limits. Both lungs are clear. There is scoliosis of spine. IMPRESSION: No active cardiopulmonary disease. Electronically Signed   By: Abelardo Diesel M.D.   On: 09/28/2017 11:03   Ct Angio Chest Pe W And/or Wo Contrast  Result Date: 09/28/2017 CLINICAL DATA:  Shortness of breath and cough for 2 weeks. Intracranial surgery in September 2019. EXAM: CT ANGIOGRAPHY CHEST WITH CONTRAST TECHNIQUE: Multidetector CT imaging of the chest was performed using the standard protocol during bolus administration of intravenous contrast. Multiplanar CT image reconstructions and MIPs were obtained to evaluate the vascular anatomy. CONTRAST:  159mL ISOVUE-370 IOPAMIDOL (ISOVUE-370) INJECTION 76% COMPARISON:  Chest radiograph 09/28/2017 FINDINGS: Cardiovascular: Acute bilateral pulmonary embolus is present including right middle lobar pulmonary arterial involvement and segmental involvement in the right upper lobe, right lower lobe, left upper lobe, and left lower lobe. Although there is no central pulmonary embolus within the main pulmonary artery or right and left pulmonary artery, clot burden is still moderate to high given the widespread distribution of emboli. Right ventricular to left ventricular ratio is 0.86. Mediastinum/Nodes: Right paratracheal node 0.6 cm in short axis on image 29/4. Lower right paratracheal node adjacent to the right mainstem bronchus 0.9 cm in short axis on image 37/4. No overtly pathologic thoracic adenopathy. Lungs/Pleura: Mosaic attenuation in both lungs, favoring mild edema. There is mild atelectasis in the posterior basal segment right lower lobe. Upper Abdomen: Fluid density lesion anteriorly in the lateral segment left hepatic lobe on image 86/4 is only partially included, but statistically likely to be a cyst or similar benign lesion. Musculoskeletal: Bilateral breast implants are in place. No significant skeletal  lesions are identified. Review of the MIP images confirms the above findings. IMPRESSION: 1. Acute bilateral pulmonary embolus involving all lobes of the lung. There is lobar involvement of the right middle lobar pulmonary artery and scattered segmental involvement in various segments of both upper lobes and both lower lobes. Overall upper moderate clot burden, without a large central embolus in the right or left pulmonary artery, but with a considerable amount of scattered smaller emboli bilaterally. Right ventricular to right left ventricular ratio 0.86 which is below the typical abnormal threshold value of 0.9. 2. Mild mosaic attenuation in both lungs favoring mild edema. 3. Hypodense lesion anteriorly in the lateral segment left hepatic lobe favoring a cyst. Critical Value/emergent results were called by telephone at the time of interpretation on 09/28/2017 at 12:27 pm to Dr. Jola Schmidt , who verbally acknowledged these results. Electronically Signed   By: Van Clines M.D.   On: 09/28/2017 12:30    Procedures .Critical Care Performed by: Jola Schmidt, MD Authorized by: Jola Schmidt, MD    CRITICAL CARE Performed by: Jola Schmidt Total critical care time: 32 minutes Critical care time was exclusive of separately billable procedures and treating other patients. Critical care was necessary to treat or prevent imminent or life-threatening deterioration. Critical care was time spent personally by me on the following activities: development of treatment plan with patient and/or surrogate as well as nursing, discussions with consultants, evaluation of patient's response to treatment, examination of patient, obtaining history from patient or surrogate, ordering and performing treatments and interventions, ordering and review of laboratory studies, ordering and review of radiographic studies, pulse oximetry and re-evaluation of patient's condition.   Medications Ordered in ED Medications    sodium chloride 0.9 % bolus 1,000 mL (1,000 mLs Intravenous New Bag/Given 09/28/17 1121)  iopamidol (ISOVUE-370) 76 % injection 100 mL (100 mLs Intravenous Contrast Given 09/28/17 1136)     Initial Impression / Assessment and Plan / ED Course  I have reviewed the triage vital signs and the nursing notes.  Pertinent labs & imaging results that were available during my care of the patient were reviewed by me and considered in my medical decision making (see chart for details).     Concern for possible pneumonia versus pulmonary embolism.  Patient will undergo work-up here in the emergency department including x-ray and likely CT imaging of the chest.  No increased work of breathing at this time but mild tachypnea  and tachycardia noted.  12:49 PM Patient with evidence of bilateral pulmonary emboli with high clot burden although not in the major pulmonary vessels.  This is high-volume in the subsegmental branches and lobar branches.  Patient will be initiated on heparin at this time.  Patient is 3 weeks postop from meningioma resection.  Case briefly discussed with her neurosurgeon Dr. Saintclair Halsted who agrees with heparin at this time.  She will be [facility] for ongoing treatment and evaluation and close observation.  Final Clinical Impressions(s) / ED Diagnoses   Final diagnoses:  Acute pulmonary embolism without acute cor pulmonale, unspecified pulmonary embolism type [facility])    ED Discharge Orders    None       Jola Schmidt, MD 09/28/17 1251

## 2017-09-28 NOTE — Progress Notes (Signed)
ANTICOAGULATION CONSULT NOTE - Follow Up Consult  Pharmacy Consult for heparin Indication: pulmonary embolus  Labs: Recent Labs    09/28/17 1025 09/28/17 1028 09/28/17 2231  HGB 11.5* 11.6*  --   HCT 35.8 34.0*  --   PLT 158  --   --   HEPARINUNFRC  --   --  1.22*  CREATININE 0.90 0.90  --   TROPONINI <0.03  --   --     Assessment: 72yo female supratherapeutic on heparin with initial dosing for PE; RN reports no gtt issues or overt signs of bleeding.  Goal of Therapy:  Heparin level 0.3-0.7 units/ml   Plan:  Will decrease heparin gtt by 4 units/kg/hr to 900 units/hr and check level in 6 hours.    Wynona Neat, PharmD, BCPS  09/28/2017,11:39 PM

## 2017-09-28 NOTE — ED Notes (Signed)
ED Provider at bedside. 

## 2017-09-28 NOTE — ED Notes (Signed)
Heparin drip and bolus verified with Kennieth Rad

## 2017-09-28 NOTE — Progress Notes (Signed)
ANTICOAGULATION CONSULT NOTE - Initial Consult  Pharmacy Consult for Heparin Indication: pulmonary embolus  Allergies  Allergen Reactions  . Bee Venom Swelling    Patient Measurements:    Ht: 62 in Wt 67.9 kg Heparin dosing wt 67.9 kg  Vital Signs: Temp: 99.6 F (37.6 C) (09/25 1158) Temp Source: Oral (09/25 1158) BP: 136/86 (09/25 1249) Pulse Rate: 117 (09/25 1249)  Labs: Recent Labs    09/28/17 1025 09/28/17 1028  HGB 11.5* 11.6*  HCT 35.8 34.0*  PLT 158  --   CREATININE 0.90 0.90  TROPONINI <0.03  --     CrCl cannot be calculated (Unknown ideal weight.).   Medical History: Past Medical History:  Diagnosis Date  . Cancer ([place])    skin cancer   . Hyperlipidemia   . Osteoporosis   . PONV (postoperative nausea and vomiting)    with breast surgery    Medications:  Awaiting home med rec  Assessment: 72 y.o.  F presents with SOB. CT chest shows b/l PE with high clot burdern. To begin heparin per pharmacy. Hgb 11.6, plt 158 at baseline.  From ED MD note "Patient is 3 weeks postop from meningioma resection. Case briefly discussed with her neurosurgeon Dr. Saintclair Halsted who agrees with heparin at this time."   Goal of Therapy:  Heparin level 0.3-0.7 units/ml Monitor platelets by anticoagulation protocol: Yes   Plan:  Heparin 4500 unit IV bolus Heparin gtt at 1150 units/hr Will f/u heparin level in 6 hours Daily heparin level and CBC  Sherlon Handing, PharmD, BCPS Clinical pharmacist  **Pharmacist phone directory can now be found on [email or web address] ([email or web address]).  Listed under [place].  09/28/2017,12:58 PM

## 2017-09-28 NOTE — ED Notes (Signed)
Patient transported to CT 

## 2017-09-28 NOTE — Telephone Encounter (Signed)
Pt reports dry cough, SOB, mild wheezing, temp of "A little over 101.0"  last night. States took 2 Advil at HS, afebrile this AM. Has not taken anything for cough. Pt had surgery 09/07/17, crani for meningioma. States had "A bit of a cough prior to surgery" Gradually worsening past 2 weeks. Severe today, audible wheezing. Non-productive, denies CP. States SOB with minimal exertion "Can't hardly make it across the room."  Speech halting at times during call. Pt directed to ED, states will follow disposition, family to drive. Reason for Disposition . [1] Fever > 101 F (38.3 C) AND [2] age > 45    Pt had surgery 09/07/17; crani. meningioma  Answer Assessment - Initial Assessment Questions 1. ONSET: "When did the cough begin?"      Prior to surgery 09/07/17; gradually worsening 2. SEVERITY: "How bad is the cough today?"      severe 3. RESPIRATORY DISTRESS: "Describe your breathing."      SOB with minimal exertion 4. FEVER: "Do you have a fever?" If so, ask: "What is your temperature, how was it measured, and when did it start?"     last night 101.0  Afebrile  this morning 5. HEMOPTYSIS: "Are you coughing up any blood?" If so ask: "How much?" (flecks, streaks, tablespoons, etc.)     dry 6. TREATMENT: "What have you done so far to treat the cough?" (e.g., meds, fluids, humidifier)     Advil 7. CARDIAC HISTORY: "Do you have any history of heart disease?" (e.g., heart attack, congestive heart failure)       8. LUNG HISTORY: "Do you have any history of lung disease?"  (e.g., pulmonary embolus, asthma, emphysema)      9. PE RISK FACTORS: "Do you have a history of blood clots?" (or: recent major surgery, recent prolonged travel, bedridden)     Major surgery 09/07/17 10. OTHER SYMPTOMS: "Do you have any other symptoms? (e.g., runny nose, wheezing, chest pain)       Audible wheezing  Protocols used: COUGH - ACUTE NON-PRODUCTIVE-A-AH

## 2017-09-28 NOTE — ED Notes (Signed)
Report given to Health RN with [facility] . Valuables  sent home , purse, clothes

## 2017-09-29 ENCOUNTER — Inpatient Hospital Stay (HOSPITAL_COMMUNITY): Payer: Medicare Other

## 2017-09-29 DIAGNOSIS — I361 Nonrheumatic tricuspid (valve) insufficiency: Secondary | ICD-10-CM

## 2017-09-29 DIAGNOSIS — I2699 Other pulmonary embolism without acute cor pulmonale: Secondary | ICD-10-CM

## 2017-09-29 LAB — COMPREHENSIVE METABOLIC PANEL
ALT: 19 U/L (ref 0–44)
AST: 19 U/L (ref 15–41)
Albumin: 2.5 g/dL — ABNORMAL LOW (ref 3.5–5.0)
Alkaline Phosphatase: 48 U/L (ref 38–126)
Anion gap: 8 (ref 5–15)
BUN: 5 mg/dL — ABNORMAL LOW (ref 8–23)
CO2: 26 mmol/L (ref 22–32)
Calcium: 8.4 mg/dL — ABNORMAL LOW (ref 8.9–10.3)
Chloride: 106 mmol/L (ref 98–111)
Creatinine, Ser: 0.75 mg/dL (ref 0.44–1.00)
GFR calc Af Amer: >60 mL/min (ref >60)
GFR calc non Af Amer: >60 mL/min (ref >60)
Glucose, Bld: 89 mg/dL (ref 70–99)
Potassium: 4 mmol/L (ref 3.5–5.1)
Sodium: 140 mmol/L (ref 135–145)
Total Bilirubin: 0.5 mg/dL (ref 0.3–1.2)
Total Protein: 5.5 g/dL — ABNORMAL LOW (ref 6.5–8.1)

## 2017-09-29 LAB — CBC
HCT: 32.2 % — ABNORMAL LOW (ref 36.0–46.0)
Hemoglobin: 10.3 g/dL — ABNORMAL LOW (ref 12.0–15.0)
MCH: 31.9 pg (ref 26.0–34.0)
MCHC: 32 g/dL (ref 30.0–36.0)
MCV: 99.7 fL (ref 78.0–100.0)
Platelets: 154 10*3/uL (ref 150–400)
RBC: 3.23 MIL/uL — ABNORMAL LOW (ref 3.87–5.11)
RDW: 14.5 % (ref 11.5–15.5)
WBC: 8 10*3/uL (ref 4.0–10.5)

## 2017-09-29 LAB — ECHOCARDIOGRAM COMPLETE
Height: 62.0079 in
Weight: 2469.15 oz

## 2017-09-29 LAB — GLUCOSE, CAPILLARY
Glucose-Capillary: 102 mg/dL — ABNORMAL HIGH (ref 70–99)
Glucose-Capillary: 114 mg/dL — ABNORMAL HIGH (ref 70–99)
Glucose-Capillary: 125 mg/dL — ABNORMAL HIGH (ref 70–99)
Glucose-Capillary: 77 mg/dL (ref 70–99)
Glucose-Capillary: 83 mg/dL (ref 70–99)

## 2017-09-29 LAB — HEPARIN LEVEL (UNFRACTIONATED)
Heparin Unfractionated: 1.06 IU/mL — ABNORMAL HIGH (ref 0.30–0.70)
Heparin Unfractionated: 0.29 IU/mL — ABNORMAL LOW (ref 0.30–0.70)

## 2017-09-29 MED ORDER — DM-GUAIFENESIN ER 30-600 MG PO TB12
1.0000 | ORAL_TABLET | Freq: Two times a day (BID) | ORAL | Status: DC
  Administered 2017-09-29 – 2017-10-02 (×7): 1 via ORAL
  Filled 2017-09-29 (×7): qty 1

## 2017-09-29 MED ORDER — HEPARIN (PORCINE) IN NACL 100-0.45 UNIT/ML-% IJ SOLN
800.0000 [IU]/h | INTRAMUSCULAR | Status: DC
  Administered 2017-09-29: 700 [IU]/h via INTRAVENOUS

## 2017-09-29 MED ORDER — BENZONATATE 100 MG PO CAPS
100.0000 mg | ORAL_CAPSULE | Freq: Three times a day (TID) | ORAL | Status: DC
  Administered 2017-09-29 – 2017-10-02 (×9): 100 mg via ORAL
  Filled 2017-09-29 (×9): qty 1

## 2017-09-29 NOTE — Progress Notes (Signed)
VASCULAR LAB PRELIMINARY  PRELIMINARY  PRELIMINARY  PRELIMINARY  Bilateral lower extremity venous duplex completed.    Preliminary report:  There is no DVT or SVT noted in the bilateral lower extremities.   Norena Bratton, RVT 09/29/2017, 1:51 PM

## 2017-09-29 NOTE — Progress Notes (Signed)
  Echocardiogram 2D Echocardiogram has been performed.  [name] [name] M 09/29/2017, 11:24 AM

## 2017-09-29 NOTE — Progress Notes (Signed)
[place] for Heparin Indication: pulmonary embolus  Allergies  Allergen Reactions  . Bee Venom Swelling    Patient Measurements: Height: 5' 2.01" (157.5 cm) Weight: 154 lb 5.2 oz (70 kg) IBW/kg (Calculated) : 50.12  Heparin dosing wt = 64.9 kg  Vital Signs: Temp: 98.9 F (37.2 C) (09/26 2300) Temp Source: Oral (09/26 2300) BP: 147/87 (09/26 2300) Pulse Rate: 105 (09/26 2300)  Labs: Recent Labs    09/28/17 1025 09/28/17 1028 09/28/17 2231 09/29/17 0639 09/29/17 2302  HGB 11.5* 11.6*  --  10.3*  --   HCT 35.8 34.0*  --  32.2*  --   PLT 158  --   --  154  --   HEPARINUNFRC  --   --  1.22* 1.06* 0.29*  CREATININE 0.90 0.90  --  0.75  --   TROPONINI <0.03  --   --   --   --     Estimated Creatinine Clearance: 60.9 mL/min (by C-G formula based on SCr of 0.75 mg/dL).   Medical History: Past Medical History:  Diagnosis Date  . Cancer ([place])    skin cancer   . Hyperlipidemia   . Osteoporosis   . PONV (postoperative nausea and vomiting)    with breast surgery    Assessment: 72 y.o.  F presented with SOB. CT chest shows b/l PE with high clot burdern. Pharmacy consulted 9/25 PM for heparin for PE.   Initial 6 hr heparin level was 1.22 thus heparin rate decreased.  Now 6 hour heparin level is 1.06 on lower rate of 900 units/hr. No bleeding noted per RN.  Hgb low stable at 10.3, pltc 154k.   From ED MD note "Patient is 3 weeks postop from meningioma resection. Case briefly discussed with her neurosurgeon Dr. Saintclair Halsted who agrees with heparin at this time."   9/26 PM update: heparin level is just below goal tonight after rate decrease earlier today  Goal of Therapy:  Heparin level 0.3-0.7 units/ml Monitor platelets by anticoagulation protocol: Yes   Plan:  Inc heparin drip to 800 units/hr F/U heparin level with AM labs  Narda Bonds, PharmD, [place] Pharmacist Phone: [phone]

## 2017-09-29 NOTE — Progress Notes (Addendum)
ANTICOAGULATION CONSULT NOTE -follow up  Pharmacy Consult for Heparin Indication: pulmonary embolus  Allergies  Allergen Reactions  . Bee Venom Swelling    Patient Measurements: Height: 5' 2.01" (157.5 cm) Weight: 154 lb 5.2 oz (70 kg) IBW/kg (Calculated) : 50.12  Heparin dosing wt = 64.9 kg  Vital Signs: Temp: 97.9 F (36.6 C) (09/26 0338) Temp Source: Oral (09/26 0338) BP: 127/89 (09/26 0338) Pulse Rate: 88 (09/26 0338)  Labs: Recent Labs    09/28/17 1025 09/28/17 1028 09/28/17 2231 09/29/17 0639  HGB 11.5* 11.6*  --  10.3*  HCT 35.8 34.0*  --  32.2*  PLT 158  --   --  154  HEPARINUNFRC  --   --  1.22* 1.06*  CREATININE 0.90 0.90  --   --   TROPONINI <0.03  --   --   --     Estimated Creatinine Clearance: 54.1 mL/min (by C-G formula based on SCr of 0.9 mg/dL).   Medical History: Past Medical History:  Diagnosis Date  . Cancer ([place])    skin cancer   . Hyperlipidemia   . Osteoporosis   . PONV (postoperative nausea and vomiting)    with breast surgery    Medications:  Awaiting home med rec  Assessment: 72 y.o.  F presented with SOB. CT chest shows b/l PE with high clot burdern. Pharmacy consulted 9/25 PM for heparin for PE.   Initial 6 hr heparin level was 1.22 thus heparin rate decreased.  Now 6 hour heparin level is 1.06 on lower rate of 900 units/hr. No bleeding noted per RN.  Hgb low stable at 10.3, pltc 154k.   From ED MD note "Patient is 3 weeks postop from meningioma resection. Case briefly discussed with her neurosurgeon Dr. Saintclair Halsted who agrees with heparin at this time."   Goal of Therapy:  Heparin level 0.3-0.7 units/ml Monitor platelets by anticoagulation protocol: Yes   Plan:  Hold hep gtt x 1 hour (08:25) then resume at lower rate of 700 units/hr. Will f/u heparin level in 6 hours Daily heparin level and CBC  Thank you for allowing pharmacy to be part of this patients care team.  Nicole Cella, RPh Clinical Pharmacist **Pharmacist phone  directory can now be found on [email or web address] ([email or web address]).  Listed under [place].  09/29/2017,8:19 AM

## 2017-09-30 LAB — CBC
HCT: 30.6 % — ABNORMAL LOW (ref 36.0–46.0)
Hemoglobin: 10 g/dL — ABNORMAL LOW (ref 12.0–15.0)
MCH: 31.8 pg (ref 26.0–34.0)
MCHC: 32.7 g/dL (ref 30.0–36.0)
MCV: 97.5 fL (ref 78.0–100.0)
Platelets: 148 10*3/uL — ABNORMAL LOW (ref 150–400)
RBC: 3.14 MIL/uL — ABNORMAL LOW (ref 3.87–5.11)
RDW: 13.9 % (ref 11.5–15.5)
WBC: 8.4 10*3/uL (ref 4.0–10.5)

## 2017-09-30 LAB — HEPARIN LEVEL (UNFRACTIONATED): Heparin Unfractionated: 0.37 IU/mL (ref 0.30–0.70)

## 2017-09-30 LAB — GLUCOSE, CAPILLARY
Glucose-Capillary: 102 mg/dL — ABNORMAL HIGH (ref 70–99)
Glucose-Capillary: 104 mg/dL — ABNORMAL HIGH (ref 70–99)

## 2017-09-30 MED ORDER — FUROSEMIDE 10 MG/ML IJ SOLN
20.0000 mg | Freq: Once | INTRAMUSCULAR | Status: AC
  Administered 2017-09-30: 20 mg via INTRAVENOUS
  Filled 2017-09-30: qty 2

## 2017-09-30 MED ORDER — APIXABAN 5 MG PO TABS
10.0000 mg | ORAL_TABLET | Freq: Two times a day (BID) | ORAL | Status: DC
  Administered 2017-09-30 – 2017-10-02 (×5): 10 mg via ORAL
  Filled 2017-09-30 (×4): qty 2

## 2017-09-30 MED ORDER — APIXABAN 5 MG PO TABS: 5.0000 mg | ORAL_TABLET | Freq: Two times a day (BID) | ORAL | Status: DC

## 2017-09-30 NOTE — Progress Notes (Signed)
[facility] Progress Note  Patient: [name] [name] WUJ:[id number]   PCP: Darreld Mclean, MD DOB: 03/13/1945   DOA: 09/28/2017   DOS: 09/29/2017   Date of Service: the patient was seen and examined on 09/29/2017  Brief hospital course: Pt. with PMH of meningioma S/P resection with resection 3 weeks ago, GERD, HLD, OA; admitted on 09/28/2017, presented with complaint of shortness of breath, was found to have pulmonary embolism. Currently further plan is continue antic regulation monitoring.  Subjective: More fatigue and tired.  No nausea no vomiting.  Gets short of breath even go to the bedside commode.  Assessment and Plan: Dyspnea on exertion and hypoxia, requiring 2 L nasal cannula Acute bilateral lobular pulmonary embolism, moderate clot burden -Admit the patient to the stepdown unit for closer monitoring.  Supplemental oxygen. -Heparin drip has been started, then transition to oral anticoagulation.  Likely we will transition to [place], will discuss with neuro-oncology. - Echocardiogram and lower extremity Dopplers shows no evidence of acute abnormality -Supplemental oxygen as necessary.  Incentive spirometry will be added -Supportive care.  Meningioma status post resection 3 weeks ago -Patient has been doing well postoperatively.  Case was discussed by the ER provider with Dr. Saintclair Halsted, okay to start anticoagulation for PE.  Hyperlipidemia - On Zetia at home.   GERD -PPI as necessary.  Osteoporosis -Pain control.  Diet: regular diet  Advance goals of care discussion: full code  Family Communication: family was present at bedside, at the time of interview. The pt provided permission to discuss medical plan with the family. Opportunity was given to ask question and all questions were answered satisfactorily.   Disposition:  Discharge to home once medically stable.  Consultants: none Procedures: Echocardiogram, lower extremity Doppler  Objective: Physical Exam:   09/28/17 1854 09/29/17 0624  Weight: 67.9 kg 70 kg   General: Alert, Awake and Oriented to Time, Place and Person. Appear in moderate distress, affect appropriate Eyes: PERRL, Conjunctiva normal ENT: Oral Mucosa clear moist. Neck: no JVD, no Abnormal Mass Or lumps Cardiovascular: S1 and S2 Present, no Murmur, Peripheral Pulses Present Respiratory: normal respiratory effort, Bilateral Air entry equal and Decreased, no use of accessory muscle, bilateral  Crackles, no wheezes Abdomen: Bowel Sound present, Soft and no tenderness, no hernia Skin: no redness, no Rash, no induration Extremities: no Pedal edema, no calf tenderness Neurologic: Grossly no focal neuro deficit. Bilaterally Equal motor strength  Data Reviewed: CBC: Lab 09/28/17 1025 09/28/17 1028 09/29/17 0639  WBC 8.8  --  8.0  NEUTROABS 7.5*  --   --   HGB 11.5* 11.6* 10.3*  HCT 35.8 34.0* 32.2*  MCV 99.7  --  99.7  PLT 158  --  956   Basic Metabolic Panel: Recent Labs  Lab 09/28/17 1025 09/28/17 1028 09/29/17 0639  NA 142 138 140  K 4.3 4.2 4.0  CL 103 103 106  CO2 27  --  26  GLUCOSE 117* 114* 89  BUN 19 21 5*  CREATININE 0.90 0.90 0.75  CALCIUM 9.0  --  8.4*    Liver Function Tests: Recent Labs  Lab 09/29/17 0639  AST 19  ALT 19  ALKPHOS 48  BILITOT 0.5  PROT 5.5*  ALBUMIN 2.5*   No results for input(s): LIPASE, AMYLASE in the last 168 hours. No results for input(s): AMMONIA in the last 168 hours. Coagulation Profile: No results for input(s): INR, PROTIME in the last 168 hours. Cardiac Enzymes: Recent Labs  Lab 09/28/17 1025  TROPONINI <0.03  BNP (last 3 results) No results for input(s): PROBNP in the last 8760 hours. CBG: Recent Labs  Lab 09/29/17 1201 09/29/17 1605 09/29/17 2137  GLUCAP 83 114* 102*   Studies: No results found.   Time spent: 35 minutes  Author: Berle Mull, MD [facility] Pager: [phone] 09/29/2017 6:08 PM  If 7PM-7AM, please contact  night-coverage at [email or web address], password [email or web address]

## 2017-09-30 NOTE — Care Management Note (Signed)
Case Management Note  Patient Details  Name: Naarah Borgerding MRN: [id number] Date of Birth: [birth year]  Subjective/Objective:   From home with fiance, presents with Bil PE's, on hep drip, changing to eliquis today, NCM gave patient the 30 day free coupon and informed her about her co pay for 40.00 for her refills. Rod Holler the pharmacist is going to speak to her about side effects of the medication.                     Action/Plan: DC when ready.  Expected Discharge Date:                  Expected Discharge Plan:  Home/Self Care  In-House Referral:     Discharge planning Services  CM Consult, Medication Assistance  Post Acute Care Choice:    Choice offered to:     DME Arranged:    DME Agency:     HH Arranged:    HH Agency:     Status of Service:  Completed, signed off  If discussed at [employer] of Stay Meetings, dates discussed:    Additional Comments:  Zenon Mayo, RN 09/30/2017, 2:50 PM

## 2017-09-30 NOTE — Care Management (Signed)
09-30-17  BENEFITS CHECK :  # 2.  S/W  SEN @ [employer]/ [employer] RX #         [phone]  ELIQUIS  10 MG - NONE FORMULARY  1. ELIQUIS  2.5 MG BID COVER-YES CO-PAY- $ 40.00  Q/L  TWO PILL PER DAY TIER- 3 DRUG PRIOR APPROVAL- NO  2. ELIQUIS  5 MG  BID COVER- YES CO-PAY- $ 40.00  Q/L TWO PILL PER DAY TIER-0 3 DRUG PRIOR APPROVAL- NO  3. XARELTO  15 MG BID COVER- YES CO-PAY- $ 40.00 Q/L TWO PILL PER DAY TIER- 3 DRUG PRIOR APPROVAL- NO  4. XARELTO  20 MG DAILY COVER- YES CO-PAY- $ 40.00 TIER- 3 DRUG PRIOR APPROVAL- NO  5. LOVENOX  80 MG SQ BID  ( 70 MG NOT AVAILABLE ) COVER- NONE FORMULARY PRIOR APPROVAL- YES [phone] OPT- 5 FOR EXCEPTION  6. ENOXAPARIN  80 MG SQ  BID COVER- YES AND NONE PREFERRED CO-PAY- 50 % OF TOTAL COST TIER- 4 DRUG PRIOR APPROVAL- NO  NO DEDUCTIBLE  PREFERRED PHARMACY : YES [facility], [facility] AND [facility]

## 2017-09-30 NOTE — Progress Notes (Signed)
ANTICOAGULATION CONSULT NOTE -follow up  Pharmacy Consult for Heparin switching to Eliquis Indication: pulmonary embolus  Allergies  Allergen Reactions  . Bee Venom Swelling    Patient Measurements: Height: 5' 2.01" (157.5 cm) Weight: 149 lb 4 oz (67.7 kg) IBW/kg (Calculated) : 50.12  Heparin dosing wt = 64.9 kg  Vital Signs: Temp: 99.9 F (37.7 C) (09/27 1326) Temp Source: Oral (09/27 1326) BP: 111/71 (09/27 1326) Pulse Rate: 114 (09/27 1326)  Labs: Recent Labs    09/28/17 1025 09/28/17 1028  09/29/17 0639 09/29/17 2302 09/30/17 0611  HGB 11.5* 11.6*  --  10.3*  --  10.0*  HCT 35.8 34.0*  --  32.2*  --  30.6*  PLT 158  --   --  154  --  148*  HEPARINUNFRC  --   --    < > 1.06* 0.29* 0.37  CREATININE 0.90 0.90  --  0.75  --   --   TROPONINI <0.03  --   --   --   --   --    < > = values in this interval not displayed.    Estimated Creatinine Clearance: 59.8 mL/min (by C-G formula based on SCr of 0.75 mg/dL).   Medical History: Past Medical History:  Diagnosis Date  . Cancer ([place])    skin cancer   . Hyperlipidemia   . Osteoporosis   . PONV (postoperative nausea and vomiting)    with breast surgery    Medications:  Awaiting home med rec  Assessment: 72 y.o.  Female currently on IV heparin drip for acute  s b/l PE with high clot burdern. This AM the  heparin level was 0.37, therapeutic on heparin rate 800 units/hr. Hgb low stable at 10.0, pltc 148k.  No bleeding noted.  -- Pharmacy now consulted to switch her anticoagulation to oral Eliquis (apixaban).   SCr 0.75 wnl, CrCl 59 ml/min.    Goal of Therapy:  Monitor platelets by anticoagulation protocol: Yes   Plan:  Discontinue IV heparin and at same time give first dose of Eliquis 10mg  bid x 7 days then on 10/07/17 reduce to 5 mg BID. Monitor for signs and symptoms of bleeding and renal function.   Thank you for allowing pharmacy to be part of this patients care team.  Nicole Cella, RPh Clinical  Pharmacist Please check AMION for all [place] phone numbers After 10:00 PM, call [place] [phone] 09/30/2017,1:40 PM

## 2017-09-30 NOTE — Progress Notes (Signed)
[facility] Progress Note  Patient: [name] [name] GHW:[id number]   PCP: Darreld Mclean, MD DOB: 08-Feb-1945   DOA: 09/28/2017   DOS: 09/30/2017   Date of Service: the patient was seen and examined on 09/30/2017  Brief hospital course: Pt. with PMH of meningioma S/P resection with resection 3 weeks ago, GERD, HLD, OA; admitted on 09/28/2017, presented with complaint of shortness of breath, was found to have pulmonary embolism. Currently further plan is continue antic regulation monitoring.  Subjective: Feeling better still feeling fatigue and tired.  No nausea no vomiting.  No chest pain.  Occasional dizziness.  Continues to have shortness of breath even at rest as well as on exertion.  Heart rate remains in 120s  Assessment and Plan: Dyspnea on exertion and hypoxia, requiring 2 L nasal cannula Acute bilateral lobular pulmonary embolism, moderate clot burden -Admit the patient to the stepdown unit for closer monitoring.  Supplemental oxygen. -Heparin drip has been started, then transition to oral anticoagulation.  Discussed with neuro oncology and recommended to transition to NOAC, patient chose Eliquis - Echocardiogram and lower extremity Dopplers shows no evidence of acute abnormality -Supplemental oxygen as necessary. -Supportive care.  Meningioma status post resection 3 weeks ago -Patient has been doing well postoperatively.  Case was discussed by the ER provider with Dr. Saintclair Halsted, okay to start anticoagulation for PE.  Hyperlipidemia - On Zetia at home.   GERD -PPI as necessary.  Osteoporosis -Pain control.  Acute on chronic diastolic CHF. We will give IV Lasix x1 and monitor.  Diet: regular diet  Advance goals of care discussion: full code  Family Communication: family was present at bedside, at the time of interview. The pt provided permission to discuss medical plan with the family. Opportunity was given to ask question and all questions were answered  satisfactorily.   Disposition:  Discharge to home tomorrow.  Consultants: none Procedures: none  Scheduled Meds: . apixaban  10 mg Oral BID   Followed by  . [START ON 10/07/2017] apixaban  5 mg Oral BID  . benzonatate  100 mg Oral TID  . dextromethorphan-guaiFENesin  1 tablet Oral BID  . ezetimibe  10 mg Oral Daily   Continuous Infusions: PRN Meds: acetaminophen **OR** acetaminophen, bisacodyl, HYDROcodone-acetaminophen, ondansetron **OR** ondansetron (ZOFRAN) IV, senna-docusate Antibiotics: Anti-infectives (From admission, onward)   None       Objective: Physical Exam: Vitals:   09/30/17 0822 09/30/17 1326 09/30/17 1705 09/30/17 1705  BP: 103/75 111/71 133/84   Pulse: (!) 116 (!) 114 (!) 111 (!) 111  Resp: 19 (!) 22 20   Temp: 99 F (37.2 C) 99.9 F (37.7 C) (!) 101.3 F (38.5 C) (!) 101.7 F (38.7 C)  TempSrc: Oral Oral Oral Oral  SpO2: (!) 85% 92% 91% 92%  Weight:      Height:        Intake/Output Summary (Last 24 hours) at 09/30/2017 1804 Last data filed at 09/30/2017 1800 Gross per 24 hour  Intake 737.78 ml  Output -  Net 737.78 ml   Filed Weights   09/28/17 1854 09/29/17 0624 09/30/17 0312  Weight: 67.9 kg 70 kg 67.7 kg   General: Alert, Awake and Oriented to Time, Place and Person. Appear in moderate distress, affect appropriate Eyes: PERRL, Conjunctiva normal ENT: Oral Mucosa clear moist. Neck: no JVD, no Abnormal Mass Or lumps Cardiovascular: S1 and S2 Present, no Murmur, Peripheral Pulses Present Respiratory: normal respiratory effort, Bilateral Air entry equal and Decreased, no use of accessory muscle, bilateral  Crackles, no wheezes Abdomen: Bowel Sound present, Soft and no tenderness, no hernia Skin: no redness, no Rash, no induration Extremities: no Pedal edema, no calf tenderness Neurologic: Grossly no focal neuro deficit. Bilaterally Equal motor strength  Data Reviewed: CBC: Recent Labs  Lab 09/28/17 1025 09/28/17 1028 09/29/17 0639  09/30/17 0611  WBC 8.8  --  8.0 8.4  NEUTROABS 7.5*  --   --   --   HGB 11.5* 11.6* 10.3* 10.0*  HCT 35.8 34.0* 32.2* 30.6*  MCV 99.7  --  99.7 97.5  PLT 158  --  154 183*   Basic Metabolic Panel: Recent Labs  Lab 09/28/17 1025 09/28/17 1028 09/29/17 0639  NA 142 138 140  K 4.3 4.2 4.0  CL 103 103 106  CO2 27  --  26  GLUCOSE 117* 114* 89  BUN 19 21 5*  CREATININE 0.90 0.90 0.75  CALCIUM 9.0  --  8.4*    Liver Function Tests: Recent Labs  Lab 09/29/17 0639  AST 19  ALT 19  ALKPHOS 48  BILITOT 0.5  PROT 5.5*  ALBUMIN 2.5*   No results for input(s): LIPASE, AMYLASE in the last 168 hours. No results for input(s): AMMONIA in the last 168 hours. Coagulation Profile: No results for input(s): INR, PROTIME in the last 168 hours. Cardiac Enzymes: Recent Labs  Lab 09/28/17 1025  TROPONINI <0.03   BNP (last 3 results) No results for input(s): PROBNP in the last 8760 hours. CBG: Recent Labs  Lab 09/29/17 1201 09/29/17 1605 09/29/17 2137 09/30/17 0804 09/30/17 1218  GLUCAP 83 114* 102* 102* 104*   Studies: No results found.   Time spent: 35 minutes  Author: Berle Mull, MD [facility] Pager: [phone] 09/30/2017 6:04 PM  If 7PM-7AM, please contact night-coverage at [email or web address], password [facility]

## 2017-09-30 NOTE — Care Management Important Message (Signed)
Important Message  Patient Details  Name: [name] [name] MRN: [id number] Date of Birth: 10-16-1945   Medicare Important Message Given:  Yes    [name] [name] 09/30/2017, 3:17 PM

## 2017-10-01 ENCOUNTER — Inpatient Hospital Stay (HOSPITAL_COMMUNITY): Payer: Medicare Other

## 2017-10-01 LAB — CBC
HCT: 32.7 % — ABNORMAL LOW (ref 36.0–46.0)
Hemoglobin: 10.4 g/dL — ABNORMAL LOW (ref 12.0–15.0)
MCH: 31.3 pg (ref 26.0–34.0)
MCHC: 31.8 g/dL (ref 30.0–36.0)
MCV: 98.5 fL (ref 78.0–100.0)
Platelets: 172 10*3/uL (ref 150–400)
RBC: 3.32 MIL/uL — ABNORMAL LOW (ref 3.87–5.11)
RDW: 13.9 % (ref 11.5–15.5)
WBC: 8.1 10*3/uL (ref 4.0–10.5)

## 2017-10-01 LAB — GLUCOSE, CAPILLARY
Glucose-Capillary: 101 mg/dL — ABNORMAL HIGH (ref 70–99)
Glucose-Capillary: 134 mg/dL — ABNORMAL HIGH (ref 70–99)

## 2017-10-01 MED ORDER — FUROSEMIDE 10 MG/ML IJ SOLN
20.0000 mg | Freq: Once | INTRAMUSCULAR | Status: AC
  Administered 2017-10-01: 20 mg via INTRAVENOUS
  Filled 2017-10-01: qty 2

## 2017-10-01 MED ORDER — SODIUM CHLORIDE 0.9% FLUSH
3.0000 mL | Freq: Two times a day (BID) | INTRAVENOUS | Status: DC
  Administered 2017-10-01: 3 mL via INTRAVENOUS

## 2017-10-01 MED ORDER — SODIUM CHLORIDE 0.9% FLUSH: 3.0000 mL | INTRAVENOUS | Status: DC | PRN

## 2017-10-01 NOTE — Progress Notes (Signed)
[facility] Progress Note  Patient: [name] [name] OBS:[id number]   PCP: Darreld Mclean, MD DOB: 1945/03/14   DOA: 09/28/2017   DOS: 10/01/2017   Date of Service: the patient was seen and examined on 10/01/2017  Brief hospital course: Pt. with PMH of meningioma S/P resection with resection 3 weeks ago, GERD, HLD, OA; admitted on 09/28/2017, presented with complaint of shortness of breath, was found to have pulmonary embolism. Currently further plan is continue antic regulation monitoring.  Subjective: Still short of breath and having cough.  Heart rate still remains elevated.  No nausea no vomiting no fever no chills.  No blood in the stool.  Assessment and Plan: Dyspnea on exertion and hypoxia, requiring 2 L nasal cannula Acute bilateral lobular pulmonary embolism, moderate clot burden -Admit the patient to the stepdown unit for closer monitoring.  Supplemental oxygen. -Heparin drip has been started, then transition to oral anticoagulation.  Discussed with neuro oncology and recommended to transition to NOAC, patient chose Eliquis - Echocardiogram and lower extremity Dopplers shows no evidence of acute abnormality -Supplemental oxygen as necessary. -Supportive care.  Meningioma status post resection 3 weeks ago -Patient has been doing well postoperatively.  Case was discussed by the ER provider with Dr. Saintclair Halsted, okay to start anticoagulation for PE.  Hyperlipidemia - On Zetia at home.   GERD -PPI as necessary.  Osteoporosis -Pain control.  Acute on chronic diastolic CHF. We will give another dose of IV Lasix and monitor.  Diet: regular diet  Advance goals of care discussion: full code  Family Communication: family was present at bedside, at the time of interview. The pt provided permission to discuss medical plan with the family. Opportunity was given to ask question and all questions were answered satisfactorily.   Disposition:  Discharge to home  tomorrow.  Consultants: none Procedures: none  Scheduled Meds: . apixaban  10 mg Oral BID   Followed by  . [START ON 10/07/2017] apixaban  5 mg Oral BID  . benzonatate  100 mg Oral TID  . dextromethorphan-guaiFENesin  1 tablet Oral BID  . ezetimibe  10 mg Oral Daily  . sodium chloride flush  3 mL Intravenous Q12H   Continuous Infusions: PRN Meds: acetaminophen **OR** acetaminophen, bisacodyl, HYDROcodone-acetaminophen, ondansetron **OR** ondansetron (ZOFRAN) IV, senna-docusate, sodium chloride flush Antibiotics: Anti-infectives (From admission, onward)   None       Objective: Physical Exam: Vitals:   09/30/17 2348 10/01/17 0304 10/01/17 0809 10/01/17 1215  BP: (!) 126/96 116/73 (!) 104/92 123/89  Pulse: 95  (!) 106 (!) 112  Resp: 20 17 19  (!) 24  Temp: 98.2 F (36.8 C) 99.7 F (37.6 C)  (!) 100.8 F (38.2 C)  TempSrc: Oral Oral  Tympanic  SpO2: 97%   96%  Weight:      Height:        Intake/Output Summary (Last 24 hours) at 10/01/2017 1642 Last data filed at 10/01/2017 0900 Gross per 24 hour  Intake 480 ml  Output -  Net 480 ml   Filed Weights   09/28/17 1854 09/29/17 0624 09/30/17 0312  Weight: 67.9 kg 70 kg 67.7 kg   General: Alert, Awake and Oriented to Time, Place and Person. Appear in moderate distress, affect appropriate Eyes: PERRL, Conjunctiva normal ENT: Oral Mucosa clear moist. Neck: no JVD, no Abnormal Mass Or lumps Cardiovascular: S1 and S2 Present, no Murmur, Peripheral Pulses Present Respiratory: normal respiratory effort, Bilateral Air entry equal and Decreased, no use of accessory muscle, bilateral  Crackles,  no wheezes Abdomen: Bowel Sound present, Soft and no tenderness, no hernia Skin: no redness, no Rash, no induration Extremities: no Pedal edema, no calf tenderness Neurologic: Grossly no focal neuro deficit. Bilaterally Equal motor strength  Data Reviewed: CBC: Recent Labs  Lab 09/28/17 1025 09/28/17 1028 09/29/17 0639 09/30/17 0611  10/01/17 0108  WBC 8.8  --  8.0 8.4 8.1  NEUTROABS 7.5*  --   --   --   --   HGB 11.5* 11.6* 10.3* 10.0* 10.4*  HCT 35.8 34.0* 32.2* 30.6* 32.7*  MCV 99.7  --  99.7 97.5 98.5  PLT 158  --  154 148* 374   Basic Metabolic Panel: Recent Labs  Lab 09/28/17 1025 09/28/17 1028 09/29/17 0639  NA 142 138 140  K 4.3 4.2 4.0  CL 103 103 106  CO2 27  --  26  GLUCOSE 117* 114* 89  BUN 19 21 5*  CREATININE 0.90 0.90 0.75  CALCIUM 9.0  --  8.4*    Liver Function Tests: Recent Labs  Lab 09/29/17 0639  AST 19  ALT 19  ALKPHOS 48  BILITOT 0.5  PROT 5.5*  ALBUMIN 2.5*   No results for input(s): LIPASE, AMYLASE in the last 168 hours. No results for input(s): AMMONIA in the last 168 hours. Coagulation Profile: No results for input(s): INR, PROTIME in the last 168 hours. Cardiac Enzymes: Recent Labs  Lab 09/28/17 1025  TROPONINI <0.03   BNP (last 3 results) No results for input(s): PROBNP in the last 8760 hours. CBG: Recent Labs  Lab 09/29/17 1605 09/29/17 2137 09/30/17 0804 09/30/17 1218 10/01/17 0815  GLUCAP 114* 102* 102* 104* 101*   Studies: Dg Chest 2 View  Result Date: 10/01/2017 CLINICAL DATA:  Shortness of breath for 1 month EXAM: CHEST - 2 VIEW COMPARISON:  Chest radiograph and chest CT September 28, 2017 FINDINGS: There are scattered areas of scarring in both mid and lower lung zones. There is atelectatic change in the lung bases as well. There is no frank edema or consolidation. The heart size and pulmonary vascularity are normal. No adenopathy. There is lower thoracic levoscoliosis with upper lumbar dextroscoliosis. There are evident breast implants bilaterally. No evident bone lesions. IMPRESSION: Areas of scarring and atelectatic change bilaterally. No frank edema or consolidation. Stable cardiac silhouette. No evident adenopathy. Electronically Signed   By: Lowella Grip III M.D.   On: 10/01/2017 10:59     Time spent: 35 minutes  Author: Berle Mull,  MD [facility] Pager: [phone] 10/01/2017 4:42 PM  If 7PM-7AM, please contact night-coverage at [email or web address], password [facility]

## 2017-10-01 NOTE — Evaluation (Signed)
Physical Therapy Evaluation Patient Details Name: [name] [name] MRN: [id number] DOB: [birth year] Today's Date: 10/01/2017   History of Present Illness  Pt is a 72 y/o female admitted secondary to SOB and found to have an acute PE. Heparin drip was initiated. Of note, pt with recent meningioma resection on 09/07/17. PMH including but not limited to HLD and osteoporosis.    Clinical Impression  Pt presented sitting OOB in recliner chair, awake and willing to participate in therapy session. Pt limited this session secondary to fatigue and increased HR with minimal exertion. Prior to admission, pt reported that she was independent. She stated she had planned to begin OP therapy services to mostly work on her memory/cognition. Will attempt further evaluation and assessment at next session if appropriate. Pt would continue to benefit from skilled physical therapy services at this time while admitted and after d/c to address the below listed limitations in order to improve overall safety and independence with functional mobility.     Follow Up Recommendations Home health PT;Supervision for mobility/OOB    Equipment Recommendations  None recommended by PT    Recommendations for Other Services       Precautions / Restrictions Precautions Precautions: None Precaution Comments: monitor HR, SPO2 Restrictions Weight Bearing Restrictions: No      Mobility  Bed Mobility               General bed mobility comments: pt OOB in straight back chair upon arrival  Transfers Overall transfer level: Needs assistance Equipment used: None Transfers: Sit to/from Stand;Stand Pivot Transfers Sit to Stand: Min guard Stand pivot transfers: Min guard       General transfer comment: min guard for safety  Ambulation/Gait             General Gait Details: deferred secondary to increased HR to 127 bpm with just standing, will attempt next session  Stairs            Wheelchair  Mobility    Modified Rankin (Stroke Patients Only)       Balance Overall balance assessment: No apparent balance deficits (not formally assessed)                                           Pertinent Vitals/Pain Pain Assessment: No/denies pain    Home Living Family/patient expects to be discharged to:: Private residence Living Arrangements: Spouse/significant other Available Help at Discharge: Family;Available 24 hours/day Type of Home: House Home Access: Stairs to enter Entrance Stairs-Rails: None Entrance Stairs-Number of Steps: 4 Home Layout: Two level Home Equipment: None      Prior Function Level of Independence: Independent               Hand Dominance        Extremity/Trunk Assessment   Upper Extremity Assessment Upper Extremity Assessment: Overall WFL for tasks assessed    Lower Extremity Assessment Lower Extremity Assessment: Overall WFL for tasks assessed    Cervical / Trunk Assessment Cervical / Trunk Assessment: Normal  Communication   Communication: No difficulties  Cognition Arousal/Alertness: Awake/alert Behavior During Therapy: WFL for tasks assessed/performed Overall Cognitive Status: Impaired/Different from baseline Area of Impairment: Memory                     Memory: Decreased short-term memory  General Comments      Exercises     Assessment/Plan    PT Assessment Patient needs continued PT services  PT Problem List Decreased activity tolerance;Decreased mobility;Decreased coordination;Decreased cognition;Decreased safety awareness;Decreased knowledge of precautions;Cardiopulmonary status limiting activity       PT Treatment Interventions DME instruction;Gait training;Stair training;Functional mobility training;Therapeutic activities;Therapeutic exercise;Balance training;Neuromuscular re-education;Cognitive remediation;Patient/family education    PT Goals (Current goals can be  found in the Care Plan section)  Acute Rehab PT Goals Patient Stated Goal: to return home ASAP PT Goal Formulation: With patient Time For Goal Achievement: 10/15/17 Potential to Achieve Goals: Good    Frequency Min 3X/week   Barriers to discharge        Co-evaluation               AM-PAC PT "6 Clicks" Daily Activity  Outcome Measure Difficulty turning over in bed (including adjusting bedclothes, sheets and blankets)?: None Difficulty moving from lying on back to sitting on the side of the bed? : None Difficulty sitting down on and standing up from a chair with arms (e.g., wheelchair, bedside commode, etc,.)?: A Little Help needed moving to and from a bed to chair (including a wheelchair)?: None Help needed walking in hospital room?: A Little Help needed climbing 3-5 steps with a railing? : A Little 6 Click Score: 21    End of Session Equipment Utilized During Treatment: Oxygen Activity Tolerance: Patient limited by fatigue Patient left: in chair;with family/visitor present;with call bell/phone within reach Nurse Communication: Mobility status PT Visit Diagnosis: Other abnormalities of gait and mobility (R26.89)    Time: 4580-9983 PT Time Calculation (min) (ACUTE ONLY): 25 min   Charges:   PT Evaluation $PT Eval Moderate Complexity: 1 Mod PT Treatments $Therapeutic Activity: 8-22 mins        Sherie Don, PT, DPT  Acute Rehabilitation Services Pager [phone] Office [place] 10/01/2017, 4:23 PM

## 2017-10-01 NOTE — Plan of Care (Signed)
Patient is alert and oriented, no complaints of pain, had a bowel movement on this shift, was able to assist staff with transfer to the [facility], tele monitoring, monitor Vs, labs and I&Os, will continue to monitor  Problem: Education: Goal: Knowledge of General Education information will improve Description Including pain rating scale, medication(s)/side effects and non-pharmacologic comfort measures Outcome: Progressing   Problem: Health Behavior/Discharge Planning: Goal: Ability to manage health-related needs will improve Outcome: Progressing   Problem: Clinical Measurements: Goal: Ability to maintain clinical measurements within normal limits will improve Outcome: Progressing Goal: Will remain free from infection Outcome: Progressing Goal: Diagnostic test results will improve Outcome: Progressing Goal: Respiratory complications will improve Outcome: Progressing Goal: Cardiovascular complication will be avoided Outcome: Progressing   Problem: Activity: Goal: Risk for activity intolerance will decrease Outcome: Progressing   Problem: Nutrition: Goal: Adequate nutrition will be maintained Outcome: Progressing   Problem: Coping: Goal: Level of anxiety will decrease Outcome: Progressing   Problem: Elimination: Goal: Will not experience complications related to bowel motility Outcome: Progressing Goal: Will not experience complications related to urinary retention Outcome: Progressing   Problem: Pain Managment: Goal: General experience of comfort will improve Outcome: Progressing   Problem: Safety: Goal: Ability to remain free from injury will improve Outcome: Progressing   Problem: Skin Integrity: Goal: Risk for impaired skin integrity will decrease Outcome: Progressing   Problem: Spiritual Needs Goal: Ability to function at adequate level Outcome: Progressing

## 2017-10-02 DIAGNOSIS — I2699 Other pulmonary embolism without acute cor pulmonale: Principal | ICD-10-CM

## 2017-10-02 LAB — RNA QUALITATIVE: HIV 1 RNA Qualitative: 1

## 2017-10-02 LAB — HIV ANTIBODY (ROUTINE TESTING W REFLEX): HIV Screen 4th Generation wRfx: REACTIVE — AB

## 2017-10-02 LAB — HIV 1/2 AB DIFFERENTIATION
HIV 1 Ab: NEGATIVE
HIV 2 Ab: NEGATIVE
Note: NEGATIVE

## 2017-10-02 MED ORDER — FUROSEMIDE 20 MG PO TABS: 20.0000 mg | ORAL_TABLET | Freq: Every day | ORAL | 0 refills | Status: DC | PRN

## 2017-10-02 MED ORDER — BENZONATATE 100 MG PO CAPS: 100.0000 mg | ORAL_CAPSULE | Freq: Three times a day (TID) | ORAL | 0 refills | Status: DC

## 2017-10-02 MED ORDER — ELIQUIS 5 MG VTE STARTER PACK: ORAL_TABLET | ORAL | 0 refills | Status: DC

## 2017-10-02 MED ORDER — DM-GUAIFENESIN ER 30-600 MG PO TB12: 1.0000 | ORAL_TABLET | Freq: Two times a day (BID) | ORAL | 0 refills | Status: DC

## 2017-10-02 NOTE — Care Management Note (Signed)
Case Management Note  Patient Details  Name: [name] [name] MRN: [id number] Date of Birth: Aug 21, 1945  Subjective/Objective:          Pt previously assessed by CM and given Eliquis information and cards. DC today with [facility].            Action/Plan: Agency choice presented to patient. Pt has no preference of [place] agency and agreeable to [facility].  Jermaine, [facility] liaison, contacted and accepted referral for [facility].  Pt aware of plan for [facility] to contact her for visit by Tuesday.    Expected Discharge Date:  10/02/17               Expected Discharge Plan:  [place]  In-House Referral:  NA  Discharge planning Services  CM Consult, Medication Assistance  Post Acute Care Choice:  Home Health Choice offered to:  Patient  DME Arranged:  N/A DME Agency:  NA  HH Arranged:  PT HH Agency:  [place]  Status of Service:  Completed, signed off  If discussed at [place] of Stay Meetings, dates discussed:    Additional Comments:  Claudie Leach, RN 10/02/2017, 9:16 AM

## 2017-10-02 NOTE — Care Management (Signed)
Oxygen documentation and DME oxygen order in.  [facility] will deliver portable oxygen tank to room prior to dc.

## 2017-10-02 NOTE — Discharge Instructions (Signed)
Information on my medicine - ELIQUIS (apixaban)  This medication education was reviewed with me or my healthcare representative as part of my discharge preparation.    Why was Eliquis prescribed for you? Eliquis was prescribed to treat blood clots that may have been found in the veins of your legs (deep vein thrombosis) or in your lungs (pulmonary embolism) and to reduce the risk of them occurring again.  What do You need to know about Eliquis ? The starting dose is 10 mg (two 5 mg tablets) taken TWICE daily for the FIRST SEVEN (7) DAYS, then on 10/07/17  the dose is reduced to ONE 5 mg tablet taken TWICE daily.  Eliquis may be taken with or without food.   Try to take the dose about the same time in the morning and in the evening. If you have difficulty swallowing the tablet whole please discuss with your pharmacist how to take the medication safely.  Take Eliquis exactly as prescribed and DO NOT stop taking Eliquis without talking to the doctor who prescribed the medication.  Stopping may increase your risk of developing a new blood clot.  Refill your prescription before you run out.  After discharge, you should have regular check-up appointments with your healthcare provider that is prescribing your Eliquis.    What do you do if you miss a dose? If a dose of ELIQUIS is not taken at the scheduled time, take it as soon as possible on the same day and twice-daily administration should be resumed. The dose should not be doubled to make up for a missed dose.  Important Safety Information A possible side effect of Eliquis is bleeding. You should call your healthcare provider right away if you experience any of the following: ? Bleeding from an injury or your nose that does not stop. ? Unusual colored urine (red or dark brown) or unusual colored stools (red or black). ? Unusual bruising for unknown reasons. ? A serious fall or if you hit your head (even if there is no bleeding).  Some  medicines may interact with Eliquis and might increase your risk of bleeding or clotting while on Eliquis. To help avoid this, consult your healthcare provider or pharmacist prior to using any new prescription or non-prescription medications, including herbals, vitamins, non-steroidal anti-inflammatory drugs (NSAIDs) and supplements.  This website has more information on Eliquis (apixaban): http://[email or web address].com/eliquis/home

## 2017-10-02 NOTE — Progress Notes (Signed)
Physical Therapy Treatment Patient Details Name: [name] [name] MRN: [id number] DOB: Aug 14, 1945 Today's Date: 10/02/2017    History of Present Illness Pt is a 72 y/o female admitted secondary to SOB and found to have an acute PE. Heparin drip was initiated. Of note, pt with recent meningioma resection on 09/07/17. PMH including but not limited to HLD and osteoporosis.    PT Comments    Session focused on progression of activity tolerance, functional mobility and education. Continues to be limited by elevated heart rate and fatigues easily. Lying supine at rest, BP 127/77, HR 124, SpO2 93% on 2L O2. SpO2 90% on RA at rest. Following limited ambulation in room, pt HR 143 bpm, 84% SpO2 (RN, Vonna Kotyk notified). Placed patient back on 2L O2 and increased to 97% SpO2. Reviewed endurance strategies and energy conservation at length for patient's planned discharge home; patient verbalized understanding.     Follow Up Recommendations  Home health PT;Supervision for mobility/OOB     Equipment Recommendations  None recommended by PT    Recommendations for Other Services       Precautions / Restrictions Precautions Precautions: None Precaution Comments: monitor HR, SPO2 Restrictions Weight Bearing Restrictions: No    Mobility  Bed Mobility Overal bed mobility: Modified Independent                Transfers Overall transfer level: Modified independent Equipment used: None             General transfer comment: ModI for transfers from edge of bed and [facility]  Ambulation/Gait Ambulation/Gait assistance: Supervision Gait Distance (Feet): 15 Feet Assistive device: None Gait Pattern/deviations: Step-through pattern;Narrow base of support Gait velocity: Decreased    General Gait Details: Mild unsteadiness and decreased gait speed but no overt LOB. HR increase to 143 bpm   Stairs             Wheelchair Mobility    Modified Rankin (Stroke Patients Only)       Balance  Overall balance assessment: Mild deficits observed, not formally tested                                          Cognition Arousal/Alertness: Awake/alert Behavior During Therapy: WFL for tasks assessed/performed Overall Cognitive Status: Impaired/Different from baseline Area of Impairment: Memory                     Memory: Decreased short-term memory                Exercises      General Comments        Pertinent Vitals/Pain Pain Assessment: No/denies pain    Home Living                      Prior Function            PT Goals (current goals can now be found in the care plan section) Acute Rehab PT Goals Patient Stated Goal: to return home ASAP PT Goal Formulation: With patient Time For Goal Achievement: 10/15/17 Potential to Achieve Goals: Good Progress towards PT goals: Progressing toward goals    Frequency    Min 3X/week      PT Plan Current plan remains appropriate    Co-evaluation              AM-PAC PT "6 Clicks" Daily Activity  Outcome Measure  Difficulty turning over in bed (including adjusting bedclothes, sheets and blankets)?: None Difficulty moving from lying on back to sitting on the side of the bed? : None Difficulty sitting down on and standing up from a chair with arms (e.g., wheelchair, bedside commode, etc,.)?: A Little Help needed moving to and from a bed to chair (including a wheelchair)?: None Help needed walking in hospital room?: A Little Help needed climbing 3-5 steps with a railing? : A Little 6 Click Score: 21    End of Session Equipment Utilized During Treatment: Oxygen Activity Tolerance: Patient limited by fatigue Patient left: with call bell/phone within reach;in bed Nurse Communication: Mobility status PT Visit Diagnosis: Other abnormalities of gait and mobility (R26.89)     Time: 9678-9381 PT Time Calculation (min) (ACUTE ONLY): 24 min  Charges:  $Therapeutic Activity:  8-22 mins $Self Care/Home Management: 8-22                     Ellamae Sia, PT, DPT Acute Rehabilitation Services Pager [phone] Office [phone]    [name] [name] 10/02/2017, 8:59 AM

## 2017-10-03 NOTE — Discharge Summary (Signed)
[facility] Discharge Summary   Patient: [name] [name] TKP:[id number]   PCP: Darreld Mclean, MD DOB: 1945/02/22   Date of admission: 09/28/2017   Date of discharge: 10/02/2017    Discharge Diagnoses:  Principal Problem:   Acute pulmonary embolism ([place]) Active Problems:   Hyperlipidemia   GERD   Osteoporosis   Meningioma ([place])   Dyspnea on exertion   Hypoxia   Admitted From: home Disposition:  home  Recommendations for Outpatient Follow-up:  1. Please follow up with PC in 1 week   Follow-up Information    Copland, Gay Filler, MD.   Specialty:  Family Medicine Contact information: [address] [place] [address] [phone]        Ventura Sellers, MD. Schedule an appointment as soon as possible for a visit in 1 month(s).   Specialties:  Psychiatry, Neurology, Oncology Contact information: [place] [address] [phone]        [facility].   Specialty:  Home Health Services Why:  Physical therapist will contact you to make appointment for home visit.  Contact information: [address] [place] [address] [phone]          Diet recommendation: cardiology   Activity: The patient is advised to gradually reintroduce usual activities.  Discharge Condition: good  Code Status: full code  History of present illness: As per the H and P dictated on admission, "[name] [name] is a 72 y.o. female with medical history significant of meningioma status post resection 3 weeks ago, GERD, hyperlipidemia, osteoarthritis came to the hospital with complains of shortness of breath.  Patient stated after being discharged here about 3 weeks ago, she started experiencing mild shortness of breath which continued to progress over the last 3 weeks.  Over the course of last 2 days she was not even able to make it to her bathroom without getting very dyspneic.  Due to these concerns she  went to [facility] ER for evaluation.  Patient denies any personal or family history of blood clots, long distance travel.  Resection of meningioma and craniotomy was only recent surgery. She denied any chest pain, lightheadedness, dizziness, syncope, bilateral lower extremity pain or other complaints.  In the ER patient's vital signs show tachycardia with mild hypoxia requiring 2 L nasal cannula.  Labs were unremarkable but the CTA showed acute bilateral pulmonary embolism involving all the lobes and mostly they are lobular.  Is described as upper moderate clot burden without any central embolism.  There is some right ventricular strain.  Case was discussed by the ER provider with the neurosurgeon, Dr. Saintclair Halsted who advised proceeding with anticoagulation.  Patient was started on heparin drip and transferred here for further care and management.  When I saw the patient at bedside she was quite comfortable at rest requiring 2 L nasal cannula.  Denied any other complaints."  Hospital Course:  Summary of her active problems in the hospital is as following. Dyspnea on exertion and hypoxia, requiring 2 L nasal cannula Acute bilateral lobular pulmonary embolism, moderate clot burden -needing Supplemental oxygen. -Heparin drip has been started.  Discussed with neuro oncology and recommended to transition to NOAC, patient chose Eliquis - Echocardiogram and lower extremity Dopplers shows no evidence of acute abnormality -Supplemental oxygen as necessary. -Supportive care.  Meningioma status post resection 3 weeks ago -Patient has been doing well postoperatively. Case was discussed by the ER provider with Dr.  Saintclair Halsted, okay to start anticoagulation for PE.  Hyperlipidemia - On Zetia at home.   GERD -PPI as necessary.  Osteoporosis -Pain control.  Acute on chronic diastolic CHF. Treated with IV Lasix and d/c home on oral lasix PRN  All other chronic medical condition were stable  during the hospitalization.  Patient was seen by physical therapy, who recommended home health, which was arranged by Education officer, museum and case Freight forwarder. On the day of the discharge the patient's vitals were stable , and no other acute medical condition were reported by patient. the patient was felt safe to be discharge at home with home health.  Consultants: none Procedures: Echocardiogram   DISCHARGE MEDICATION: Allergies as of 10/02/2017      Reactions   Bee Venom Swelling      Medication List    TAKE these medications   benzonatate 100 MG capsule Commonly known as:  TESSALON Take 1 capsule (100 mg total) by mouth 3 (three) times daily.   CALCIUM 600/VITAMIN D 600-400 MG-UNIT chew tablet Generic drug:  Calcium Carbonate-Vitamin D Chew 1 tablet by mouth daily.   dextromethorphan-guaiFENesin 30-600 MG 12hr tablet Commonly known as:  MUCINEX DM Take 1 tablet by mouth 2 (two) times daily.   ELIQUIS STARTER PACK 5 MG Tabs Take as directed on package: start with two-5mg  tablets twice daily for 7 days. On day 8, switch to one-5mg  tablet twice daily.   ezetimibe 10 MG tablet Commonly known as:  ZETIA Take 1 tablet (10 mg total) by mouth daily.   finasteride 1 MG tablet Commonly known as:  PROPECIA Take 1 mg by mouth daily.   furosemide 20 MG tablet Commonly known as:  LASIX Take 1 tablet (20 mg total) by mouth daily as needed.   HAIR SKIN & NAILS GUMMIES PO Take 1 tablet by mouth daily.   HYDROcodone-acetaminophen 5-325 MG tablet Commonly known as:  NORCO/VICODIN Take 1 tablet by mouth every 4 (four) hours as needed for moderate pain.      Allergies  Allergen Reactions  . Bee Venom Swelling   Discharge Instructions    Diet - low sodium heart healthy   Complete by:  As directed    Discharge instructions   Complete by:  As directed    It is important that you read following instructions as well as go over your medication list with RN to help you understand your care  after this hospitalization.  Discharge Instructions: Please follow-up with PCP in one week  Please request your primary care physician to go over all Hospital Tests and Procedure/Radiological results at the follow up,  Please get all Hospital records sent to your PCP by signing hospital release before you go home.   Do not take more than prescribed Pain, Sleep and Anxiety Medications. You were cared for by a hospitalist during your hospital stay. If you have any questions about your discharge medications or the care you received while you were in the hospital after you are discharged, you can call the unit and ask to speak with the hospitalist on call if the hospitalist that took care of you is not available.  Once you are discharged, your primary care physician will handle any further medical issues. Please note that NO REFILLS for any discharge medications will be authorized once you are discharged, as it is imperative that you return to your primary care physician (or establish a relationship with a primary care physician if you do not have one) for your aftercare needs  so that they can reassess your need for medications and monitor your lab values. You Must read complete instructions/literature along with all the possible adverse reactions/side effects for all the Medicines you take and that have been prescribed to you. Take any new Medicines after you have completely understood and accept all the possible adverse reactions/side effects. Wear Seat belts while driving. If you have smoked or chewed Tobacco in the last 2 yrs please stop smoking and/or stop any Recreational drug use.   Increase activity slowly   Complete by:  As directed      Discharge Exam: Filed Weights   09/29/17 0624 09/30/17 0312 10/02/17 0500  Weight: 70 kg 67.7 kg 66.8 kg   Vitals:   10/02/17 0000 10/02/17 0754  BP: 113/74 126/82  Pulse:  (!) 106  Resp:  18  Temp:  100.2 F (37.9 C)  SpO2:  98%   General:  Appear in mild distress, no Rash; Oral Mucosa moist. Cardiovascular: S1 and S2 Present, no Murmur, no JVD Respiratory: Bilateral Air entry present and bilateral basal Crackles, no wheezes Abdomen: Bowel Sound present, Soft and no tenderness Extremities: no Pedal edema, no calf tenderness Neurology: Grossly no focal neuro deficit.  The results of significant diagnostics from this hospitalization (including imaging, microbiology, ancillary and laboratory) are listed below for reference.    Significant Diagnostic Studies: Dg Chest 2 View  Result Date: 10/01/2017 CLINICAL DATA:  Shortness of breath for 1 month EXAM: CHEST - 2 VIEW COMPARISON:  Chest radiograph and chest CT September 28, 2017 FINDINGS: There are scattered areas of scarring in both mid and lower lung zones. There is atelectatic change in the lung bases as well. There is no frank edema or consolidation. The heart size and pulmonary vascularity are normal. No adenopathy. There is lower thoracic levoscoliosis with upper lumbar dextroscoliosis. There are evident breast implants bilaterally. No evident bone lesions. IMPRESSION: Areas of scarring and atelectatic change bilaterally. No frank edema or consolidation. Stable cardiac silhouette. No evident adenopathy. Electronically Signed   By: Lowella Grip III M.D.   On: 10/01/2017 10:59   Dg Chest 2 View  Result Date: 09/28/2017 CLINICAL DATA:  Shortness of breath and wheezing since last night EXAM: CHEST - 2 VIEW COMPARISON:  March 31, 2017 FINDINGS: The heart size and mediastinal contours are within normal limits. Both lungs are clear. There is scoliosis of spine. IMPRESSION: No active cardiopulmonary disease. Electronically Signed   By: Abelardo Diesel M.D.   On: 09/28/2017 11:03   Ct Angio Chest Pe W And/or Wo Contrast  Result Date: 09/28/2017 CLINICAL DATA:  Shortness of breath and cough for 2 weeks. Intracranial surgery in September 2019. EXAM: CT ANGIOGRAPHY CHEST WITH CONTRAST  TECHNIQUE: Multidetector CT imaging of the chest was performed using the standard protocol during bolus administration of intravenous contrast. Multiplanar CT image reconstructions and MIPs were obtained to evaluate the vascular anatomy. CONTRAST:  179mL ISOVUE-370 IOPAMIDOL (ISOVUE-370) INJECTION 76% COMPARISON:  Chest radiograph 09/28/2017 FINDINGS: Cardiovascular: Acute bilateral pulmonary embolus is present including right middle lobar pulmonary arterial involvement and segmental involvement in the right upper lobe, right lower lobe, left upper lobe, and left lower lobe. Although there is no central pulmonary embolus within the main pulmonary artery or right and left pulmonary artery, clot burden is still moderate to high given the widespread distribution of emboli. Right ventricular to left ventricular ratio is 0.86. Mediastinum/Nodes: Right paratracheal node 0.6 cm in short axis on image 29/4. Lower right paratracheal node adjacent  to the right mainstem bronchus 0.9 cm in short axis on image 37/4. No overtly pathologic thoracic adenopathy. Lungs/Pleura: Mosaic attenuation in both lungs, favoring mild edema. There is mild atelectasis in the posterior basal segment right lower lobe. Upper Abdomen: Fluid density lesion anteriorly in the lateral segment left hepatic lobe on image 86/4 is only partially included, but statistically likely to be a cyst or similar benign lesion. Musculoskeletal: Bilateral breast implants are in place. No significant skeletal lesions are identified. Review of the MIP images confirms the above findings. IMPRESSION: 1. Acute bilateral pulmonary embolus involving all lobes of the lung. There is lobar involvement of the right middle lobar pulmonary artery and scattered segmental involvement in various segments of both upper lobes and both lower lobes. Overall upper moderate clot burden, without a large central embolus in the right or left pulmonary artery, but with a considerable amount of  scattered smaller emboli bilaterally. Right ventricular to right left ventricular ratio 0.86 which is below the typical abnormal threshold value of 0.9. 2. Mild mosaic attenuation in both lungs favoring mild edema. 3. Hypodense lesion anteriorly in the lateral segment left hepatic lobe favoring a cyst. Critical Value/emergent results were called by telephone at the time of interpretation on 09/28/2017 at 12:27 pm to Dr. Jola Schmidt , who verbally acknowledged these results. Electronically Signed   By: Van Clines M.D.   On: 09/28/2017 12:30   Mr Jeri Cos YQ Contrast  Result Date: 09/08/2017 CLINICAL DATA:  72 year old female postoperative day 1 bi-coronal craniotomy for resection of left frontal parasagittal meningioma, resection of the anterior 3rd of the superior sagittal sinus, duraplasty. 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 EXAM: MRI HEAD WITHOUT AND WITH CONTRAST TECHNIQUE: Multiplanar, multiecho pulse sequences of the brain and surrounding structures were obtained without and with intravenous contrast. CONTRAST:  62mL MULTIHANCE GADOBENATE DIMEGLUMINE 529 MG/ML IV SOLN COMPARISON:  Preoperative brain MRIs 09/04/2017 and earlier. FINDINGS: Brain: Unchanged 3.4 centimeter lobular and infiltrative homogeneously enhancing mass in the cavernous sinus and sella, eccentric to the left only the a right far lateral cavernous sinus appears spared. Sequelae of anterior craniotomy with mild pneumocephalus and extra-axial fluid along the anterior frontal lobes. Left anterior frontal resection site is demonstrated with mild regional superficial brain restricted diffusion (series 3, image 33) and regional blood products. Stable regional T2 and FLAIR hyperintensity resembling vasogenic edema in the anterior half of the left hemisphere. However, following contrast no residual enhancing tumor at this site is identified. There is smooth postoperative appearing dural thickening over both  superior convexities (series 11, image 15). Note is made of slightly irregular increased enhancement along the residual anterior falx and corresponding to the superior sagittal sinus resection margin on series 11, image 17, and also a tiny nodular focus of enhancement seen along the resection margin on series 11, image 21. There is also a small focus of restricted diffusion in the right cingulate gyrus on series 3, image 36. No other diffusion restriction or acute infarct. Largely stable intracranial mass effect including on the frontal horns. Stable ventricle size and configuration. Basilar cisterns remain patent. Negative cervicomedullary junction. Vascular: Major intracranial vascular flow voids are stable. Mildly narrow it appearance of the cavernous left ICA siphon is stable. The residual superior sagittal sinus and other major dural venous sinuses are enhancing and appear patent. Skull and upper cervical spine: Anterior craniotomy. Underlying bone marrow signal is stable including a small indeterminate area of nodular enhancement at the right parietal bone (  series 10, image 42) which is probably benign. Negative visible cervical spine and spinal cord. Sinuses/Orbits: Stable abnormal left orbital apex in association with the cavernous sinus mass. The remaining orbits soft tissues are stable and negative. Paranasal sinuses are stable in clear. Other: Mastoid air cells remain clear. Visible internal auditory structures appear normal. Postoperative changes to the scalp. IMPRESSION: 1. Gross total resection of the anterior left frontal meningioma. Trace operative ischemia along the resection cavity margins, and a small contralateral ischemic focus in the right cingulate gyrus. Attention directed on follow-up restaging studies to several areas of mild enhancement as seen on series 11. 2. Stable cerebral edema and intracranial mass effect at this time. 3. Stable lobulated 3.4 cm cavernous sinus, intra sellar, and  left orbital apex meningioma. Electronically Signed   By: Genevie Ann M.D.   On: 09/08/2017 12:32   Mr Jeri Cos MG Contrast  Result Date: 09/04/2017 CLINICAL DATA:  Meningioma follow-up EXAM: MRI HEAD WITHOUT AND WITH CONTRAST MRV HEAD WITHOUT CONTRAST TECHNIQUE: Multiplanar, multiecho pulse sequences of the brain and surrounding structures were obtained without and with intravenous contrast. Angiographic images of the intracranial venous structures were obtained using MRV technique without intravenous contrast. CONTRAST:  30mL MULTIHANCE GADOBENATE DIMEGLUMINE 529 MG/ML IV SOLN COMPARISON:  Brain MRI 08/22/2017 FINDINGS: Brain MRI: Avidly enhancing, partially calcified mass arising from the left aspect of the falx and measuring up to 6 x 5 x 5 cm on axial slices. A small component extends through the falx is seen right parasagittal. There is still extensive edema within the adjacent brain and local mass effect with midline shift and ventricular distortion. The mass grows into the frontal bone and into the adjacent superior sagittal sinus, see series 10 at the level of the sinuses. There is reconstitution of flow void posterior to the mass and anterior to the mass. Known left cavernous sinus, sella, Meckel's cave, and anterior clinoid meningioma extending into the optic foramen and superior orbital fissure. This mass encompasses the left carotid along the extent of the siphon and supraclinoid segment, without apparent vessel narrowing. The irregular shape of the mass defies reproducible measurement. MRV: Confirmed segment of absent flow within the superior sagittal sinus at the level of the mass. The other dural venous sinuses are widely patent. Non-contrast MRV technique is of limited utility in evaluating the cavernous sinuses. IMPRESSION: 1. 6 x5 cm left parafalcine meningioma with small right parafalcine component. The superior sagittal sinus is thrombosed at the center of the mass where there is tumor invading  the sinus and frontal bone. See coronal T2 weighted imaging. 2. Advanced vasogenic edema in the left frontal lobe without interval improvement. 3. Unchanged meningioma filling the left cavernous sinus, sella, Meckel's cave and infiltrating the left optic foramen and superior orbital fissure. Electronically Signed   By: Monte Fantasia M.D.   On: 09/04/2017 15:42   Mr Mrv Head Wo Cm  Result Date: 09/04/2017 CLINICAL DATA:  Meningioma follow-up EXAM: MRI HEAD WITHOUT AND WITH CONTRAST MRV HEAD WITHOUT CONTRAST TECHNIQUE: Multiplanar, multiecho pulse sequences of the brain and surrounding structures were obtained without and with intravenous contrast. Angiographic images of the intracranial venous structures were obtained using MRV technique without intravenous contrast. CONTRAST:  53mL MULTIHANCE GADOBENATE DIMEGLUMINE 529 MG/ML IV SOLN COMPARISON:  Brain MRI 08/22/2017 FINDINGS: Brain MRI: Avidly enhancing, partially calcified mass arising from the left aspect of the falx and measuring up to 6 x 5 x 5 cm on axial slices. A small component extends  through the falx is seen right parasagittal. There is still extensive edema within the adjacent brain and local mass effect with midline shift and ventricular distortion. The mass grows into the frontal bone and into the adjacent superior sagittal sinus, see series 10 at the level of the sinuses. There is reconstitution of flow void posterior to the mass and anterior to the mass. Known left cavernous sinus, sella, Meckel's cave, and anterior clinoid meningioma extending into the optic foramen and superior orbital fissure. This mass encompasses the left carotid along the extent of the siphon and supraclinoid segment, without apparent vessel narrowing. The irregular shape of the mass defies reproducible measurement. MRV: Confirmed segment of absent flow within the superior sagittal sinus at the level of the mass. The other dural venous sinuses are widely patent.  Non-contrast MRV technique is of limited utility in evaluating the cavernous sinuses. IMPRESSION: 1. 6 x5 cm left parafalcine meningioma with small right parafalcine component. The superior sagittal sinus is thrombosed at the center of the mass where there is tumor invading the sinus and frontal bone. See coronal T2 weighted imaging. 2. Advanced vasogenic edema in the left frontal lobe without interval improvement. 3. Unchanged meningioma filling the left cavernous sinus, sella, Meckel's cave and infiltrating the left optic foramen and superior orbital fissure. Electronically Signed   By: Monte Fantasia M.D.   On: 09/04/2017 15:42    Microbiology: Recent Results (from the past 240 hour(s))  MRSA PCR Screening     Status: None   Collection Time: 09/28/17  4:12 PM  Result Value Ref Range Status   MRSA by PCR NEGATIVE NEGATIVE Final    Comment:        The GeneXpert MRSA Assay (FDA approved for NASAL specimens only), is one component of a comprehensive MRSA colonization surveillance program. It is not intended to diagnose MRSA infection nor to guide or monitor treatment for MRSA infections. Performed at [facility], [place] [address], [place], [place] [address]      Labs: CBC: Recent Labs  Lab 09/28/17 1025 09/28/17 1028 09/29/17 0639 09/30/17 0611 10/01/17 0108  WBC 8.8  --  8.0 8.4 8.1  NEUTROABS 7.5*  --   --   --   --   HGB 11.5* 11.6* 10.3* 10.0* 10.4*  HCT 35.8 34.0* 32.2* 30.6* 32.7*  MCV 99.7  --  99.7 97.5 98.5  PLT 158  --  154 148* 009   Basic Metabolic Panel: Recent Labs  Lab 09/28/17 1025 09/28/17 1028 09/29/17 0639  NA 142 138 140  K 4.3 4.2 4.0  CL 103 103 106  CO2 27  --  26  GLUCOSE 117* 114* 89  BUN 19 21 5*  CREATININE 0.90 0.90 0.75  CALCIUM 9.0  --  8.4*   Liver Function Tests: Recent Labs  Lab 09/29/17 0639  AST 19  ALT 19  ALKPHOS 48  BILITOT 0.5  PROT 5.5*  ALBUMIN 2.5*   No results for input(s): LIPASE, AMYLASE in the last 168  hours. No results for input(s): AMMONIA in the last 168 hours. Cardiac Enzymes: Recent Labs  Lab 09/28/17 1025  TROPONINI <0.03   BNP (last 3 results) No results for input(s): BNP in the last 8760 hours. CBG: Recent Labs  Lab 09/29/17 2137 09/30/17 0804 09/30/17 1218 10/01/17 0815 10/01/17 1752  GLUCAP 102* 102* 104* 101* 134*   Time spent: 35 minutes  Signed:  Berle Mull  [facility] 10/02/2017 , 1:58 PM

## 2017-10-03 NOTE — Progress Notes (Signed)
[facility] at [address], [place], [place] [address] [phone]  Date:  10/05/2017   Name:  [name] [name]   DOB:  May 30, 1945   MRN:  [id number]  PCP:  Darreld Mclean, MD    Chief Complaint: Hospitalization Follow-up (meningioma, PE, 09/28/17, doing somewhat better, shortness of breath, 2L oxygen 24 hours, 101 fever-tylenol ) and Immunizations (does she need pneumonia vaccine? given flu shot at hospital )   History of Present Illness:  [name] [name] is a 72 y.o. very pleasant female patient who presents with the following:  Pt was admitted with a recently dx brain tumor in September of this year: Admit date: 09/07/2017 Discharge date: 09/10/2017 Admission Diagnoses: Parasagittal meningioma past Discharge Diagnoses: Same Active Problems:   Meningioma [facility]) Hospital Course: Patient was admitted to the hospital underwent bicoronal craniotomy for excision of parasagittal meningioma.  Postop the patient did very well went to the recovery room in the ICU maintained in the ICU for 48 hours and was transferred to the floor.  She progressed well on a tapering dose of Decadron postop MRI scan showed good excision of convexity meningioma.  Patient will be discharged on Decadron taper with scheduled follow-up in 1 to 2 weeks.  She then presented back to the ER on 9/25 with SOB and was dx with a PE, admitted form the 25th to the 29th.  Dyspnea on exertion and hypoxia, requiring 2 L nasal cannula Acute bilateral lobular pulmonary embolism, moderate clot burden -Admit the patient to the stepdown unit for closer monitoring.  Supplemental oxygen. -Heparin drip has been started, then transition to oral anticoagulation.  Likely NOAC -discussed with her risk and benefit of Coumadin versus NOAC - Echocardiogram and lower extremity Dopplers ordered -Supplemental oxygen as necessary. -Supportive care. Meningioma status post resection 3  weeks ago -Patient has been doing well postoperatively.  Case was discussed by the ER provider with Dr. Saintclair Halsted today, okay to start anticoagulation for PE. Hyperlipidemia - On Zetia at home.  Currently med rec status is pending therefore have not resumed it yet.  She notes that she came through the brain surgery ok, but the blood clot really threw her as she cannot breathe well still She is having back pain now as well- this went away for a little while while she was on the steroids for her brain tumor.  Her pain is located across her mid back, bilaterally.  Seems to be muscular.  Feels fine when she is supine, does not radiate to her legs.  She notes lower back pain over the years due to scoliosis, but this seems different   She was D/C to home on 9/29 on Eliquis She is not sure if her breathing is getting any better yet but not worse  She is on oxygen 24/7 right now which is really annoying  She can walk about 10 feet before she is SOB-  She will remove her oxygen to take a shower and does ok taking it off for that amt of time  They will get a sat meter to keep track of how she is doing at home   She also has noted a low grade fever- up to 100 or 101 the last couple of days.  No fever last night She has used tylenol which helps with her pain Some cough but not severe  Accompanied by her long term fiance who helps with the history today  Patient Active  Problem List   Diagnosis Date Noted  . Acute pulmonary embolism ([place]) 09/28/2017  . Dyspnea on exertion 09/28/2017  . Hypoxia 09/28/2017  . Meningioma ([place]) 09/07/2017  . FATIGUE 09/12/2009  . MYALGIA 10/31/2008  . DYSPNEA ON EXERTION 10/31/2008  . Hyperlipidemia 11/11/2006  . OTHER SPECIFIED ANEMIAS 11/11/2006  . GERD 11/11/2006  . Osteoporosis 11/11/2006  . IRRITABLE BOWEL SYNDROME, HX OF 10/20/2006    Past Medical History:  Diagnosis Date  . Cancer ([place])    skin cancer   . Hyperlipidemia   . Osteoporosis   . PONV  (postoperative nausea and vomiting)    with breast surgery    Past Surgical History:  Procedure Laterality Date  . APPLICATION OF CRANIAL NAVIGATION N/A 09/07/2017   Procedure: APPLICATION OF CRANIAL NAVIGATION;  Surgeon: Kary Kos, MD;  Location: [facility];  Service: Neurosurgery;  Laterality: N/A;  . BREAST ENHANCEMENT SURGERY    . BUNIONECTOMY    . COLONOSCOPY    . CRANIOTOMY N/A 09/07/2017   Procedure: CRAINIOTOMY BI CORONAL w/BRAINLAB;  Surgeon: Kary Kos, MD;  Location: [facility];  Service: Neurosurgery;  Laterality: N/A;  . EYE SURGERY Bilateral    cataract surgery  . SKIN CANCER EXCISION    . WISDOM TOOTH EXTRACTION      Social History   Tobacco Use  . Smoking status: Never Smoker  . Smokeless tobacco: Never Used  Substance Use Topics  . Alcohol use: No  . Drug use: No    Family History  Problem Relation Age of Onset  . Breast cancer Mother   . Heart disease Father     Allergies  Allergen Reactions  . Bee Venom Swelling    Medication list has been reviewed and updated.  Current Outpatient Medications on File Prior to Visit  Medication Sig Dispense Refill  . benzonatate (TESSALON) 100 MG capsule Take 1 capsule (100 mg total) by mouth 3 (three) times daily. 20 capsule 0  . Biotin w/ Vitamins C & E (HAIR SKIN & NAILS GUMMIES PO) Take 1 tablet by mouth daily.    . Calcium Carbonate-Vitamin D (CALCIUM 600/VITAMIN D) 600-400 MG-UNIT chew tablet Chew 1 tablet by mouth daily.    Marland Kitchen dextromethorphan-guaiFENesin (MUCINEX DM) 30-600 MG 12hr tablet Take 1 tablet by mouth 2 (two) times daily. 30 tablet 0  . ELIQUIS STARTER PACK (ELIQUIS STARTER PACK) 5 MG TABS Take as directed on package: start with two-5mg  tablets twice daily for 7 days. On day 8, switch to one-5mg  tablet twice daily. 1 each 0  . ezetimibe (ZETIA) 10 MG tablet Take 1 tablet (10 mg total) by mouth daily. 90 tablet 3  . finasteride (PROPECIA) 1 MG tablet Take 1 mg by mouth daily.   11  . furosemide (LASIX) 20 MG  tablet Take 1 tablet (20 mg total) by mouth daily as needed. 30 tablet 0  . HYDROcodone-acetaminophen (NORCO/VICODIN) 5-325 MG tablet Take 1 tablet by mouth every 4 (four) hours as needed for moderate pain. (Patient not taking: Reported on 09/28/2017) 30 tablet 0   No current facility-administered medications on file prior to visit.     Review of Systems:  As per HPI- otherwise negative. No vomiting or diarrhea   Physical Examination: Vitals:   10/05/17 1131  BP: 126/80  Pulse: (!) 105  Resp: 20  Temp: 98.1 F (36.7 C)  SpO2: 96%   Vitals:   10/05/17 1131  Weight: 149 lb (67.6 kg)  Height: 5\' 2"  (1.575 m)   Body mass  index is 27.25 kg/m. Ideal Body Weight: Weight in (lb) to have BMI = 25: 136.4  GEN: WDWN, NAD, Non-toxic, A & O x 3, looks well, hair cut short, using portable oxygen concentrator HEENT: Atraumatic, Normocephalic. Neck supple. No masses, No LAD. Ears and Nose: No external deformity. CV: RRR, No M/G/R. No JVD. No thrill. No extra heart sounds. PULM: CTA B, no wheezes, crackles, rhonchi. No retractions. No resp. distress. No accessory muscle use. EXTR: No c/c/e NEURO Normal gait.  PSYCH: Normally interactive. Conversant. Not depressed or anxious appearing.  Calm demeanor.  Well healed scar across the midline of her skull She notes tenderness in the muscles of her bilateral mid back, and they are tight/ in spasm Normal SLR, strength and DTR of her bilateral legs   Received her cxr:  FINDINGS: Streaky opacities in the bases most consistent with atelectasis. No suspicious infiltrate. The heart, hila, mediastinum, lungs, and pleura are otherwise unremarkable. IMPRESSION: Streaky opacities in the bases, most consistent with atelectasis.  Assessment and Plan: Meningioma ([place])  Acute pulmonary embolism without acute cor pulmonale, unspecified pulmonary embolism type ([place]) - Plan: DG Chest 2 View, Ambulatory referral to Pulmonology  Fever, unspecified -  Plan: doxycycline (VIBRAMYCIN) 100 MG capsule  Strain of mid-back, initial encounter  Following up from 2 recent admissions - for brain tumor removal and then for subsequent PE She is on eliquis and oxygen She noted low grade fevers the last couple of days- will start her on doxycycline for possible developing pneumonia.  Obtain CxR today Referral to pulmonology  Good to see you again today!  I am glad that your surgery is behind you now! -referral to pulmonology to discuss your clot and help [place] decide when to stop oxygen - for now plan to continue on eliquis for 3-6 months  -get an inexpensive oxygen saturation meter for home. We need to keep you at least 94% I am concerned that your recent low grade fevers may indicate a developing bronchitis or pneumonia Please get a chest x-ray today Start on doxycycline for 10 days- antibiotic for your lungs Try tylenol or some of the pain medication you have at home if needed for your lower back pain Please see me in 2 months to check in   Called pt to go over her CXR report- - LMOM No pneumonia noted Please let me know if not getting better   Signed Lamar Blinks, MD

## 2017-10-04 ENCOUNTER — Telehealth: Payer: Self-pay

## 2017-10-04 NOTE — Telephone Encounter (Signed)
TCM call made to patient to schedule Hospital follow up appointment. Patient has scheduled for 10/05/17.

## 2017-10-05 ENCOUNTER — Ambulatory Visit: Payer: Medicare Other | Admitting: Family Medicine

## 2017-10-05 ENCOUNTER — Ambulatory Visit (HOSPITAL_BASED_OUTPATIENT_CLINIC_OR_DEPARTMENT_OTHER)
Admission: RE | Admit: 2017-10-05 | Discharge: 2017-10-05 | Disposition: A | Discharge status: Home / Self Care | Payer: Medicare Other | Source: Ambulatory Visit | Attending: Family Medicine | Admitting: Family Medicine

## 2017-10-05 ENCOUNTER — Encounter: Payer: Self-pay | Admitting: Family Medicine

## 2017-10-05 ENCOUNTER — Telehealth: Payer: Self-pay | Admitting: *Deleted

## 2017-10-05 VITALS — BP 126/80 | HR 105 | Temp 98.1°F | Resp 20 | Ht 62.0 in | Wt 149.0 lb

## 2017-10-05 DIAGNOSIS — I2699 Other pulmonary embolism without acute cor pulmonale: Secondary | ICD-10-CM | POA: Diagnosis not present

## 2017-10-05 DIAGNOSIS — D329 Benign neoplasm of meninges, unspecified: Secondary | ICD-10-CM

## 2017-10-05 DIAGNOSIS — R05 Cough: Secondary | ICD-10-CM | POA: Diagnosis not present

## 2017-10-05 DIAGNOSIS — S29012A Strain of muscle and tendon of back wall of thorax, initial encounter: Secondary | ICD-10-CM | POA: Diagnosis not present

## 2017-10-05 DIAGNOSIS — R918 Other nonspecific abnormal finding of lung field: Secondary | ICD-10-CM | POA: Insufficient documentation

## 2017-10-05 DIAGNOSIS — R509 Fever, unspecified: Secondary | ICD-10-CM | POA: Diagnosis not present

## 2017-10-05 DIAGNOSIS — R0602 Shortness of breath: Secondary | ICD-10-CM | POA: Diagnosis not present

## 2017-10-05 MED ORDER — DOXYCYCLINE HYCLATE 100 MG PO CAPS: 100.0000 mg | ORAL_CAPSULE | Freq: Two times a day (BID) | ORAL | 0 refills | Status: DC

## 2017-10-05 NOTE — Patient Instructions (Signed)
Good to see you again today!  I am glad that your surgery is behind you now! -referral to pulmonology to discuss your clot and help [place] decide when to stop oxygen - for now plan to continue on eliquis for 3-6 months  -get an inexpensive oxygen saturation meter for home. We need to keep you at least 94% I am concerned that your recent low grade fevers may indicate a developing bronchitis or pneumonia Please get a chest x-ray today Start on doxycycline for 10 days- antibiotic for your lungs Try tylenol or some of the pain medication you have at home if needed for your lower back pain Please see me in 2 months to check in

## 2017-10-05 NOTE — Telephone Encounter (Signed)
Received Physician Orders from [facility]; forwarded to provider/SLS 10/02

## 2017-10-19 ENCOUNTER — Telehealth: Payer: Self-pay | Admitting: Internal Medicine

## 2017-10-19 ENCOUNTER — Inpatient Hospital Stay: Payer: Medicare Other | Attending: Internal Medicine | Admitting: Internal Medicine

## 2017-10-19 VITALS — BP 132/81 | HR 89 | Temp 98.4°F | Resp 18 | Ht 62.0 in | Wt 151.9 lb

## 2017-10-19 DIAGNOSIS — D320 Benign neoplasm of cerebral meninges: Secondary | ICD-10-CM | POA: Diagnosis not present

## 2017-10-19 DIAGNOSIS — Z7901 Long term (current) use of anticoagulants: Secondary | ICD-10-CM | POA: Diagnosis not present

## 2017-10-19 DIAGNOSIS — I2699 Other pulmonary embolism without acute cor pulmonale: Secondary | ICD-10-CM

## 2017-10-19 DIAGNOSIS — D329 Benign neoplasm of meninges, unspecified: Secondary | ICD-10-CM

## 2017-10-19 NOTE — Telephone Encounter (Signed)
Scheduled appt per 10/16 los - gave patient aVS and calender per los.   

## 2017-10-19 NOTE — Progress Notes (Signed)
[facility] at [place], [place] [address] [phone]   New Patient Evaluation  Date of Service: 10/19/17 Patient Name: [name] [name] Patient MRN: [id number] Patient DOB: 10-Dec-1945 Provider: Ventura Sellers, MD  Identifying Statement:  [name] [name] is a 72 y.o. female with left frontal meningioma who presents for initial consultation and evaluation.    Referring Provider: Darreld Mclean, [address] [place],  [id number]  Oncologic History: 09/07/17: Craniotomy, left frontal resection with Dr. Saintclair Halsted.  Path is WHO grade I meningioma  History of Present Illness: The patient's records from the referring physician were obtained and reviewed and the patient interviewed to confirm this HPI.  [name] [name] presents after recent resection of left frontal meningioma.  She presented to medical attention initially in August after complaints of loss of short term memory, depressed affect, and new onset headaches.  MRI demonstrated 2 lesions c/w meningioma, one in the left frontal lobe and another tumor in the left cavernous sinus and orbital apex.  She denied visual symptoms including double vision.  Dr. Saintclair Halsted resected the left frontal tumor on 09/07/17, and following surgery she described clinical improvement.  Her memory is still "not 100%" but she feels more like her "normal self".  Patient and husband deny any behavioral sytmpoms or seizures.  Headaches have resolved.  Unfortunately 2-3 weeks after surgery she developed shortness of breath and was found to have a pulmonary embolism.  She was started on Eliquis and has been taking PRN oxygen, although respiratory symptoms have improved dramatically.    Medications: Current Outpatient Medications on File Prior to Visit  Medication Sig Dispense Refill  . benzonatate (TESSALON) 100 MG capsule Take 1 capsule (100 mg total) by mouth 3 (three) times daily. 20 capsule 0    . Biotin w/ Vitamins C & E (HAIR SKIN & NAILS GUMMIES PO) Take 1 tablet by mouth daily.    . Calcium Carbonate-Vitamin D (CALCIUM 600/VITAMIN D) 600-400 MG-UNIT chew tablet Chew 1 tablet by mouth daily.    Marland Kitchen dextromethorphan-guaiFENesin (MUCINEX DM) 30-600 MG 12hr tablet Take 1 tablet by mouth 2 (two) times daily. 30 tablet 0  . doxycycline (VIBRAMYCIN) 100 MG capsule Take 1 capsule (100 mg total) by mouth 2 (two) times daily. 20 capsule 0  . ELIQUIS STARTER PACK (ELIQUIS STARTER PACK) 5 MG TABS Take as directed on package: start with two-5mg  tablets twice daily for 7 days. On day 8, switch to one-5mg  tablet twice daily. 1 each 0  . ezetimibe (ZETIA) 10 MG tablet Take 1 tablet (10 mg total) by mouth daily. 90 tablet 3  . furosemide (LASIX) 20 MG tablet Take 1 tablet (20 mg total) by mouth daily as needed. 30 tablet 0  . HYDROcodone-acetaminophen (NORCO/VICODIN) 5-325 MG tablet Take 1 tablet by mouth every 4 (four) hours as needed for moderate pain. (Patient not taking: Reported on 09/28/2017) 30 tablet 0   No current facility-administered medications on file prior to visit.     Allergies:  Allergies  Allergen Reactions  . Bee Venom Swelling   Past Medical History:  Past Medical History:  Diagnosis Date  . Cancer ([place])    skin cancer   . Hyperlipidemia   . Osteoporosis   . PONV (postoperative nausea and vomiting)    with breast surgery   Past Surgical History:  Past Surgical History:  Procedure Laterality Date  . APPLICATION OF CRANIAL NAVIGATION N/A 09/07/2017  Procedure: APPLICATION OF CRANIAL NAVIGATION;  Surgeon: Kary Kos, MD;  Location: [facility];  Service: Neurosurgery;  Laterality: N/A;  . BREAST ENHANCEMENT SURGERY    . BUNIONECTOMY    . COLONOSCOPY    . CRANIOTOMY N/A 09/07/2017   Procedure: CRAINIOTOMY BI CORONAL w/BRAINLAB;  Surgeon: Kary Kos, MD;  Location: [facility];  Service: Neurosurgery;  Laterality: N/A;  . EYE SURGERY Bilateral    cataract surgery  . SKIN CANCER  EXCISION    . WISDOM TOOTH EXTRACTION     Social History:  Social History   Socioeconomic History  . Marital status: Single    Spouse name: Not on file  . Number of children: Not on file  . Years of education: Not on file  . Highest education level: Not on file  Occupational History  . Not on file  Social Needs  . Financial resource strain: Not on file  . Food insecurity:    Worry: Not on file    Inability: Not on file  . Transportation needs:    Medical: Not on file    Non-medical: Not on file  Tobacco Use  . Smoking status: Never Smoker  . Smokeless tobacco: Never Used  Substance and Sexual Activity  . Alcohol use: No  . Drug use: No  . Sexual activity: Not on file  Lifestyle  . Physical activity:    Days per week: Not on file    Minutes per session: Not on file  . Stress: Not on file  Relationships  . Social connections:    Talks on phone: Not on file    Gets together: Not on file    Attends religious service: Not on file    Active member of club or organization: Not on file    Attends meetings of clubs or organizations: Not on file    Relationship status: Not on file  . Intimate partner violence:    Fear of current or ex partner: Not on file    Emotionally abused: Not on file    Physically abused: Not on file    Forced sexual activity: Not on file  Other Topics Concern  . Not on file  Social History Narrative   Lives home with home.  Not working.  Education:  HS grad.  [name] Fiance.     Family History:  Family History  Problem Relation Age of Onset  . Breast cancer Mother   . Heart disease Father     Review of Systems: Constitutional: Denies fevers, chills or abnormal weight loss Eyes: Denies blurriness of vision Ears, nose, mouth, throat, and face: Denies mucositis or sore throat Respiratory: +cough, dyspnea Cardiovascular: Denies palpitation, chest discomfort or lower extremity swelling Gastrointestinal:  Denies nausea, constipation,  diarrhea GU: Denies dysuria or incontinence Skin: Denies abnormal skin rashes Neurological: Per HPI Musculoskeletal: Denies joint pain, back or neck discomfort. No decrease in ROM Behavioral/Psych: Denies anxiety, disturbance in thought content, and mood instability  Physical Exam: Vitals:   10/19/17 1106  BP: 132/81  Pulse: 89  Resp: 18  Temp: 98.4 F (36.9 C)  SpO2: 97%   KPS: 90. General: Alert, cooperative, pleasant, in no acute distress Head: Craniotomy scar noted, dry and intact. EENT: No conjunctival injection or scleral icterus. Oral mucosa moist Lungs: Resp effort normal Cardiac: Regular rate and rhythm Abdomen: Soft, non-distended abdomen Skin: No rashes cyanosis or petechiae. Extremities: No clubbing or edema  Neurologic Exam: Mental Status: Awake, alert, attentive to examiner. Oriented to self and  environment. Language is fluent with intact comprehension.  Cranial Nerves: Visual acuity is grossly normal. Visual fields are full. Extra-ocular movements intact. No ptosis. Face is symmetric, tongue midline. Motor: Tone and bulk are normal. Power is full in both arms and legs. Reflexes are symmetric, no pathologic reflexes present. Intact finger to nose bilaterally Sensory: Intact to light touch and temperature Gait: Normal and tandem gait is normal.   Labs: I have reviewed the data as listed    Component Value Date/Time   NA 140 09/29/2017 0639   NA 141 07/15/2015   NA 141 07/15/2015   K 4.0 09/29/2017 0639   CL 106 09/29/2017 0639   CO2 26 09/29/2017 0639   GLUCOSE 89 09/29/2017 0639   BUN 5 (L) 09/29/2017 0639   BUN 22 (A) 07/15/2015   BUN 22 (A) 07/15/2015   CREATININE 0.75 09/29/2017 0639   CALCIUM 8.4 (L) 09/29/2017 0639   PROT 5.5 (L) 09/29/2017 0639   ALBUMIN 2.5 (L) 09/29/2017 0639   AST 19 09/29/2017 0639   ALT 19 09/29/2017 0639   ALKPHOS 48 09/29/2017 0639   BILITOT 0.5 09/29/2017 0639   GFRNONAA >60 09/29/2017 0639   GFRAA >60 09/29/2017  0639   Lab Results  Component Value Date   WBC 8.1 10/01/2017   NEUTROABS 7.5 (H) 09/28/2017   HGB 10.4 (L) 10/01/2017   HCT 32.7 (L) 10/01/2017   MCV 98.5 10/01/2017   PLT 172 10/01/2017    Imaging: CLINICAL DATA:  72 year old female postoperative day 1 bi-coronal craniotomy for resection of left frontal parasagittal meningioma, resection of the anterior 3rd of the superior sagittal sinus, duraplasty.  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  EXAM: MRI HEAD WITHOUT AND WITH CONTRAST  TECHNIQUE: Multiplanar, multiecho pulse sequences of the brain and surrounding structures were obtained without and with intravenous contrast.  CONTRAST:  49mL MULTIHANCE GADOBENATE DIMEGLUMINE 529 MG/ML IV SOLN  COMPARISON:  Preoperative brain MRIs 09/04/2017 and earlier.  FINDINGS: Brain: Unchanged 3.4 centimeter lobular and infiltrative homogeneously enhancing mass in the cavernous sinus and sella, eccentric to the left only the a right far lateral cavernous sinus appears spared.  Sequelae of anterior craniotomy with mild pneumocephalus and extra-axial fluid along the anterior frontal lobes. Left anterior frontal resection site is demonstrated with mild regional superficial brain restricted diffusion (series 3, image 33) and regional blood products. Stable regional T2 and FLAIR hyperintensity resembling vasogenic edema in the anterior half of the left hemisphere. However, following contrast no residual enhancing tumor at this site is identified. There is smooth postoperative appearing dural thickening over both superior convexities (series 11, image 15). Note is made of slightly irregular increased enhancement along the residual anterior falx and corresponding to the superior sagittal sinus resection margin on series 11, image 17, and also a tiny nodular focus of enhancement seen along the resection margin on series 11, image 21.  There is also a  small focus of restricted diffusion in the right cingulate gyrus on series 3, image 36. No other diffusion restriction or acute infarct.  Largely stable intracranial mass effect including on the frontal horns. Stable ventricle size and configuration. Basilar cisterns remain patent. Negative cervicomedullary junction.  Vascular: Major intracranial vascular flow voids are stable. Mildly narrow it appearance of the cavernous left ICA siphon is stable. The residual superior sagittal sinus and other major dural venous sinuses are enhancing and appear patent.  Skull and upper cervical spine: Anterior craniotomy. Underlying bone marrow signal is stable including  a small indeterminate area of nodular enhancement at the right parietal bone (series 10, image 42) which is probably benign. Negative visible cervical spine and spinal cord.  Sinuses/Orbits: Stable abnormal left orbital apex in association with the cavernous sinus mass. The remaining orbits soft tissues are stable and negative. Paranasal sinuses are stable in clear.  Other: Mastoid air cells remain clear. Visible internal auditory structures appear normal. Postoperative changes to the scalp.  IMPRESSION: 1. Gross total resection of the anterior left frontal meningioma. Trace operative ischemia along the resection cavity margins, and a small contralateral ischemic focus in the right cingulate gyrus. Attention directed on follow-up restaging studies to several areas of mild enhancement as seen on series 11. 2. Stable cerebral edema and intracranial mass effect at this time. 3. Stable lobulated 3.4 cm cavernous sinus, intra sellar, and left orbital apex meningioma.   Electronically Signed   By: Genevie Ann M.D.   On: 09/08/2017 12:32  Pathology:  Assessment/Plan 1. Meningioma [facility])  Ms. Mantione is clinically improved following gross total resection of her WHO grade I left frontal meningioma.    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  We discussed treatment and surveillance options moving forward, including surgery, radiation, "watch and wait".  At this time our recommendation is to repeat an MRI brain in 4 months for evaluation of tumor growth rate.  In the meantime she will call us with any symptoms such as impaired visual acuity, double vision, face or eyelid droop.      Recommend re-evaluation and baseline vision screening by her ophthalmologist Carolynn Sayers).  Should continue eliquis for provoked DVT/PE and follow up with pulmonology regarding recommended therapeutic course.  We appreciate the opportunity to participate in the care of [name] [name].   Screening for potential clinical trials was performed and discussed using eligibility criteria for active protocols at [facility], loco-regional tertiary centers, as well as national database available on [email or web address].    The patient is not a candidate for a research protocol at this time due to no suitable study identified.   We spent twenty additional minutes teaching regarding the natural history, biology, and historical experience in the treatment of brain tumors. We then discussed in detail the current recommendations for therapy focusing on the mode of administration, mechanism of action, anticipated toxicities, and quality of life issues associated with this plan. We also provided teaching sheets for the patient to take home as an additional resource.  All questions were answered. The patient knows to call the clinic with any problems, questions or concerns. No barriers to learning were detected.  The total time spent in the encounter was 45 minutes and more than 50% was on counseling and review of test results   Ventura Sellers, MD Medical Director of Neuro-Oncology [facility] at [place] 10/19/17 11:08 AM

## 2017-10-20 ENCOUNTER — Other Ambulatory Visit: Payer: Self-pay | Admitting: *Deleted

## 2017-10-20 DIAGNOSIS — D329 Benign neoplasm of meninges, unspecified: Secondary | ICD-10-CM

## 2017-10-20 NOTE — Progress Notes (Signed)
Patient called requesting discontinuation order be sent to [facility] for in home oxygen.  Per yesterday Dr Mickeal Skinner accounted that she did not need in home oxygen anymore.  Order to stop faxed to [phone]

## 2017-11-01 ENCOUNTER — Ambulatory Visit: Payer: Medicare Other | Admitting: Pulmonary Disease

## 2017-11-01 ENCOUNTER — Encounter: Payer: Self-pay | Admitting: Pulmonary Disease

## 2017-11-01 VITALS — BP 130/78 | HR 88 | Ht 62.0 in | Wt 150.0 lb

## 2017-11-01 DIAGNOSIS — R0600 Dyspnea, unspecified: Secondary | ICD-10-CM

## 2017-11-01 DIAGNOSIS — R0609 Other forms of dyspnea: Secondary | ICD-10-CM | POA: Diagnosis not present

## 2017-11-01 DIAGNOSIS — I2609 Other pulmonary embolism with acute cor pulmonale: Secondary | ICD-10-CM | POA: Diagnosis not present

## 2017-11-01 MED ORDER — APIXABAN 5 MG PO TABS: 5.0000 mg | ORAL_TABLET | Freq: Two times a day (BID) | ORAL | 5 refills | Status: DC

## 2017-11-01 NOTE — Addendum Note (Signed)
Addended by: Valerie Salts on: 11/01/2017 11:20 AM   Modules accepted: Orders

## 2017-11-01 NOTE — Patient Instructions (Signed)
Acute provoked pulmonary embolism: Continue taking Eliquis twice a day We will refill the prescription for Eliquis If you have any bleeding stopped taking Eliquis right away and let us know If you were in an accident or have any sort of injury please be be sure to inform the emergency providers that you take Eliquis  We will see you back in 6 months or sooner if needed

## 2017-11-01 NOTE — Progress Notes (Signed)
Synopsis: Referred in October 2019 for Dyspnea, history of PE  Subjective:   PATIENT ID: [name] [name] GENDER: female DOB: March 11, 1945, MRN: [id number]   HPI  Chief Complaint  Patient presents with  . Pulm Consult    Referred by Dr. Lorelei Pont for pulmonary embolism. This was discovered after she had surgery in Sept 2019 for brain tumor.     [name] [name] had a brain tumor which was resected in September and about three weeks later she developed a low grade fever.  She was eventually found to have a PE.  She says that with the fever she went back to see her PCP but she ended up going to the ER.  She was also having dypsnea.  She says that she was noticing dyspnea with exertion after the surgery.  She was also very weak at the time.  She still feels somewhat short of breath, but it has improved some since coming home.  She says taht during the hospitalization she had a cough.  Her husband notes that she has been coughing for several months.  She was sent home with oxygen after the PE and she used it for about two weeks.  She has not needed it for the last two weeks.  She is now back to cleaning her house for the last week. She has been sweeping, carry laundry.  She does run a vacuum cleaner.  However she still feels dyspneic when climbing stairs.    Past Medical History:  Diagnosis Date  . Cancer ([place])    skin cancer   . Hyperlipidemia   . Osteoporosis   . PONV (postoperative nausea and vomiting)    with breast surgery     Family History  Problem Relation Age of Onset  . Breast cancer Mother   . Heart disease Father      Social History   Socioeconomic History  . Marital status: Single    Spouse name: Not on file  . Number of children: Not on file  . Years of education: Not on file  . Highest education level: Not on file  Occupational History  . Not on file  Social Needs  . Financial resource strain: Not on file  . Food insecurity:    Worry: Not on file    Inability: Not on  file  . Transportation needs:    Medical: Not on file    Non-medical: Not on file  Tobacco Use  . Smoking status: Never Smoker  . Smokeless tobacco: Never Used  Substance and Sexual Activity  . Alcohol use: No  . Drug use: No  . Sexual activity: Not on file  Lifestyle  . Physical activity:    Days per week: Not on file    Minutes per session: Not on file  . Stress: Not on file  Relationships  . Social connections:    Talks on phone: Not on file    Gets together: Not on file    Attends religious service: Not on file    Active member of club or organization: Not on file    Attends meetings of clubs or organizations: Not on file    Relationship status: Not on file  . Intimate partner violence:    Fear of current or ex partner: Not on file    Emotionally abused: Not on file    Physically abused: Not on file    Forced sexual activity: Not on file  Other Topics Concern  . Not on file  Social  History Narrative   Lives home with home.  Not working.  Education:  HS grad.  [name] [name] Fiance.       Allergies  Allergen Reactions  . Bee Venom Swelling     Outpatient Medications Prior to Visit  Medication Sig Dispense Refill  . Biotin w/ Vitamins C & E (HAIR SKIN & NAILS GUMMIES PO) Take 1 tablet by mouth daily.    . Calcium Carbonate-Vitamin D (CALCIUM 600/VITAMIN D) 600-400 MG-UNIT chew tablet Chew 1 tablet by mouth daily.    Marland Kitchen ELIQUIS STARTER PACK (ELIQUIS STARTER PACK) 5 MG TABS Take as directed on package: start with two-5mg  tablets twice daily for 7 days. On day 8, switch to one-5mg  tablet twice daily. 1 each 0  . ezetimibe (ZETIA) 10 MG tablet Take 1 tablet (10 mg total) by mouth daily. 90 tablet 3  . benzonatate (TESSALON) 100 MG capsule Take 1 capsule (100 mg total) by mouth 3 (three) times daily. 20 capsule 0  . dextromethorphan-guaiFENesin (MUCINEX DM) 30-600 MG 12hr tablet Take 1 tablet by mouth 2 (two) times daily. 30 tablet 0  . doxycycline (VIBRAMYCIN) 100 MG capsule  Take 1 capsule (100 mg total) by mouth 2 (two) times daily. 20 capsule 0  . furosemide (LASIX) 20 MG tablet Take 1 tablet (20 mg total) by mouth daily as needed. 30 tablet 0  . HYDROcodone-acetaminophen (NORCO/VICODIN) 5-325 MG tablet Take 1 tablet by mouth every 4 (four) hours as needed for moderate pain. (Patient not taking: Reported on 09/28/2017) 30 tablet 0   No facility-administered medications prior to visit.     Review of Systems  Constitutional: Negative for chills, fever, malaise/fatigue and weight loss.  HENT: Negative for congestion, nosebleeds, sinus pain and sore throat.   Eyes: Negative for photophobia, pain and discharge.  Respiratory: Positive for cough and shortness of breath. Negative for hemoptysis, sputum production and wheezing.   Cardiovascular: Negative for chest pain, palpitations, orthopnea and leg swelling.  Gastrointestinal: Negative for abdominal pain, constipation, diarrhea, nausea and vomiting.  Genitourinary: Negative for dysuria, frequency, hematuria and urgency.  Musculoskeletal: Negative for back pain, joint pain, myalgias and neck pain.  Skin: Negative for itching and rash.  Neurological: Negative for tingling, tremors, sensory change, speech change, focal weakness, seizures, weakness and headaches.  Psychiatric/Behavioral: Negative for memory loss, substance abuse and suicidal ideas. The patient is not nervous/anxious.       Objective:  Physical Exam   Vitals:   11/01/17 1023  BP: 130/78  Pulse: 88  SpO2: 98%  Weight: 150 lb (68 kg)  Height: 5\' 2"  (1.575 m)    Gen: well appearing, no acute distress HENT: recent scalp incision well healed, OP clear, neck supple without masses Eyes: PERRL, EOMi Lymph: no cervical lymphadenopathy PULM: CTA B CV: RRR, no mgr, no JVD GI: BS+, soft, nontender, no hsm Derm: no rash or skin breakdown MSK: normal bulk and tone Neuro: A&Ox4, CN II-XII intact, strength 5/5 in all 4 extremities Psyche: normal mood  and affect   CBC    Component Value Date/Time   WBC 8.1 10/01/2017 0108   RBC 3.32 (L) 10/01/2017 0108   HGB 10.4 (L) 10/01/2017 0108   HCT 32.7 (L) 10/01/2017 0108   PLT 172 10/01/2017 0108   MCV 98.5 10/01/2017 0108   MCH 31.3 10/01/2017 0108   MCHC 31.8 10/01/2017 0108   RDW 13.9 10/01/2017 0108   LYMPHSABS 0.5 (L) 09/28/2017 1025   MONOABS 0.6 09/28/2017 1025   EOSABS 0.2  09/28/2017 1025   BASOSABS 0.0 09/28/2017 1025     Chest imaging: September 2019 CT chest images independently reviewed showing a bilateral pulmonary embolism, some mosaicism and atelectasis throughout the lungs but no other clear pulmonary parenchymal abnormality  PFT:  Labs:  Path: 09/2017 Brain mass: meningioma  Echo:  September 2019 echocardiogram showed an LVEF of 65 to 70% no wall motion abnormalities.  Grade 1 diastolic dysfunction.  Normal RV size and function  Heart Catheterization:  Other imaging: September 2019 vascular ultrasound of the lower extremities shows no evidence of DVT   Hospitalization records from September 2019 reviewed where the patient was admitted and treated for a bilateral pulmonary embolism.  This was listed as "moderate clot burden".  An echocardiogram was performed and showed normal right ventricular function.  See description above.    Assessment & Plan:   Dyspnea on exertion  Acute pulmonary embolism with acute cor pulmonale, unspecified pulmonary embolism type [facility])  Discussion: This is a pleasant 72 year old female who had a provoked pulmonary embolism after a major brain surgery.  She has no prior history of blood clots and no family history of blood clots.  Fortunately she did not have evidence of pulmonary hypertension after her pulmonary embolism.  I believe that Eliquis is a good choice for her.  Today we talked about the fact that she is at increased risk for bleeding while taking this medicine but fortunately with Eliquis in particular her risk of  life-threatening bleeding is less than 1%.  I advised her that should she develop bleeding she should stop taking the medicine and contact us right away.  If she is in any sort of accident she needs to make sure she tells her emergency providers that she takes Eliquis.  Because this was a provoked pulmonary embolism she should be treated for 3 to 6 months and then we can stop Eliquis.  I will plan on seeing her back in 6 months.  Plan: Acute provoked pulmonary embolism: Continue taking Eliquis twice a day We will refill the prescription for Eliquis If you have any bleeding stopped taking Eliquis right away and let us know If you were in an accident or have any sort of injury please be be sure to inform the emergency providers that you take Eliquis  We will see you back in 6 months or sooner if needed    Current Outpatient Medications:  .  Biotin w/ Vitamins C & E (HAIR SKIN & NAILS GUMMIES PO), Take 1 tablet by mouth daily., Disp: , Rfl:  .  Calcium Carbonate-Vitamin D (CALCIUM 600/VITAMIN D) 600-400 MG-UNIT chew tablet, Chew 1 tablet by mouth daily., Disp: , Rfl:  .  ELIQUIS STARTER PACK (ELIQUIS STARTER PACK) 5 MG TABS, Take as directed on package: start with two-5mg  tablets twice daily for 7 days. On day 8, switch to one-5mg  tablet twice daily., Disp: 1 each, Rfl: 0 .  ezetimibe (ZETIA) 10 MG tablet, Take 1 tablet (10 mg total) by mouth daily., Disp: 90 tablet, Rfl: 3

## 2017-11-07 ENCOUNTER — Telehealth: Payer: Self-pay | Admitting: Diagnostic Neuroimaging

## 2017-11-07 NOTE — Telephone Encounter (Signed)
Due to Dr. Si Raider leaving the practice pt would like her referral sent to Dr. Norton Pastel - [facility] in [facility]

## 2017-11-07 NOTE — Telephone Encounter (Signed)
I have sent referral to Dr. Vikki Ports in [facility] per Patient . Patient has telephone Number telephone [phone] - Fax [phone] Patient is aware of all details .

## 2017-11-15 DIAGNOSIS — Z961 Presence of intraocular lens: Secondary | ICD-10-CM | POA: Diagnosis not present

## 2017-11-15 DIAGNOSIS — D1801 Hemangioma of skin and subcutaneous tissue: Secondary | ICD-10-CM | POA: Diagnosis not present

## 2017-11-15 DIAGNOSIS — H40013 Open angle with borderline findings, low risk, bilateral: Secondary | ICD-10-CM | POA: Diagnosis not present

## 2017-11-15 DIAGNOSIS — D320 Benign neoplasm of cerebral meninges: Secondary | ICD-10-CM | POA: Diagnosis not present

## 2017-11-17 ENCOUNTER — Other Ambulatory Visit: Payer: Self-pay | Admitting: *Deleted

## 2017-11-17 DIAGNOSIS — D329 Benign neoplasm of meninges, unspecified: Secondary | ICD-10-CM

## 2018-01-03 DIAGNOSIS — L820 Inflamed seborrheic keratosis: Secondary | ICD-10-CM | POA: Diagnosis not present

## 2018-01-03 DIAGNOSIS — L570 Actinic keratosis: Secondary | ICD-10-CM | POA: Diagnosis not present

## 2018-01-03 DIAGNOSIS — C44519 Basal cell carcinoma of skin of other part of trunk: Secondary | ICD-10-CM | POA: Diagnosis not present

## 2018-01-03 DIAGNOSIS — Z85828 Personal history of other malignant neoplasm of skin: Secondary | ICD-10-CM | POA: Diagnosis not present

## 2018-01-03 DIAGNOSIS — D485 Neoplasm of uncertain behavior of skin: Secondary | ICD-10-CM | POA: Diagnosis not present

## 2018-01-06 NOTE — Progress Notes (Signed)
[place] at [facility] [address] [place], [place] [address] [phone]  Date:  01/09/2018   Name:  [name] [name]   DOB:  [birth year]   MRN:  [id number]  PCP:  Darreld Mclean, MD    Chief Complaint: Joint Pain (3-4 weeks, knee and hip pain, trouble sleeping facial swelling?) and Discuss Medications (ok to take 2 flexiril? alternative for hydrocodone? both medications not on med list?  )   History of Present Illness:  [name] [name] is a 73 y.o. very pleasant female patient who presents with the following:  [name] [name] has history of hyperlipidemia, GERD, osteoporosis. She was found to have a benign brain tumor last year after some behavior changes.  She was admitted for a craniotomy and excision of her meningioma in September 2019, unfortunately she developed a postop pulmonary embolism and had to be readmitted to the hospital later that month. I last saw her in early October 2019 at which point she was using Eliquis and slowly recovering.  She was also using oxygen at that time  Reviewed pulmonology note from late October 2019, at that point the plan to continue Eliquis for 3 to 6 months She will be on this for 2 more months or so.   She feels like her breathing and exercise tolerance continues to improve.  I also reviewed her most recent oncology note from October of last year.  They plan to repeat MRI in 4 months.  She does have another meningioma which is being observed for the time being as it is not causing any symptoms  She notes that she is having some lower back, and bilateral leg joint pains for the last 2-3 weeks.  Worse the last 2-3 nights  She tried some leftover flexeril last night She also used just a few of her hydrocodone.  She has plenty of these left, but does not wish to use them if she can help it.  She is afraid of becoming addicted  She is not having headaches really, she feels that she recovered very well  from her brain surgery  She did have some lumbar films in 08/2017 per her chiropractor but she did not keep going to see them. We did do lumbar films for her earlier this year, in March.  Report follows:  IMPRESSION: Scoliosis and degenerative change lumbar spine. No acute skeletal Abnormality.  She has not had any knee or hip films however   BP Readings from Last 3 Encounters:  01/09/18 140/85  11/01/17 130/78  10/19/17 132/81     Patient Active Problem List   Diagnosis Date Noted  . Acute pulmonary embolism ([place]) 09/28/2017  . Dyspnea on exertion 09/28/2017  . Hypoxia 09/28/2017  . Meningioma ([place]) 09/07/2017  . FATIGUE 09/12/2009  . MYALGIA 10/31/2008  . DYSPNEA ON EXERTION 10/31/2008  . Hyperlipidemia 11/11/2006  . OTHER SPECIFIED ANEMIAS 11/11/2006  . GERD 11/11/2006  . Osteoporosis 11/11/2006  . IRRITABLE BOWEL SYNDROME, HX OF 10/20/2006    Past Medical History:  Diagnosis Date  . Cancer ([place])    skin cancer   . Hyperlipidemia   . Osteoporosis   . PONV (postoperative nausea and vomiting)    with breast surgery    Past Surgical History:  Procedure Laterality Date  . APPLICATION OF CRANIAL NAVIGATION N/A 09/07/2017   Procedure: APPLICATION OF CRANIAL NAVIGATION;  Surgeon: Kary Kos, MD;  Location: [facility];  Service: Neurosurgery;  Laterality: N/A;  .  BREAST ENHANCEMENT SURGERY    . BUNIONECTOMY    . COLONOSCOPY    . CRANIOTOMY N/A 09/07/2017   Procedure: CRAINIOTOMY BI CORONAL w/BRAINLAB;  Surgeon: Kary Kos, MD;  Location: [facility];  Service: Neurosurgery;  Laterality: N/A;  . EYE SURGERY Bilateral    cataract surgery  . SKIN CANCER EXCISION    . WISDOM TOOTH EXTRACTION      Social History   Tobacco Use  . Smoking status: Never Smoker  . Smokeless tobacco: Never Used  Substance Use Topics  . Alcohol use: No  . Drug use: No    Family History  Problem Relation Age of Onset  . Breast cancer Mother   . Heart disease Father     Allergies  Allergen  Reactions  . Bee Venom Swelling    Medication list has been reviewed and updated.  Current Outpatient Medications on File Prior to Visit  Medication Sig Dispense Refill  . apixaban (ELIQUIS) 5 MG TABS tablet Take 1 tablet (5 mg total) by mouth 2 (two) times daily. 60 tablet 5  . Biotin w/ Vitamins C & E (HAIR SKIN & NAILS GUMMIES PO) Take 1 tablet by mouth daily.    . Calcium Carbonate-Vitamin D (CALCIUM 600/VITAMIN D) 600-400 MG-UNIT chew tablet Chew 1 tablet by mouth daily.    Marland Kitchen ezetimibe (ZETIA) 10 MG tablet Take 1 tablet (10 mg total) by mouth daily. 90 tablet 3   No current facility-administered medications on file prior to visit.     Review of Systems:  As per HPI- otherwise negative. No fever or chills, no chest pain or shortness of breath.  No loss of bowel or bladder control.  Physical Examination: Vitals:   01/09/18 0945 01/09/18 1000  BP: (!) 150/108 140/85  Pulse: 95   Resp: 16   SpO2: 98%    Vitals:   01/09/18 0945  Weight: 152 lb (68.9 kg)  Height: 5\' 2"  (1.575 m)   Body mass index is 27.8 kg/m. Ideal Body Weight: Weight in (lb) to have BMI = 25: 136.4  GEN: WDWN, NAD, Non-toxic, A & O x 3, mild overweight, looks well HEENT: Atraumatic, Normocephalic. Neck supple. No masses, No LAD. Ears and Nose: No external deformity. CV: RRR, No M/G/R. No JVD. No thrill. No extra heart sounds. PULM: CTA B, no wheezes, crackles, rhonchi. No retractions. No resp. distress. No accessory muscle use. ABD: S, NT, ND, +BS. No rebound. No HSM. EXTR: No c/c/e NEURO Normal gait.  PSYCH: Normally interactive. Conversant. Not depressed or anxious appearing.  Calm demeanor.  Both knees and hips display normal range of motion.  There is mild crepitus in the bilateral knees. Bilateral lower extremity strength, sensation, and deep tendon reflexes normal.  Negative straight leg raise bilaterally. She indicates the area overlying the midline mid to lower lumbar spine as the area of her  back pain. Flexion and extension are normal for age   Assessment and Plan: Chronic pain of both knees - Plan: DG Knee 1-2 Views Left, DG Knee 1-2 Views Right  Chronic hip pain, bilateral - Plan: DG HIP UNILAT W OR W/O PELVIS 2-3 VIEWS LEFT, CANCELED: DG HIP UNILAT W OR W/O PELVIS 2-3 VIEWS RIGHT  Lumbar degenerative disc disease  Here today with pain in her back, bilateral hips, bilateral knees.  She has known history of degenerative disease of her back.  We will also obtain hip and knee films today.  Advised her that Tylenol would be a good option to  use for day-to-day pain.  We want to limit or avoid NSAIDs as she is on Eliquis.    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 She will use Tylenol as we discussed, but will let me know if her pain does not start to calm down again. Also asked her to come and see me in March for routine fasting labs.  Dg Knee 1-2 Views Left  Result Date: 01/09/2018 CLINICAL DATA:  Chronic left knee pain, no known injury, initial encounter EXAM: LEFT KNEE - 2 VIEW COMPARISON:  None. FINDINGS: No acute fracture or dislocation is noted. No joint effusion is seen. No soft tissue abnormality is noted. IMPRESSION: No acute abnormality seen. Electronically Signed   By: Inez Catalina M.D.   On: 01/09/2018 10:36   Dg Knee 1-2 Views Right  Result Date: 01/09/2018 CLINICAL DATA:  Chronic knee pain, no known injury, initial encounter EXAM: RIGHT KNEE - 2 VIEW COMPARISON:  None. FINDINGS: No evidence of fracture, dislocation, or joint effusion. No evidence of arthropathy or other focal bone abnormality. Soft tissues are unremarkable. IMPRESSION: No acute abnormality noted. Electronically Signed   By: Inez Catalina M.D.   On: 01/09/2018 10:37   Dg Hips Bilat With Pelvis 2v  Result Date: 01/09/2018 CLINICAL DATA:  Chronic hip pain, no known injury, initial encounter EXAM: DG  HIP (WITH OR WITHOUT PELVIS) 2V BILAT COMPARISON:  None. FINDINGS: Pelvic ring is intact. Mild degenerative changes of the hip joints are seen bilaterally. No acute fracture or dislocation is noted. No soft tissue abnormality is seen. IMPRESSION: No acute abnormality noted. Electronically Signed   By: Inez Catalina M.D.   On: 01/09/2018 10:27

## 2018-01-09 ENCOUNTER — Ambulatory Visit (HOSPITAL_BASED_OUTPATIENT_CLINIC_OR_DEPARTMENT_OTHER)
Admission: RE | Admit: 2018-01-09 | Discharge: 2018-01-09 | Disposition: A | Discharge status: Home / Self Care | Payer: Medicare Other | Source: Ambulatory Visit | Attending: Family Medicine | Admitting: Family Medicine

## 2018-01-09 ENCOUNTER — Ambulatory Visit: Payer: Medicare Other | Admitting: Family Medicine

## 2018-01-09 ENCOUNTER — Encounter: Payer: Self-pay | Admitting: Family Medicine

## 2018-01-09 ENCOUNTER — Other Ambulatory Visit: Payer: Self-pay | Admitting: Family Medicine

## 2018-01-09 VITALS — BP 140/85 | HR 95 | Resp 16 | Ht 62.0 in | Wt 152.0 lb

## 2018-01-09 DIAGNOSIS — G8929 Other chronic pain: Secondary | ICD-10-CM

## 2018-01-09 DIAGNOSIS — M25562 Pain in left knee: Secondary | ICD-10-CM

## 2018-01-09 DIAGNOSIS — M25561 Pain in right knee: Secondary | ICD-10-CM | POA: Diagnosis not present

## 2018-01-09 DIAGNOSIS — M25552 Pain in left hip: Secondary | ICD-10-CM | POA: Diagnosis not present

## 2018-01-09 DIAGNOSIS — M25551 Pain in right hip: Secondary | ICD-10-CM | POA: Insufficient documentation

## 2018-01-09 DIAGNOSIS — M51369 Other intervertebral disc degeneration, lumbar region without mention of lumbar back pain or lower extremity pain: Secondary | ICD-10-CM

## 2018-01-09 DIAGNOSIS — M5136 Other intervertebral disc degeneration, lumbar region: Secondary | ICD-10-CM

## 2018-01-09 DIAGNOSIS — M160 Bilateral primary osteoarthritis of hip: Secondary | ICD-10-CM | POA: Diagnosis not present

## 2018-01-09 NOTE — Patient Instructions (Signed)
It was good to see you today- please go to the ground floor to have x-rays done of your hips and knees.   I will be in touch with your reports asap  Try tylenol as needed for aches and pains in your joints  Let me know how this does for you

## 2018-01-12 ENCOUNTER — Telehealth: Payer: Self-pay | Admitting: *Deleted

## 2018-01-12 NOTE — Telephone Encounter (Signed)
Patient called for advice on whether or not she should confirm a appointment that she is being given with Dr Norton Pastel in [facility].  Explained per Dr Mickeal Skinner that this doctor could be helpful with cognitive functioning testing.  He also advised that her current brain tumor and the post resection could be a known contributor to the issues she is having with memory.    Patient advised that she will not proceed at this appointment at this time.  She will keep her appointments with [place].

## 2018-01-30 ENCOUNTER — Encounter: Payer: Medicare Other | Admitting: Psychology

## 2018-02-02 ENCOUNTER — Ambulatory Visit: Payer: Medicare Other | Admitting: Nurse Practitioner

## 2018-02-03 ENCOUNTER — Other Ambulatory Visit: Payer: Self-pay | Admitting: Internal Medicine

## 2018-02-13 ENCOUNTER — Ambulatory Visit
Admission: RE | Admit: 2018-02-13 | Discharge: 2018-02-13 | Disposition: A | Discharge status: Home / Self Care | Payer: Medicare Other | Source: Ambulatory Visit | Attending: Internal Medicine | Admitting: Internal Medicine

## 2018-02-13 DIAGNOSIS — D329 Benign neoplasm of meninges, unspecified: Secondary | ICD-10-CM

## 2018-02-13 DIAGNOSIS — D320 Benign neoplasm of cerebral meninges: Secondary | ICD-10-CM | POA: Diagnosis not present

## 2018-02-13 MED ORDER — GADOBENATE DIMEGLUMINE 529 MG/ML IV SOLN
14.0000 mL | Freq: Once | INTRAVENOUS | Status: AC | PRN
  Administered 2018-02-13: 14 mL via INTRAVENOUS

## 2018-02-14 ENCOUNTER — Other Ambulatory Visit: Payer: Self-pay | Admitting: Radiation Therapy

## 2018-02-16 DIAGNOSIS — D329 Benign neoplasm of meninges, unspecified: Secondary | ICD-10-CM | POA: Diagnosis not present

## 2018-02-20 ENCOUNTER — Inpatient Hospital Stay: Payer: Medicare Other | Attending: Internal Medicine | Admitting: Internal Medicine

## 2018-02-20 ENCOUNTER — Telehealth: Payer: Self-pay

## 2018-02-20 VITALS — BP 147/85 | HR 83 | Temp 98.4°F | Resp 18 | Ht 62.0 in | Wt 151.5 lb

## 2018-02-20 DIAGNOSIS — D329 Benign neoplasm of meninges, unspecified: Secondary | ICD-10-CM

## 2018-02-20 DIAGNOSIS — Z85828 Personal history of other malignant neoplasm of skin: Secondary | ICD-10-CM

## 2018-02-20 DIAGNOSIS — D320 Benign neoplasm of cerebral meninges: Secondary | ICD-10-CM | POA: Diagnosis not present

## 2018-02-20 DIAGNOSIS — Z7901 Long term (current) use of anticoagulants: Secondary | ICD-10-CM | POA: Diagnosis not present

## 2018-02-20 DIAGNOSIS — E785 Hyperlipidemia, unspecified: Secondary | ICD-10-CM | POA: Insufficient documentation

## 2018-02-20 NOTE — Telephone Encounter (Signed)
Printed avs and calender of upcoming appointment. Per 2/17 los 

## 2018-02-20 NOTE — Progress Notes (Signed)
[facility] [place], [place] [address] [phone]   Interval Evaluation  Date of Service: 02/20/18 Patient Name: [name] [name] Patient MRN: [id number] Patient DOB: [birth year] Provider: Ventura Sellers, MD  Identifying Statement:  Lakelyn Straus is a 73 y.o. 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 Saintclair Halsted.  Path is WHO grade I meningioma  Interval History:  Latronda Spink presents for follow up after recent MRI brain.  She describes no new or progressive neurologic deficits.  She has no visual changes, and has been following with [facility] ophthalmology for visual exam.  No seizures or headaches.   H+P (10/19/17) Patient presented to medical attention initially in August after complaints of loss of short term memory, depressed affect, and new onset headaches.  MRI demonstrated 2 lesions c/w meningioma, one in the left frontal lobe and another tumor in the left cavernous sinus and orbital apex.  She denied visual symptoms including double vision.  Dr. Saintclair Halsted resected the left frontal tumor on 09/07/17, and following surgery she described clinical improvement.  Her memory is still "not 100%" but she feels more like her "normal self".  Patient and husband deny any behavioral sytmpoms or seizures.  Headaches have resolved.  Unfortunately 2-3 weeks after surgery she developed shortness of breath and was found to have a pulmonary embolism.  She was started on Eliquis and has been taking PRN oxygen, although respiratory symptoms have improved dramatically.    Medications: Current Outpatient Medications on File Prior to Visit  Medication Sig Dispense Refill  . apixaban (ELIQUIS) 5 MG TABS tablet Take 1 tablet (5 mg total) by mouth 2 (two) times  daily. 60 tablet 5  . Biotin w/ Vitamins C & E (HAIR SKIN & NAILS GUMMIES PO) Take 1 tablet by mouth daily.    . Calcium Carbonate-Vitamin D (CALCIUM 600/VITAMIN D) 600-400 MG-UNIT chew tablet Chew 1 tablet by mouth daily.    Marland Kitchen ezetimibe (ZETIA) 10 MG tablet Take 1 tablet (10 mg total) by mouth daily. 90 tablet 3   No current facility-administered medications on file prior to visit.     Allergies:  Allergies  Allergen Reactions  . Bee Venom Swelling   Past Medical History:  Past Medical History:  Diagnosis Date  . Cancer ([place])    skin cancer   . Hyperlipidemia   . Osteoporosis   . PONV (postoperative nausea and vomiting)    with breast surgery   Past Surgical History:  Past Surgical History:  Procedure Laterality Date  . APPLICATION OF CRANIAL NAVIGATION N/A 09/07/2017   Procedure: APPLICATION OF CRANIAL NAVIGATION;  Surgeon: Kary Kos, MD;  Location: [facility];  Service: Neurosurgery;  Laterality: N/A;  . BREAST ENHANCEMENT SURGERY    . BUNIONECTOMY    . COLONOSCOPY    . CRANIOTOMY N/A 09/07/2017   Procedure: CRAINIOTOMY BI CORONAL w/BRAINLAB;  Surgeon: Kary Kos, MD;  Location: [facility];  Service: Neurosurgery;  Laterality: N/A;  . EYE SURGERY Bilateral    cataract surgery  . SKIN CANCER EXCISION    . WISDOM TOOTH EXTRACTION     Social History:  Social History   Socioeconomic History  . Marital status: Single    Spouse name: Not on file  . Number of children: Not on file  . Years of education:  Not on file  . Highest education level: Not on file  Occupational History  . Not on file  Social Needs  . Financial resource strain: Not on file  . Food insecurity:    Worry: Not on file    Inability: Not on file  . Transportation needs:    Medical: Not on file    Non-medical: Not on file  Tobacco Use  . Smoking status: Never Smoker  . Smokeless tobacco: Never Used  Substance and Sexual Activity  . Alcohol use: No  . Drug use: No  . Sexual activity: Not on file    Lifestyle  . Physical activity:    Days per week: Not on file    Minutes per session: Not on file  . Stress: Not on file  Relationships  . Social connections:    Talks on phone: Not on file    Gets together: Not on file    Attends religious service: Not on file    Active member of club or organization: Not on file    Attends meetings of clubs or organizations: Not on file    Relationship status: Not on file  . Intimate partner violence:    Fear of current or ex partner: Not on file    Emotionally abused: Not on file    Physically abused: Not on file    Forced sexual activity: Not on file  Other Topics Concern  . Not on file  Social History Narrative   Lives home with home.  Not working.  Education:  HS grad.  [name] Fiance.     Family History:  Family History  Problem Relation Age of Onset  . Breast cancer Mother   . Heart disease Father     Review of Systems: Constitutional: Denies fevers, chills or abnormal weight loss Eyes: Denies blurriness of vision Ears, nose, mouth, throat, and face: Denies mucositis or sore throat Respiratory: +cough, dyspnea Cardiovascular: Denies palpitation, chest discomfort or lower extremity swelling Gastrointestinal:  Denies nausea, constipation, diarrhea GU: Denies dysuria or incontinence Skin: Denies abnormal skin rashes Neurological: Per HPI Musculoskeletal: Denies joint pain, back or neck discomfort. No decrease in ROM Behavioral/Psych: Denies anxiety, disturbance in thought content, and mood instability  Physical Exam: Vitals:   02/20/18 1106  BP: (!) 147/85  Pulse: 83  Resp: 18  Temp: 98.4 F (36.9 C)  SpO2: 96%   KPS: 90. General: Alert, cooperative, pleasant, in no acute distress Head: Craniotomy scar noted, dry and intact. EENT: No conjunctival injection or scleral icterus. Oral mucosa moist Lungs: Resp effort normal Cardiac: Regular rate and rhythm Abdomen: Soft, non-distended abdomen Skin: No rashes cyanosis  or petechiae. Extremities: No clubbing or edema  Neurologic Exam: Mental Status: Awake, alert, attentive to examiner. Oriented to self and environment. Language is fluent with intact comprehension.  Cranial Nerves: Visual acuity is grossly normal. Visual fields are full. Extra-ocular movements intact. No ptosis. Face is symmetric, tongue midline. Motor: Tone and bulk are normal. Power is full in both arms and legs. Reflexes are symmetric, no pathologic reflexes present. Intact finger to nose bilaterally Sensory: Intact to light touch and temperature Gait: Normal and tandem gait is normal.   Labs: I have reviewed the data as listed    Component Value Date/Time   NA 140 09/29/2017 0639   NA 141 07/15/2015   NA 141 07/15/2015   K 4.0 09/29/2017 0639   CL 106 09/29/2017 0639   CO2 26 09/29/2017 0639   GLUCOSE 89  09/29/2017 0639   BUN 5 (L) 09/29/2017 0639   BUN 22 (A) 07/15/2015   BUN 22 (A) 07/15/2015   CREATININE 0.75 09/29/2017 0639   CALCIUM 8.4 (L) 09/29/2017 0639   PROT 5.5 (L) 09/29/2017 0639   ALBUMIN 2.5 (L) 09/29/2017 0639   AST 19 09/29/2017 0639   ALT 19 09/29/2017 0639   ALKPHOS 48 09/29/2017 0639   BILITOT 0.5 09/29/2017 0639   GFRNONAA >60 09/29/2017 0639   GFRAA >60 09/29/2017 0639   Lab Results  Component Value Date   WBC 8.1 10/01/2017   NEUTROABS 7.5 (H) 09/28/2017   HGB 10.4 (L) 10/01/2017   HCT 32.7 (L) 10/01/2017   MCV 98.5 10/01/2017   PLT 172 10/01/2017   Imaging:  [facility] Clinician Interpretation: I have personally reviewed the CNS images as listed.  My interpretation, in the context of the patient's clinical presentation, is stable disease  Mr Jeri Cos Wo Contrast  Result Date: 02/13/2018 CLINICAL DATA:  Followup meningioma Creatinine was obtained on site at [facility] at [address] Results: Creatinine 1.0 mg/dL. EXAM: MRI HEAD WITHOUT AND WITH CONTRAST TECHNIQUE: Multiplanar, multiecho pulse sequences of the brain and surrounding  structures were obtained without and with intravenous contrast. CONTRAST:  76mL MULTIHANCE GADOBENATE DIMEGLUMINE 529 MG/ML IV SOLN COMPARISON:  09/08/2017.  08/22/2017. FINDINGS: Brain: Previous frontal craniotomy for resection of a large meningioma along the left anterior falx. No evidence of residual or recurrent tumor in this location. Previously, there was question regarding calvarial involvement. I do not think there is strong evidence of that on today's study. There is atrophy and gliosis within the left frontal lobe secondary to the previous presence of the tumor. There is a new small region of gliosis in the right frontal lobe, probably subsequent to the procedure. 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 Filling of the cavernous sinus on the left with extension into the sella, deviating the infundibulum towards the right. Suprasellar extension as seen previously. Involvement of the orbital apex on the left which appears the same. Some tumor extension into the lateral aspect of the left division of the sphenoid sinus Chronic small-vessel ischemic changes of the cerebral hemispheric white matter elsewhere appears stable. No evidence of recent infarction. No hydrocephalus. No subdural collection. Vascular: Major vessels at the base of the brain show flow. Skull and upper cervical spine: Otherwise negative. Benign enhancing focus within the right parietal bone is unchanged. Sinuses/Orbits: No sign of inflammatory sinus disease. Orbits negative other than above. Other: None IMPRESSION: Gross total resection of the left anterior para falcine meningioma. No evidence of residual or recurrent tumor in that region. Atrophy, gliosis and encephalomalacia in the left frontal lobe. Small area of newly seen gliosis in the right frontal lobe. 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 See above for full discussion. Electronically Signed   By: Nelson Chimes M.D.   On: 02/13/2018 11:28    Pathology:  Assessment/Plan 1. Meningioma [facility])  Ms. Golberg is clinically and radiographically stable today.  Left frontal resection cavity is clear, and cavernous sinus tumor is unchanged in the interval.  Should continue eliquis for provoked DVT/PE and follow up with pulmonology regarding recommended therapeutic course.  We appreciate the opportunity to participate in the care of [facility]  [facility].  She should return to clinic in 6 months with an MRI brain for review.  All questions were answered. The patient knows to call the clinic with any problems, questions or concerns. No barriers to learning were detected.  The total time spent in the encounter was 25 minutes and more than 50% was on counseling and review of test results   Ventura Sellers, MD Medical Director of Neuro-Oncology [facility] [place] 02/20/18 11:14 AM

## 2018-02-21 ENCOUNTER — Telehealth: Payer: Self-pay | Admitting: Pulmonary Disease

## 2018-02-21 NOTE — Telephone Encounter (Signed)
Patient called and wanted to know if she needed to stay on Eliquis or quit. Or does she need an OV with [facility]. She has been on it for 6 months. She is a [facility] patient.

## 2018-02-21 NOTE — Telephone Encounter (Signed)
From 11/01/17 AVS:  Because this was a provoked pulmonary embolism she should be treated for 3 to 6 months and then we can stop Eliquis.  I will plan on seeing her back in 6 months. -----------------  lmtcb for pt.  Pt does not have a 6 month rov scheduled- this needs to be scheduled to discuss follow-up Eliquis care.

## 2018-02-21 NOTE — Telephone Encounter (Signed)
Pt called back. Appt made in [facility] on 03/23/2018 for ROV with BQ. Pt verbalized understanding and denied any further questions or concerns at this time.

## 2018-02-22 ENCOUNTER — Other Ambulatory Visit: Payer: Self-pay | Admitting: Neurosurgery

## 2018-02-22 DIAGNOSIS — M419 Scoliosis, unspecified: Secondary | ICD-10-CM

## 2018-02-23 ENCOUNTER — Encounter: Payer: Medicare Other | Admitting: Psychology

## 2018-03-07 ENCOUNTER — Ambulatory Visit
Admission: RE | Admit: 2018-03-07 | Discharge: 2018-03-07 | Disposition: A | Discharge status: Home / Self Care | Payer: Medicare Other | Source: Ambulatory Visit | Attending: Neurosurgery | Admitting: Neurosurgery

## 2018-03-07 DIAGNOSIS — M419 Scoliosis, unspecified: Secondary | ICD-10-CM

## 2018-03-07 DIAGNOSIS — M545 Low back pain: Secondary | ICD-10-CM | POA: Diagnosis not present

## 2018-03-09 DIAGNOSIS — M419 Scoliosis, unspecified: Secondary | ICD-10-CM | POA: Diagnosis not present

## 2018-03-23 ENCOUNTER — Ambulatory Visit: Payer: Medicare Other | Admitting: Pulmonary Disease

## 2018-03-23 ENCOUNTER — Other Ambulatory Visit: Payer: Self-pay

## 2018-03-23 ENCOUNTER — Encounter: Payer: Self-pay | Admitting: Pulmonary Disease

## 2018-03-23 VITALS — BP 158/84 | HR 79 | Ht 62.0 in | Wt 150.0 lb

## 2018-03-23 DIAGNOSIS — I2609 Other pulmonary embolism with acute cor pulmonale: Secondary | ICD-10-CM | POA: Diagnosis not present

## 2018-03-23 DIAGNOSIS — R0609 Other forms of dyspnea: Secondary | ICD-10-CM

## 2018-03-23 DIAGNOSIS — R0600 Dyspnea, unspecified: Secondary | ICD-10-CM

## 2018-03-23 NOTE — Progress Notes (Signed)
Synopsis: Referred in October 2019 for Dyspnea, history of PE  Subjective:   PATIENT ID: [name] [name] GENDER: female DOB: 1945/11/14, MRN: [id number]   HPI  Chief Complaint  Patient presents with  . Follow-up    She says that her breathing has been fine since the last visit.  She will sometimes note some shortness of breath but she attributes this to a lack of exercise.  She has not had problems with bleeding, chest tightness, or leg swelling.  She continues to take Eliquis.  She would like to stop it  Past Medical History:  Diagnosis Date  . Cancer ([place])    skin cancer   . Hyperlipidemia   . Osteoporosis   . PONV (postoperative nausea and vomiting)    with breast surgery      Review of Systems  Constitutional: Negative for chills, fever, malaise/fatigue and weight loss.  HENT: Negative for congestion, nosebleeds, sinus pain and sore throat.   Eyes: Negative for photophobia, pain and discharge.  Respiratory: Positive for cough and shortness of breath. Negative for hemoptysis, sputum production and wheezing.   Cardiovascular: Negative for chest pain, palpitations, orthopnea and leg swelling.  Gastrointestinal: Negative for abdominal pain, constipation, diarrhea, nausea and vomiting.  Genitourinary: Negative for dysuria, frequency, hematuria and urgency.  Musculoskeletal: Negative for back pain, joint pain, myalgias and neck pain.  Skin: Negative for itching and rash.  Neurological: Negative for tingling, tremors, sensory change, speech change, focal weakness, seizures, weakness and headaches.  Psychiatric/Behavioral: Negative for memory loss, substance abuse and suicidal ideas. The patient is not nervous/anxious.       Objective:  Physical Exam   Vitals:   03/23/18 1537  BP: (!) 158/84  Pulse: 79  SpO2: 97%  Weight: 150 lb (68 kg)  Height: 5\' 2"  (1.575 m)    Gen: well appearing HENT: OP clear, TM's clear, neck supple PULM: CTA B, normal percussion CV:  RRR, no mgr, trace edema GI: BS+, soft, nontender Derm: no cyanosis or rash Psyche: normal mood and affect    CBC    Component Value Date/Time   WBC 8.1 10/01/2017 0108   RBC 3.32 (L) 10/01/2017 0108   HGB 10.4 (L) 10/01/2017 0108   HCT 32.7 (L) 10/01/2017 0108   PLT 172 10/01/2017 0108   MCV 98.5 10/01/2017 0108   MCH 31.3 10/01/2017 0108   MCHC 31.8 10/01/2017 0108   RDW 13.9 10/01/2017 0108   LYMPHSABS 0.5 (L) 09/28/2017 1025   MONOABS 0.6 09/28/2017 1025   EOSABS 0.2 09/28/2017 1025   BASOSABS 0.0 09/28/2017 1025     Chest imaging: September 2019 CT chest images independently reviewed showing a bilateral pulmonary embolism, some mosaicism and atelectasis throughout the lungs but no other clear pulmonary parenchymal abnormality  PFT:  Labs:  Path: 09/2017 Brain mass: meningioma  Echo:  September 2019 echocardiogram showed an LVEF of 65 to 70% no wall motion abnormalities.  Grade 1 diastolic dysfunction.  Normal RV size and function  Heart Catheterization:  Other imaging: September 2019 vascular ultrasound of the lower extremities shows no evidence of DVT   Hospitalization records from September 2019 reviewed where the patient was admitted and treated for a bilateral pulmonary embolism.  This was listed as "moderate clot burden".  An echocardiogram was performed and showed normal right ventricular function.  See description above.    Assessment & Plan:   Acute pulmonary embolism with acute cor pulmonale, unspecified pulmonary embolism type (HCC)  Dyspnea on exertion  Discussion:  This has been a stable interval for [facility].  She does have some mild shortness of breath which she attributes to lack of exercise.  I offered an echocardiogram but she would prefer to forego that for now.  She just wants to stop taking Eliquis.  I advised that if she has worsening shortness of breath she should let me know because she would need to have an echo at that point.  I agree  with her that her deconditioning is the most likely cause of her mild shortness of breath.  Plan: Provoked pulmonary embolism: Stop Eliquis after you complete your current supply If he have worsening shortness of breath let me know and I will order an echocardiogram  Mild exertional shortness of breath: Start a regular exercise routine I recommend walking at a pace that makes you mildly short of breath for 20 minutes at a time, every day He should also consider a weight/resistance training routine  We will see you back as needed   Current Outpatient Medications:  .  apixaban (ELIQUIS) 5 MG TABS tablet, Take 1 tablet (5 mg total) by mouth 2 (two) times daily., Disp: 60 tablet, Rfl: 5 .  Biotin w/ Vitamins C & E (HAIR SKIN & NAILS GUMMIES PO), Take 1 tablet by mouth daily., Disp: , Rfl:  .  Calcium Carbonate-Vitamin D (CALCIUM 600/VITAMIN D) 600-400 MG-UNIT chew tablet, Chew 1 tablet by mouth daily., Disp: , Rfl:  .  ezetimibe (ZETIA) 10 MG tablet, Take 1 tablet (10 mg total) by mouth daily., Disp: 90 tablet, Rfl: 3

## 2018-03-23 NOTE — Patient Instructions (Signed)
Provoked pulmonary embolism: Stop Eliquis after you complete your current supply If he have worsening shortness of breath let me know and I will order an echocardiogram  Mild exertional shortness of breath: Start a regular exercise routine I recommend walking at a pace that makes you mildly short of breath for 20 minutes at a time, every day He should also consider a weight/resistance training routine  We will see you back as needed

## 2018-04-04 DIAGNOSIS — L820 Inflamed seborrheic keratosis: Secondary | ICD-10-CM | POA: Diagnosis not present

## 2018-04-04 DIAGNOSIS — L905 Scar conditions and fibrosis of skin: Secondary | ICD-10-CM | POA: Diagnosis not present

## 2018-04-04 DIAGNOSIS — Z85828 Personal history of other malignant neoplasm of skin: Secondary | ICD-10-CM | POA: Diagnosis not present

## 2018-04-04 DIAGNOSIS — L853 Xerosis cutis: Secondary | ICD-10-CM | POA: Diagnosis not present

## 2018-05-17 DIAGNOSIS — Z961 Presence of intraocular lens: Secondary | ICD-10-CM | POA: Diagnosis not present

## 2018-05-17 DIAGNOSIS — H0102B Squamous blepharitis left eye, upper and lower eyelids: Secondary | ICD-10-CM | POA: Diagnosis not present

## 2018-05-17 DIAGNOSIS — H40013 Open angle with borderline findings, low risk, bilateral: Secondary | ICD-10-CM | POA: Diagnosis not present

## 2018-05-17 DIAGNOSIS — H0102A Squamous blepharitis right eye, upper and lower eyelids: Secondary | ICD-10-CM | POA: Diagnosis not present

## 2018-06-12 ENCOUNTER — Ambulatory Visit (INDEPENDENT_AMBULATORY_CARE_PROVIDER_SITE_OTHER): Payer: Medicare Other | Admitting: Family Medicine

## 2018-06-12 ENCOUNTER — Other Ambulatory Visit: Payer: Self-pay

## 2018-06-12 ENCOUNTER — Encounter: Payer: Self-pay | Admitting: Family Medicine

## 2018-06-12 VITALS — BP 140/95 | HR 83 | Temp 98.8°F | Resp 16 | Ht 62.0 in | Wt 151.0 lb

## 2018-06-12 DIAGNOSIS — R0602 Shortness of breath: Secondary | ICD-10-CM

## 2018-06-12 DIAGNOSIS — Z131 Encounter for screening for diabetes mellitus: Secondary | ICD-10-CM

## 2018-06-12 DIAGNOSIS — Z86711 Personal history of pulmonary embolism: Secondary | ICD-10-CM

## 2018-06-12 DIAGNOSIS — R0609 Other forms of dyspnea: Secondary | ICD-10-CM

## 2018-06-12 DIAGNOSIS — E785 Hyperlipidemia, unspecified: Secondary | ICD-10-CM

## 2018-06-12 DIAGNOSIS — Z130 Encounter for screening for diseases of the blood and blood-forming organs and certain disorders involving the immune mechanism: Secondary | ICD-10-CM

## 2018-06-12 DIAGNOSIS — R0600 Dyspnea, unspecified: Secondary | ICD-10-CM

## 2018-06-12 DIAGNOSIS — I10 Essential (primary) hypertension: Secondary | ICD-10-CM

## 2018-06-12 LAB — CBC
HCT: 40.4 % (ref 36.0–46.0)
Hemoglobin: 13.5 g/dL (ref 12.0–15.0)
MCHC: 33.5 g/dL (ref 30.0–36.0)
MCV: 94.1 fl (ref 78.0–100.0)
Platelets: 260 10*3/uL (ref 150.0–400.0)
RBC: 4.3 Mil/uL (ref 3.87–5.11)
RDW: 12.9 % (ref 11.5–15.5)
WBC: 8 10*3/uL (ref 4.0–10.5)

## 2018-06-12 LAB — COMPREHENSIVE METABOLIC PANEL
ALT: 11 U/L (ref 0–35)
AST: 15 U/L (ref 0–37)
Albumin: 4.3 g/dL (ref 3.5–5.2)
Alkaline Phosphatase: 75 U/L (ref 39–117)
BUN: 14 mg/dL (ref 6–23)
CO2: 27 mEq/L (ref 19–32)
Calcium: 9.8 mg/dL (ref 8.4–10.5)
Chloride: 102 mEq/L (ref 96–112)
Creatinine, Ser: 0.78 mg/dL (ref 0.40–1.20)
GFR: 72.92 mL/min (ref >60.00)
Glucose, Bld: 81 mg/dL (ref 70–99)
Potassium: 4.4 mEq/L (ref 3.5–5.1)
Sodium: 139 mEq/L (ref 135–145)
Total Bilirubin: 0.5 mg/dL (ref 0.2–1.2)
Total Protein: 7.1 g/dL (ref 6.0–8.3)

## 2018-06-12 LAB — LIPID PANEL
Cholesterol: 275 mg/dL — ABNORMAL HIGH (ref 0–200)
HDL: 65 mg/dL (ref >39.00)
LDL Cholesterol: 171 mg/dL — ABNORMAL HIGH (ref 0–99)
NonHDL: 209.89
Total CHOL/HDL Ratio: 4
Triglycerides: 196 mg/dL — ABNORMAL HIGH (ref 0.0–149.0)
VLDL: 39.2 mg/dL (ref 0.0–40.0)

## 2018-06-12 LAB — TSH: TSH: 2.32 u[IU]/mL (ref 0.35–4.50)

## 2018-06-12 MED ORDER — ALBUTEROL SULFATE HFA 108 (90 BASE) MCG/ACT IN AERS: 2.0000 | INHALATION_SPRAY | Freq: Four times a day (QID) | RESPIRATORY_TRACT | 0 refills | Status: DC | PRN

## 2018-06-12 MED ORDER — LOSARTAN POTASSIUM 25 MG PO TABS: 25.0000 mg | ORAL_TABLET | Freq: Every day | ORAL | 6 refills | Status: DC

## 2018-06-12 NOTE — Patient Instructions (Addendum)
Good to see you again today!  I will be in touch with your labs asap  Try the albuterol inhaler prior to exercise and let me know if helpful.  If your D dimer is positive we will plan to do a CT scan to make sure no recurrent blood clot in your lungs.  If negative we should probably look at your heart- I will touch base with cardiology about what type of stress test to do- exercise vs something else  Your BP is a bit high- not terribly so, but we will start you on losartan 25 mg once a day to bring your pressure down  Let's plan to visit in 4-6 weeks for a BP recheck   Please let me know if any change or worsening in the meantime!

## 2018-06-12 NOTE — Progress Notes (Addendum)
[facility] [address] [place], [place] [address] [phone]  Date:  06/12/2018   Name:  [name] [name]   DOB:  23-Sep-1945   MRN:  [id number]  PCP:  Darreld Mclean, MD    Chief Complaint: Bloated (swelling in hands, feels bloated) and Urinary Incontinence   History of Present Illness:  [name] [name] is a 73 y.o. very pleasant female patient who presents with the following:  Here today for a recheck visit - last seen by myself in January of this year.   She has been well during the pandemic  Last year [name] [name] was suffering from memory loss and some behavior changes.  She was referred to neurology, and eventually diagnosed with brain tumor.  She had a craniotomy per Dr. Saintclair Halsted in September for a benign grade 1 meningioma, and then developed a post -op PE.  She was rehospitalized with her PE at the end of last September.  This is treated with Eliquis for 6 months.  She was able to stop eliquis since her PE She is using zetia-right now this is her only medication She is doing a lot of gardening at home and is really enjoying this.  She is feeling well and is in a good mood She is keeping busy with a lot of home projects No headaches -she feels perfectly well from her recent brain tumor It looks like they are planning a repeat brain MRI in August and a visit with Dr. Sherrilee Gilles   She feels like her longstanding shortness of breath is getting a bit worse.  She notes it more with exercise-especially walking up any incline. She has noted SOB for years, but worsening for the last month or so No chest pain or pressure Her breathing does hold her back from what she would like to do; for example, she is unable to cross  sand dunes and walk to the beach She is not sure if any wheezing- does not think so Not worse with allergens or temp change.  Seems to be just activity related Never tried an inhaler  She did do a stress test  in 10/2016 which was low risk   Blood pressure demonstrated a hypertensive response to exercise.  There was no ST segment deviation noted during stress. 1. Hypertensive BP response.  2. Below average exercise capacity.  3. No evidence for ischemia by ST segment analysis.   She notes that her blood pressure has been high on a few occasions recently -she is not currently treated for blood pressure  Patient Active Problem List   Diagnosis Date Noted  . Acute pulmonary embolism ([place]) 09/28/2017  . Dyspnea on exertion 09/28/2017  . Hypoxia 09/28/2017  . Meningioma ([place]) 09/07/2017  . FATIGUE 09/12/2009  . MYALGIA 10/31/2008  . DYSPNEA ON EXERTION 10/31/2008  . Hyperlipidemia 11/11/2006  . OTHER SPECIFIED ANEMIAS 11/11/2006  . GERD 11/11/2006  . Osteoporosis 11/11/2006  . IRRITABLE BOWEL SYNDROME, HX OF 10/20/2006    Past Medical History:  Diagnosis Date  . Cancer ([place])    skin cancer   . Hyperlipidemia   . Osteoporosis   . PONV (postoperative nausea and vomiting)    with breast surgery    Past Surgical History:  Procedure Laterality Date  . APPLICATION OF CRANIAL NAVIGATION N/A 09/07/2017   Procedure: APPLICATION OF CRANIAL NAVIGATION;  Surgeon: Kary Kos, MD;  Location: [facility];  Service: Neurosurgery;  Laterality: N/A;  . BREAST  ENHANCEMENT SURGERY    . BUNIONECTOMY    . COLONOSCOPY    . CRANIOTOMY N/A 09/07/2017   Procedure: CRAINIOTOMY BI CORONAL w/BRAINLAB;  Surgeon: Kary Kos, MD;  Location: [facility];  Service: Neurosurgery;  Laterality: N/A;  . EYE SURGERY Bilateral    cataract surgery  . SKIN CANCER EXCISION    . WISDOM TOOTH EXTRACTION      Social History   Tobacco Use  . Smoking status: Never Smoker  . Smokeless tobacco: Never Used  Substance Use Topics  . Alcohol use: No  . Drug use: No    Family History  Problem Relation Age of Onset  . Breast cancer Mother   . Heart disease Father     Allergies  Allergen Reactions  . Bee Venom Swelling     Medication list has been reviewed and updated.  Current Outpatient Medications on File Prior to Visit  Medication Sig Dispense Refill  . ezetimibe (ZETIA) 10 MG tablet Take 1 tablet (10 mg total) by mouth daily. 90 tablet 3   No current facility-administered medications on file prior to visit.     Review of Systems:  As per HPI- otherwise negative.  She is not sure about family history of HTN  No calf pain or swelling No PE or DVT except for last year Physical Examination: Vitals:   06/12/18 1122  BP: (!) 142/96  Pulse: 83  Resp: 16  Temp: 98.8 F (37.1 C)  SpO2: 98%   Vitals:   06/12/18 1122  Weight: 151 lb (68.5 kg)  Height: 5\' 2"  (1.575 m)   Body mass index is 27.62 kg/m. Ideal Body Weight: Weight in (lb) to have BMI = 25: 136.4  GEN: WDWN, NAD, Non-toxic, A & O x 3, normal weight, looks well HEENT: Atraumatic, Normocephalic. Neck supple. No masses, No LAD. Ears and Nose: No external deformity. CV: RRR, No M/G/R. No JVD. No thrill. No extra heart sounds. PULM: CTA B, no wheezes, crackles, rhonchi. No retractions. No resp. distress. No accessory muscle use. ABD: S, NT, ND, +BS. No rebound. No HSM. EXTR: No c/c/e.  No calf swelling or cords NEURO Normal gait.  PSYCH: Normally interactive. Conversant. Not depressed or anxious appearing.  Calm demeanor.  She has a small subcutaneous cyst of her right shoulder blade.  She is seeing dermatology the end of the month, I asked her to point this out  EKG: SR with non -specific t wave abnl.   Compared with EKF from 9/19 no concerning change is noted   BP Readings from Last 3 Encounters:  06/12/18 (!) 142/96  03/23/18 (!) 158/84  02/20/18 (!) 147/85    Assessment and Plan: Shortness of breath - Plan: EKG 12-Lead, CBC, D-Dimer, Quantitative, TSH, albuterol (VENTOLIN HFA) 108 (90 Base) MCG/ACT inhaler  Screening for diabetes mellitus - Plan: Comprehensive metabolic panel  Screening for deficiency  anemia  Dyslipidemia - Plan: Lipid panel  Essential hypertension - Plan: losartan (COZAAR) 25 MG tablet  Follow-up visit today.  [name] [name] endorses long-term shortness of breath, but worse the last month or so.  It is especially worse with activity.  No chest pain however.  She had a blood clot last year, will check a d-dimer.  If positive will need to repeat a CT angiogram.  If negative would like to get a stress test for her heart.  I did give her an albuterol inhaler to try, in case it might be helpful.  If stress test is done and is normal,  would then plan to have her see pulmonology  Blood pressure has been elevated at the last several office visits.  We will start her on losartan 25 mg.  Will plan further follow- up pending labs.   Signed Lamar Blinks, MD  Addendum 6/9 Received her labs, called but had to [facility]  Results for orders placed or performed in visit on 06/12/18  CBC  Result Value Ref Range   WBC 8.0 4.0 - 10.5 K/uL   RBC 4.30 3.87 - 5.11 Mil/uL   Platelets 260.0 150.0 - 400.0 K/uL   Hemoglobin 13.5 12.0 - 15.0 g/dL   HCT 40.4 36.0 - 46.0 %   MCV 94.1 78.0 - 100.0 fl   MCHC 33.5 30.0 - 36.0 g/dL   RDW 12.9 11.5 - 15.5 %  D-Dimer, Quantitative  Result Value Ref Range   D-Dimer, Quant 0.45 <0.50 mcg/mL FEU  Comprehensive metabolic panel  Result Value Ref Range   Sodium 139 135 - 145 mEq/L   Potassium 4.4 3.5 - 5.1 mEq/L   Chloride 102 96 - 112 mEq/L   CO2 27 19 - 32 mEq/L   Glucose, Bld 81 70 - 99 mg/dL   BUN 14 6 - 23 mg/dL   Creatinine, Ser 0.78 0.40 - 1.20 mg/dL   Total Bilirubin 0.5 0.2 - 1.2 mg/dL   Alkaline Phosphatase 75 39 - 117 U/L   AST 15 0 - 37 U/L   ALT 11 0 - 35 U/L   Total Protein 7.1 6.0 - 8.3 g/dL   Albumin 4.3 3.5 - 5.2 g/dL   Calcium 9.8 8.4 - 10.5 mg/dL   GFR 72.92 >60.00 mL/min  TSH  Result Value Ref Range   TSH 2.32 0.35 - 4.50 uIU/mL  Lipid panel  Result Value Ref Range   Cholesterol 275 (H) 0 - 200 mg/dL   Triglycerides 196.0  (H) 0.0 - 149.0 mg/dL   HDL 65.00 >39.00 mg/dL   VLDL 39.2 0.0 - 40.0 mg/dL   LDL Cholesterol 171 (H) 0 - 99 mg/dL   Total CHOL/HDL Ratio 4    NonHDL 209.89    Her d-dimer is negative, good news.  I will touch base with her pulmonologist to see if he thinks a CT is indicated anyway. Will obtain coronary CT angiogram for further eval of her coronary arteries  6/10 addnd Received message from Dr. Lake Bells, pulmonology: I think that the d-dimer will help you. If negative I wouldn't get a CT angiogram. However if the Echo shows pulmonary hypertension she should have a V/Q scan to look for chronic pulmonary emboli (not well imaged on CT angiogram).  Hope this helps.   Called her back, was able to speak with her Labs look good except for hyperlipidemia.  She is taking Zetia, has not been able to tolerate statins in the past. I explained that we will do a CT angiogram and also echocardiogram.  She will await these appointments.  If all normal, plan to have her follow-up with pulmonology.  She will alert me if any change or worsening in the meantime

## 2018-06-13 LAB — D-DIMER, QUANTITATIVE: D-Dimer, Quant: 0.45 mcg/mL FEU (ref <0.50)

## 2018-06-13 NOTE — Addendum Note (Signed)
Addended by: Lamar Blinks C on: 06/13/2018 01:18 PM   Modules accepted: Orders

## 2018-06-14 NOTE — Addendum Note (Signed)
Addended by: Lamar Blinks C on: 06/14/2018 09:17 AM   Modules accepted: Orders

## 2018-06-20 ENCOUNTER — Telehealth: Payer: Self-pay | Admitting: Family Medicine

## 2018-06-20 NOTE — Telephone Encounter (Signed)
[name] [name] with [facility] is calling in stating that PCP will need to fill out the [facility] authorization form before date of service, June 19th, for patient to be seen for her echocardiogram. Please advise. Fax number if needed is [phone].

## 2018-06-20 NOTE — Telephone Encounter (Signed)
Dr. Lorelei Pont do you know what this is about? I have received no paperwork recently on this patient?

## 2018-06-22 ENCOUNTER — Telehealth (HOSPITAL_COMMUNITY): Payer: Self-pay

## 2018-06-22 NOTE — Telephone Encounter (Signed)

## 2018-06-22 NOTE — Telephone Encounter (Signed)
[name] [name] with [facility] called to check on [employer] Form. Agent asked was the form sent over to office. Form was not sent over. [name] [name] will follow up and either fax form or call back. Form may have to be obtained for [employer]. Please advise.

## 2018-06-23 ENCOUNTER — Other Ambulatory Visit: Payer: Self-pay

## 2018-06-23 ENCOUNTER — Telehealth: Payer: Self-pay

## 2018-06-23 ENCOUNTER — Ambulatory Visit (HOSPITAL_COMMUNITY): Payer: Medicare Other | Attending: Cardiology

## 2018-06-23 DIAGNOSIS — Z9882 Breast implant status: Secondary | ICD-10-CM | POA: Diagnosis not present

## 2018-06-23 DIAGNOSIS — Z86711 Personal history of pulmonary embolism: Secondary | ICD-10-CM | POA: Insufficient documentation

## 2018-06-23 DIAGNOSIS — R0602 Shortness of breath: Secondary | ICD-10-CM | POA: Diagnosis not present

## 2018-06-23 DIAGNOSIS — E785 Hyperlipidemia, unspecified: Secondary | ICD-10-CM | POA: Diagnosis not present

## 2018-06-23 NOTE — Telephone Encounter (Signed)
Copied from [facility] [id number]. Topic: General - Inquiry >> Jun 13, 2018  3:21 PM Stovall, Shana A wrote: Reason for CRM:  pt called in and stated she rec'd a call and a vm but could not really hear or understand the vm.  She thinks it was something to due with a CT.  Per office referral coordinator is gone for the day.

## 2018-06-23 NOTE — Telephone Encounter (Signed)
Taken care of

## 2018-06-27 ENCOUNTER — Encounter: Payer: Self-pay | Admitting: Family Medicine

## 2018-06-27 NOTE — Telephone Encounter (Signed)
Pt has [employer] medicare and does not require a precert so I am uncertain what type of form it is. It sounds like it is something that the insurance company is requiring to be completed by the doctor prior to having the echo done.

## 2018-06-29 NOTE — Telephone Encounter (Signed)
Spoke with [facility] and apparently once I got authorization for the Echo that was all that was needed on our end. I guess [employer] just sends her authorization that it was a ECHO requested so nothing else is needed

## 2018-06-29 NOTE — Telephone Encounter (Signed)
Can you call heart care and ask about this?  She had the echo- is this about the coronary CT?

## 2018-07-04 DIAGNOSIS — Z85828 Personal history of other malignant neoplasm of skin: Secondary | ICD-10-CM | POA: Diagnosis not present

## 2018-07-04 DIAGNOSIS — C4441 Basal cell carcinoma of skin of scalp and neck: Secondary | ICD-10-CM | POA: Diagnosis not present

## 2018-07-04 DIAGNOSIS — D235 Other benign neoplasm of skin of trunk: Secondary | ICD-10-CM | POA: Diagnosis not present

## 2018-07-04 DIAGNOSIS — L570 Actinic keratosis: Secondary | ICD-10-CM | POA: Diagnosis not present

## 2018-07-04 DIAGNOSIS — C44519 Basal cell carcinoma of skin of other part of trunk: Secondary | ICD-10-CM | POA: Diagnosis not present

## 2018-07-04 DIAGNOSIS — D2261 Melanocytic nevi of right upper limb, including shoulder: Secondary | ICD-10-CM | POA: Diagnosis not present

## 2018-07-04 DIAGNOSIS — D485 Neoplasm of uncertain behavior of skin: Secondary | ICD-10-CM | POA: Diagnosis not present

## 2018-07-04 DIAGNOSIS — C44619 Basal cell carcinoma of skin of left upper limb, including shoulder: Secondary | ICD-10-CM | POA: Diagnosis not present

## 2018-07-12 ENCOUNTER — Ambulatory Visit: Payer: Medicare Other | Admitting: Nurse Practitioner

## 2018-07-13 ENCOUNTER — Ambulatory Visit (INDEPENDENT_AMBULATORY_CARE_PROVIDER_SITE_OTHER): Payer: Medicare Other

## 2018-07-13 ENCOUNTER — Encounter: Payer: Self-pay | Admitting: Nurse Practitioner

## 2018-07-13 ENCOUNTER — Ambulatory Visit: Payer: Medicare Other | Admitting: Nurse Practitioner

## 2018-07-13 ENCOUNTER — Other Ambulatory Visit: Payer: Self-pay

## 2018-07-13 VITALS — BP 124/70 | HR 77 | Temp 98.4°F | Ht 62.0 in | Wt 153.0 lb

## 2018-07-13 DIAGNOSIS — R053 Chronic cough: Secondary | ICD-10-CM

## 2018-07-13 DIAGNOSIS — R0609 Other forms of dyspnea: Secondary | ICD-10-CM

## 2018-07-13 DIAGNOSIS — R05 Cough: Secondary | ICD-10-CM | POA: Diagnosis not present

## 2018-07-13 DIAGNOSIS — R0602 Shortness of breath: Secondary | ICD-10-CM | POA: Diagnosis not present

## 2018-07-13 DIAGNOSIS — R0600 Dyspnea, unspecified: Secondary | ICD-10-CM

## 2018-07-13 NOTE — Progress Notes (Signed)
Reviewed, agree 

## 2018-07-13 NOTE — Patient Instructions (Signed)
Will order chest x ray and will call with results Start walking routine   May use albuterol as needed  Follow up: Follow up with Dr. Lake Bells in 1-2 months or sooner if needed

## 2018-07-13 NOTE — Assessment & Plan Note (Signed)
Patient presents today for a follow-up.  States that over the past few months she has noticed increase in shortness of breath with exertion.  She is able to complete activities of daily living independently chest pressure washing the handles, cleaning, and cooking.  She does notice with moderate exertion she is short of breath.  She states that she does live a sedentary lifestyle and never exercises.  She thinks that she has gained weight over the past couple years.  She does have an albuterol inhaler but does not use it.  PCP did order an echo recently which was normal.  PCP also checked a d-dimer which was normal.  Patient states that she does have a cough that is nonproductive.  She states that she does have reflux but does not take anything for this.   Discussed weight, sedentary life style, ? Allergies, and reflux could all play a role in shortness of breath and cough. Patient refuses any medications at this time and refuses to start an exercise routine at this time.   Patient Instructions  Will order chest x ray and will call with results Start walking routine   May use albuterol as needed  Follow up: Follow up with Dr. Lake Bells in 1-2 months or sooner if needed

## 2018-07-13 NOTE — Progress Notes (Signed)
@Patient  ID: [name] [name], female    DOB: 1945-06-27, 73 y.o.   MRN: [id number]  Chief Complaint  Patient presents with  . Shortness of Breath    Referring provider: Copland, Gay Filler, MD  HPI 73 year old female never smoker with history of PE (completed 6 months of Eliquis therapy) who is followed by Dr. Lake Bells PMH: Craniotomy to me for benign grade 1 meningioma  Tests: Chest imaging:  September 2019 CT chest images showing a bilateral pulmonary embolism, some mosaicism and atelectasis throughout the lungs but no other clear pulmonary parenchymal abnormality   Labs:  6/20 D-dimer: 0.45  Path:  09/2017 Brain mass: meningioma  Echo:  06/23/18 Echo: 1. The left ventricle has normal systolic function with an ejection fraction of 60-65%. The cavity size was normal. Left ventricular diastolic Doppler parameters are consistent with impaired relaxation.  2. The right ventricle has normal systolic function. The cavity was normal.  3. Trivial pericardial effusion is present.  4. The mitral valve is grossly normal.  5. The aortic valve is tricuspid. Mild thickening of the aortic valve. Aortic valve regurgitation is trivial by color flow Doppler. No stenosis of the aortic valve.  6. Normal LV function; mild diastolic dysfunction; trace AI.   September 2019 echocardiogram showed an LVEF of 65 to 70% no wall motion abnormalities.  Grade 1 diastolic dysfunction.  Normal RV size and function  Other imaging: September 2019 vascular ultrasound of the lower extremities shows no evidence of DVT  OV 07/13/18 - follow-up/shortness of breath Patient presents today for a follow-up.  States that over the past few months she has noticed increase in shortness of breath with exertion.  She is able to complete activities of daily living independently chest pressure washing the handles, cleaning, and cooking.  She does notice with moderate exertion she is short of breath.  She states that she  does live a sedentary lifestyle and never exercises.  She thinks that she has gained weight over the past couple years.  She does have an albuterol inhaler but does not use it.  PCP did order an echo recently which was normal.  PCP also checked a d-dimer which was normal.  Patient states that she does have a cough that is nonproductive.  She states that she does have reflux but does not take anything for this. Denies f/c/s, n/v/d, hemoptysis, PND, leg swelling.        Allergies  Allergen Reactions  . Bee Venom Swelling    Immunization History  Administered Date(s) Administered  . Influenza Whole 10/31/2008  . Influenza, High Dose Seasonal PF 10/15/2014, 10/29/2015, 09/29/2017  . Influenza, Seasonal, Injecte, Preservative Fre 10/03/2013  . Pneumococcal Conjugate-13 07/25/2017  . Tdap 07/25/2017  . Zoster 12/25/2008    Past Medical History:  Diagnosis Date  . Cancer ([place])    skin cancer   . Hyperlipidemia   . Osteoporosis   . PONV (postoperative nausea and vomiting)    with breast surgery    Tobacco History: Social History   Tobacco Use  Smoking Status Never Smoker  Smokeless Tobacco Never Used   Counseling given: Yes   Outpatient Encounter Medications as of 07/13/2018  Medication Sig  . albuterol (VENTOLIN HFA) 108 (90 Base) MCG/ACT inhaler Inhale 2 puffs into the lungs every 6 (six) hours as needed for wheezing or shortness of breath.  . ezetimibe (ZETIA) 10 MG tablet Take 1 tablet (10 mg total) by mouth daily.  Marland Kitchen losartan (COZAAR) 25 MG tablet Take 1  tablet (25 mg total) by mouth daily.   No facility-administered encounter medications on file as of 07/13/2018.      Review of Systems  Review of Systems  Constitutional: Negative.  Negative for chills and fever.  HENT: Negative.   Respiratory: Positive for cough and shortness of breath. Negative for wheezing.   Cardiovascular: Negative.  Negative for chest pain, palpitations and leg swelling.  Gastrointestinal:  Negative.   Allergic/Immunologic: Negative.   Neurological: Negative.   Psychiatric/Behavioral: Negative.        Physical Exam  BP 124/70 (BP Location: Left Arm, Patient Position: Sitting, Cuff Size: Normal)   Pulse 77   Temp 98.4 F (36.9 C)   Ht 5\' 2"  (1.575 m)   Wt 153 lb (69.4 kg)   SpO2 97%   BMI 27.98 kg/m   Wt Readings from Last 5 Encounters:  07/13/18 153 lb (69.4 kg)  06/12/18 151 lb (68.5 kg)  03/23/18 150 lb (68 kg)  02/20/18 151 lb 8 oz (68.7 kg)  01/09/18 152 lb (68.9 kg)     Physical Exam Vitals signs and nursing note reviewed.  Constitutional:      General: She is not in acute distress.    Appearance: She is well-developed.  Cardiovascular:     Rate and Rhythm: Normal rate and regular rhythm.  Pulmonary:     Effort: Pulmonary effort is normal.     Breath sounds: Normal breath sounds. No wheezing or rhonchi.  Musculoskeletal:     Right lower leg: No edema.  Neurological:     Mental Status: She is alert and oriented to person, place, and time.       Assessment & Plan:   Exertional dyspnea Patient presents today for a follow-up.  States that over the past few months she has noticed increase in shortness of breath with exertion.  She is able to complete activities of daily living independently chest pressure washing the handles, cleaning, and cooking.  She does notice with moderate exertion she is short of breath.  She states that she does live a sedentary lifestyle and never exercises.  She thinks that she has gained weight over the past couple years.  She does have an albuterol inhaler but does not use it.  PCP did order an echo recently which was normal.  PCP also checked a d-dimer which was normal.  Patient states that she does have a cough that is nonproductive.  She states that she does have reflux but does not take anything for this.   Discussed weight, sedentary life style, ? Allergies, and reflux could all play a role in shortness of breath and  cough. Patient refuses any medications at this time and refuses to start an exercise routine at this time.   Patient Instructions  Will order chest x ray and will call with results Start walking routine   May use albuterol as needed  Follow up: Follow up with Dr. Lake Bells in 1-2 months or sooner if needed       Fenton Foy, NP 07/13/2018

## 2018-07-17 ENCOUNTER — Other Ambulatory Visit: Payer: Self-pay | Admitting: Family Medicine

## 2018-07-17 DIAGNOSIS — E785 Hyperlipidemia, unspecified: Secondary | ICD-10-CM

## 2018-07-18 ENCOUNTER — Telehealth: Payer: Self-pay | Admitting: Nurse Practitioner

## 2018-07-18 NOTE — Telephone Encounter (Signed)
Called and spoke with pt. Pt is wanting to know if the results have come back from cxr that was performed. Since Kenney Houseman has left for the day, sending to Aaron Edelman who is APP of the day.  Aaron Edelman, please advise on the results of pt's cxr. Thanks!

## 2018-07-18 NOTE — Telephone Encounter (Signed)
Called and spoke with pt letting her know the results of the cxr. Pt verbalized understanding. Nothing further needed. 

## 2018-07-18 NOTE — Telephone Encounter (Signed)
Results are listed in patient's chart.  Chest x-ray results are showing:IMPRESSION: No active cardiopulmonary disease.  No acute changes were found on her chest x-ray.  Will route to [place] for future follow-up.   Wyn Quaker, FNP

## 2018-07-19 DIAGNOSIS — C4441 Basal cell carcinoma of skin of scalp and neck: Secondary | ICD-10-CM | POA: Diagnosis not present

## 2018-07-19 DIAGNOSIS — Z85828 Personal history of other malignant neoplasm of skin: Secondary | ICD-10-CM | POA: Diagnosis not present

## 2018-07-19 DIAGNOSIS — C44319 Basal cell carcinoma of skin of other parts of face: Secondary | ICD-10-CM | POA: Diagnosis not present

## 2018-07-19 DIAGNOSIS — L570 Actinic keratosis: Secondary | ICD-10-CM | POA: Diagnosis not present

## 2018-07-19 DIAGNOSIS — C44519 Basal cell carcinoma of skin of other part of trunk: Secondary | ICD-10-CM | POA: Diagnosis not present

## 2018-07-28 DIAGNOSIS — B078 Other viral warts: Secondary | ICD-10-CM | POA: Diagnosis not present

## 2018-07-28 DIAGNOSIS — L820 Inflamed seborrheic keratosis: Secondary | ICD-10-CM | POA: Diagnosis not present

## 2018-07-28 DIAGNOSIS — Z85828 Personal history of other malignant neoplasm of skin: Secondary | ICD-10-CM | POA: Diagnosis not present

## 2018-07-28 DIAGNOSIS — L570 Actinic keratosis: Secondary | ICD-10-CM | POA: Diagnosis not present

## 2018-08-08 DIAGNOSIS — C44319 Basal cell carcinoma of skin of other parts of face: Secondary | ICD-10-CM | POA: Diagnosis not present

## 2018-08-08 DIAGNOSIS — Z85828 Personal history of other malignant neoplasm of skin: Secondary | ICD-10-CM | POA: Diagnosis not present

## 2018-08-17 ENCOUNTER — Other Ambulatory Visit: Payer: Self-pay | Admitting: Radiation Therapy

## 2018-08-18 ENCOUNTER — Other Ambulatory Visit: Payer: Self-pay

## 2018-08-18 ENCOUNTER — Ambulatory Visit
Admission: RE | Admit: 2018-08-18 | Discharge: 2018-08-18 | Disposition: A | Discharge status: Home / Self Care | Payer: Medicare Other | Source: Ambulatory Visit | Attending: Internal Medicine | Admitting: Internal Medicine

## 2018-08-18 DIAGNOSIS — D329 Benign neoplasm of meninges, unspecified: Secondary | ICD-10-CM | POA: Diagnosis not present

## 2018-08-18 MED ORDER — GADOBENATE DIMEGLUMINE 529 MG/ML IV SOLN
14.0000 mL | Freq: Once | INTRAVENOUS | Status: AC | PRN
  Administered 2018-08-18: 14 mL via INTRAVENOUS

## 2018-08-28 ENCOUNTER — Inpatient Hospital Stay: Payer: Medicare Other | Attending: Internal Medicine | Admitting: Internal Medicine

## 2018-08-28 ENCOUNTER — Telehealth: Payer: Self-pay | Admitting: Internal Medicine

## 2018-08-28 ENCOUNTER — Other Ambulatory Visit: Payer: Self-pay

## 2018-08-28 VITALS — BP 132/66 | HR 96 | Temp 98.9°F | Resp 18 | Ht 62.0 in | Wt 153.4 lb

## 2018-08-28 DIAGNOSIS — R0602 Shortness of breath: Secondary | ICD-10-CM | POA: Diagnosis not present

## 2018-08-28 DIAGNOSIS — G9389 Other specified disorders of brain: Secondary | ICD-10-CM | POA: Insufficient documentation

## 2018-08-28 DIAGNOSIS — Z8249 Family history of ischemic heart disease and other diseases of the circulatory system: Secondary | ICD-10-CM | POA: Diagnosis not present

## 2018-08-28 DIAGNOSIS — D320 Benign neoplasm of cerebral meninges: Secondary | ICD-10-CM | POA: Insufficient documentation

## 2018-08-28 DIAGNOSIS — Z803 Family history of malignant neoplasm of breast: Secondary | ICD-10-CM | POA: Diagnosis not present

## 2018-08-28 DIAGNOSIS — Z7901 Long term (current) use of anticoagulants: Secondary | ICD-10-CM | POA: Diagnosis not present

## 2018-08-28 DIAGNOSIS — Z85828 Personal history of other malignant neoplasm of skin: Secondary | ICD-10-CM | POA: Insufficient documentation

## 2018-08-28 DIAGNOSIS — Z79899 Other long term (current) drug therapy: Secondary | ICD-10-CM | POA: Insufficient documentation

## 2018-08-28 DIAGNOSIS — R0989 Other specified symptoms and signs involving the circulatory and respiratory systems: Secondary | ICD-10-CM | POA: Insufficient documentation

## 2018-08-28 DIAGNOSIS — D329 Benign neoplasm of meninges, unspecified: Secondary | ICD-10-CM

## 2018-08-28 DIAGNOSIS — R51 Headache: Secondary | ICD-10-CM | POA: Insufficient documentation

## 2018-08-28 NOTE — Progress Notes (Signed)
[facility] [place], [place] [address] [phone]   Interval Evaluation  Date of Service: 08/28/18 Patient Name: [name] [name] Patient MRN: [id number] Patient DOB: 04-20-1945 Provider: Ventura Sellers, MD  Identifying Statement:  [name] [name] is a 73 y.o. 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 Saintclair Halsted.  Path is WHO grade I meningioma  Interval History:  [name] [name] presents for follow up after recent MRI brain.  She describes no new or progressive neurologic deficits.  She has no visual changes. No seizures or headaches.   H+P (10/19/17) Patient presented to medical attention initially in August after complaints of loss of short term memory, depressed affect, and new onset headaches.  MRI demonstrated 2 lesions c/w meningioma, one in the left frontal lobe and another tumor in the left cavernous sinus and orbital apex.  She denied visual symptoms including double vision.  Dr. Saintclair Halsted resected the left frontal tumor on 09/07/17, and following surgery she described clinical improvement.  Her memory is still "not 100%" but she feels more like her "normal self".  Patient and husband deny any behavioral sytmpoms or seizures.  Headaches have resolved.  Unfortunately 2-3 weeks after surgery she developed shortness of breath and was found to have a pulmonary embolism.  She was started on Eliquis and has been taking PRN oxygen, although respiratory symptoms have improved dramatically.    Medications: Current Outpatient Medications on File Prior to Visit  Medication Sig Dispense Refill  . albuterol (VENTOLIN HFA) 108 (90 Base) MCG/ACT inhaler Inhale 2 puffs into the lungs every 6 (six) hours as needed for wheezing or shortness of breath. 1  Inhaler 0  . ezetimibe (ZETIA) 10 MG tablet TAKE 1 TABLET(10 MG) BY MOUTH DAILY 90 tablet 1  . losartan (COZAAR) 25 MG tablet Take 1 tablet (25 mg total) by mouth daily. 30 tablet 6   No current facility-administered medications on file prior to visit.     Allergies:  Allergies  Allergen Reactions  . Bee Venom Swelling   Past Medical History:  Past Medical History:  Diagnosis Date  . Cancer ([place])    skin cancer   . Hyperlipidemia   . Osteoporosis   . PONV (postoperative nausea and vomiting)    with breast surgery   Past Surgical History:  Past Surgical History:  Procedure Laterality Date  . APPLICATION OF CRANIAL NAVIGATION N/A 09/07/2017   Procedure: APPLICATION OF CRANIAL NAVIGATION;  Surgeon: Kary Kos, MD;  Location: No Name;  Service: Neurosurgery;  Laterality: N/A;  . BREAST ENHANCEMENT SURGERY    . BUNIONECTOMY    . COLONOSCOPY    . CRANIOTOMY N/A 09/07/2017   Procedure: CRAINIOTOMY BI CORONAL w/BRAINLAB;  Surgeon: Kary Kos, MD;  Location: [facility];  Service: Neurosurgery;  Laterality: N/A;  . EYE SURGERY Bilateral    cataract surgery  . SKIN CANCER EXCISION    . WISDOM TOOTH EXTRACTION     Social History:  Social History   Socioeconomic History  . Marital status: Single    Spouse name: Not on file  . Number of children: Not on file  . Years of education: Not on file  . Highest education level: Not on file  Occupational History  . Not on file  Social Needs  . Financial resource strain:  Not on file  . Food insecurity    Worry: Not on file    Inability: Not on file  . Transportation needs    Medical: Not on file    Non-medical: Not on file  Tobacco Use  . Smoking status: Never Smoker  . Smokeless tobacco: Never Used  Substance and Sexual Activity  . Alcohol use: No  . Drug use: No  . Sexual activity: Not on file  Lifestyle  . Physical activity    Days per week: Not on file    Minutes per session: Not on file  . Stress: Not on file  Relationships  .  Social Herbalist on phone: Not on file    Gets together: Not on file    Attends religious service: Not on file    Active member of club or organization: Not on file    Attends meetings of clubs or organizations: Not on file    Relationship status: Not on file  . Intimate partner violence    Fear of current or ex partner: Not on file    Emotionally abused: Not on file    Physically abused: Not on file    Forced sexual activity: Not on file  Other Topics Concern  . Not on file  Social History Narrative   Lives home with home.  Not working.  Education:  HS grad.  [name] Fiance.     Family History:  Family History  Problem Relation Age of Onset  . Breast cancer Mother   . Heart disease Father     Review of Systems: Constitutional: Denies fevers, chills or abnormal weight loss Eyes: Denies blurriness of vision Ears, nose, mouth, throat, and face: Denies mucositis or sore throat Respiratory: +cough, dyspnea Cardiovascular: Denies palpitation, chest discomfort or lower extremity swelling Gastrointestinal:  Denies nausea, constipation, diarrhea GU: Denies dysuria or incontinence Skin: Denies abnormal skin rashes Neurological: Per HPI Musculoskeletal: Denies joint pain, back or neck discomfort. No decrease in ROM Behavioral/Psych: Denies anxiety, disturbance in thought content, and mood instability  Physical Exam: Vitals:   08/28/18 1051  BP: 132/66  Pulse: 96  Resp: 18  Temp: 98.9 F (37.2 C)  SpO2: 98%   KPS: 90. General: Alert, cooperative, pleasant, in no acute distress Head: Craniotomy scar noted, dry and intact. EENT: No conjunctival injection or scleral icterus. Oral mucosa moist Lungs: Resp effort normal Cardiac: Regular rate and rhythm Abdomen: Soft, non-distended abdomen Skin: No rashes cyanosis or petechiae. Extremities: No clubbing or edema  Neurologic Exam: Mental Status: Awake, alert, attentive to examiner. Oriented to self and  environment. Language is fluent with intact comprehension.  Cranial Nerves: Visual acuity is grossly normal. Visual fields are full. Extra-ocular movements intact. No ptosis. Face is symmetric, tongue midline. Motor: Tone and bulk are normal. Power is full in both arms and legs. Reflexes are symmetric, no pathologic reflexes present. Intact finger to nose bilaterally Sensory: Intact to light touch and temperature Gait: Normal and tandem gait is normal.   Labs: I have reviewed the data as listed    Component Value Date/Time   NA 139 06/12/2018 1154   NA 141 07/15/2015   NA 141 07/15/2015   K 4.4 06/12/2018 1154   CL 102 06/12/2018 1154   CO2 27 06/12/2018 1154   GLUCOSE 81 06/12/2018 1154   BUN 14 06/12/2018 1154   BUN 22 (A) 07/15/2015   BUN 22 (A) 07/15/2015   CREATININE 0.78 06/12/2018 1154   CALCIUM  9.8 06/12/2018 1154   PROT 7.1 06/12/2018 1154   ALBUMIN 4.3 06/12/2018 1154   AST 15 06/12/2018 1154   ALT 11 06/12/2018 1154   ALKPHOS 75 06/12/2018 1154   BILITOT 0.5 06/12/2018 1154   GFRNONAA >60 09/29/2017 0639   GFRAA >60 09/29/2017 0639   Lab Results  Component Value Date   WBC 8.0 06/12/2018   NEUTROABS 7.5 (H) 09/28/2017   HGB 13.5 06/12/2018   HCT 40.4 06/12/2018   MCV 94.1 06/12/2018   PLT 260.0 06/12/2018   Imaging:  [facility] Clinician Interpretation: I have personally reviewed the CNS images as listed.  My interpretation, in the context of the patient's clinical presentation, is progressive disease  Mr Jeri Cos Wo Contrast  Result Date: 08/18/2018 CLINICAL DATA:  Follow-up meningiomas.  Craniotomy on 09/07/2017. Creatinine was obtained on site at [facility] at [address] Results: Creatinine 0.8 mg/dL. EXAM: MRI HEAD WITHOUT AND WITH CONTRAST TECHNIQUE: Multiplanar, multiecho pulse sequences of the brain and surrounding structures were obtained without and with intravenous contrast. CONTRAST:  1mL MULTIHANCE GADOBENATE DIMEGLUMINE 529 MG/ML IV  SOLN COMPARISON:  02/13/2018 FINDINGS: Brain: Sequelae of frontal craniotomy are again identified for resection of an anterior left parafalcine meningioma. Chronic blood products are present in the left parafalcine resection cavity, and there is a persistent small extra-axial fluid collection subjacent to the craniotomy without significant mass effect. Encephalomalacia and gliosis are again seen in the left greater than right frontal lobes and have mildly progressed from prior. Dural enhancement subjacent to the craniotomy has mildly increased in thickness overall, and there is a 6 mm focus of nodular enhancement on the left (series 14, image 25). Enhancing mass filling and expanding the left cavernous sinus with extension into the left orbital apex, left sphenoid sinus, sella, and suprasellar cistern has not significantly changed in size upon remeasurement, measuring 3.3 x 3.4 x 2.9 cm (AP by transverse by craniocaudal). The infundibulum is deviated to the right. There is no evidence of acute infarct. Small foci of T2 hyperintensity scattered in the cerebral white matter bilaterally are unchanged and nonspecific but compatible with mild chronic small vessel ischemic disease. There is no age advanced cerebral atrophy. Vascular: Major intracranial vascular flow voids are preserved. Encasement of the left cavernous and proximal supraclinoid ICA which remains patent. Skull and upper cervical spine: Frontal craniotomy. Unchanged 9 mm enhancing focus in the right parietal bone. Sinuses/Orbits: Bilateral cataract extraction. No significant inflammatory sinus disease. Other: None. IMPRESSION: 1. Increased dural enhancement subjacent to the frontal craniotomy, likely postoperative though with a 6 mm nodular focus for which recurrent tumor is not excluded. Close attention on continued follow-up. 2. Unchanged skull base meningioma centered in the left cavernous sinus. Electronically Signed   By: Logan Bores M.D.   On:  08/18/2018 19:48     Assessment/Plan 1. Meningioma [facility])  Ms. Bai is clinically stable today. MRI demonstrates small nodular focus associated with modest dural thickening adjacent to the left frontal resection cavity is clear.  The untreated cavernous sinus tumor continues to be stable.  After discussion regarding treatment pathways, we decided to repeat an MRI brain in 3 months to reassess these changes.  If further progression is visualized, radiotherapy could potentially be used to treat both lesions concurrently.  She understands to call us if she develops any new or progressive neurologic symptoms in the interim, which would prompt [place] to repeat imaging over a shorter interval.  We appreciate the opportunity to participate in  the care of [facility].  She should return to clinic following next MRI brain for review.  All questions were answered. The patient knows to call the clinic with any problems, questions or concerns. No barriers to learning were detected.  The total time spent in the encounter was 40 minutes and more than 50% was on counseling and review of test results   Ventura Sellers, MD Medical Director of Neuro-Oncology [facility] at [place] 08/28/18 11:03 AM

## 2018-08-28 NOTE — Telephone Encounter (Signed)
Scheduled appt per 8/24 los. ° °Printed calendar and avs. °

## 2018-08-29 DIAGNOSIS — M6281 Muscle weakness (generalized): Secondary | ICD-10-CM | POA: Diagnosis not present

## 2018-08-29 DIAGNOSIS — M545 Low back pain: Secondary | ICD-10-CM | POA: Diagnosis not present

## 2018-08-29 DIAGNOSIS — R262 Difficulty in walking, not elsewhere classified: Secondary | ICD-10-CM | POA: Diagnosis not present

## 2018-08-29 DIAGNOSIS — M419 Scoliosis, unspecified: Secondary | ICD-10-CM | POA: Diagnosis not present

## 2018-08-30 ENCOUNTER — Ambulatory Visit: Payer: Self-pay

## 2018-08-30 NOTE — Telephone Encounter (Signed)
Appt scheduled

## 2018-08-30 NOTE — Telephone Encounter (Signed)
Returned call to patient who states that she has had Diarrhea for 2 months. She states that it will come and go but mostly she has diarrhea.  She has tried multiple OTC medications. She has tried probiotics IB guard. She states that she has a mild pain that crosses her abdomin at just below her waste line. Pt denies symptoms of dehydration.  She is eating and drinking.  She has not last weight. She has not recently taken an antibiotic.  She has a Hx of IBS but states it has not been a problem for years. Care advice read to patient. Patient verbalized understanding. Call transferred to office for scheduling.  Reason for Disposition . [1] SEVERE diarrhea (e.g., 7 or more times / day more than normal) AND [2] present > 24 hours (1 day)  Answer Assessment - Initial Assessment Questions 1. DIARRHEA SEVERITY: "How bad is the diarrhea?" "How many extra stools have you had in the past 24 hours than normal?"    - NO DIARRHEA (SCALE 0)   - MILD (SCALE 1-3): Few loose or mushy BMs; increase of 1-3 stools over normal daily number of stools; mild increase in ostomy output.   -  MODERATE (SCALE 4-7): Increase of 4-6 stools daily over normal; moderate increase in ostomy output. * SEVERE (SCALE 8-10; OR 'WORST POSSIBLE'): Increase of 7 or more stools daily over normal; moderate increase in ostomy output; incontinence.     5 2. ONSET: "When did the diarrhea begin?"      2 months 3. BM CONSISTENCY: "How loose or watery is the diarrhea?"     slurry 4. VOMITING: "Are you also vomiting?" If so, ask: "How many times in the past 24 hours?"     no 5. ABDOMINAL PAIN: "Are you having any abdominal pain?" If yes: "What does it feel like?" (e.g., crampy, dull, intermittent, constant)      Pain across stomach just at waste line 6. ABDOMINAL PAIN SEVERITY: If present, ask: "How bad is the pain?"  (e.g., Scale 1-10; mild, moderate, or severe)   - MILD (1-3): doesn't interfere with normal activities, abdomen soft and not  tender to touch    - MODERATE (4-7): interferes with normal activities or awakens from sleep, tender to touch    - SEVERE (8-10): excruciating pain, doubled over, unable to do any normal activities       Mild 7. ORAL INTAKE: If vomiting, "Have you been able to drink liquids?" "How much fluids have you had in the past 24 hours?"     Drinking normal 8. HYDRATION: "Any signs of dehydration?" (e.g., dry mouth [not just dry lips], too weak to stand, dizziness, new weight loss) "When did you last urinate?"    none 9. EXPOSURE: "Have you traveled to a foreign country recently?" "Have you been exposed to anyone with diarrhea?" "Could you have eaten any food that was spoiled?"     no 10. ANTIBIOTIC USE: "Are you taking antibiotics now or have you taken antibiotics in the past 2 months?"    no 11. OTHER SYMPTOMS: "Do you have any other symptoms?" (e.g., fever, blood in stool)     no 12. PREGNANCY: "Is there any chance you are pregnant?" "When was your last menstrual period?"      N/A  Protocols used: DIARRHEA-A-AH

## 2018-08-31 ENCOUNTER — Encounter: Payer: Self-pay | Admitting: Family Medicine

## 2018-08-31 ENCOUNTER — Ambulatory Visit (INDEPENDENT_AMBULATORY_CARE_PROVIDER_SITE_OTHER): Payer: Medicare Other | Admitting: Family Medicine

## 2018-08-31 ENCOUNTER — Other Ambulatory Visit: Payer: Self-pay

## 2018-08-31 DIAGNOSIS — R197 Diarrhea, unspecified: Secondary | ICD-10-CM | POA: Diagnosis not present

## 2018-08-31 NOTE — Progress Notes (Signed)
[facility] at [facility] [address] [place], [place] [address] [id number] [phone]  Date:  08/31/2018   Name:  [name] [name]   DOB:  04-Mar-1945   MRN:  [id number]  PCP:  Darreld Mclean, MD    Chief Complaint: No chief complaint on file.   History of Present Illness:  [name] [name] is a 73 y.o. very pleasant female patient who presents with the following:  Virtual visit today to discuss stomach issues Pt is at home, provider is at office Pt ID confirmed with 2 factors, she gives consent for virtual visit today   She notes dx with IBS years ago Sx seemed to return off and on for about 2 months No vomiting, just diarrhea Yesterday she had a lot of diarrhea and fecal urgency  No nausea She may get some cramping intermittently in her abdomen, but not pain  No fever No blood in her stool  We did cologuard for her last year   She has tried some probiotics- no imodium so far  No recent abx use that she can recall No recent travel, no exposure to bad food or water     Patient Active Problem List   Diagnosis Date Noted  . Acute pulmonary embolism ([place]) 09/28/2017  . Dyspnea on exertion 09/28/2017  . Hypoxia 09/28/2017  . Meningioma ([place]) 09/07/2017  . FATIGUE 09/12/2009  . MYALGIA 10/31/2008  . Exertional dyspnea 10/31/2008  . Hyperlipidemia 11/11/2006  . OTHER SPECIFIED ANEMIAS 11/11/2006  . GERD 11/11/2006  . Osteoporosis 11/11/2006  . IRRITABLE BOWEL SYNDROME, HX OF 10/20/2006    Past Medical History:  Diagnosis Date  . Cancer ([place])    skin cancer   . Hyperlipidemia   . Osteoporosis   . PONV (postoperative nausea and vomiting)    with breast surgery    Past Surgical History:  Procedure Laterality Date  . APPLICATION OF CRANIAL NAVIGATION N/A 09/07/2017   Procedure: APPLICATION OF CRANIAL NAVIGATION;  Surgeon: Kary Kos, MD;  Location: [facility];  Service: Neurosurgery;  Laterality: N/A;  . BREAST  ENHANCEMENT SURGERY    . BUNIONECTOMY    . COLONOSCOPY    . CRANIOTOMY N/A 09/07/2017   Procedure: CRAINIOTOMY BI CORONAL w/BRAINLAB;  Surgeon: Kary Kos, MD;  Location: [facility];  Service: Neurosurgery;  Laterality: N/A;  . EYE SURGERY Bilateral    cataract surgery  . SKIN CANCER EXCISION    . WISDOM TOOTH EXTRACTION      Social History   Tobacco Use  . Smoking status: Never Smoker  . Smokeless tobacco: Never Used  Substance Use Topics  . Alcohol use: No  . Drug use: No    Family History  Problem Relation Age of Onset  . Breast cancer Mother   . Heart disease Father     Allergies  Allergen Reactions  . Bee Venom Swelling    Medication list has been reviewed and updated.  Current Outpatient Medications on File Prior to Visit  Medication Sig Dispense Refill  . albuterol (VENTOLIN HFA) 108 (90 Base) MCG/ACT inhaler Inhale 2 puffs into the lungs every 6 (six) hours as needed for wheezing or shortness of breath. (Patient not taking: Reported on 08/28/2018) 1 Inhaler 0  . ezetimibe (ZETIA) 10 MG tablet TAKE 1 TABLET(10 MG) BY MOUTH DAILY 90 tablet 1  . losartan (COZAAR) 25 MG tablet Take 1 tablet (25 mg total) by mouth daily. 30 tablet 6   No current facility-administered  medications on file prior to visit.     Review of Systems:  As per HPI- otherwise negative. No fever or chills No CP or SOB  Physical Examination: There were no vitals filed for this visit. There were no vitals filed for this visit. There is no height or weight on file to calculate BMI. Ideal Body Weight:    Spoke with pt on the phone, she sounds well No cough, SOB or wheezing is noted    Assessment and Plan: Diarrhea, unspecified type  Pt notes diarrhea and loose stools for 2 months As her sx are longstanding doubt covid 19 infection She is not in any distress, no vomiting Suggested that she try imodium otc Made her an appt to see me in person on Monday   Signed Lamar Blinks, MD

## 2018-09-02 NOTE — Progress Notes (Signed)
[facility] at [facility] [address] [place], [place] [address] [phone]  Date:  09/04/2018   Name:  [name] [name]   DOB:  12-13-1945   MRN:  [id number]  PCP:  Darreld Mclean, MD    Chief Complaint: Diarrhea (brought a stool sample, tried immodium)   History of Present Illness:  [name] [name] is a 73 y.o. very pleasant female patient who presents with the following:  Seen in person today to follow-up on recent virtual visit, 8/27 At that time that he had concern of possible recurrent IBS symptoms over the last 2 months.  She had diarrhea and fecal urgency No fever or blood in her stool.  No recent antibiotic use, travel, exposure to unusual foods or non-city water  She has tried probiotics, at our virtual visit I recommended that she try Imodium as well She took just one dose prior to a DDS appt this past Friday- today is monday She notes that imodium has been helpful for her and in fact her sx see to have resolved She had one BM yesterday and one today-   Mammogram appears to be due- ordered for her  Offer flu shot- do today Can also offer Pneumovax booster- would like to do today  Encouraged shingrix at drug store C met and CBC in June, normal  She was dx with a meningioma a year ago, underwent craniotomy and resection by Dr Saintclair Halsted in September of 2019. Post op course was complicated by a PE- she completed her course of eliquis  She saw oncology earlier this month- they plan to do a follow-up MRI of her brain in 3 months.   She brings [place] a stool sample today- it appears to be solid, will not be suitable for C diff eval   She has had skin cancers removed from her trunk  She had noted some intermittent pain in her belly for a month or so, this is also resolved since the diarrhea subsided   Wt Readings from Last 3 Encounters:  09/04/18 155 lb (70.3 kg)  08/28/18 153 lb 6.4 oz (69.6 kg)  07/13/18 153 lb (69.4 kg)    Her weight a year ago was 153  BP Readings from Last 3 Encounters:  09/04/18 130/80  08/28/18 132/66  07/13/18 124/70     Patient Active Problem List   Diagnosis Date Noted  . Acute pulmonary embolism ([place]) 09/28/2017  . Dyspnea on exertion 09/28/2017  . Hypoxia 09/28/2017  . Meningioma ([place]) 09/07/2017  . FATIGUE 09/12/2009  . MYALGIA 10/31/2008  . Exertional dyspnea 10/31/2008  . Hyperlipidemia 11/11/2006  . OTHER SPECIFIED ANEMIAS 11/11/2006  . GERD 11/11/2006  . Osteoporosis 11/11/2006  . IRRITABLE BOWEL SYNDROME, HX OF 10/20/2006    Past Medical History:  Diagnosis Date  . Cancer ([place])    skin cancer   . Hyperlipidemia   . Osteoporosis   . PONV (postoperative nausea and vomiting)    with breast surgery    Past Surgical History:  Procedure Laterality Date  . APPLICATION OF CRANIAL NAVIGATION N/A 09/07/2017   Procedure: APPLICATION OF CRANIAL NAVIGATION;  Surgeon: Kary Kos, MD;  Location: [facility];  Service: Neurosurgery;  Laterality: N/A;  . BREAST ENHANCEMENT SURGERY    . BUNIONECTOMY    . COLONOSCOPY    . CRANIOTOMY N/A 09/07/2017   Procedure: CRAINIOTOMY BI CORONAL w/BRAINLAB;  Surgeon: Kary Kos, MD;  Location: [facility];  Service: Neurosurgery;  Laterality: N/A;  .  EYE SURGERY Bilateral    cataract surgery  . SKIN CANCER EXCISION    . WISDOM TOOTH EXTRACTION      Social History   Tobacco Use  . Smoking status: Never Smoker  . Smokeless tobacco: Never Used  Substance Use Topics  . Alcohol use: No  . Drug use: No    Family History  Problem Relation Age of Onset  . Breast cancer Mother   . Heart disease Father     Allergies  Allergen Reactions  . Bee Venom Swelling    Medication list has been reviewed and updated.  Current Outpatient Medications on File Prior to Visit  Medication Sig Dispense Refill  . albuterol (VENTOLIN HFA) 108 (90 Base) MCG/ACT inhaler Inhale 2 puffs into the lungs every 6 (six) hours as needed for wheezing or shortness  of breath. 1 Inhaler 0  . ezetimibe (ZETIA) 10 MG tablet TAKE 1 TABLET(10 MG) BY MOUTH DAILY 90 tablet 1  . losartan (COZAAR) 25 MG tablet Take 1 tablet (25 mg total) by mouth daily. 30 tablet 6   No current facility-administered medications on file prior to visit.     Review of Systems:  As per HPI- otherwise negative. No CP or SOB She is frustrated by difficulty with weight loss   Physical Examination: Vitals:   09/04/18 1104 09/04/18 1127  BP: (!) 130/98 130/80  Pulse: 91   Resp: 16   Temp: (!) 96.9 F (36.1 C)   SpO2: 98%    Vitals:   09/04/18 1104  Weight: 155 lb (70.3 kg)  Height: '5\' 2"'$  (1.575 m)   Body mass index is 28.35 kg/m. Ideal Body Weight: Weight in (lb) to have BMI = 25: 136.4  GEN: WDWN, NAD, Non-toxic, A & O x 3, looks well, overweight  HEENT: Atraumatic, Normocephalic. Neck supple. No masses, No LAD. Ears and Nose: No external deformity. CV: RRR, No M/G/R. No JVD. No thrill. No extra heart sounds. PULM: CTA B, no wheezes, crackles, rhonchi. No retractions. No resp. distress. No accessory muscle use. ABD: S, NT, ND, +BS. No rebound. No HSM. Benign belly today  EXTR: No c/c/e NEURO Normal gait.  PSYCH: Normally interactive. Conversant. Not depressed or anxious appearing.  Calm demeanor.   She brings in a stool sample- it appears solid and normal  Assessment and Plan: Diarrhea, unspecified type  Screening for breast cancer - Plan: MM 3D SCREEN BREAST BILATERAL  Immunization due - Plan: Pneumococcal polysaccharide vaccine 23-valent greater than or equal to 2yo subcutaneous/IM, Flu Vaccine QUAD High Dose(Fluad)  Following up on diarrhea today However sx are now resolved following one dose of imodium 3 days ago We had planned to check c diff but no longer possible as no diarrhea Asked her to alert me if sx return mammo ordered Update immunizations   Signed Lamar Blinks, MD

## 2018-09-04 ENCOUNTER — Other Ambulatory Visit: Payer: Self-pay

## 2018-09-04 ENCOUNTER — Ambulatory Visit: Payer: Medicare Other | Admitting: Family Medicine

## 2018-09-04 ENCOUNTER — Encounter: Payer: Self-pay | Admitting: Family Medicine

## 2018-09-04 VITALS — BP 130/80 | HR 91 | Temp 96.9°F | Resp 16 | Ht 62.0 in | Wt 155.0 lb

## 2018-09-04 DIAGNOSIS — R197 Diarrhea, unspecified: Secondary | ICD-10-CM | POA: Diagnosis not present

## 2018-09-04 DIAGNOSIS — Z1239 Encounter for other screening for malignant neoplasm of breast: Secondary | ICD-10-CM

## 2018-09-04 DIAGNOSIS — Z23 Encounter for immunization: Secondary | ICD-10-CM

## 2018-09-04 NOTE — Patient Instructions (Signed)
Good to see you again today!  As your stools seem to have come back to normal, let's keep an eye on this for now.  Please do let me know if your symptoms return  Flu shot and pneumonia booster today Please get the shingles vaccine through your pharmacy at your convenience  Take care!

## 2018-09-22 DIAGNOSIS — Z1231 Encounter for screening mammogram for malignant neoplasm of breast: Secondary | ICD-10-CM | POA: Diagnosis not present

## 2018-09-22 DIAGNOSIS — M25552 Pain in left hip: Secondary | ICD-10-CM | POA: Diagnosis not present

## 2018-09-22 DIAGNOSIS — K589 Irritable bowel syndrome without diarrhea: Secondary | ICD-10-CM | POA: Diagnosis not present

## 2018-09-22 DIAGNOSIS — M8589 Other specified disorders of bone density and structure, multiple sites: Secondary | ICD-10-CM | POA: Diagnosis not present

## 2018-09-22 LAB — HM DEXA SCAN

## 2018-09-22 LAB — HM MAMMOGRAPHY

## 2018-09-25 ENCOUNTER — Encounter: Payer: Self-pay | Admitting: Family Medicine

## 2018-10-02 ENCOUNTER — Encounter: Payer: Self-pay | Admitting: Family Medicine

## 2018-12-13 ENCOUNTER — Other Ambulatory Visit: Payer: Self-pay | Admitting: Radiation Therapy

## 2018-12-15 ENCOUNTER — Other Ambulatory Visit: Payer: Self-pay

## 2018-12-15 ENCOUNTER — Ambulatory Visit
Admission: RE | Admit: 2018-12-15 | Discharge: 2018-12-15 | Disposition: A | Discharge status: Home / Self Care | Payer: Medicare Other | Source: Ambulatory Visit | Attending: Internal Medicine | Admitting: Internal Medicine

## 2018-12-15 DIAGNOSIS — D329 Benign neoplasm of meninges, unspecified: Secondary | ICD-10-CM | POA: Diagnosis not present

## 2018-12-15 MED ORDER — GADOBENATE DIMEGLUMINE 529 MG/ML IV SOLN
14.0000 mL | Freq: Once | INTRAVENOUS | Status: AC | PRN
  Administered 2018-12-15: 14 mL via INTRAVENOUS

## 2018-12-18 ENCOUNTER — Telehealth: Payer: Self-pay | Admitting: Internal Medicine

## 2018-12-18 ENCOUNTER — Other Ambulatory Visit: Payer: Self-pay

## 2018-12-18 ENCOUNTER — Inpatient Hospital Stay: Payer: Medicare Other | Attending: Internal Medicine | Admitting: Internal Medicine

## 2018-12-18 VITALS — BP 141/72 | HR 84 | Temp 98.5°F | Resp 18 | Ht 62.0 in | Wt 159.2 lb

## 2018-12-18 DIAGNOSIS — D320 Benign neoplasm of cerebral meninges: Secondary | ICD-10-CM | POA: Diagnosis not present

## 2018-12-18 DIAGNOSIS — R0989 Other specified symptoms and signs involving the circulatory and respiratory systems: Secondary | ICD-10-CM | POA: Diagnosis not present

## 2018-12-18 DIAGNOSIS — Z79899 Other long term (current) drug therapy: Secondary | ICD-10-CM | POA: Diagnosis not present

## 2018-12-18 DIAGNOSIS — Z8249 Family history of ischemic heart disease and other diseases of the circulatory system: Secondary | ICD-10-CM | POA: Insufficient documentation

## 2018-12-18 DIAGNOSIS — D329 Benign neoplasm of meninges, unspecified: Secondary | ICD-10-CM

## 2018-12-18 DIAGNOSIS — Z803 Family history of malignant neoplasm of breast: Secondary | ICD-10-CM | POA: Insufficient documentation

## 2018-12-18 DIAGNOSIS — Z7901 Long term (current) use of anticoagulants: Secondary | ICD-10-CM | POA: Diagnosis not present

## 2018-12-18 DIAGNOSIS — G9389 Other specified disorders of brain: Secondary | ICD-10-CM | POA: Diagnosis not present

## 2018-12-18 DIAGNOSIS — Z85828 Personal history of other malignant neoplasm of skin: Secondary | ICD-10-CM | POA: Insufficient documentation

## 2018-12-18 DIAGNOSIS — R0602 Shortness of breath: Secondary | ICD-10-CM | POA: Insufficient documentation

## 2018-12-18 NOTE — Telephone Encounter (Signed)
No los per 12/14 

## 2018-12-18 NOTE — Progress Notes (Signed)
[facility] at [facility] [place], [place] [address] [phone]   Interval Evaluation  Date of Service: 12/18/18 Patient Name: [name] [name] Patient MRN: [id number] Patient DOB: June 25, 1945 Provider: Ventura Sellers, MD  Identifying Statement:  [name] [name] is a 73 y.o. 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 Saintclair Halsted.  Path is WHO grade I meningioma  Interval History:  [name] [name] presents for follow up after recent MRI brain.  She describes no new or progressive neurologic deficits.  She has no visual changes. No seizures or headaches.   H+P (10/19/17) Patient presented to medical attention initially in August after complaints of loss of short term memory, depressed affect, and new onset headaches.  MRI demonstrated 2 lesions c/w meningioma, one in the left frontal lobe and another tumor in the left cavernous sinus and orbital apex.  She denied visual symptoms including double vision.  Dr. Saintclair Halsted resected the left frontal tumor on 09/07/17, and following surgery she described clinical improvement.  Her memory is still "not 100%" but she feels more like her "normal self".  Patient and husband deny any behavioral sytmpoms or seizures.  Headaches have resolved.  Unfortunately 2-3 weeks after surgery she developed shortness of breath and was found to have a pulmonary embolism.  She was started on Eliquis and has been taking PRN oxygen, although respiratory symptoms have improved dramatically.    Medications: Current Outpatient Medications on File Prior to Visit  Medication Sig Dispense Refill  . ezetimibe (ZETIA) 10 MG tablet TAKE 1 TABLET(10 MG) BY MOUTH DAILY 90 tablet 1   No current facility-administered medications on file prior to visit.     Allergies:  Allergies  Allergen Reactions  . Bee Venom Swelling  . Losartan Itching   Past Medical History:  Past Medical History:  Diagnosis Date  . Cancer ([place])    skin cancer   . Hyperlipidemia   . Osteoporosis   . PONV (postoperative nausea and vomiting)    with breast surgery   Past Surgical History:  Past Surgical History:  Procedure Laterality Date  . APPLICATION OF CRANIAL NAVIGATION N/A 09/07/2017   Procedure: APPLICATION OF CRANIAL NAVIGATION;  Surgeon: Kary Kos, MD;  Location: [facility];  Service: Neurosurgery;  Laterality: N/A;  . BREAST ENHANCEMENT SURGERY    . BUNIONECTOMY    . COLONOSCOPY    . CRANIOTOMY N/A 09/07/2017   Procedure: CRAINIOTOMY BI CORONAL w/BRAINLAB;  Surgeon: Kary Kos, MD;  Location: [facility];  Service: Neurosurgery;  Laterality: N/A;  . EYE SURGERY Bilateral    cataract surgery  . SKIN CANCER EXCISION    . WISDOM TOOTH EXTRACTION     Social History:  Social History   Socioeconomic History  . Marital status: Single    Spouse name: Not on file  . Number of children: Not on file  . Years of education: Not on file  . Highest education level: Not on file  Occupational History  . Not on file  Tobacco Use  . Smoking status: Never Smoker  . Smokeless tobacco: Never Used  Substance and Sexual Activity  . Alcohol use: No  . Drug use: No  . Sexual activity: Not on file  Other Topics Concern  . Not on file  Social History Narrative   Lives home with  home.  Not working.  Education:  HS grad.  [name] [name] Fiance.     Social Determinants of Health   Financial Resource Strain:   . Difficulty of Paying Living Expenses: Not on file  Food Insecurity:   . Worried About Charity fundraiser in the Last Year: Not on file  . Ran Out of Food in the Last Year: Not on file  Transportation Needs:   . Lack of Transportation (Medical): Not on file  . Lack of Transportation (Non-Medical): Not on file  Physical Activity:   . Days of Exercise per Week:  Not on file  . Minutes of Exercise per Session: Not on file  Stress:   . Feeling of Stress : Not on file  Social Connections:   . Frequency of Communication with Friends and Family: Not on file  . Frequency of Social Gatherings with Friends and Family: Not on file  . Attends Religious Services: Not on file  . Active Member of Clubs or Organizations: Not on file  . Attends Archivist Meetings: Not on file  . Marital Status: Not on file  Intimate Partner Violence:   . Fear of Current or Ex-Partner: Not on file  . Emotionally Abused: Not on file  . Physically Abused: Not on file  . Sexually Abused: Not on file   Family History:  Family History  Problem Relation Age of Onset  . Breast cancer Mother   . Heart disease Father     Review of Systems: Constitutional: Denies fevers, chills or abnormal weight loss Eyes: Denies blurriness of vision Ears, nose, mouth, throat, and face: Denies mucositis or sore throat Respiratory: no dyspnea Cardiovascular: Denies palpitation, chest discomfort or lower extremity swelling Gastrointestinal:  Denies nausea, constipation, diarrhea GU: Denies dysuria or incontinence Skin: Denies abnormal skin rashes Neurological: Per HPI Musculoskeletal: Denies joint pain, back or neck discomfort. No decrease in ROM Behavioral/Psych: Denies anxiety, disturbance in thought content, and mood instability  Physical Exam: Vitals:   12/18/18 1103  BP: (!) 141/72  Pulse: 84  Resp: 18  Temp: 98.5 F (36.9 C)  SpO2: 98%   KPS: 90. General: Alert, cooperative, pleasant, in no acute distress Head: Normal EENT: No conjunctival injection or scleral icterus. Oral mucosa moist Lungs: Resp effort normal Cardiac: Regular rate and rhythm Abdomen: Non-distended abdomen Skin: No rashes cyanosis or petechiae. Extremities: No clubbing or edema  Neurologic Exam: Mental Status: Awake, alert, attentive to examiner. Oriented to self and environment. Language is  fluent with intact comprehension.  Cranial Nerves: Visual acuity is grossly normal. Visual fields are full. Extra-ocular movements intact. No ptosis. Face is symmetric, tongue midline. Motor: Tone and bulk are normal. Power is full in both arms and legs. Reflexes are symmetric, no pathologic reflexes present. Intact finger to nose bilaterally Sensory: Intact to light touch and temperature Gait: Normal  Labs: I have reviewed the data as listed    Component Value Date/Time   NA 139 06/12/2018 1154   NA 141 07/15/2015 0000   NA 141 07/15/2015 0000   K 4.4 06/12/2018 1154   CL 102 06/12/2018 1154   CO2 27 06/12/2018 1154   GLUCOSE 81 06/12/2018 1154   BUN 14 06/12/2018 1154   BUN 22 (A) 07/15/2015 0000   BUN 22 (A) 07/15/2015 0000   CREATININE 0.78 06/12/2018 1154   CALCIUM 9.8 06/12/2018 1154   PROT 7.1 06/12/2018 1154   ALBUMIN 4.3 06/12/2018 1154   AST 15 06/12/2018 1154   ALT  11 06/12/2018 1154   ALKPHOS 75 06/12/2018 1154   BILITOT 0.5 06/12/2018 1154   GFRNONAA >60 09/29/2017 0639   GFRAA >60 09/29/2017 0639   Lab Results  Component Value Date   WBC 8.0 06/12/2018   NEUTROABS 7.5 (H) 09/28/2017   HGB 13.5 06/12/2018   HCT 40.4 06/12/2018   MCV 94.1 06/12/2018   PLT 260.0 06/12/2018   Imaging:  [facility] Clinician Interpretation: I have personally reviewed the CNS images as listed.  My interpretation, in the context of the patient's clinical presentation, is progressive disease  MR Brain W Wo Contrast  Result Date: 12/15/2018 CLINICAL DATA:  Follow-up meningioma. Craniotomy 09/07/2017 EXAM: MRI HEAD WITHOUT AND WITH CONTRAST TECHNIQUE: Multiplanar, multiecho pulse sequences of the brain and surrounding structures were obtained without and with intravenous contrast. CONTRAST:  11mL MULTIHANCE GADOBENATE DIMEGLUMINE 529 MG/ML IV SOLN COMPARISON:  MRI head 08/18/2018 FINDINGS: Brain: Bifrontal craniotomy for resection of left frontal meningioma. This meningioma has grown  significantly since the prior MRI now measuring approximately 17 x 24 mm on axial images, compared with 19 x 9 mm previously. Postsurgical encephalomalacia and chronic blood products in the left frontal lobe unchanged. Left frontal white matter edema shows mild progression. Mild amount of edema/gliosis in the right medial frontal lobe adjacent to the surgical site. 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 This meningioma is stable measuring approximately 31 x 33 mm on coronal images, and 33 x 30 mm on axial images. The tumor fills the sella and displaces the pituitary to the right. There is encasement of the left cavernous sinus which maintains normal flow void. Ventricle size normal. No midline shift. Negative for acute infarct. Vascular: Normal arterial flow voids Skull and upper cervical spine: Bifrontal craniotomy. Sinuses/Orbits: Paranasal sinuses clear. Bilateral cataract surgery Other: None IMPRESSION: 1. Bifrontal craniotomy for resection of left frontal meningioma. This has grown significantly since the prior study, and now measures 17 x 24 mm. 2. 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 Electronically Signed   By: Franchot Gallo M.D.   On: 12/15/2018 14:03     Assessment/Plan 1. Meningioma [facility])  Ms. Hochmuth is clinically stable today. Although cavernous sinus meningioma is stalb,e MRI unfortunately demonstrates clear progression of disease within previously debulked left frontal region.  This was histologically grade I meningioma.    We discussed our recommendations from tumor board, which were radiosurgery for progressive tumor, vs IMRT for both tumors.    Ms. Kooi was agreeable to this plan, and would prefer "shorter" treatment (ie SBRT) and continued close surveillance of secondary mass.  We will refer to radiation oncology for evaluation and CT simulation.  We appreciate the opportunity to participate in the care  of [name] [name].  She should return to clinic in 3-4 months following radiation therapy.  All questions were answered. The patient knows to call the clinic with any problems, questions or concerns. No barriers to learning were detected.  The total time spent in the encounter was 40 minutes and more than 50% was on counseling and review of test results   Ventura Sellers, MD Medical Director of Neuro-Oncology [facility] at [place] 12/18/18 11:20 AM

## 2018-12-19 NOTE — Progress Notes (Signed)
Location/Histology of Brain Tumor:  12/15/18 MRI brain IMPRESSION: 1. Bifrontal craniotomy for resection of left frontal meningioma. This has grown significantly since the prior study, and now measures 17 x 24 mm. 2. 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  Patient presented with symptoms of:  Memory difficulties/ confusion in 08/2017. Currently she presented for a follow up brain MRI on 12/15/18 which showed a increase in growth of the left frontal meningioma.  Past or anticipated interventions, if any, per neurosurgery:  09/07/2017 Procedure: Stereotactic bicoronal craniotomy for resection of left frontal parasagittal meningioma utilizing the BrainLab stereotactic navigation system with dividing and removing the anterior third of the superior sagittal sinus and duraplasty with dura matrix.  Surgeon: Kary Kos   Past or anticipated interventions, if any, per medical oncology:  Dr. Mickeal Skinner 12/18/18 Assessment/Plan 1. Meningioma [facility]) [name] [name] is clinically stable today. Although cavernous sinus meningioma is stalb,e MRI unfortunately demonstrates clear progression of disease within previously debulked left frontal region.  This was histologically grade I meningioma.    We discussed our recommendations from tumor board, which were radiosurgery for progressive tumor, vs IMRT for both tumors.    [name] [name] was agreeable to this plan, and would prefer "shorter" treatment (ie SBRT) and continued close surveillance of secondary mass.  We will refer to radiation oncology for evaluation and CT simulation.  We appreciate the opportunity to participate in the care of [name] [name].  She should return to clinic in 3-4 months following radiation therapy.  Dose of Decadron, if applicable: N/A  Recent neurologic symptoms, if any:   Seizures: She denies  Headaches: She denies  Nausea: She denies  Dizziness/ataxia: She denies.    Difficulty with hand coordination: She denies.   Focal numbness/weakness: She reports numbness to her extremities when holding the phone to long, or crossing her legs too long  Visual deficits/changes: She denies.   Confusion/Memory deficits: She reports slight memory deficits since her brain surgery in 2019.   Painful bone metastases at present, if any: N/A  SAFETY ISSUES:  Prior radiation? No  Pacemaker/ICD? No  Possible current pregnancy? No  Is the patient on methotrexate? No  Additional Complaints / other details:

## 2018-12-19 NOTE — Progress Notes (Signed)
Radiation Oncology         [phone] ________________________________  Initial outpatient Consultation by telephone as patient was unable to access MyChart video during pandemic precautions   Name: Zyann Drennen MRN: [id number]  Date: 12/20/2018  DOB: 05-14-1945  [id number], Gay Filler, MD  Ventura Sellers, MD   REFERRING PHYSICIAN: Ventura Sellers, MD  DIAGNOSIS:     ICD-10-CM   1. Meningioma, cerebral ([place])  D32.0     CHIEF COMPLAINT: Here to discuss management of meningioma   HISTORY OF PRESENT ILLNESS::Denym Achord is a 73 y.o. female who presented with memory loss in 07/2017. Brain MRI performed in 08/2017 demonstrated 2 lesions compatible with meningioma, one in the left frontal lobe and another tumor in the left cavernous sinus and orbital apex. She proceeded to resection of the left frontal tumor on 09/07/2017 under Dr. Saintclair Halsted. Pathology confirmed meningioma, WHO grade 1. She has been followed by Dr. Mickeal Skinner since 10/2017.  Most recent brain MRI performed on 12/15/2018 showed: significant growth of previously resected left frontal meningioma (previously 19 x 9 mm in 08/2018, now 17 x 24 mm); stable second meningioma involving left cavernous sinus and sella. MRIs reviewed at CNS tumor board this week.  She last saw Dr. Mickeal Skinner on 12/18/2018 to discuss her treatment options. They discussed SRS to the progressive tumor vs IMRT to both tumors. Per his note, the patient would prefer to receive the shorter course of radiation therapy and continue close surveillance of the secondary mass.   Recent neurologic symptoms, if any:   Seizures: She denies  Headaches: She denies  Nausea: She denies  Dizziness/ataxia: She denies.   Difficulty with hand coordination: She denies.   Focal numbness/weakness: She reports numbness to her extremities when holding the phone too long, or crossing her legs too long.  She denies any generalized numbness or weakness in her arms or legs  otherwise  Visual deficits/changes: She denies.   Confusion/Memory deficits: She reports slight memory deficits since her brain surgery in 2019.   SAFETY ISSUES:  Prior radiation? No  Pacemaker/ICD? No  Possible current pregnancy? No Is the patient on methotrexate? No  She she has questions about hair loss during radiation therapy.   PREVIOUS RADIATION THERAPY: No  PAST MEDICAL HISTORY:  has a past medical history of Cancer ([place]), Hyperlipidemia, Osteoporosis, and PONV (postoperative nausea and vomiting).    PAST SURGICAL HISTORY: Past Surgical History:  Procedure Laterality Date  . APPLICATION OF CRANIAL NAVIGATION N/A 09/07/2017   Procedure: APPLICATION OF CRANIAL NAVIGATION;  Surgeon: Kary Kos, MD;  Location: [place];  Service: Neurosurgery;  Laterality: N/A;  . BREAST ENHANCEMENT SURGERY    . BUNIONECTOMY    . COLONOSCOPY    . CRANIOTOMY N/A 09/07/2017   Procedure: CRAINIOTOMY BI CORONAL w/BRAINLAB;  Surgeon: Kary Kos, MD;  Location: [place];  Service: Neurosurgery;  Laterality: N/A;  . EYE SURGERY Bilateral    cataract surgery  . SKIN CANCER EXCISION    . WISDOM TOOTH EXTRACTION      FAMILY HISTORY: family history includes Breast cancer in her mother; Heart disease in her father.  SOCIAL HISTORY:  reports that she has never smoked. She has never used smokeless tobacco. She reports that she does not drink alcohol or use drugs.  ALLERGIES: Bee venom and Losartan  MEDICATIONS:  Current Outpatient Medications  Medication Sig Dispense Refill  . ezetimibe (ZETIA) 10 MG tablet TAKE 1 TABLET(10 MG) BY MOUTH DAILY 90 tablet 1   No  current facility-administered medications for this encounter.    REVIEW OF SYSTEMS:  Notable for that above.   PHYSICAL EXAM:  vitals were not taken for this visit.     LABORATORY DATA:  Lab Results  Component Value Date   WBC 8.0 06/12/2018   HGB 13.5 06/12/2018   HCT 40.4 06/12/2018   MCV 94.1 06/12/2018   PLT 260.0 06/12/2018    CMP     Component Value Date/Time   NA 139 06/12/2018 1154   NA 141 07/15/2015 0000   NA 141 07/15/2015 0000   K 4.4 06/12/2018 1154   CL 102 06/12/2018 1154   CO2 27 06/12/2018 1154   GLUCOSE 81 06/12/2018 1154   BUN 14 06/12/2018 1154   BUN 22 (A) 07/15/2015 0000   BUN 22 (A) 07/15/2015 0000   CREATININE 0.78 06/12/2018 1154   CALCIUM 9.8 06/12/2018 1154   PROT 7.1 06/12/2018 1154   ALBUMIN 4.3 06/12/2018 1154   AST 15 06/12/2018 1154   ALT 11 06/12/2018 1154   ALKPHOS 75 06/12/2018 1154   BILITOT 0.5 06/12/2018 1154   GFRNONAA >60 09/29/2017 0639   GFRAA >60 09/29/2017 0639         RADIOGRAPHY: MR Brain W Wo Contrast  Result Date: 12/15/2018 CLINICAL DATA:  Follow-up meningioma. Craniotomy 09/07/2017 EXAM: MRI HEAD WITHOUT AND WITH CONTRAST TECHNIQUE: Multiplanar, multiecho pulse sequences of the brain and surrounding structures were obtained without and with intravenous contrast. CONTRAST:  55mL MULTIHANCE GADOBENATE DIMEGLUMINE 529 MG/ML IV SOLN COMPARISON:  MRI head 08/18/2018 FINDINGS: Brain: Bifrontal craniotomy for resection of left frontal meningioma. This meningioma has grown significantly since the prior MRI now measuring approximately 17 x 24 mm on axial images, compared with 19 x 9 mm previously. Postsurgical encephalomalacia and chronic blood products in the left frontal lobe unchanged. Left frontal white matter edema shows mild progression. Mild amount of edema/gliosis in the right medial frontal lobe adjacent to the surgical site. 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 This meningioma is stable measuring approximately 31 x 33 mm on coronal images, and 33 x 30 mm on axial images. The tumor fills the sella and displaces the pituitary to the right. There is encasement of the left cavernous sinus which maintains normal flow void. Ventricle size normal. No midline shift. Negative for acute infarct. Vascular: Normal arterial flow voids Skull and  upper cervical spine: Bifrontal craniotomy. Sinuses/Orbits: Paranasal sinuses clear. Bilateral cataract surgery Other: None IMPRESSION: 1. Bifrontal craniotomy for resection of left frontal meningioma. This has grown significantly since the prior study, and now measures 17 x 24 mm. 2. 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 Electronically Signed   By: Franchot Gallo M.D.   On: 12/15/2018 14:03      IMPRESSION/PLAN: This is a lovely 73 year old woman with 2 meningiomas.  The meningioma at the cavernous sinus is without any symptoms and is and stable.  The left frontal meningioma was removed surgically with postoperative progression.  She denies any symptoms related to the cavernous sinus meningioma.  She would rather observe this than treat it but understands that if it progresses, its in an area where visual deficits could occur; we may need to treat this in the future with several weeks of IMRT.  For now, she would like to observe it with serial MRIs.  The meningioma in the left frontal region of the brain is behaving more aggressively.  And, it is in a  location and small enough to allow stereotactic radiosurgery.  I recommend treating this to 25 Gray in 5 fractions, given every other day.  We discussed the potential risks benefits and side effects of stereotactic radiosurgery which may include but not necessarily be limited to headaches, skin irritation, fatigue, radionecrosis, brain injury, temporary and possibly permanent hair loss in a portion of the scalp.  Her questions were answered to the best of my ability and she is enthusiastic about proceeding.  CT simulation will take place next week.  This encounter was provided by telemedicine platform by telephone as patient was unable to access MyChart video during pandemic precautions The patient has given verbal consent for this type of encounter and has been advised to only accept a meeting of this type in a  secure network environment. The time spent during this encounter was over 25 minutes. The attendants for this meeting include [name]  and [name].  During the encounter, [name] was located at [facility] Radiation Oncology Department.  Littie Hassett was located at home.     __________________________________________   [name], MD  This document serves as a record of services personally performed by [name], MD. It was created on her behalf by Wilburn Mylar, a trained medical scribe. The creation of this record is based on the scribe's personal observations and the provider's statements to them. This document has been checked and approved by the attending provider.

## 2018-12-20 ENCOUNTER — Ambulatory Visit
Admission: RE | Admit: 2018-12-20 | Discharge: 2018-12-20 | Disposition: A | Discharge status: Home / Self Care | Payer: Medicare Other | Source: Ambulatory Visit | Attending: Radiation Oncology | Admitting: Radiation Oncology

## 2018-12-20 ENCOUNTER — Other Ambulatory Visit: Payer: Self-pay

## 2018-12-20 ENCOUNTER — Encounter: Payer: Self-pay | Admitting: Radiation Oncology

## 2018-12-20 DIAGNOSIS — Z9889 Other specified postprocedural states: Secondary | ICD-10-CM | POA: Diagnosis not present

## 2018-12-20 DIAGNOSIS — D320 Benign neoplasm of cerebral meninges: Secondary | ICD-10-CM | POA: Diagnosis not present

## 2018-12-20 DIAGNOSIS — D329 Benign neoplasm of meninges, unspecified: Secondary | ICD-10-CM

## 2018-12-20 DIAGNOSIS — Z803 Family history of malignant neoplasm of breast: Secondary | ICD-10-CM | POA: Diagnosis not present

## 2018-12-22 ENCOUNTER — Encounter: Payer: Self-pay | Admitting: Radiation Oncology

## 2018-12-22 DIAGNOSIS — D320 Benign neoplasm of cerebral meninges: Secondary | ICD-10-CM | POA: Insufficient documentation

## 2018-12-26 ENCOUNTER — Other Ambulatory Visit: Payer: Self-pay | Admitting: Radiation Therapy

## 2018-12-26 ENCOUNTER — Ambulatory Visit
Admission: RE | Admit: 2018-12-26 | Discharge: 2018-12-26 | Disposition: A | Discharge status: Home / Self Care | Payer: Medicare Other | Source: Ambulatory Visit | Attending: Radiation Oncology | Admitting: Radiation Oncology

## 2018-12-26 ENCOUNTER — Other Ambulatory Visit: Payer: Self-pay

## 2018-12-26 DIAGNOSIS — D320 Benign neoplasm of cerebral meninges: Secondary | ICD-10-CM

## 2018-12-26 DIAGNOSIS — D329 Benign neoplasm of meninges, unspecified: Secondary | ICD-10-CM

## 2018-12-26 LAB — BUN & CREATININE (CHCC)
BUN: 16 mg/dL (ref 8–23)
Creatinine: 0.84 mg/dL (ref 0.44–1.00)
GFR, Est AFR Am: >60 mL/min (ref >60)
GFR, Estimated: >60 mL/min (ref >60)

## 2018-12-26 MED ORDER — SODIUM CHLORIDE 0.9% FLUSH
10.0000 mL | Freq: Once | INTRAVENOUS | Status: AC
  Administered 2018-12-26: 10 mL via INTRAVENOUS

## 2018-12-26 NOTE — Progress Notes (Signed)
Has armband been applied?  Yes  Does patient have an allergy to IV contrast dye?: No   Has patient ever received premedication for IV contrast dye?: N/A  Does patient take metformin?: No  If patient does take metformin when was the last dose: N/A  Date of lab work: 12/26/18 BUN: 16 CR: 0.84 EGFR: >60  IV site: Left AC  Has IV site been added to flowsheet? Yes

## 2018-12-27 NOTE — Progress Notes (Signed)
  Name: [name] [name] MRN: [id number]  Date: 12/26/2018  DOB: [birth year]  SIMULATION AND TREATMENT PLANNING NOTE    ICD-10-CM   1. Meningioma, cerebral [facility])  D32.0       Radiation Oncology         [phone] ________________________________  Name: [name] [name] MRN: [id number]  Date: 12/26/2018  DOB: 05/12/1945  SIMULATION AND TREATMENT PLANNING NOTE    ICD-10-CM   1. Meningioma, cerebral ([place])  D32.0     DIAGNOSIS:  As above  NARRATIVE:  The patient was brought to the [facility].  Identity was confirmed.  All relevant records and images related to the planned course of therapy were reviewed.  The patient freely provided informed written consent to proceed with treatment after reviewing the details related to the planned course of therapy. The consent form was witnessed and verified by the simulation staff. Intravenous access was established for contrast administration. Then, the patient was set-up in a stable reproducible supine position for radiation therapy.  A relocatable thermoplastic stereotactic head frame was fabricated for precise immobilization.  CT images were obtained.  Surface markings were placed.  The CT images were loaded into the planning software and fused with the patient's targeting MRI scan.  Then the target and avoidance structures were contoured.  Treatment planning then occurred.  The radiation prescription was entered and confirmed.  I have requested 3D planning  I have requested a DVH of the following structures: Brain stem, brain, left eye, right eye, lenses, optic chiasm, target volumes, uninvolved brain, and normal tissue.    SPECIAL TREATMENT PROCEDURE:  The planned course of therapy using radiation constitutes a special treatment procedure. Special care is required in the management of this patient for the following reasons:  High dose per fraction requiring special monitoring for increased toxicities of treatment including daily  imaging.  The special nature of the planned course of radiotherapy will require increased physician supervision and oversight to ensure patient's safety with optimal treatment outcomes.  PLAN:  The patient will receive SRS (stereotactic radiosurgery), dose of 25 Gy in 5 fractions every other day to the left frontal meningioma.  ________________________________    [name], MD

## 2018-12-28 DIAGNOSIS — D320 Benign neoplasm of cerebral meninges: Secondary | ICD-10-CM | POA: Diagnosis not present

## 2019-01-09 ENCOUNTER — Other Ambulatory Visit: Payer: Self-pay

## 2019-01-09 ENCOUNTER — Ambulatory Visit
Admission: RE | Admit: 2019-01-09 | Discharge: 2019-01-09 | Disposition: A | Discharge status: Home / Self Care | Payer: Medicare Other | Source: Ambulatory Visit | Attending: Radiation Oncology | Admitting: Radiation Oncology

## 2019-01-09 ENCOUNTER — Encounter: Payer: Self-pay | Admitting: Radiation Oncology

## 2019-01-09 VITALS — BP 157/98 | HR 71 | Resp 20

## 2019-01-09 DIAGNOSIS — C7931 Secondary malignant neoplasm of brain: Secondary | ICD-10-CM | POA: Diagnosis not present

## 2019-01-09 DIAGNOSIS — D320 Benign neoplasm of cerebral meninges: Secondary | ICD-10-CM | POA: Diagnosis not present

## 2019-01-09 NOTE — Progress Notes (Signed)
  Radiation Oncology         (336) [phone] ________________________________  Name: [name] [name] MRN: [id number]  Date: 01/09/2019  DOB: 1945/07/13  Stereotactic Treatment Procedure Note  SPECIAL TREATMENT PROCEDURE  Outpatient    ICD-10-CM   1. 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  It showed 3-dimensional radiation distributions overlaid onto the planning CT/MRI image set.  The [facility] for the target structures as well as the organs at risk were reviewed. The documentation of the 3D plan and dosimetry are filed in the radiation oncology EMR.  NARRATIVE:  [name] [name] was brought to the TrueBeam stereotactic radiation treatment machine and placed supine on the CT couch. The head frame was applied, and the patient was set up for stereotactic radiosurgery.  Neurosurgery was present for the set-up and delivery  SIMULATION VERIFICATION:  In the couch zero-angle position, the patient underwent Exactrac imaging using the Brainlab system with orthogonal KV images.  These were carefully aligned and repeated to confirm treatment position for each of the isocenters.  The Exactrac snap film verification was repeated at each couch angle.  SPECIAL TREATMENT PROCEDURE: [name] [name] received stereotactic radiosurgery to the following targets:  Left frontal 65mm target was treated using 4 Rapid Arc VMAT Beams to a prescription dose of 5 Gy (total dose will be 25Gy in 5 fractions).  ExacTrac Snap verification was performed for each couch angle.   This constitutes a special treatment procedure due to the ablative dose delivered and the technical nature of treatment.  This highly technical modality of treatment ensures that the ablative dose is centered on the patient's tumor while sparing normal tissues from excessive dose and risk of detrimental  effects.  STEREOTACTIC TREATMENT MANAGEMENT:  Following delivery, the patient was transported to nursing in stable condition and monitored for possible acute effects.  Vital signs were recorded BP (!) 157/98 (BP Location: Left Arm)   Pulse 71   Resp 20   SpO2 100% . The patient tolerated treatment without significant acute effects, and was discharged to home in stable condition.    PLAN: Continue treatments twice / week to 25Gy / 5 fractions.  ________________________________   [name], MD

## 2019-01-10 DIAGNOSIS — L821 Other seborrheic keratosis: Secondary | ICD-10-CM | POA: Diagnosis not present

## 2019-01-10 DIAGNOSIS — D485 Neoplasm of uncertain behavior of skin: Secondary | ICD-10-CM | POA: Diagnosis not present

## 2019-01-10 DIAGNOSIS — Z85828 Personal history of other malignant neoplasm of skin: Secondary | ICD-10-CM | POA: Diagnosis not present

## 2019-01-10 DIAGNOSIS — C44519 Basal cell carcinoma of skin of other part of trunk: Secondary | ICD-10-CM | POA: Diagnosis not present

## 2019-01-10 DIAGNOSIS — L308 Other specified dermatitis: Secondary | ICD-10-CM | POA: Diagnosis not present

## 2019-01-12 ENCOUNTER — Other Ambulatory Visit: Payer: Self-pay

## 2019-01-12 ENCOUNTER — Ambulatory Visit
Admission: RE | Admit: 2019-01-12 | Discharge: 2019-01-12 | Disposition: A | Discharge status: Home / Self Care | Payer: Medicare Other | Source: Ambulatory Visit | Attending: Radiation Oncology | Admitting: Radiation Oncology

## 2019-01-12 VITALS — BP 149/93 | HR 70 | Temp 98.3°F | Resp 20

## 2019-01-12 DIAGNOSIS — D320 Benign neoplasm of cerebral meninges: Secondary | ICD-10-CM

## 2019-01-15 ENCOUNTER — Other Ambulatory Visit: Payer: Self-pay

## 2019-01-15 ENCOUNTER — Ambulatory Visit
Admission: RE | Admit: 2019-01-15 | Discharge: 2019-01-15 | Disposition: A | Discharge status: Home / Self Care | Payer: Medicare Other | Source: Ambulatory Visit | Attending: Radiation Oncology | Admitting: Radiation Oncology

## 2019-01-15 VITALS — BP 160/98 | HR 62 | Temp 98.2°F | Resp 20

## 2019-01-15 DIAGNOSIS — D320 Benign neoplasm of cerebral meninges: Secondary | ICD-10-CM

## 2019-01-16 NOTE — Progress Notes (Signed)
  Radiation Oncology         (336) [phone] ________________________________  Name: [name] [name] MRN: [id number]  Date: 01/15/2019  DOB: 12/21/1945  Stereotactic Treatment Procedure Note  SPECIAL TREATMENT PROCEDURE  Outpatient    ICD-10-CM   1. 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  It showed 3-dimensional radiation distributions overlaid onto the planning CT/MRI image set.  The [facility] for the target structures as well as the organs at risk were reviewed. The documentation of the 3D plan and dosimetry are filed in the radiation oncology EMR.  NARRATIVE:  [name] [name] was brought to the TrueBeam stereotactic radiation treatment machine and placed supine on the CT couch. The head frame was applied, and the patient was set up for stereotactic radiosurgery.  Neurosurgery was present for the set-up and delivery  SIMULATION VERIFICATION:  In the couch zero-angle position, the patient underwent Exactrac imaging using the Brainlab system with orthogonal KV images.  These were carefully aligned and repeated to confirm treatment position for each of the isocenters.  The Exactrac snap film verification was repeated at each couch angle.  SPECIAL TREATMENT PROCEDURE: [name] [name] received stereotactic radiosurgery to the following targets:  Left frontal 66mm target was treated using 4 Rapid Arc VMAT Beams to a prescription dose of 5 Gy (total dose will be 25Gy in 5 fractions).  ExacTrac Snap verification was performed for each couch angle.   This constitutes a special treatment procedure due to the ablative dose delivered and the technical nature of treatment.  This highly technical modality of treatment ensures that the ablative dose is centered on the patient's tumor while sparing normal tissues from excessive dose and risk of detrimental  effects.  STEREOTACTIC TREATMENT MANAGEMENT:  Following delivery, the patient was transported to nursing in stable condition and monitored for possible acute effects.  Vital signs were recorded BP (!) 160/98 (BP Location: Right Arm, Patient Position: Sitting, Cuff Size: Normal)   Pulse 62   Temp 98.2 F (36.8 C)   Resp 20   SpO2 100% . The patient tolerated treatment without significant acute effects, and was discharged to home in stable condition.    PLAN: Continue treatments 2-3 times / week to 25Gy / 5 fractions.  ________________________________   [name], MD

## 2019-01-17 ENCOUNTER — Other Ambulatory Visit: Payer: Self-pay

## 2019-01-17 ENCOUNTER — Ambulatory Visit
Admission: RE | Admit: 2019-01-17 | Discharge: 2019-01-17 | Disposition: A | Discharge status: Home / Self Care | Payer: Medicare Other | Source: Ambulatory Visit | Attending: Radiation Oncology | Admitting: Radiation Oncology

## 2019-01-17 VITALS — BP 157/100 | HR 71 | Temp 98.7°F | Resp 20

## 2019-01-17 DIAGNOSIS — D329 Benign neoplasm of meninges, unspecified: Secondary | ICD-10-CM

## 2019-01-17 DIAGNOSIS — D320 Benign neoplasm of cerebral meninges: Secondary | ICD-10-CM | POA: Diagnosis not present

## 2019-01-17 NOTE — Progress Notes (Signed)
  Radiation Oncology         [phone] ________________________________  Name: [name] [name] MRN: [id number]  Date: 01/17/2019  DOB: Jan 06, 1945  Stereotactic Treatment Procedure Note  SPECIAL TREATMENT PROCEDURE  Outpatient    ICD-10-CM   1. 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  It showed 3-dimensional radiation distributions overlaid onto the planning CT/MRI image set.  The [facility] for the target structures as well as the organs at risk were reviewed. The documentation of the 3D plan and dosimetry are filed in the radiation oncology EMR.  NARRATIVE:  [name] [name] was brought to the TrueBeam stereotactic radiation treatment machine and placed supine on the CT couch. The head frame was applied, and the patient was set up for stereotactic radiosurgery.  Neurosurgery was present for the set-up and delivery  SIMULATION VERIFICATION:  In the couch zero-angle position, the patient underwent Exactrac imaging using the Brainlab system with orthogonal KV images.  These were carefully aligned and repeated to confirm treatment position for each of the isocenters.  The Exactrac snap film verification was repeated at each couch angle.  SPECIAL TREATMENT PROCEDURE: [name] [name] received stereotactic radiosurgery to the following targets:  Left frontal 62mm target was treated using 4 Rapid Arc VMAT Beams to a prescription dose of 5 Gy (total dose will be 25Gy in 5 fractions).  ExacTrac Snap verification was performed for each couch angle.   This constitutes a special treatment procedure due to the ablative dose delivered and the technical nature of treatment.  This highly technical modality of treatment ensures that the ablative dose is centered on the patient's tumor while sparing normal tissues from excessive dose and risk of detrimental  effects.  STEREOTACTIC TREATMENT MANAGEMENT:  Following delivery, the patient was transported to nursing in stable condition and monitored for possible acute effects.  Vital signs were recorded There were no vitals taken for this visit.. The patient tolerated treatment without significant acute effects, and was discharged to home in stable condition.    PLAN: Continue treatments 2-3 times / week to 25Gy / 5 fractions.  ________________________________   [name], MD

## 2019-01-19 ENCOUNTER — Encounter: Payer: Self-pay | Admitting: Radiation Oncology

## 2019-01-19 ENCOUNTER — Ambulatory Visit
Admission: RE | Admit: 2019-01-19 | Discharge: 2019-01-19 | Disposition: A | Discharge status: Home / Self Care | Payer: Medicare Other | Source: Ambulatory Visit | Attending: Radiation Oncology | Admitting: Radiation Oncology

## 2019-01-19 ENCOUNTER — Other Ambulatory Visit: Payer: Self-pay

## 2019-01-19 VITALS — BP 163/90 | HR 63 | Temp 97.0°F | Resp 18

## 2019-01-19 DIAGNOSIS — D320 Benign neoplasm of cerebral meninges: Secondary | ICD-10-CM

## 2019-01-19 DIAGNOSIS — C7931 Secondary malignant neoplasm of brain: Secondary | ICD-10-CM | POA: Diagnosis not present

## 2019-01-19 NOTE — Progress Notes (Signed)
  Radiation Oncology         (336) [phone] ________________________________  Name: [name] [name] MRN: [id number]  Date: 01/19/2019  DOB: [birth year]  Stereotactic Treatment Procedure Note  SPECIAL TREATMENT PROCEDURE  Outpatient    ICD-10-CM   1. 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  It showed 3-dimensional radiation distributions overlaid onto the planning CT/MRI image set.  The [facility] for the target structures as well as the organs at risk were reviewed. The documentation of the 3D plan and dosimetry are filed in the radiation oncology EMR.  NARRATIVE:  [name] [name] was brought to the TrueBeam stereotactic radiation treatment machine and placed supine on the CT couch. The head frame was applied, and the patient was set up for stereotactic radiosurgery.  Neurosurgery was present for the set-up and delivery  SIMULATION VERIFICATION:  In the couch zero-angle position, the patient underwent Exactrac imaging using the Brainlab system with orthogonal KV images.  These were carefully aligned and repeated to confirm treatment position for each of the isocenters.  The Exactrac snap film verification was repeated at each couch angle.  SPECIAL TREATMENT PROCEDURE: [name] [name] received stereotactic radiosurgery to the following targets:  Left frontal 42mm target was treated using 4 Rapid Arc VMAT Beams to a prescription dose of 5 Gy (total dose  25Gy in 5 fractions).  ExacTrac Snap verification was performed for each couch angle.   This constitutes a special treatment procedure due to the ablative dose delivered and the technical nature of treatment.  This highly technical modality of treatment ensures that the ablative dose is centered on the patient's tumor while sparing normal tissues from excessive dose and risk of detrimental  effects.  STEREOTACTIC TREATMENT MANAGEMENT:  Following delivery, the patient was transported to nursing in stable condition and monitored for possible acute effects.  Vital signs were recorded BP (!) 163/90   Pulse 63   Temp (!) 97 F (36.1 C) (Tympanic)   Resp 18   SpO2 100% . The patient tolerated treatment without significant acute effects, and was discharged to home in stable condition.    PLAN: Follow-up in 1 month. ________________________________   [name], MD

## 2019-01-24 DIAGNOSIS — C44519 Basal cell carcinoma of skin of other part of trunk: Secondary | ICD-10-CM | POA: Diagnosis not present

## 2019-01-24 DIAGNOSIS — Z85828 Personal history of other malignant neoplasm of skin: Secondary | ICD-10-CM | POA: Diagnosis not present

## 2019-01-24 DIAGNOSIS — L820 Inflamed seborrheic keratosis: Secondary | ICD-10-CM | POA: Diagnosis not present

## 2019-01-24 NOTE — Progress Notes (Signed)
  Radiation Oncology         [phone] ________________________________  Name: [name] [name] MRN: [id number]  Date: 01/12/2019  DOB: 09/09/1945  Stereotactic Treatment Procedure Note  SPECIAL TREATMENT PROCEDURE  Outpatient    ICD-10-CM   1. 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  It showed 3-dimensional radiation distributions overlaid onto the planning CT/MRI image set.  The [facility] for the target structures as well as the organs at risk were reviewed. The documentation of the 3D plan and dosimetry are filed in the radiation oncology EMR.  NARRATIVE:  [name] [name] was brought to the TrueBeam stereotactic radiation treatment machine and placed supine on the CT couch. The head frame was applied, and the patient was set up for stereotactic radiosurgery.  Neurosurgery was present for the set-up and delivery  SIMULATION VERIFICATION:  In the couch zero-angle position, the patient underwent Exactrac imaging using the Brainlab system with orthogonal KV images.  These were carefully aligned and repeated to confirm treatment position for each of the isocenters.  The Exactrac snap film verification was repeated at each couch angle.  SPECIAL TREATMENT PROCEDURE: [name] received stereotactic radiosurgery to the following targets:  Left frontal 3mm target was treated using 4 Rapid Arc VMAT Beams to a prescription dose of 5 Gy (total dose will be 25Gy in 5 fractions).  ExacTrac Snap verification was performed for each couch angle.   This constitutes a special treatment procedure due to the ablative dose delivered and the technical nature of treatment.  This highly technical modality of treatment ensures that the ablative dose is centered on the patient's tumor while sparing normal tissues from excessive dose and risk of detrimental  effects.  STEREOTACTIC TREATMENT MANAGEMENT:  Following delivery, the patient was transported to nursing in stable condition and monitored for possible acute effects.  Vital signs were recorded BP (!) 149/93 (BP Location: Left Arm, Patient Position: Sitting, Cuff Size: Normal)   Pulse 70   Temp 98.3 F (36.8 C)   Resp 20   SpO2 100% . The patient tolerated treatment without significant acute effects, and was discharged to home in stable condition.    PLAN: Continue treatments twice / week to 25Gy / 5 fractions.  ________________________________   [name], MD

## 2019-01-31 NOTE — Addendum Note (Signed)
Encounter addended by: Kary Kos, MD on: 01/31/2019 9:52 AM  Actions taken: Clinical Note Signed

## 2019-01-31 NOTE — Procedures (Signed)
  Name: [name] [name]  MRN: [id number]  Date: 01/19/2019   DOB: [birth year]  Stereotactic Radiosurgery Operative Note  PRE-OPERATIVE DIAGNOSIS:  Solitary Brain Metastasis  POST-OPERATIVE DIAGNOSIS:  Solitary Brain Metastasis  PROCEDURE:  Stereotactic Radiosurgery  SURGEON:  Hershey Knauer P, MD  NARRATIVE: The patient underwent a radiation treatment planning session in the radiation oncology simulation suite under the care of the radiation oncology physician and physicist.  I participated closely in the radiation treatment planning afterwards. The patient underwent planning CT which was fused to 3T high resolution MRI with 1 mm axial slices.  These images were fused on the planning system.  We contoured the gross target volumes and subsequently expanded this to yield the Planning Target Volume. I actively participated in the planning process.  I helped to define and review the target contours and also the contours of the optic pathway, eyes, brainstem and selected nearby organs at risk.  All the dose constraints for critical structures were reviewed and compared to AAPM Task Group 101.  The prescription dose conformity was reviewed.  I approved the plan electronically.    Accordingly, [name] was brought to the TrueBeam stereotactic radiation treatment linac and placed in the custom immobilization mask.  The patient was aligned according to the IR fiducial markers with BrainLab Exactrac, then orthogonal x-rays were used in ExacTrac with the 6DOF robotic table and the shifts were made to align the patient  [name] received stereotactic radiosurgery uneventfully.    The detailed description of the procedure is recorded in the radiation oncology procedure note.  I was present for the duration of the procedure.  DISPOSITION:  Following delivery, the patient was transported to nursing in stable condition and monitored for possible acute effects to be discharged to home in stable condition  with follow-up in one month.  Emeric Novinger P, MD 01/31/2019 9:37 AM

## 2019-02-02 ENCOUNTER — Ambulatory Visit: Payer: Medicare Other

## 2019-02-05 NOTE — Progress Notes (Signed)
  Patient Name: [name] [name] MRN: [id number] DOB: 03/27/1945 Referring Physician: Cecil Cobbs Date of Service: 01/19/2019 [facility] [place] [facility], [place]                                                        End Of Treatment Note  Diagnoses: D32.0-Benign neoplasm of cerebral meninges  Intent: curative  Radiation Treatment Dates: 01/09/2019 through 01/19/2019 Site Technique Total Dose (Gy) Dose per Fx (Gy) Completed Fx Beam Energies  Brain: Brain_SRS IMRT 25/25 5 5/5 6XFFF   Narrative: The patient tolerated SRS to the Left frontal 77mm meningioma relatively well.   Plan: The patient will follow-up with radiation oncology in 1 mo. -----------------------------------  [name], MD

## 2019-02-08 ENCOUNTER — Other Ambulatory Visit: Payer: Self-pay

## 2019-02-08 ENCOUNTER — Ambulatory Visit: Payer: Medicare Other | Attending: Internal Medicine

## 2019-02-08 DIAGNOSIS — E785 Hyperlipidemia, unspecified: Secondary | ICD-10-CM

## 2019-02-08 DIAGNOSIS — Z23 Encounter for immunization: Secondary | ICD-10-CM

## 2019-02-08 MED ORDER — EZETIMIBE 10 MG PO TABS: ORAL_TABLET | ORAL | 1 refills | Status: DC

## 2019-02-08 NOTE — Progress Notes (Signed)
   Covid-19 Vaccination Clinic  Name:  [name] [name]    MRN: [id number] DOB: 1945/05/18  02/08/2019  [name] was observed post Covid-19 immunization for 15 minutes without incidence. She was provided with Vaccine Information Sheet and instruction to access the V-Safe system.   [name] was instructed to call 911 with any severe reactions post vaccine: Marland Kitchen Difficulty breathing  . Swelling of your face and throat  . A fast heartbeat  . A bad rash all over your body  . Dizziness and weakness    Immunizations Administered    Name Date Dose VIS Date Route   Pfizer COVID-19 Vaccine 02/08/2019  9:45 AM 0.3 mL 12/15/2018 Intramuscular   Manufacturer: [place]   Lot: [id number]   [place]: [id number]

## 2019-02-19 ENCOUNTER — Ambulatory Visit: Payer: Medicare Other

## 2019-02-21 ENCOUNTER — Encounter: Payer: Self-pay | Admitting: Radiation Oncology

## 2019-02-23 ENCOUNTER — Encounter: Payer: Self-pay | Admitting: Radiation Oncology

## 2019-02-23 ENCOUNTER — Ambulatory Visit
Admission: RE | Admit: 2019-02-23 | Discharge: 2019-02-23 | Disposition: A | Discharge status: Home / Self Care | Payer: Medicare Other | Source: Ambulatory Visit | Attending: Radiation Oncology | Admitting: Radiation Oncology

## 2019-02-23 DIAGNOSIS — D320 Benign neoplasm of cerebral meninges: Secondary | ICD-10-CM

## 2019-02-23 HISTORY — DX: Personal history of irradiation: Z92.3

## 2019-02-23 NOTE — Progress Notes (Signed)
Radiation Oncology         (336) [phone] ________________________________  Name: Dreyah Minkler MRN: [id number]  Date: 02/23/2019  DOB: 1945/01/26  Follow-Up Visit Note by telephone during pandemic precautions  Outpatient  CC: Copland, Gay Filler, MD  Ventura Sellers, MD  Diagnosis:   Cerebral Meningioma    ICD-10-CM   1. Meningioma, cerebral ([facility])  D32.0      CHIEF COMPLAINT: Here for follow-up and surveillance of meningioma  Interval Since Last Radiation:  5 weeks  01/09/2019 through 01/19/2019 Site Technique Total Dose (Gy) Dose per Fx (Gy) Completed Fx Beam Energies  Brain: Brain_SRS IMRT 25/25 5 5/5 6XFFF    Narrative:  The patient returns today for routine follow-up.          Symptomatically, denies  seizures, headaches, nausea, new neurologic deficits, visual changes.    Patient reports hair loss at the frontal scalp.  She reports that she had a depressed mood before surgery and it is still present though not as severe.  She feels that it is inflicted by current events, political tensions, and the pandemic.     ALLERGIES:  is allergic to bee venom and losartan.  Meds: Current Outpatient Medications  Medication Sig Dispense Refill  . ezetimibe (ZETIA) 10 MG tablet TAKE 1 TABLET(10 MG) BY MOUTH DAILY 90 tablet 1   No current facility-administered medications for this encounter.    Physical Findings: The patient is in no acute distress. Patient is alert and oriented.  vitals were not taken for this visit. .     Lab Findings: Lab Results  Component Value Date   WBC 8.0 06/12/2018   HGB 13.5 06/12/2018   HCT 40.4 06/12/2018   MCV 94.1 06/12/2018   PLT 260.0 06/12/2018    Radiographic Findings: No results found.  Impression/Plan: Overall she is satisfied with how she is doing several weeks after [facility] for her meningioma.  I suggested she try taking prenatal vitamin daily as this could potentially promote hair regrowth.  Based on the distribution of radiation I  am confident that her hair will eventually grow back.  For her depressed mood, we talked about coping mechanisms and I recommended social work referral; She declines social work and counseling at this time but will try my coping mechanisms and let Dr. Mickeal Skinner know if things do not improve emotionally for her. Mont Dutton will navigate her to f/u w/ Dr Mickeal Skinner in 2-3 mo.  He will follow her with MRIs in the future. I will see her back PRN and discuss her imaging at conference with the team.  I wished her the best and she expressed her gratitude for the care she received here  This encounter was provided by telemedicine platform by telephone as patient was unable to access MyChart video during pandemic precautions The patient has given verbal consent for this type of encounter and has been advised to only accept a meeting of this type in a secure network environment. The attendants for this meeting include [name]  and [name].  During the encounter, [name] was located at [facility] Radiation Oncology Department.  Mariana Doorley was located at home.  On date of service, in total, I spent 25 minutes on this encounter.  _____________________________________   [name], MD  This document serves as a record of services personally performed by [name], MD. It was created on her behalf by Wilburn Mylar, a trained medical scribe. The creation of this record is  based on the scribe's personal observations and the provider's statements to them. This document has been checked and approved by the attending provider.

## 2019-02-27 ENCOUNTER — Telehealth: Payer: Self-pay | Admitting: Internal Medicine

## 2019-02-27 ENCOUNTER — Other Ambulatory Visit: Payer: Self-pay | Admitting: *Deleted

## 2019-02-27 DIAGNOSIS — D329 Benign neoplasm of meninges, unspecified: Secondary | ICD-10-CM

## 2019-02-27 NOTE — Telephone Encounter (Signed)
Scheduled 4/19 msg per 2/23 sch msg. Confirmed appt date and time with pt.

## 2019-03-05 ENCOUNTER — Ambulatory Visit: Payer: Medicare Other | Attending: Internal Medicine

## 2019-03-05 DIAGNOSIS — Z23 Encounter for immunization: Secondary | ICD-10-CM | POA: Insufficient documentation

## 2019-03-05 NOTE — Progress Notes (Signed)
   Covid-19 Vaccination Clinic  Name:  [name] [name]    MRN: [id number] DOB: 1945-03-09  03/05/2019  [name] was observed post Covid-19 immunization for 15 minutes without incidence. She was provided with Vaccine Information Sheet and instruction to access the V-Safe system.   [name] was instructed to call 911 with any severe reactions post vaccine: Marland Kitchen Difficulty breathing  . Swelling of your face and throat  . A fast heartbeat  . A bad rash all over your body  . Dizziness and weakness    Immunizations Administered    Name Date Dose VIS Date Route   Pfizer COVID-19 Vaccine 03/05/2019  2:29 PM 0.3 mL 12/15/2018 Intramuscular   Manufacturer: [place]   Lot: [id number]   [place]: [id number]

## 2019-03-13 ENCOUNTER — Other Ambulatory Visit: Payer: Self-pay

## 2019-03-13 NOTE — Progress Notes (Signed)
[place] at [facility] [address] [place], [place] [address] [phone] [id number] [phone]  Date:  03/14/2019   Name:  [name] [name]   DOB:  11/07/1945   MRN:  [id number]  PCP:  Darreld Mclean, MD    Chief Complaint: No chief complaint on file.   History of Present Illness:  [name] [name] is a 74 y.o. very pleasant female patient who presents with the following:  Virtual visit today to follow-up on IBS Patient with history of cerebral meningioma, osteoporosis, hyperlipidemia, IBS, pulmonary embolism  Last seen by myself in August 2020  She was diagnosed with a meningioma in 2019, underwent craniotomy and resection by Dr. Saintclair Halsted in September 2019.  Postop course was complicated by pulmonary embolism treated with Eliquis Her medical oncologist is Dr. Mickeal Skinner, she saw him in December 2020-unfortunately her meningioma in her left frontal region was progressing, she was referred to radiation oncology and underwent treatment through December/January. She saw her radiation oncologist, Dr. Isidore Moos for follow-up in February.  At this point she is to follow-up with them as needed; radiation is complete  She is complete her COVID-19 vaccine, her health maintenance is up-to-date Can catch up on complete labs at next in-person visit  She did have hair loss after the radiation, but we hope that this will grow back   She notes that she may have diarrhea/ fecal urgency after lunch sometimes- this has been going on for a couple of months She is using some OTC medications/supplements for IBS for this that do not help that much She is not aware of any relationship to particular foods She does not think that she has lactose intolerance  She is willing to see GI   She did have vomiting one night after her 2nd covid vaccine; this has not recurred Patient Active Problem List   Diagnosis Date Noted  . Meningioma, cerebral ([place]) 12/22/2018  . Acute pulmonary  embolism ([place]) 09/28/2017  . Dyspnea on exertion 09/28/2017  . Hypoxia 09/28/2017  . Meningioma ([place]) 09/07/2017  . FATIGUE 09/12/2009  . MYALGIA 10/31/2008  . Exertional dyspnea 10/31/2008  . Hyperlipidemia 11/11/2006  . OTHER SPECIFIED ANEMIAS 11/11/2006  . GERD 11/11/2006  . Osteoporosis 11/11/2006  . IRRITABLE BOWEL SYNDROME, HX OF 10/20/2006    Past Medical History:  Diagnosis Date  . Cancer ([place])    skin cancer   . History of radiation therapy 01/09/19- 01/19/19   SRS brain radiation. 5 fractions to total 25 Gy  . Hyperlipidemia   . Osteoporosis   . PONV (postoperative nausea and vomiting)    with breast surgery    Past Surgical History:  Procedure Laterality Date  . APPLICATION OF CRANIAL NAVIGATION N/A 09/07/2017   Procedure: APPLICATION OF CRANIAL NAVIGATION;  Surgeon: Kary Kos, MD;  Location: [place];  Service: Neurosurgery;  Laterality: N/A;  . BREAST ENHANCEMENT SURGERY    . BUNIONECTOMY    . COLONOSCOPY    . CRANIOTOMY N/A 09/07/2017   Procedure: CRAINIOTOMY BI CORONAL w/BRAINLAB;  Surgeon: Kary Kos, MD;  Location: [place];  Service: Neurosurgery;  Laterality: N/A;  . EYE SURGERY Bilateral    cataract surgery  . SKIN CANCER EXCISION    . WISDOM TOOTH EXTRACTION      Social History   Tobacco Use  . Smoking status: Never Smoker  . Smokeless tobacco: Never Used  Substance Use Topics  . Alcohol use: No  . Drug use: No  Family History  Problem Relation Age of Onset  . Breast cancer Mother   . Heart disease Father     Allergies  Allergen Reactions  . Bee Venom Swelling  . Losartan Itching    Medication list has been reviewed and updated.  Current Outpatient Medications on File Prior to Visit  Medication Sig Dispense Refill  . ezetimibe (ZETIA) 10 MG tablet TAKE 1 TABLET(10 MG) BY MOUTH DAILY 90 tablet 1   No current facility-administered medications on file prior to visit.    Review of Systems:  As per HPI- otherwise negative.   Physical  Examination: There were no vitals filed for this visit. There were no vitals filed for this visit. There is no height or weight on file to calculate BMI. Ideal Body Weight:    Spoke to pt on the phone today   Assessment and Plan: Dyslipidemia - Plan: Lipid panel  Essential hypertension - Plan: CBC, Comprehensive metabolic panel  Screening for deficiency anemia - Plan: CBC  Screening for diabetes mellitus - Plan: Hemoglobin A1c  Diarrhea, unspecified type - Plan: Ambulatory referral to Gastroenterology  Spoke with Inez Catalina on the phone today for about 8 minutes.  She is having some issues with fecal urgency and also is overdue for labs.  We decided to have her seen in the office tomorrow, and I have made her an appointment Place referral to GI for concern of fecal urgency This visit occurred during the SARS-CoV-2 public health emergency.  Safety protocols were in place, including screening questions prior to the visit, additional usage of staff PPE, and extensive cleaning of exam room while observing appropriate contact time as indicated for disinfecting solutions.    Signed Lamar Blinks, MD

## 2019-03-14 ENCOUNTER — Ambulatory Visit (INDEPENDENT_AMBULATORY_CARE_PROVIDER_SITE_OTHER): Payer: Medicare Other | Admitting: Family Medicine

## 2019-03-14 ENCOUNTER — Encounter: Payer: Self-pay | Admitting: Family Medicine

## 2019-03-14 DIAGNOSIS — E785 Hyperlipidemia, unspecified: Secondary | ICD-10-CM | POA: Diagnosis not present

## 2019-03-14 DIAGNOSIS — I10 Essential (primary) hypertension: Secondary | ICD-10-CM | POA: Diagnosis not present

## 2019-03-14 DIAGNOSIS — Z130 Encounter for screening for diseases of the blood and blood-forming organs and certain disorders involving the immune mechanism: Secondary | ICD-10-CM

## 2019-03-14 DIAGNOSIS — Z131 Encounter for screening for diabetes mellitus: Secondary | ICD-10-CM | POA: Diagnosis not present

## 2019-03-14 DIAGNOSIS — R197 Diarrhea, unspecified: Secondary | ICD-10-CM

## 2019-03-15 ENCOUNTER — Ambulatory Visit: Payer: Medicare Other | Admitting: Family Medicine

## 2019-03-15 ENCOUNTER — Encounter: Payer: Self-pay | Admitting: Gastroenterology

## 2019-03-15 ENCOUNTER — Other Ambulatory Visit: Payer: Self-pay

## 2019-03-15 ENCOUNTER — Encounter: Payer: Self-pay | Admitting: Family Medicine

## 2019-03-15 ENCOUNTER — Other Ambulatory Visit: Payer: Self-pay | Admitting: Radiation Therapy

## 2019-03-15 VITALS — BP 128/86 | HR 81 | Temp 97.5°F | Resp 16 | Ht 62.0 in | Wt 156.0 lb

## 2019-03-15 DIAGNOSIS — Z130 Encounter for screening for diseases of the blood and blood-forming organs and certain disorders involving the immune mechanism: Secondary | ICD-10-CM | POA: Diagnosis not present

## 2019-03-15 DIAGNOSIS — I10 Essential (primary) hypertension: Secondary | ICD-10-CM | POA: Diagnosis not present

## 2019-03-15 DIAGNOSIS — R197 Diarrhea, unspecified: Secondary | ICD-10-CM

## 2019-03-15 DIAGNOSIS — Z131 Encounter for screening for diabetes mellitus: Secondary | ICD-10-CM

## 2019-03-15 DIAGNOSIS — E785 Hyperlipidemia, unspecified: Secondary | ICD-10-CM

## 2019-03-15 LAB — COMPREHENSIVE METABOLIC PANEL
ALT: 13 U/L (ref 0–35)
AST: 18 U/L (ref 0–37)
Albumin: 4 g/dL (ref 3.5–5.2)
Alkaline Phosphatase: 75 U/L (ref 39–117)
BUN: 15 mg/dL (ref 6–23)
CO2: 29 mEq/L (ref 19–32)
Calcium: 9.8 mg/dL (ref 8.4–10.5)
Chloride: 104 mEq/L (ref 96–112)
Creatinine, Ser: 0.82 mg/dL (ref 0.40–1.20)
GFR: 68.68 mL/min (ref >60.00)
Glucose, Bld: 74 mg/dL (ref 70–99)
Potassium: 4.5 mEq/L (ref 3.5–5.1)
Sodium: 141 mEq/L (ref 135–145)
Total Bilirubin: 0.4 mg/dL (ref 0.2–1.2)
Total Protein: 6.8 g/dL (ref 6.0–8.3)

## 2019-03-15 LAB — CBC
HCT: 38.7 % (ref 36.0–46.0)
Hemoglobin: 13 g/dL (ref 12.0–15.0)
MCHC: 33.5 g/dL (ref 30.0–36.0)
MCV: 92.9 fl (ref 78.0–100.0)
Platelets: 265 10*3/uL (ref 150.0–400.0)
RBC: 4.17 Mil/uL (ref 3.87–5.11)
RDW: 13.2 % (ref 11.5–15.5)
WBC: 8.6 10*3/uL (ref 4.0–10.5)

## 2019-03-15 LAB — LIPID PANEL
Cholesterol: 247 mg/dL — ABNORMAL HIGH (ref 0–200)
HDL: 55.3 mg/dL (ref >39.00)
NonHDL: 192.09
Total CHOL/HDL Ratio: 4
Triglycerides: 214 mg/dL — ABNORMAL HIGH (ref 0.0–149.0)
VLDL: 42.8 mg/dL — ABNORMAL HIGH (ref 0.0–40.0)

## 2019-03-15 LAB — LDL CHOLESTEROL, DIRECT: Direct LDL: 157 mg/dL

## 2019-03-15 LAB — HEMOGLOBIN A1C: Hgb A1c MFr Bld: 5.4 % (ref 4.6–6.5)

## 2019-03-15 NOTE — Progress Notes (Addendum)
[place] at [facility] [place], [place], [place] [address] [phone]  Date:  03/15/2019   Name:  [name] [name]   DOB:  May 19, 1945   MRN:  [id number]  PCP:  Darreld Mclean, MD    Chief Complaint: Lab Work (diarrhea several weeks)   History of Present Illness:  [name] [name] is a 73 y.o. very pleasant female patient who presents with the following:  Here today for in person follow-up visit and labs We had a virtual visit earlier this week, and decided to bring her in as she is overdue for lab work  Patient with history of cerebral meningioma, osteoporosis, hyperlipidemia, IBS, pulmonary embolism  Last seen by myself in August 2020  She was diagnosed with a meningioma in 2019, underwent craniotomy and resection by Dr. Saintclair Halsted in September 2019.  Postop course was complicated by pulmonary embolism treated with Eliquis Her medical oncologist is Dr. Mickeal Skinner, she saw him in December 2020-unfortunately her meningioma in her left frontal region was progressing, she was referred to radiation oncology and underwent treatment through December/January. She saw her radiation oncologist, Dr. Isidore Moos for follow-up in February.  At this point she is to follow-up with them as needed; radiation is complete  She has completed COVID-19 vaccine, her health maintenance is up-to-date  She did have hair loss after the radiation, but we hope that this will grow back   She notes that she may have diarrhea/ fecal urgency after lunch sometimes- this has been going on for a couple of months She is using some OTC medications/supplements for IBS for this that do not help that much She is not aware of any relationship to particular foods She does not think that she has lactose intolerance  She is willing to see GI -referral has been placed  She is not aware of any family history of IBD She has tried imodium to no avail,  probiotics   Patient Active Problem List   Diagnosis Date Noted  . Meningioma, cerebral ([place]) 12/22/2018  . Acute pulmonary embolism ([place]) 09/28/2017  . Dyspnea on exertion 09/28/2017  . Hypoxia 09/28/2017  . Meningioma ([place]) 09/07/2017  . FATIGUE 09/12/2009  . MYALGIA 10/31/2008  . Exertional dyspnea 10/31/2008  . Hyperlipidemia 11/11/2006  . OTHER SPECIFIED ANEMIAS 11/11/2006  . GERD 11/11/2006  . Osteoporosis 11/11/2006  . IRRITABLE BOWEL SYNDROME, HX OF 10/20/2006    Past Medical History:  Diagnosis Date  . Cancer ([place])    skin cancer   . History of radiation therapy 01/09/19- 01/19/19   SRS brain radiation. 5 fractions to total 25 Gy  . Hyperlipidemia   . Osteoporosis   . PONV (postoperative nausea and vomiting)    with breast surgery    Past Surgical History:  Procedure Laterality Date  . APPLICATION OF CRANIAL NAVIGATION N/A 09/07/2017   Procedure: APPLICATION OF CRANIAL NAVIGATION;  Surgeon: Kary Kos, MD;  Location: [place];  Service: Neurosurgery;  Laterality: N/A;  . BREAST ENHANCEMENT SURGERY    . BUNIONECTOMY    . COLONOSCOPY    . CRANIOTOMY N/A 09/07/2017   Procedure: CRAINIOTOMY BI CORONAL w/BRAINLAB;  Surgeon: Kary Kos, MD;  Location: [facility];  Service: Neurosurgery;  Laterality: N/A;  . EYE SURGERY Bilateral    cataract surgery  . SKIN CANCER EXCISION    . WISDOM TOOTH EXTRACTION      Social History   Tobacco Use  . Smoking status: Never Smoker  .  Smokeless tobacco: Never Used  Substance Use Topics  . Alcohol use: No  . Drug use: No    Family History  Problem Relation Age of Onset  . Breast cancer Mother   . Heart disease Father     Allergies  Allergen Reactions  . Bee Venom Swelling  . Losartan Itching    Medication list has been reviewed and updated.  Current Outpatient Medications on File Prior to Visit  Medication Sig Dispense Refill  . Calcium 500-125 MG-UNIT TABS Take by mouth.    . ezetimibe (ZETIA) 10 MG tablet TAKE 1  TABLET(10 MG) BY MOUTH DAILY 90 tablet 1  . Multiple Vitamins-Minerals (HAIR SKIN NAILS PO) Take by mouth.    . Prenatal MV & Min w/FA-DHA (PRENATAL ADULT GUMMY/DHA/FA PO) Take by mouth.     No current facility-administered medications on file prior to visit.    Review of Systems:  As per HPI- otherwise negative.    Physical Examination: Vitals:   03/15/19 1313  BP: 128/86  Pulse: 81  Resp: 16  Temp: (!) 97.5 F (36.4 C)  SpO2: 97%   Vitals:   03/15/19 1313  Weight: 156 lb (70.8 kg)  Height: 5\' 2"  (1.575 m)   Body mass index is 28.53 kg/m. Ideal Body Weight: Weight in (lb) to have BMI = 25: 136.4  GEN: no acute distress.  Mild overweight, looks well HEENT: Atraumatic, Normocephalic.   Bilateral TM wnl, oropharynx normal.  PEERL,EOMI.   Ears and Nose: No external deformity. CV: RRR, No M/G/R. No JVD. No thrill. No extra heart sounds. PULM: CTA B, no wheezes, crackles, rhonchi. No retractions. No resp. distress. No accessory muscle use. ABD: S, NT, ND, +BS. No rebound. No HSM. EXTR: No c/c/e PSYCH: Normally interactive. Conversant.    Assessment and Plan: Dyslipidemia - Plan: Lipid panel  Essential hypertension - Plan: Comprehensive metabolic panel, CBC  Screening for deficiency anemia - Plan: CBC  Screening for diabetes mellitus - Plan: Hemoglobin A1c  Diarrhea, unspecified type  Following up in person today.  We will draw lab work as above Referral has been placed to gastroenterology for concern of loose stools and fecal urgency Lab orders are already in- will draw today   This visit occurred during the SARS-CoV-2 public health emergency.  Safety protocols were in place, including screening questions prior to the visit, additional usage of staff PPE, and extensive cleaning of exam room while observing appropriate contact time as indicated for disinfecting solutions.    Signed Lamar Blinks, MD  Received her labs as below, letter to patient  Results  for orders placed or performed in visit on 03/15/19  Comprehensive metabolic panel  Result Value Ref Range   Sodium 141 135 - 145 mEq/L   Potassium 4.5 3.5 - 5.1 mEq/L   Chloride 104 96 - 112 mEq/L   CO2 29 19 - 32 mEq/L   Glucose, Bld 74 70 - 99 mg/dL   BUN 15 6 - 23 mg/dL   Creatinine, Ser 0.82 0.40 - 1.20 mg/dL   Total Bilirubin 0.4 0.2 - 1.2 mg/dL   Alkaline Phosphatase 75 39 - 117 U/L   AST 18 0 - 37 U/L   ALT 13 0 - 35 U/L   Total Protein 6.8 6.0 - 8.3 g/dL   Albumin 4.0 3.5 - 5.2 g/dL   GFR 68.68 >60.00 mL/min   Calcium 9.8 8.4 - 10.5 mg/dL  Hemoglobin A1c  Result Value Ref Range   Hgb A1c MFr Bld  5.4 4.6 - 6.5 %  Lipid panel  Result Value Ref Range   Cholesterol 247 (H) 0 - 200 mg/dL   Triglycerides 214.0 (H) 0.0 - 149.0 mg/dL   HDL 55.30 >39.00 mg/dL   VLDL 42.8 (H) 0.0 - 40.0 mg/dL   Total CHOL/HDL Ratio 4    NonHDL 192.09   CBC  Result Value Ref Range   WBC 8.6 4.0 - 10.5 K/uL   RBC 4.17 3.87 - 5.11 Mil/uL   Platelets 265.0 150.0 - 400.0 K/uL   Hemoglobin 13.0 12.0 - 15.0 g/dL   HCT 38.7 36.0 - 46.0 %   MCV 92.9 78.0 - 100.0 fl   MCHC 33.5 30.0 - 36.0 g/dL   RDW 13.2 11.5 - 15.5 %  LDL cholesterol, direct  Result Value Ref Range   Direct LDL 157.0 mg/dL

## 2019-03-15 NOTE — Patient Instructions (Addendum)
It was good to see you again today, I will be in touch with your labs as soon as possible  I will look forward to seeing your GI note next week

## 2019-03-22 ENCOUNTER — Other Ambulatory Visit: Payer: Self-pay

## 2019-03-22 ENCOUNTER — Encounter: Payer: Self-pay | Admitting: Gastroenterology

## 2019-03-22 ENCOUNTER — Ambulatory Visit: Payer: Medicare Other | Admitting: Gastroenterology

## 2019-03-22 ENCOUNTER — Telehealth: Payer: Self-pay | Admitting: Gastroenterology

## 2019-03-22 VITALS — BP 130/88 | HR 80 | Temp 97.5°F | Ht 62.0 in | Wt 157.2 lb

## 2019-03-22 DIAGNOSIS — R197 Diarrhea, unspecified: Secondary | ICD-10-CM | POA: Diagnosis not present

## 2019-03-22 MED ORDER — DICYCLOMINE HCL 10 MG PO CAPS: ORAL_CAPSULE | ORAL | 3 refills | Status: DC

## 2019-03-22 NOTE — Telephone Encounter (Signed)
Please review previous message and advise 

## 2019-03-22 NOTE — Telephone Encounter (Signed)
Thanks for letting us know Can proceed with colon  RG

## 2019-03-22 NOTE — Progress Notes (Signed)
Chief Complaint:   Referring Provider:  Darreld Mclean, MD      ASSESSMENT AND PLAN;   #1. Diarrhea. D/d includes IBD-D, infectious causes, meds (started on vitamins for hair growth after XRT), microscopic colitis, IBD, malabsorption, celiac disease. Doubt hypothyroidism with Nl TSH 06/2018.  #2.  Comorbid conditions include H/O meningioma 2019 s/p resection (Dr Saintclair Halsted) followed by XRT for L frontal recurrence.   Plan: -Bentyl 10mg  po bid, 1/2hr before lunch and bed time #60. Watch for dizziness -Stool studies for GI Pathogen (includes C. Diff), O&P, fecal elastase, fat and Calprotectin) -Proceed with colonoscopy with miralax. Discussed risks & benefits. (Risks including rare perforation req laparotomy, bleeding after bx/polypectomy req blood transfusion, rarely missing neoplasms, risks of anesthesia/sedation). Benefits outweigh the risks. Patient agrees to proceed. All the questions were answered. Consent forms given for review. -She is to bring the vitamins along at next visit.  Need to make sure there is no magnesium in those.    HPI:    [name] [name] is a 74 y.o. female  Dx with IBS-D, underwent colonoscopy by Dr. Amedeo Plenty over 10 years ago. Has been having more abdo bloating, diarrhea  X last 2-3 months (BMs 3-6/day, mushy), especially after lunch.  Associated with urgency and mid-lower abdominal discomfort.  Has significant mucus in the stools.  This is to the extent that now she is afraid to go out and eat.  No history suggestive of lactose intolerance.  Does have occasional nocturnal symptoms.  No melena or hematochezia.  Had one episode of nausea/vomiting which has resolved.  She has taken multiple medications for IBS including IBgard, Imodium without any significant relief.  Denies being on any antibiotics recently.  She was diagnosed with a meningioma in 2019, underwent craniotomy and resection by Dr. Saintclair Halsted in September 2019. Postop course was complicated by  pulmonary embolism treated with Eliquis x 6 months, now with recurrence s/p XRT.  Past Medical History:  Diagnosis Date  . Cancer ([place])    skin cancer   . History of radiation therapy 01/09/19- 01/19/19   SRS brain radiation. 5 fractions to total 25 Gy  . Hyperlipidemia   . IBS (irritable bowel syndrome)   . Osteoporosis   . PONV (postoperative nausea and vomiting)    with breast surgery    Past Surgical History:  Procedure Laterality Date  . APPLICATION OF CRANIAL NAVIGATION N/A 09/07/2017   Procedure: APPLICATION OF CRANIAL NAVIGATION;  Surgeon: Kary Kos, MD;  Location: [facility];  Service: Neurosurgery;  Laterality: N/A;  . BRAIN SURGERY  09/2017  . BUNIONECTOMY    . COLONOSCOPY     [facility] Dr Amedeo Plenty probably about 10 years ago  . CRANIOTOMY N/A 09/07/2017   Procedure: CRAINIOTOMY BI CORONAL w/BRAINLAB;  Surgeon: Kary Kos, MD;  Location: [facility];  Service: Neurosurgery;  Laterality: N/A;  . EYE SURGERY Bilateral    cataract surgery  . SKIN CANCER EXCISION    . WISDOM TOOTH EXTRACTION      Family History  Problem Relation Age of Onset  . Breast cancer Mother   . Heart disease Father   . Colon cancer Maternal Aunt     Social History   Tobacco Use  . Smoking status: Never Smoker  . Smokeless tobacco: Never Used  Substance Use Topics  . Alcohol use: No  . Drug use: No    Current Outpatient Medications  Medication Sig Dispense Refill  . AMBULATORY NON FORMULARY MEDICATION 1 tablet daily. Fermented Beets  500mg     . Calcium 500-125 MG-UNIT TABS Take 1 tablet by mouth daily. Chewable    . ezetimibe (ZETIA) 10 MG tablet TAKE 1 TABLET(10 MG) BY MOUTH DAILY 90 tablet 1  . Multiple Vitamins-Minerals (HAIR SKIN NAILS PO) Take 1 tablet by mouth daily. Chewables    . Prenatal MV & Min w/FA-DHA (PRENATAL ADULT GUMMY/DHA/FA PO) Take 1 tablet by mouth daily.      No current facility-administered medications for this visit.    Allergies  Allergen Reactions  . Bee Venom Swelling    . Losartan Itching    Review of Systems:  Constitutional: Denies fever, chills, diaphoresis, appetite change and has fatigue.  HEENT: Denies photophobia, eye pain, redness, hearing loss, ear pain, congestion, sore throat, rhinorrhea, sneezing, mouth sores, neck pain, neck stiffness and tinnitus.   Respiratory: Denies SOB, DOE, cough, chest tightness,  and wheezing.   Cardiovascular: Denies chest pain, palpitations and leg swelling.  Genitourinary: Denies dysuria, urgency, frequency, hematuria, flank pain and difficulty urinating.  Musculoskeletal: Denies myalgias, has back pain, joint swelling, arthralgias and gait problem.  Skin: No rash.  Neurological: Denies dizziness, seizures, syncope, weakness, light-headedness, numbness and occ headaches.  Hematological: Denies adenopathy. Easy bruising, personal or family bleeding history  Psychiatric/Behavioral: No anxiety or depression     Physical Exam:    BP 130/88   Pulse 80   Temp (!) 97.5 F (36.4 C)   Ht 5\' 2"  (1.575 m)   Wt 157 lb 4 oz (71.3 kg)   BMI 28.76 kg/m  Wt Readings from Last 3 Encounters:  03/22/19 157 lb 4 oz (71.3 kg)  03/15/19 156 lb (70.8 kg)  12/18/18 159 lb 3.2 oz (72.2 kg)   Constitutional:  Well-developed, in no acute distress. Psychiatric: Normal mood and affect. Behavior is normal. HEENT: Pupils normal.  Conjunctivae are normal. No scleral icterus. Neck supple.  Cardiovascular: Normal rate, regular rhythm. No edema Pulmonary/chest: Effort normal and breath sounds normal. No wheezing, rales or rhonchi. Abdominal: Soft, nondistended. Nontender. Bowel sounds active throughout. There are no masses palpable. No hepatomegaly. Rectal:  defered Neurological: Alert and oriented to person place and time. Skin: Skin is warm and dry. No rashes noted.  Data Reviewed: I have personally reviewed following labs and imaging studies  CBC: CBC Latest Ref Rng & Units 03/15/2019 06/12/2018 10/01/2017  WBC 4.0 - 10.5 K/uL  8.6 8.0 8.1  Hemoglobin 12.0 - 15.0 g/dL 13.0 13.5 10.4(L)  Hematocrit 36.0 - 46.0 % 38.7 40.4 32.7(L)  Platelets 150.0 - 400.0 K/uL 265.0 260.0 172    CMP: CMP Latest Ref Rng & Units 03/15/2019 12/26/2018 06/12/2018  Glucose 70 - 99 mg/dL 74 - 81  BUN 6 - 23 mg/dL 15 16 14   Creatinine 0.40 - 1.20 mg/dL 0.82 0.84 0.78  Sodium 135 - 145 mEq/L 141 - 139  Potassium 3.5 - 5.1 mEq/L 4.5 - 4.4  Chloride 96 - 112 mEq/L 104 - 102  CO2 19 - 32 mEq/L 29 - 27  Calcium 8.4 - 10.5 mg/dL 9.8 - 9.8  Total Protein 6.0 - 8.3 g/dL 6.8 - 7.1  Total Bilirubin 0.2 - 1.2 mg/dL 0.4 - 0.5  Alkaline Phos 39 - 117 U/L 75 - 75  AST 0 - 37 U/L 18 - 15  ALT 0 - 35 U/L 13 - 11    GFR: Estimated Creatinine Clearance: 58.2 mL/min (by C-G formula based on SCr of 0.82 mg/dL). Liver Function Tests: Recent Labs  Lab 03/15/19 1338  AST  18  ALT 13  ALKPHOS 75  BILITOT 0.4  PROT 6.8  ALBUMIN 4.0  prev records were reviewed   Carmell Austria, MD 03/22/2019, 10:41 AM  Cc: Copland, Gay Filler, MD

## 2019-03-22 NOTE — Patient Instructions (Addendum)
If you are age 72 or older, your body mass index should be between 23-30. Your Body mass index is 28.76 kg/m. If this is out of the aforementioned range listed, please consider follow up with your Primary Care Provider.  If you are age 35 or younger, your body mass index should be between 19-25. Your Body mass index is 28.76 kg/m. If this is out of the aformentioned range listed, please consider follow up with your Primary Care Provider.   It has been recommended to you by your physician that you have a(n) Colonoscopy completed. Per your request, we did not schedule the procedure(s) today. Please contact our office at [phone] should you decide to have the procedure completed. You will be scheduled for a pre-visit and procedure at that time.   We have sent the following medications to your pharmacy for you to pick up at your convenience: Bentyl   Please go to the lab at [facility] ([place] You will need to go to level "B", you do not need an appointment for this. Hours available are 7:30 am - 4:30 pm.   Thank you,  Dr. Jackquline Denmark

## 2019-03-23 NOTE — Telephone Encounter (Signed)
I have called patient and left message for her to return my call.  

## 2019-04-03 ENCOUNTER — Other Ambulatory Visit: Payer: Medicare Other

## 2019-04-05 ENCOUNTER — Other Ambulatory Visit: Payer: Medicare Other

## 2019-04-05 DIAGNOSIS — R197 Diarrhea, unspecified: Secondary | ICD-10-CM | POA: Diagnosis not present

## 2019-04-09 LAB — GI PROFILE, STOOL, PCR

## 2019-04-12 LAB — OVA AND PARASITE EXAMINATION
CONCENTRATE RESULT:: NONE SEEN
MICRO NUMBER:: 10317627
SPECIMEN QUALITY:: ADEQUATE
TRICHROME RESULT:: NONE SEEN

## 2019-04-14 LAB — CALPROTECTIN: Calprotectin: 9 mcg/g

## 2019-04-14 LAB — FECAL FAT, QUALITATIVE: FECAL FAT, QUALITATIVE: NORMAL

## 2019-04-14 LAB — PANCREATIC ELASTASE, FECAL: Pancreatic Elastase-1, Stool: 448 mcg/g

## 2019-04-19 ENCOUNTER — Other Ambulatory Visit: Payer: Self-pay

## 2019-04-19 ENCOUNTER — Ambulatory Visit
Admission: RE | Admit: 2019-04-19 | Discharge: 2019-04-19 | Disposition: A | Discharge status: Home / Self Care | Payer: Medicare Other | Source: Ambulatory Visit | Attending: Internal Medicine | Admitting: Internal Medicine

## 2019-04-19 DIAGNOSIS — D320 Benign neoplasm of cerebral meninges: Secondary | ICD-10-CM | POA: Diagnosis not present

## 2019-04-19 DIAGNOSIS — D329 Benign neoplasm of meninges, unspecified: Secondary | ICD-10-CM

## 2019-04-19 MED ORDER — GADOBENATE DIMEGLUMINE 529 MG/ML IV SOLN
13.0000 mL | Freq: Once | INTRAVENOUS | Status: AC | PRN
  Administered 2019-04-19: 13 mL via INTRAVENOUS

## 2019-04-23 ENCOUNTER — Inpatient Hospital Stay: Payer: Medicare Other | Attending: Internal Medicine | Admitting: Internal Medicine

## 2019-04-23 ENCOUNTER — Other Ambulatory Visit: Payer: Self-pay

## 2019-04-23 VITALS — BP 153/87 | HR 90 | Temp 98.2°F | Resp 18 | Ht 62.0 in | Wt 155.3 lb

## 2019-04-23 DIAGNOSIS — K589 Irritable bowel syndrome without diarrhea: Secondary | ICD-10-CM | POA: Insufficient documentation

## 2019-04-23 DIAGNOSIS — Z800 Family history of malignant neoplasm of digestive organs: Secondary | ICD-10-CM | POA: Diagnosis not present

## 2019-04-23 DIAGNOSIS — Z7901 Long term (current) use of anticoagulants: Secondary | ICD-10-CM | POA: Diagnosis not present

## 2019-04-23 DIAGNOSIS — D329 Benign neoplasm of meninges, unspecified: Secondary | ICD-10-CM

## 2019-04-23 DIAGNOSIS — R519 Headache, unspecified: Secondary | ICD-10-CM | POA: Diagnosis not present

## 2019-04-23 DIAGNOSIS — Z8249 Family history of ischemic heart disease and other diseases of the circulatory system: Secondary | ICD-10-CM | POA: Diagnosis not present

## 2019-04-23 DIAGNOSIS — Z85828 Personal history of other malignant neoplasm of skin: Secondary | ICD-10-CM | POA: Insufficient documentation

## 2019-04-23 DIAGNOSIS — Z803 Family history of malignant neoplasm of breast: Secondary | ICD-10-CM | POA: Insufficient documentation

## 2019-04-23 DIAGNOSIS — R0989 Other specified symptoms and signs involving the circulatory and respiratory systems: Secondary | ICD-10-CM | POA: Diagnosis not present

## 2019-04-23 DIAGNOSIS — Z79899 Other long term (current) drug therapy: Secondary | ICD-10-CM | POA: Diagnosis not present

## 2019-04-23 DIAGNOSIS — D320 Benign neoplasm of cerebral meninges: Secondary | ICD-10-CM | POA: Insufficient documentation

## 2019-04-23 DIAGNOSIS — Z596 Low income: Secondary | ICD-10-CM | POA: Diagnosis not present

## 2019-04-23 NOTE — Progress Notes (Signed)
[facility] [place], [place] [address] [phone]   Interval Evaluation  Date of Service: 04/23/19 Patient Name: [name] [name] Patient MRN: [id number] Patient DOB: 1945-01-07 Provider: Ventura Sellers, MD  Identifying Statement:  [name] [name] is a 74 y.o. 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 Saintclair Halsted.  Path is WHO grade I meningioma 01/19/19: fx SRS with Dr. Isidore Moos 25/5  Interval History:  [name] [name] presents for follow up after recent MRI brain.  She describes no new or progressive neurologic deficits.  She has no visual changes. No seizures or headaches.   H+P (10/19/17) Patient presented to medical attention initially in August after complaints of loss of short term memory, depressed affect, and new onset headaches.  MRI demonstrated 2 lesions c/w meningioma, one in the left frontal lobe and another tumor in the left cavernous sinus and orbital apex.  She denied visual symptoms including double vision.  Dr. Saintclair Halsted resected the left frontal tumor on 09/07/17, and following surgery she described clinical improvement.  Her memory is still "not 100%" but she feels more like her "normal self".  Patient and husband deny any behavioral sytmpoms or seizures.  Headaches have resolved.  Unfortunately 2-3 weeks after surgery she developed shortness of breath and was found to have a pulmonary embolism.  She was started on Eliquis and has been taking PRN oxygen, although respiratory symptoms have improved dramatically.    Medications: Current Outpatient Medications on File Prior to Visit  Medication Sig Dispense Refill  . AMBULATORY NON FORMULARY MEDICATION 1 tablet daily. Fermented Beets 500mg     . Calcium 500-125 MG-UNIT TABS Take 1  tablet by mouth daily. Chewable    . dicyclomine (BENTYL) 10 MG capsule Take one by moth 1/2 hour before lunch and at bedtime. 60 capsule 3  . ezetimibe (ZETIA) 10 MG tablet TAKE 1 TABLET(10 MG) BY MOUTH DAILY 90 tablet 1  . Multiple Vitamins-Minerals (HAIR SKIN NAILS PO) Take 1 tablet by mouth daily. Chewables    . Prenatal MV & Min w/FA-DHA (PRENATAL ADULT GUMMY/DHA/FA PO) Take 1 tablet by mouth daily.      No current facility-administered medications on file prior to visit.    Allergies:  Allergies  Allergen Reactions  . Bee Venom Swelling  . Losartan Itching   Past Medical History:  Past Medical History:  Diagnosis Date  . Cancer ([place])    skin cancer   . History of radiation therapy 01/09/19- 01/19/19   SRS brain radiation. 5 fractions to total 25 Gy  . Hyperlipidemia   . IBS (irritable bowel syndrome)   . Osteoporosis   . PONV (postoperative nausea and vomiting)    with breast surgery   Past Surgical History:  Past Surgical History:  Procedure Laterality Date  . APPLICATION OF CRANIAL NAVIGATION N/A 09/07/2017   Procedure: APPLICATION OF CRANIAL NAVIGATION;  Surgeon: Kary Kos, MD;  Location: [facility];  Service: Neurosurgery;  Laterality: N/A;  . BRAIN SURGERY  09/2017  . BUNIONECTOMY    . COLONOSCOPY     [facility] Dr Amedeo Plenty probably about 10 years ago  . CRANIOTOMY N/A 09/07/2017   Procedure: CRAINIOTOMY BI CORONAL w/BRAINLAB;  Surgeon: Kary Kos, MD;  Location: [facility];  Service: Neurosurgery;  Laterality: N/A;  . EYE SURGERY Bilateral  cataract surgery  . SKIN CANCER EXCISION    . WISDOM TOOTH EXTRACTION     Social History:  Social History   Socioeconomic History  . Marital status: Single    Spouse name: Not on file  . Number of children: Not on file  . Years of education: Not on file  . Highest education level: Not on file  Occupational History  . Not on file  Tobacco Use  . Smoking status: Never Smoker  . Smokeless tobacco: Never Used  Substance and Sexual  Activity  . Alcohol use: No  . Drug use: No  . Sexual activity: Not on file  Other Topics Concern  . Not on file  Social History Narrative   Lives home with home.  Not working.  Education:  HS grad.  [name].     Social Determinants of Health   Financial Resource Strain:   . Difficulty of Paying Living Expenses:   Food Insecurity:   . Worried About Charity fundraiser in the Last Year:   . Arboriculturist in the Last Year:   Transportation Needs:   . Film/video editor (Medical):   Marland Kitchen Lack of Transportation (Non-Medical):   Physical Activity:   . Days of Exercise per Week:   . Minutes of Exercise per Session:   Stress:   . Feeling of Stress :   Social Connections:   . Frequency of Communication with Friends and Family:   . Frequency of Social Gatherings with Friends and Family:   . Attends Religious Services:   . Active Member of Clubs or Organizations:   . Attends Archivist Meetings:   Marland Kitchen Marital Status:   Intimate Partner Violence:   . Fear of Current or Ex-Partner:   . Emotionally Abused:   Marland Kitchen Physically Abused:   . Sexually Abused:    Family History:  Family History  Problem Relation Age of Onset  . Breast cancer Mother   . Heart disease Father   . Colon cancer Maternal Aunt     Review of Systems: Constitutional: Denies fevers, chills or abnormal weight loss Eyes: Denies blurriness of vision Ears, nose, mouth, throat, and face: Denies mucositis or sore throat Respiratory: no dyspnea Cardiovascular: Denies palpitation, chest discomfort or lower extremity swelling Gastrointestinal:  Denies nausea, constipation, diarrhea GU: Denies dysuria or incontinence Skin: Denies abnormal skin rashes Neurological: Per HPI Musculoskeletal: Denies joint pain, back or neck discomfort. No decrease in ROM Behavioral/Psych: Denies anxiety, disturbance in thought content, and mood instability  Physical Exam: Vitals:   04/23/19 1039  BP: (!) 153/87    Pulse: 90  Resp: 18  Temp: 98.2 F (36.8 C)  SpO2: 98%   KPS: 90. General: Alert, cooperative, pleasant, in no acute distress Head: Normal EENT: No conjunctival injection or scleral icterus. Oral mucosa moist Lungs: Resp effort normal Cardiac: Regular rate and rhythm Abdomen: Non-distended abdomen Skin: No rashes cyanosis or petechiae. Extremities: No clubbing or edema  Neurologic Exam: Mental Status: Awake, alert, attentive to examiner. Oriented to self and environment. Language is fluent with intact comprehension.  Cranial Nerves: Visual acuity is grossly normal. Visual fields are full. Extra-ocular movements intact. No ptosis. Face is symmetric, tongue midline. Motor: Tone and bulk are normal. Power is full in both arms and legs. Reflexes are symmetric, no pathologic reflexes present. Intact finger to nose bilaterally Sensory: Intact to light touch and temperature Gait: Normal  Labs: I have reviewed the data as listed  Component Value Date/Time   NA 141 03/15/2019 1338   NA 141 07/15/2015 0000   NA 141 07/15/2015 0000   K 4.5 03/15/2019 1338   CL 104 03/15/2019 1338   CO2 29 03/15/2019 1338   GLUCOSE 74 03/15/2019 1338   BUN 15 03/15/2019 1338   BUN 22 (A) 07/15/2015 0000   BUN 22 (A) 07/15/2015 0000   CREATININE 0.82 03/15/2019 1338   CREATININE 0.84 12/26/2018 1217   CALCIUM 9.8 03/15/2019 1338   PROT 6.8 03/15/2019 1338   ALBUMIN 4.0 03/15/2019 1338   AST 18 03/15/2019 1338   ALT 13 03/15/2019 1338   ALKPHOS 75 03/15/2019 1338   BILITOT 0.4 03/15/2019 1338   GFRNONAA >60 12/26/2018 1217   GFRAA >60 12/26/2018 1217   Lab Results  Component Value Date   WBC 8.6 03/15/2019   NEUTROABS 7.5 (H) 09/28/2017   HGB 13.0 03/15/2019   HCT 38.7 03/15/2019   MCV 92.9 03/15/2019   PLT 265.0 03/15/2019   Imaging:  [place] Clinician Interpretation: I have personally reviewed the CNS images as listed.  My interpretation, in the context of the patient's clinical  presentation, is progressive disease  MR Brain W Wo Contrast  Result Date: 04/20/2019 CLINICAL DATA:  Left frontal meningioma postop resection and SRS. Also with left cavernous sinus meningioma. EXAM: MRI HEAD WITHOUT AND WITH CONTRAST TECHNIQUE: Multiplanar, multiecho pulse sequences of the brain and surrounding structures were obtained without and with intravenous contrast. CONTRAST:  58mL MULTIHANCE GADOBENATE DIMEGLUMINE 529 MG/ML IV SOLN COMPARISON:  MRI head 12/15/2018, MRI 08/18/2018 FINDINGS: Brain: Bifrontal craniotomy for resection of left frontal meningioma over the convexity. The enhancing tumor shows interval enlargement. The mass shows dense enhancement with dural tail. On coronal images the mass now measures 17 x 22 mm, compared with 16 x 19 mm previously. Dural tail extends to the anterior superior sagittal sinus which likely is occluded by tumor. There is progression of vasogenic edema in the left frontal white matter with increased mass-effect on the left frontal horn which is depressed by edema. Second lesion in the left cavernous sinus also shows enhancement and spread consistent with meningioma. This is unchanged in size. The tumor extends into the sella and encases the left cavernous carotid which remains patent. On coronal images this mass now measures 32 x 32 mm, unchanged from the prior study. Infundibulum and pituitary are displaced to the right by tumor. Mild compression of the left optic nerve appears unchanged. Negative for acute infarct. Vascular: Normal arterial flow voids. Tumor occluding the anterior superior sagittal sinus unchanged from the prior study. Skull and upper cervical spine: Bifrontal craniotomy. No acute skeletal abnormality. Sinuses/Orbits: Paranasal sinuses clear. Bilateral cataract extraction Other: None IMPRESSION: 1. Left frontal meningioma shows interval growth and progression of vasogenic edema and mass-effect. Tumor extends into the superior sagittal sinus  which appears occluded anteriorly. 2. Left cavernous sinus meningioma extending into the sella unchanged from the prior MRI. Electronically Signed   By: Franchot Gallo M.D.   On: 04/20/2019 08:59     Assessment/Plan 1. Meningioma [facility])  [name] [name] is clinically stable today now having completed radiosurgery. MRI demonstrates further progression of disease within treated left frontal region; etiology is suspected radioinflammatory effect vs (less likely) refractory organic tumor.  Cavernous sinus mass is again stable.   We recommended withholding steroids due to lack of symptoms, but will require closer MRI monitoring.  We ask that [name] [name] return to clinic in 3 months following next brain  MRI, or sooner as needed.  We appreciate the opportunity to participate in the care of [name] [name].    All questions were answered. The patient knows to call the clinic with any problems, questions or concerns. No barriers to learning were detected.  The total time spent in the encounter was 40 minutes and more than 50% was on counseling and review of test results   Ventura Sellers, MD Medical Director of Neuro-Oncology [facility] at [place] 04/23/19 3:41 PM

## 2019-04-25 ENCOUNTER — Telehealth: Payer: Self-pay | Admitting: Internal Medicine

## 2019-04-25 NOTE — Telephone Encounter (Signed)
Scheduled per los. Called and left msg. Mailed printout  °

## 2019-05-16 DIAGNOSIS — L570 Actinic keratosis: Secondary | ICD-10-CM | POA: Diagnosis not present

## 2019-05-16 DIAGNOSIS — L910 Hypertrophic scar: Secondary | ICD-10-CM | POA: Diagnosis not present

## 2019-05-16 DIAGNOSIS — Z85828 Personal history of other malignant neoplasm of skin: Secondary | ICD-10-CM | POA: Diagnosis not present

## 2019-05-16 DIAGNOSIS — L820 Inflamed seborrheic keratosis: Secondary | ICD-10-CM | POA: Diagnosis not present

## 2019-05-17 DIAGNOSIS — H0102A Squamous blepharitis right eye, upper and lower eyelids: Secondary | ICD-10-CM | POA: Diagnosis not present

## 2019-05-17 DIAGNOSIS — H0102B Squamous blepharitis left eye, upper and lower eyelids: Secondary | ICD-10-CM | POA: Diagnosis not present

## 2019-05-17 DIAGNOSIS — H40013 Open angle with borderline findings, low risk, bilateral: Secondary | ICD-10-CM | POA: Diagnosis not present

## 2019-05-17 DIAGNOSIS — Z961 Presence of intraocular lens: Secondary | ICD-10-CM | POA: Diagnosis not present

## 2019-05-29 ENCOUNTER — Other Ambulatory Visit: Payer: Self-pay | Admitting: Radiation Therapy

## 2019-05-29 ENCOUNTER — Telehealth: Payer: Self-pay | Admitting: *Deleted

## 2019-05-29 NOTE — Telephone Encounter (Signed)
Patient called to advise that she got her MRI scheduled, however, she scheduled about a month early.  Patient is having a harder time with confusion and balance.  Left MRI as she had scheduled.

## 2019-06-10 ENCOUNTER — Ambulatory Visit
Admission: RE | Admit: 2019-06-10 | Discharge: 2019-06-10 | Disposition: A | Discharge status: Home / Self Care | Payer: Medicare Other | Source: Ambulatory Visit | Attending: Internal Medicine | Admitting: Internal Medicine

## 2019-06-10 DIAGNOSIS — D329 Benign neoplasm of meninges, unspecified: Secondary | ICD-10-CM | POA: Diagnosis not present

## 2019-06-10 MED ORDER — GADOBENATE DIMEGLUMINE 529 MG/ML IV SOLN
14.0000 mL | Freq: Once | INTRAVENOUS | Status: AC | PRN
  Administered 2019-06-10: 14 mL via INTRAVENOUS

## 2019-06-12 ENCOUNTER — Other Ambulatory Visit: Payer: Self-pay

## 2019-06-12 ENCOUNTER — Other Ambulatory Visit: Payer: Self-pay | Admitting: Internal Medicine

## 2019-06-12 ENCOUNTER — Inpatient Hospital Stay: Payer: Medicare Other | Attending: Internal Medicine | Admitting: Internal Medicine

## 2019-06-12 VITALS — BP 143/93 | HR 81 | Temp 97.9°F | Resp 18 | Wt 151.9 lb

## 2019-06-12 DIAGNOSIS — R0989 Other specified symptoms and signs involving the circulatory and respiratory systems: Secondary | ICD-10-CM | POA: Diagnosis not present

## 2019-06-12 DIAGNOSIS — Z803 Family history of malignant neoplasm of breast: Secondary | ICD-10-CM | POA: Insufficient documentation

## 2019-06-12 DIAGNOSIS — Z800 Family history of malignant neoplasm of digestive organs: Secondary | ICD-10-CM | POA: Insufficient documentation

## 2019-06-12 DIAGNOSIS — Q381 Ankyloglossia: Secondary | ICD-10-CM | POA: Diagnosis not present

## 2019-06-12 DIAGNOSIS — R519 Headache, unspecified: Secondary | ICD-10-CM | POA: Insufficient documentation

## 2019-06-12 DIAGNOSIS — D320 Benign neoplasm of cerebral meninges: Secondary | ICD-10-CM | POA: Insufficient documentation

## 2019-06-12 DIAGNOSIS — D329 Benign neoplasm of meninges, unspecified: Secondary | ICD-10-CM

## 2019-06-12 DIAGNOSIS — Z7901 Long term (current) use of anticoagulants: Secondary | ICD-10-CM | POA: Diagnosis not present

## 2019-06-12 DIAGNOSIS — R0602 Shortness of breath: Secondary | ICD-10-CM | POA: Diagnosis not present

## 2019-06-12 DIAGNOSIS — Z85828 Personal history of other malignant neoplasm of skin: Secondary | ICD-10-CM | POA: Diagnosis not present

## 2019-06-12 DIAGNOSIS — Z79899 Other long term (current) drug therapy: Secondary | ICD-10-CM | POA: Insufficient documentation

## 2019-06-12 DIAGNOSIS — I2699 Other pulmonary embolism without acute cor pulmonale: Secondary | ICD-10-CM | POA: Insufficient documentation

## 2019-06-12 DIAGNOSIS — Z8249 Family history of ischemic heart disease and other diseases of the circulatory system: Secondary | ICD-10-CM | POA: Insufficient documentation

## 2019-06-12 MED ORDER — PENTOXIFYLLINE ER 400 MG PO TBCR
400.0000 mg | EXTENDED_RELEASE_TABLET | Freq: Three times a day (TID) | ORAL | 1 refills | Status: DC
Start: 2019-06-12 — End: 2019-06-12

## 2019-06-12 MED ORDER — DEXAMETHASONE 4 MG PO TABS
4.0000 mg | ORAL_TABLET | Freq: Two times a day (BID) | ORAL | 0 refills | Status: DC
Start: 2019-06-12 — End: 2019-07-16

## 2019-06-12 MED ORDER — VITAMIN E 180 MG (400 UNIT) PO CAPS
400.0000 [IU] | ORAL_CAPSULE | Freq: Every day | ORAL | 3 refills | Status: DC
Start: 2019-06-12 — End: 2019-07-16

## 2019-06-12 NOTE — Progress Notes (Signed)
[facility] at [place], [place] [address] [phone]   Interval Evaluation  Date of Service: 06/12/19 Patient Name: [name] [name] Patient MRN: [id number] Patient DOB: 1945/07/14 Provider: Ventura Sellers, MD  Identifying Statement:  [name] is a 74 y.o. 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 Saintclair Halsted.  Path is WHO grade I meningioma 01/19/19: fx SRS with Dr. Isidore Moos 25/5  Interval History:  [name] [name] presents for follow up after recent MRI brain.  She does describe new symptoms today; she has been having difficulty with verbal expression, feeling "tongue tied" for the past several weeks.  Also, she feels somewhat unsteady walking, dragging her right leg somewhat.  The right hand and harm have become a little clumsy, her handwriting is no longer neat as before.  All of these changes have been progressive in nature over days/weeks.  Otherwise she has no visual changes, no seizures or headaches.   H+P (10/19/17) Patient presented to medical attention initially in August after complaints of loss of short term memory, depressed affect, and new onset headaches.  MRI demonstrated 2 lesions c/w meningioma, one in the left frontal lobe and another tumor in the left cavernous sinus and orbital apex.  She denied visual symptoms including double vision.  Dr. Saintclair Halsted resected the left frontal tumor on 09/07/17, and following surgery she described clinical improvement.  Her memory is still "not 100%" but she feels more like her "normal self".  Patient and husband deny any behavioral sytmpoms or seizures.  Headaches have resolved.  Unfortunately 2-3 weeks after surgery she developed shortness of breath and was found to have a pulmonary embolism.   She was started on Eliquis and has been taking PRN oxygen, although respiratory symptoms have improved dramatically.    Medications: Current Outpatient Medications on File Prior to Visit  Medication Sig Dispense Refill  . dicyclomine (BENTYL) 10 MG capsule Take one by moth 1/2 hour before lunch and at bedtime. 60 capsule 3  . ezetimibe (ZETIA) 10 MG tablet TAKE 1 TABLET(10 MG) BY MOUTH DAILY 90 tablet 1  . AMBULATORY NON FORMULARY MEDICATION 1 tablet daily. Fermented Beets 500mg     . Calcium 500-125 MG-UNIT TABS Take 1 tablet by mouth daily. Chewable    . Multiple Vitamins-Minerals (HAIR SKIN NAILS PO) Take 1 tablet by mouth daily. Chewables    . Prenatal MV & Min w/FA-DHA (PRENATAL ADULT GUMMY/DHA/FA PO) Take 1 tablet by mouth daily.      No current facility-administered medications on file prior to visit.    Allergies:  Allergies  Allergen Reactions  . Bee Venom Swelling  . Losartan Itching   Past Medical History:  Past Medical History:  Diagnosis Date  . Cancer ([place])    skin cancer   . History of radiation therapy 01/09/19- 01/19/19   SRS brain radiation. 5 fractions to total 25 Gy  . Hyperlipidemia   . IBS (irritable bowel syndrome)   . Osteoporosis   . PONV (postoperative nausea and vomiting)    with breast surgery   Past Surgical History:  Past Surgical History:  Procedure Laterality Date  . APPLICATION OF CRANIAL NAVIGATION N/A 09/07/2017   Procedure: APPLICATION OF CRANIAL NAVIGATION;  Surgeon: Kary Kos, MD;  Location: [facility];  Service: Neurosurgery;  Laterality: N/A;  .  BRAIN SURGERY  09/2017  . BUNIONECTOMY    . COLONOSCOPY     [facility] Dr Amedeo Plenty probably about 10 years ago  . CRANIOTOMY N/A 09/07/2017   Procedure: CRAINIOTOMY BI CORONAL w/BRAINLAB;  Surgeon: Kary Kos, MD;  Location: [facility];  Service: Neurosurgery;  Laterality: N/A;  . EYE SURGERY Bilateral    cataract surgery  . SKIN CANCER EXCISION    . WISDOM TOOTH EXTRACTION     Social History:  Social  History   Socioeconomic History  . Marital status: Single    Spouse name: Not on file  . Number of children: Not on file  . Years of education: Not on file  . Highest education level: Not on file  Occupational History  . Not on file  Tobacco Use  . Smoking status: Never Smoker  . Smokeless tobacco: Never Used  Substance and Sexual Activity  . Alcohol use: No  . Drug use: No  . Sexual activity: Not on file  Other Topics Concern  . Not on file  Social History Narrative   Lives home with home.  Not working.  Education:  HS grad.  [name].     Social Determinants of Health   Financial Resource Strain:   . Difficulty of Paying Living Expenses:   Food Insecurity:   . Worried About Charity fundraiser in the Last Year:   . Arboriculturist in the Last Year:   Transportation Needs:   . Film/video editor (Medical):   Marland Kitchen Lack of Transportation (Non-Medical):   Physical Activity:   . Days of Exercise per Week:   . Minutes of Exercise per Session:   Stress:   . Feeling of Stress :   Social Connections:   . Frequency of Communication with Friends and Family:   . Frequency of Social Gatherings with Friends and Family:   . Attends Religious Services:   . Active Member of Clubs or Organizations:   . Attends Archivist Meetings:   Marland Kitchen Marital Status:   Intimate Partner Violence:   . Fear of Current or Ex-Partner:   . Emotionally Abused:   Marland Kitchen Physically Abused:   . Sexually Abused:    Family History:  Family History  Problem Relation Age of Onset  . Breast cancer Mother   . Heart disease Father   . Colon cancer Maternal Aunt     Review of Systems: Constitutional: Denies fevers, chills or abnormal weight loss Eyes: Denies blurriness of vision Ears, nose, mouth, throat, and face: Denies mucositis or sore throat Respiratory: no dyspnea Cardiovascular: Denies palpitation, chest discomfort or lower extremity swelling Gastrointestinal:  Denies nausea,  constipation, diarrhea GU: Denies dysuria or incontinence Skin: Denies abnormal skin rashes Neurological: Per HPI Musculoskeletal: Denies joint pain, back or neck discomfort. No decrease in ROM Behavioral/Psych: Denies anxiety, disturbance in thought content, and mood instability  Physical Exam: Vitals:   06/12/19 0953  BP: (!) 143/93  Pulse: 81  Resp: 18  Temp: 97.9 F (36.6 C)  SpO2: 98%   KPS: 90. General: Alert, cooperative, pleasant, in no acute distress Head: Normal EENT: No conjunctival injection or scleral icterus. Oral mucosa moist Lungs: Resp effort normal Cardiac: Regular rate and rhythm Abdomen: Non-distended abdomen Skin: No rashes cyanosis or petechiae. Extremities: No clubbing or edema  Neurologic Exam: Mental Status: Awake, alert, attentive to examiner. Oriented to self and environment. Modest expressive dysphasia. Cranial Nerves: Visual acuity is grossly normal. Visual fields are full. Extra-ocular  movements intact. No ptosis. Face is symmetric, tongue midline. Motor: Tone and bulk are normal. Power is 4+/5 in right arm and leg. Reflexes are symmetric, no pathologic reflexes present. Intact finger to nose bilaterally Sensory: Intact to light touch and temperature Gait: Hemiparetic   Labs: I have reviewed the data as listed    Component Value Date/Time   NA 141 03/15/2019 1338   NA 141 07/15/2015 0000   NA 141 07/15/2015 0000   K 4.5 03/15/2019 1338   CL 104 03/15/2019 1338   CO2 29 03/15/2019 1338   GLUCOSE 74 03/15/2019 1338   BUN 15 03/15/2019 1338   BUN 22 (A) 07/15/2015 0000   BUN 22 (A) 07/15/2015 0000   CREATININE 0.82 03/15/2019 1338   CREATININE 0.84 12/26/2018 1217   CALCIUM 9.8 03/15/2019 1338   PROT 6.8 03/15/2019 1338   ALBUMIN 4.0 03/15/2019 1338   AST 18 03/15/2019 1338   ALT 13 03/15/2019 1338   ALKPHOS 75 03/15/2019 1338   BILITOT 0.4 03/15/2019 1338   GFRNONAA >60 12/26/2018 1217   GFRAA >60 12/26/2018 1217   Lab Results    Component Value Date   WBC 8.6 03/15/2019   NEUTROABS 7.5 (H) 09/28/2017   HGB 13.0 03/15/2019   HCT 38.7 03/15/2019   MCV 92.9 03/15/2019   PLT 265.0 03/15/2019   Imaging:  [place] Clinician Interpretation: I have personally reviewed the CNS images as listed.  My interpretation, in the context of the patient's clinical presentation, is treatment effect vs true progression  MR BRAIN W WO CONTRAST  Result Date: 06/11/2019 CLINICAL DATA:  Meningioma, surgery 2019, radiation frontal lobe January 2021, follow-up EXAM: MRI HEAD WITHOUT AND WITH CONTRAST TECHNIQUE: Multiplanar, multiecho pulse sequences of the brain and surrounding structures were obtained without and with intravenous contrast. CONTRAST:  64mL MULTIHANCE GADOBENATE DIMEGLUMINE 529 MG/ML IV SOLN COMPARISON:  04/19/2019 FINDINGS: Brain: There are postoperative changes of bifrontal craniotomy. Irregularly enhancing underlying left frontal dural-based lesion has increased in size now measuring 3.1 x 3.1 x 2.7 cm (previously 2.5 x 2.1 x 2.4 cm). There is increased edema in the left greater than right frontal lobes with greater effacement of the lateral ventricles anteriorly and minor rightward midline shift. A second meningioma involving the sella, left cavernous sinus, left Meckel's cave, and left orbital apex is unchanged in size and appearance. Pituitary infundibulum is deviated to the right. Tumor is again noted to surround the left cavernous and most of the supraclinoid ICA. No significant parenchymal edema. There is no acute infarction or intracranial hemorrhage. Stable focus of susceptibility left aspect of the corpus callosum splenium compatible with chronic blood products or less likely mineralization. Occult cavernous malformation is an additional consideration. Vascular: Anterior superior sagittal sinus remains occluded. Major vessel flow voids at the skull base are preserved. Skull and upper cervical spine: Normal marrow signal is  preserved. Sinuses/Orbits: As before, there is likely some tumor extension into the left sphenoid sinus. Bilateral lens replacements. Other: Mastoid air cells are clear. IMPRESSION: Further increase in size left frontal enhancing lesion, associated edema, and mild regional mass effect. At least a component of this may reflect treatment change rather than progression of meningioma and close follow-up is suggested. Stable cavernous sinus meningioma with regional extension as described. Electronically Signed   By: Macy Mis M.D.   On: 06/11/2019 08:30     Assessment/Plan 1. Meningioma [facility])  Ms. Zamudio presents with focal clinical progression today, related to progressive changes within the left frontal  lobe surrounding treated meningioma.  These changes were reviewed extensively in brain/spine tumor board meeting this week, and appear consistent with radio-inflammatory etiology.  This is supported by "soap-bubble" appearance within enhancing volume, and rapid growth rate which is less consistent with meningioma growth pattern.  Cavernous sinus mass is fortunately again stable.   We recommended initiating therapy as follows: -Start dexamethasone 4mg  BID  -Start Trental 400mg  BID, and Vitamin E 400u daily  We will call her for a short term clinical update in 3 days.  Will recommend follow up MRI in 4-6 weeks to further characterize progression and response to steroids.  We appreciate the opportunity to participate in the care of Didi Ganaway.    All questions were answered. The patient knows to call the clinic with any problems, questions or concerns. No barriers to learning were detected.  The total time spent in the encounter was 40 minutes and more than 50% was on counseling and review of test results   Ventura Sellers, MD Medical Director of Neuro-Oncology [facility] at [place] 06/12/19 10:30 AM

## 2019-06-13 ENCOUNTER — Telehealth: Payer: Self-pay | Admitting: Internal Medicine

## 2019-06-13 NOTE — Telephone Encounter (Signed)
Scheduled appt per 6/8 los.  Spoke with pt and she is aware of the appt date and time

## 2019-06-15 ENCOUNTER — Inpatient Hospital Stay (HOSPITAL_BASED_OUTPATIENT_CLINIC_OR_DEPARTMENT_OTHER): Payer: Medicare Other | Admitting: Internal Medicine

## 2019-06-15 ENCOUNTER — Telehealth: Payer: Self-pay

## 2019-06-15 DIAGNOSIS — D329 Benign neoplasm of meninges, unspecified: Secondary | ICD-10-CM | POA: Diagnosis not present

## 2019-06-15 NOTE — Progress Notes (Signed)
I connected with [name] [name] on 06/15/19 at 11:00 AM EDT by telephone visit and verified that I am speaking with the correct person using two identifiers.  I discussed the limitations, risks, security and privacy concerns of performing an evaluation and management service by telemedicine and the availability of in-person appointments. I also discussed with the patient that there may be a patient responsible charge related to this service. The patient expressed understanding and agreed to proceed.  Other persons participating in the visit and their role in the encounter:  n/a  Patient's location:  Home  Provider's location:  Office  Chief Complaint:  Dysphasia, right sided weakness  History of Present Ilness: [name] [name] is significantly improved since initiating therapy with decadron (4mg  twice per day) trental and vitamin e.  Her speaking is more fluid and right sided weakness and imbalance has improved as well.   Observations: Language and cognition at baseline, no dysphasia appreciated Assessment and Plan: Meningioma ([place]) Recommend decreasing decadron to 4mg  daily on 6/15, then decreasing to 2mg  daily on 6/29.  Con't vit e and trental as scheduled Follow Up Instructions: RTC as scheduled following next MRI brain or sooner if needed  I discussed the assessment and treatment plan with the patient.  The patient was provided an opportunity to ask questions and all were answered.  The patient agreed with the plan and demonstrated understanding of the instructions.    The patient was advised to call back or seek an in-person evaluation if the symptoms worsen or if the condition fails to improve as anticipated.  I provided 5-10 minutes of non-face-to-face time during this enocunter.  Ventura Sellers, MD   I provided 15 minutes of non face-to-face telephone visit time during this encounter, and > 50% was spent counseling as documented under my assessment & plan.

## 2019-06-15 NOTE — Telephone Encounter (Signed)
Received call from patient regarding phone visit. She was expecting call from MD at 1000, informed her the call would be at 1100 when it is scheduled. She also provided a cell phone number [phone] to use since home phone was a bit difficult to connect to. MD informed.

## 2019-07-14 ENCOUNTER — Other Ambulatory Visit: Payer: Self-pay

## 2019-07-14 ENCOUNTER — Ambulatory Visit
Admission: RE | Admit: 2019-07-14 | Discharge: 2019-07-14 | Disposition: A | Discharge status: Home / Self Care | Payer: Medicare Other | Source: Ambulatory Visit | Attending: Internal Medicine | Admitting: Internal Medicine

## 2019-07-14 DIAGNOSIS — D329 Benign neoplasm of meninges, unspecified: Secondary | ICD-10-CM

## 2019-07-14 DIAGNOSIS — D320 Benign neoplasm of cerebral meninges: Secondary | ICD-10-CM | POA: Diagnosis not present

## 2019-07-14 MED ORDER — GADOBENATE DIMEGLUMINE 529 MG/ML IV SOLN
14.0000 mL | Freq: Once | INTRAVENOUS | Status: AC | PRN
  Administered 2019-07-14: 14 mL via INTRAVENOUS

## 2019-07-16 ENCOUNTER — Inpatient Hospital Stay: Payer: Medicare Other

## 2019-07-16 ENCOUNTER — Other Ambulatory Visit: Payer: Self-pay

## 2019-07-16 ENCOUNTER — Inpatient Hospital Stay: Payer: Medicare Other | Attending: Internal Medicine | Admitting: Internal Medicine

## 2019-07-16 VITALS — BP 160/91 | HR 86 | Temp 97.5°F | Resp 18 | Ht 62.0 in | Wt 157.0 lb

## 2019-07-16 DIAGNOSIS — R531 Weakness: Secondary | ICD-10-CM | POA: Insufficient documentation

## 2019-07-16 DIAGNOSIS — R0989 Other specified symptoms and signs involving the circulatory and respiratory systems: Secondary | ICD-10-CM | POA: Insufficient documentation

## 2019-07-16 DIAGNOSIS — Z79899 Other long term (current) drug therapy: Secondary | ICD-10-CM | POA: Diagnosis not present

## 2019-07-16 DIAGNOSIS — Z803 Family history of malignant neoplasm of breast: Secondary | ICD-10-CM | POA: Diagnosis not present

## 2019-07-16 DIAGNOSIS — D320 Benign neoplasm of cerebral meninges: Secondary | ICD-10-CM | POA: Diagnosis not present

## 2019-07-16 DIAGNOSIS — Z7901 Long term (current) use of anticoagulants: Secondary | ICD-10-CM | POA: Insufficient documentation

## 2019-07-16 DIAGNOSIS — Z7952 Long term (current) use of systemic steroids: Secondary | ICD-10-CM | POA: Diagnosis not present

## 2019-07-16 DIAGNOSIS — Z85828 Personal history of other malignant neoplasm of skin: Secondary | ICD-10-CM | POA: Diagnosis not present

## 2019-07-16 DIAGNOSIS — R519 Headache, unspecified: Secondary | ICD-10-CM | POA: Insufficient documentation

## 2019-07-16 DIAGNOSIS — Z8249 Family history of ischemic heart disease and other diseases of the circulatory system: Secondary | ICD-10-CM | POA: Diagnosis not present

## 2019-07-16 DIAGNOSIS — Z800 Family history of malignant neoplasm of digestive organs: Secondary | ICD-10-CM | POA: Insufficient documentation

## 2019-07-16 MED ORDER — DEXAMETHASONE 1 MG PO TABS
1.0000 mg | ORAL_TABLET | Freq: Every day | ORAL | 0 refills | Status: AC
Start: 2019-07-16 — End: 2019-07-26

## 2019-07-16 NOTE — Progress Notes (Signed)
[facility] at [address] [place], [place] [address] [phone]   Interval Evaluation  Date of Service: 07/16/19 Patient Name: [name] [name] Patient MRN: [id number] Patient DOB: [birth year] Provider: Ventura Sellers, MD  Identifying Statement:  [name] [name] is a 74 y.o. 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 Saintclair Halsted.  Path is WHO grade I meningioma 01/19/19: fx SRS with Dr. Isidore Moos 25/5  Interval History:  [name] [name] presents for follow up after recent MRI brain.  She describes considerable improvement in both right sided weakness and speech difficulty since our last visit and starting the steroids.  Currently down to 2mg  of decadron each morning, no functional impairment to speak of.  Doesn't describe any seizures or headaches.   H+P (10/19/17) Patient presented to medical attention initially in August after complaints of loss of short term memory, depressed affect, and new onset headaches.  MRI demonstrated 2 lesions c/w meningioma, one in the left frontal lobe and another tumor in the left cavernous sinus and orbital apex.  She denied visual symptoms including double vision.  Dr. Saintclair Halsted resected the left frontal tumor on 09/07/17, and following surgery she described clinical improvement.  Her memory is still "not 100%" but she feels more like her "normal self".  Patient and husband deny any behavioral sytmpoms or seizures.  Headaches have resolved.  Unfortunately 2-3 weeks after surgery she developed shortness of breath and was found to have a pulmonary embolism.  She was started on Eliquis and has been taking PRN oxygen, although respiratory symptoms have improved dramatically.    Medications: Current Outpatient Medications on File Prior to  Visit  Medication Sig Dispense Refill  . AMBULATORY NON FORMULARY MEDICATION 1 tablet daily. Fermented Beets 500mg     . Calcium 500-125 MG-UNIT TABS Take 1 tablet by mouth daily. Chewable    . dexamethasone (DECADRON) 4 MG tablet Take 1 tablet (4 mg total) by mouth 2 (two) times daily. 60 tablet 0  . dicyclomine (BENTYL) 10 MG capsule Take one by moth 1/2 hour before lunch and at bedtime. 60 capsule 3  . ezetimibe (ZETIA) 10 MG tablet TAKE 1 TABLET(10 MG) BY MOUTH DAILY 90 tablet 1  . Multiple Vitamins-Minerals (HAIR SKIN NAILS PO) Take 1 tablet by mouth daily. Chewables    . pentoxifylline (TRENTAL) 400 MG CR tablet TAKE 1 TABLET(400 MG) BY MOUTH THREE TIMES DAILY WITH MEALS 90 tablet 3  . Prenatal MV & Min w/FA-DHA (PRENATAL ADULT GUMMY/DHA/FA PO) Take 1 tablet by mouth daily.     . vitamin E (VITAMIN E) 180 MG (400 UNITS) capsule Take 1 capsule (400 Units total) by mouth daily. 30 capsule 3   No current facility-administered medications on file prior to visit.    Allergies:  Allergies  Allergen Reactions  . Bee Venom Swelling  . Losartan Itching   Past Medical History:  Past Medical History:  Diagnosis Date  . Cancer ([facility])    skin cancer   . History of radiation therapy 01/09/19- 01/19/19   SRS brain radiation. 5 fractions to total 25 Gy  . Hyperlipidemia   . IBS (irritable bowel syndrome)   . Osteoporosis   . PONV (postoperative nausea and vomiting)    with breast surgery   Past Surgical History:  Past Surgical History:  Procedure  Laterality Date  . APPLICATION OF CRANIAL NAVIGATION N/A 09/07/2017   Procedure: APPLICATION OF CRANIAL NAVIGATION;  Surgeon: Kary Kos, MD;  Location: [facility];  Service: Neurosurgery;  Laterality: N/A;  . BRAIN SURGERY  09/2017  . BUNIONECTOMY    . COLONOSCOPY     [facility] Dr Amedeo Plenty probably about 10 years ago  . CRANIOTOMY N/A 09/07/2017   Procedure: CRAINIOTOMY BI CORONAL w/BRAINLAB;  Surgeon: Kary Kos, MD;  Location: [facility];  Service:  Neurosurgery;  Laterality: N/A;  . EYE SURGERY Bilateral    cataract surgery  . SKIN CANCER EXCISION    . WISDOM TOOTH EXTRACTION     Social History:  Social History   Socioeconomic History  . Marital status: Single    Spouse name: Not on file  . Number of children: Not on file  . Years of education: Not on file  . Highest education level: Not on file  Occupational History  . Not on file  Tobacco Use  . Smoking status: Never Smoker  . Smokeless tobacco: Never Used  Vaping Use  . Vaping Use: Never used  Substance and Sexual Activity  . Alcohol use: No  . Drug use: No  . Sexual activity: Not on file  Other Topics Concern  . Not on file  Social History Narrative   Lives home with home.  Not working.  Education:  HS grad.  [name].     Social Determinants of Health   Financial Resource Strain:   . Difficulty of Paying Living Expenses:   Food Insecurity:   . Worried About Charity fundraiser in the Last Year:   . Arboriculturist in the Last Year:   Transportation Needs:   . Film/video editor (Medical):   Marland Kitchen Lack of Transportation (Non-Medical):   Physical Activity:   . Days of Exercise per Week:   . Minutes of Exercise per Session:   Stress:   . Feeling of Stress :   Social Connections:   . Frequency of Communication with Friends and Family:   . Frequency of Social Gatherings with Friends and Family:   . Attends Religious Services:   . Active Member of Clubs or Organizations:   . Attends Archivist Meetings:   Marland Kitchen Marital Status:   Intimate Partner Violence:   . Fear of Current or Ex-Partner:   . Emotionally Abused:   Marland Kitchen Physically Abused:   . Sexually Abused:    Family History:  Family History  Problem Relation Age of Onset  . Breast cancer Mother   . Heart disease Father   . Colon cancer Maternal Aunt     Review of Systems: Constitutional: Denies fevers, chills or abnormal weight loss Eyes: Denies blurriness of vision Ears, nose,  mouth, throat, and face: Denies mucositis or sore throat Respiratory: no dyspnea Cardiovascular: Denies palpitation, chest discomfort or lower extremity swelling Gastrointestinal:  Denies nausea, constipation, diarrhea GU: Denies dysuria or incontinence Skin: Denies abnormal skin rashes Neurological: Per HPI Musculoskeletal: Denies joint pain, back or neck discomfort. No decrease in ROM Behavioral/Psych: Denies anxiety, disturbance in thought content, and mood instability  Physical Exam: Vitals:   07/16/19 1233  BP: (!) 160/91  Pulse: 86  Resp: 18  Temp: (!) 97.5 F (36.4 C)  SpO2: 98%   KPS: 90. General: Alert, cooperative, pleasant, in no acute distress Head: Normal EENT: No conjunctival injection or scleral icterus. Oral mucosa moist Lungs: Resp effort normal Cardiac: Regular rate and rhythm  Abdomen: Non-distended abdomen Skin: No rashes cyanosis or petechiae. Extremities: No clubbing or edema  Neurologic Exam: Mental Status: Awake, alert, attentive to examiner. Oriented to self and environment. Language fluency and comprehension normal. Cranial Nerves: Visual acuity is grossly normal. Visual fields are full. Extra-ocular movements intact. No ptosis. Face is symmetric, tongue midline. Motor: Tone and bulk are normal. Power is 5/5 in arms and legs. Reflexes are symmetric, no pathologic reflexes present. Intact finger to nose bilaterally Sensory: Intact to light touch and temperature Gait: Normal  Labs: I have reviewed the data as listed    Component Value Date/Time   NA 141 03/15/2019 1338   NA 141 07/15/2015 0000   NA 141 07/15/2015 0000   K 4.5 03/15/2019 1338   CL 104 03/15/2019 1338   CO2 29 03/15/2019 1338   GLUCOSE 74 03/15/2019 1338   BUN 15 03/15/2019 1338   BUN 22 (A) 07/15/2015 0000   BUN 22 (A) 07/15/2015 0000   CREATININE 0.82 03/15/2019 1338   CREATININE 0.84 12/26/2018 1217   CALCIUM 9.8 03/15/2019 1338   PROT 6.8 03/15/2019 1338   ALBUMIN 4.0  03/15/2019 1338   AST 18 03/15/2019 1338   ALT 13 03/15/2019 1338   ALKPHOS 75 03/15/2019 1338   BILITOT 0.4 03/15/2019 1338   GFRNONAA >60 12/26/2018 1217   GFRAA >60 12/26/2018 1217   Lab Results  Component Value Date   WBC 8.6 03/15/2019   NEUTROABS 7.5 (H) 09/28/2017   HGB 13.0 03/15/2019   HCT 38.7 03/15/2019   MCV 92.9 03/15/2019   PLT 265.0 03/15/2019   Imaging:  [facility] Clinician Interpretation: I have personally reviewed the CNS images as listed.  My interpretation, in the context of the patient's clinical presentation, is stable disease  MR BRAIN W WO CONTRAST  Addendum Date: 07/15/2019   ADDENDUM REPORT: 07/15/2019 22:22 ADDENDUM: Extension into the sella turcica is unchanged. There is rightward deviation of the infundibulum, also unchanged. Electronically Signed   By: Ulyses Jarred M.D.   On: 07/15/2019 22:22   Result Date: 07/15/2019 CLINICAL DATA:  Follow-up meningioma EXAM: MRI HEAD WITHOUT AND WITH CONTRAST TECHNIQUE: Multiplanar, multiecho pulse sequences of the brain and surrounding structures were obtained without and with intravenous contrast. CONTRAST:  55mL MULTIHANCE GADOBENATE DIMEGLUMINE 529 MG/ML IV SOLN COMPARISON:  Brain MRI 06/10/2019 FINDINGS: BRAIN: No acute infarct, acute hemorrhage or extra-axial collection. Contrast-enhancing mass at the anterior left convexity is unchanged in size, measuring 3.2 x 2.5 cm. There is persistent dural thickening and enhancement subjacent to the frontal craniotomy. There is a second mass at the left cavernous sinus and orbital apex that measures 3.4 x 2.7 cm on sagittal images, unchanged. Again, this encases the cavernous left internal carotid artery all the way to the carotid terminus. Hyperintense T2-weighted signal within the left frontal white matter has decreased slightly. The amount hyperintense T2-weighted signal in the right frontal white matter is unchanged. Mild rightward bulging of the falx cerebri is unchanged. There  are blood products within the left anterior mass. VASCULAR: Diminished left internal carotid artery cavernous segment flow void. SKULL AND UPPER CERVICAL SPINE: Remote frontal craniotomy. SINUSES/ORBITS: No paranasal sinus fluid levels or advanced mucosal thickening. No mastoid or middle ear effusion. Normal orbits. IMPRESSION: 1. Unchanged size of left convexity meningioma with unchanged mass effect on the underlying brain. Slightly decreased hyperintense T2-weighted signal within the left frontal white matter. 2. Unchanged appearance of left cavernous sinus/cavernous sinus meningioma with encasement of the left ICA. 3. Unchanged  dural thickening subjacent to frontal craniotomy. Electronically Signed: By: Ulyses Jarred M.D. On: 07/15/2019 21:36     Assessment/Plan 1. Meningioma [facility])  [name] [name] is clinically and radiographically stable today.  She describes complete return to baseline after corticosteroid therapy.   We recommended initiating therapy as follows: -Decrease dexamethasone to 1mg  daily x10 days, then STOP -Discontinue Trental and Vitamin   We ask that [name] [name] return to clinic in 3 months following next brain MRI, or sooner as needed.  We appreciate the opportunity to participate in the care of [name] [name].    All questions were answered. The patient knows to call the clinic with any problems, questions or concerns. No barriers to learning were detected.  I have spent a total of 40 minutes of face-to-face and non-face-to-face time, excluding clinical staff time, preparing to see patient, ordering tests and/or medications, counseling the patient, and independently interpreting results and communicating results to the patient/family/caregiver    Ventura Sellers, MD Medical Director of Neuro-Oncology [facility] 07/16/19 12:37 PM

## 2019-07-17 ENCOUNTER — Telehealth: Payer: Self-pay | Admitting: Internal Medicine

## 2019-07-17 DIAGNOSIS — D225 Melanocytic nevi of trunk: Secondary | ICD-10-CM | POA: Diagnosis not present

## 2019-07-17 DIAGNOSIS — D1801 Hemangioma of skin and subcutaneous tissue: Secondary | ICD-10-CM | POA: Diagnosis not present

## 2019-07-17 DIAGNOSIS — Z85828 Personal history of other malignant neoplasm of skin: Secondary | ICD-10-CM | POA: Diagnosis not present

## 2019-07-17 DIAGNOSIS — L570 Actinic keratosis: Secondary | ICD-10-CM | POA: Diagnosis not present

## 2019-07-17 NOTE — Telephone Encounter (Signed)
Scheduled per 7/12 los. Unable to reach pt. Left voicemail with appt time and date.

## 2019-07-18 ENCOUNTER — Other Ambulatory Visit: Payer: Self-pay | Admitting: Radiation Therapy

## 2019-07-23 ENCOUNTER — Ambulatory Visit: Payer: Medicare Other | Admitting: Internal Medicine

## 2019-08-01 ENCOUNTER — Other Ambulatory Visit: Payer: Self-pay | Admitting: Family Medicine

## 2019-08-01 DIAGNOSIS — E785 Hyperlipidemia, unspecified: Secondary | ICD-10-CM

## 2019-08-09 ENCOUNTER — Telehealth: Payer: Self-pay | Admitting: *Deleted

## 2019-08-09 NOTE — Telephone Encounter (Signed)
Patient called to report she was able to come off of all medications that Dr. Mickeal Skinner ordered including Decadron about 2 weeks ago.  Reports worsening symptoms, almost like it was before when she was initially placed on the steroid.  She reports falling, and difficulty finding words to express thoughts, and lack of energy.    Questioned if she should come in for visit or if needs new medication.  Routed to Dr. Mickeal Skinner.

## 2019-08-10 ENCOUNTER — Other Ambulatory Visit: Payer: Self-pay

## 2019-08-10 ENCOUNTER — Inpatient Hospital Stay: Payer: Medicare Other | Attending: Internal Medicine | Admitting: Internal Medicine

## 2019-08-10 VITALS — BP 166/80 | HR 68 | Temp 97.9°F | Resp 18 | Ht 62.0 in | Wt 156.5 lb

## 2019-08-10 DIAGNOSIS — D329 Benign neoplasm of meninges, unspecified: Secondary | ICD-10-CM | POA: Diagnosis not present

## 2019-08-10 DIAGNOSIS — Z86711 Personal history of pulmonary embolism: Secondary | ICD-10-CM | POA: Diagnosis not present

## 2019-08-10 DIAGNOSIS — Z800 Family history of malignant neoplasm of digestive organs: Secondary | ICD-10-CM | POA: Diagnosis not present

## 2019-08-10 DIAGNOSIS — Z85828 Personal history of other malignant neoplasm of skin: Secondary | ICD-10-CM | POA: Insufficient documentation

## 2019-08-10 DIAGNOSIS — Z803 Family history of malignant neoplasm of breast: Secondary | ICD-10-CM | POA: Diagnosis not present

## 2019-08-10 DIAGNOSIS — Z79899 Other long term (current) drug therapy: Secondary | ICD-10-CM | POA: Insufficient documentation

## 2019-08-10 DIAGNOSIS — Z923 Personal history of irradiation: Secondary | ICD-10-CM | POA: Insufficient documentation

## 2019-08-10 DIAGNOSIS — Z7901 Long term (current) use of anticoagulants: Secondary | ICD-10-CM | POA: Insufficient documentation

## 2019-08-10 DIAGNOSIS — Z8249 Family history of ischemic heart disease and other diseases of the circulatory system: Secondary | ICD-10-CM | POA: Insufficient documentation

## 2019-08-10 DIAGNOSIS — R531 Weakness: Secondary | ICD-10-CM | POA: Diagnosis not present

## 2019-08-10 DIAGNOSIS — D320 Benign neoplasm of cerebral meninges: Secondary | ICD-10-CM | POA: Diagnosis not present

## 2019-08-10 MED ORDER — DEXAMETHASONE 1 MG PO TABS
1.0000 mg | ORAL_TABLET | Freq: Every day | ORAL | 0 refills | Status: DC
Start: 2019-08-10 — End: 2019-09-14

## 2019-08-10 NOTE — Progress Notes (Signed)
[facility] at [address], [place] [address] [phone]   Interval Evaluation  Date of Service: 08/10/19 Patient Name: [name] [name] Patient MRN: [id number] Patient DOB: December 07, 1945 Provider: Ventura Sellers, MD  Identifying Statement:  Doha Boling is a 74 y.o. 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 Saintclair Halsted.  Path is WHO grade I meningioma 01/19/19: fx SRS with Dr. Isidore Moos 25/5  Interval History:  [name] [name] presents today with new clinical symptoms.  She describes modest resumption of right sided weakness and clumsiness over the past week.  Completed the steroid taper one week ago.  Also feels worsening of overall balance, with two falls.  Not requiring assistance for ambulation.  Doesn't describe any seizures or headaches.   H+P (10/19/17) Patient presented to medical attention initially in August after complaints of loss of short term memory, depressed affect, and new onset headaches.  MRI demonstrated 2 lesions c/w meningioma, one in the left frontal lobe and another tumor in the left cavernous sinus and orbital apex.  She denied visual symptoms including double vision.  Dr. Saintclair Halsted resected the left frontal tumor on 09/07/17, and following surgery she described clinical improvement.  Her memory is still "not 100%" but she feels more like her "normal self".  Patient and husband deny any behavioral sytmpoms or seizures.  Headaches have resolved.  Unfortunately 2-3 weeks after surgery she developed shortness of breath and was found to have a pulmonary embolism.  She was started on Eliquis and has been taking PRN oxygen, although respiratory symptoms have improved dramatically.    Medications: Current Outpatient Medications on File Prior  to Visit  Medication Sig Dispense Refill  . AMBULATORY NON FORMULARY MEDICATION 1 tablet daily. Fermented Beets 500mg     . Calcium 500-125 MG-UNIT TABS Take 1 tablet by mouth daily. Chewable    . dicyclomine (BENTYL) 10 MG capsule Take one by moth 1/2 hour before lunch and at bedtime. 60 capsule 3  . ezetimibe (ZETIA) 10 MG tablet Take 1 tablet (10 mg total) by mouth daily. 90 tablet 1  . Multiple Vitamins-Minerals (HAIR SKIN NAILS PO) Take 1 tablet by mouth daily. Chewables    . Prenatal MV & Min w/FA-DHA (PRENATAL ADULT GUMMY/DHA/FA PO) Take 1 tablet by mouth daily.      No current facility-administered medications on file prior to visit.    Allergies:  Allergies  Allergen Reactions  . Bee Venom Swelling  . Losartan Itching   Past Medical History:  Past Medical History:  Diagnosis Date  . Cancer ([facility])    skin cancer   . History of radiation therapy 01/09/19- 01/19/19   SRS brain radiation. 5 fractions to total 25 Gy  . Hyperlipidemia   . IBS (irritable bowel syndrome)   . Osteoporosis   . PONV (postoperative nausea and vomiting)    with breast surgery   Past Surgical History:  Past Surgical History:  Procedure Laterality Date  . APPLICATION OF CRANIAL NAVIGATION N/A 09/07/2017   Procedure: APPLICATION OF CRANIAL NAVIGATION;  Surgeon: Kary Kos, MD;  Location: [facility];  Service: Neurosurgery;  Laterality: N/A;  . BRAIN SURGERY  09/2017  . BUNIONECTOMY    . COLONOSCOPY     [facility] Dr Amedeo Plenty probably about 10 years ago  . CRANIOTOMY N/A 09/07/2017  Procedure: CRAINIOTOMY BI CORONAL w/BRAINLAB;  Surgeon: Kary Kos, MD;  Location: [facility];  Service: Neurosurgery;  Laterality: N/A;  . EYE SURGERY Bilateral    cataract surgery  . SKIN CANCER EXCISION    . WISDOM TOOTH EXTRACTION     Social History:  Social History   Socioeconomic History  . Marital status: Single    Spouse name: Not on file  . Number of children: Not on file  . Years of education: Not on file  . Highest  education level: Not on file  Occupational History  . Not on file  Tobacco Use  . Smoking status: Never Smoker  . Smokeless tobacco: Never Used  Vaping Use  . Vaping Use: Never used  Substance and Sexual Activity  . Alcohol use: No  . Drug use: No  . Sexual activity: Not on file  Other Topics Concern  . Not on file  Social History Narrative   Lives home with home.  Not working.  Education:  HS grad.  [name].     Social Determinants of Health   Financial Resource Strain:   . Difficulty of Paying Living Expenses:   Food Insecurity:   . Worried About Charity fundraiser in the Last Year:   . Arboriculturist in the Last Year:   Transportation Needs:   . Film/video editor (Medical):   Marland Kitchen Lack of Transportation (Non-Medical):   Physical Activity:   . Days of Exercise per Week:   . Minutes of Exercise per Session:   Stress:   . Feeling of Stress :   Social Connections:   . Frequency of Communication with Friends and Family:   . Frequency of Social Gatherings with Friends and Family:   . Attends Religious Services:   . Active Member of Clubs or Organizations:   . Attends Archivist Meetings:   Marland Kitchen Marital Status:   Intimate Partner Violence:   . Fear of Current or Ex-Partner:   . Emotionally Abused:   Marland Kitchen Physically Abused:   . Sexually Abused:    Family History:  Family History  Problem Relation Age of Onset  . Breast cancer Mother   . Heart disease Father   . Colon cancer Maternal Aunt     Review of Systems: Constitutional: Denies fevers, chills or abnormal weight loss Eyes: Denies blurriness of vision Ears, nose, mouth, throat, and face: Denies mucositis or sore throat Respiratory: no dyspnea Cardiovascular: Denies palpitation, chest discomfort or lower extremity swelling Gastrointestinal:  Denies nausea, constipation, diarrhea GU: Denies dysuria or incontinence Skin: Denies abnormal skin rashes Neurological: Per HPI Musculoskeletal:  Denies joint pain, back or neck discomfort. No decrease in ROM Behavioral/Psych: Denies anxiety, disturbance in thought content, and mood instability  Physical Exam: Vitals:   08/10/19 1126  BP: (!) 166/80  Pulse: 68  Resp: 18  Temp: 97.9 F (36.6 C)  SpO2: 98%   KPS: 90. General: Alert, cooperative, pleasant, in no acute distress Head: Normal EENT: No conjunctival injection or scleral icterus. Oral mucosa moist Lungs: Resp effort normal Cardiac: Regular rate and rhythm Abdomen: Non-distended abdomen Skin: No rashes cyanosis or petechiae. Extremities: No clubbing or edema  Neurologic Exam: Mental Status: Awake, alert, attentive to examiner. Oriented to self and environment. Language fluency and comprehension normal. Cranial Nerves: Visual acuity is grossly normal. Visual fields are full. Extra-ocular movements intact. No ptosis. Face is symmetric, tongue midline. Motor: Tone and bulk are normal. Power is 5/5 in arms and  legs except 4+/5 in right arm. Reflexes are symmetric, no pathologic reflexes present. Intact finger to nose bilaterally Sensory: Intact to light touch and temperature Gait: Normal  Labs: I have reviewed the data as listed    Component Value Date/Time   NA 141 03/15/2019 1338   NA 141 07/15/2015 0000   NA 141 07/15/2015 0000   K 4.5 03/15/2019 1338   CL 104 03/15/2019 1338   CO2 29 03/15/2019 1338   GLUCOSE 74 03/15/2019 1338   BUN 15 03/15/2019 1338   BUN 22 (A) 07/15/2015 0000   BUN 22 (A) 07/15/2015 0000   CREATININE 0.82 03/15/2019 1338   CREATININE 0.84 12/26/2018 1217   CALCIUM 9.8 03/15/2019 1338   PROT 6.8 03/15/2019 1338   ALBUMIN 4.0 03/15/2019 1338   AST 18 03/15/2019 1338   ALT 13 03/15/2019 1338   ALKPHOS 75 03/15/2019 1338   BILITOT 0.4 03/15/2019 1338   GFRNONAA >60 12/26/2018 1217   GFRAA >60 12/26/2018 1217   Lab Results  Component Value Date   WBC 8.6 03/15/2019   NEUTROABS 7.5 (H) 09/28/2017   HGB 13.0 03/15/2019   HCT  38.7 03/15/2019   MCV 92.9 03/15/2019   PLT 265.0 03/15/2019    Assessment/Plan 1. Meningioma ([facility])  Ayahna Solazzo demonstrates clinical changes today which we suspect are secondary to recurrence of focal inflammation.  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  She previously had very good response to corticosteroids.    We recommended the following: -Resume decadron 8mg  daily x3 days, then 4mg  daily x3 days, then 2mg  daily x10 days, then 1mg  daily x10 days, then stop. -Hold off on Trental and Vitamin E unless symptoms do not improve   She will then return to clinic as scheduled in 2 months, following next MRI brain.  If symptoms do not respond to steroids, we will need to perform imaging sooner.  We appreciate the opportunity to participate in the care of Unnamed Hino.    All questions were answered. The patient knows to call the clinic with any problems, questions or concerns. No barriers to learning were detected.  I have spent a total of 30 minutes of face-to-face and non-face-to-face time, excluding clinical staff time, preparing to see patient, ordering tests and/or medications, counseling the patient, and independently interpreting results and communicating results to the patient/family/caregiver    Ventura Sellers, MD Medical Director of Neuro-Oncology [facility] at [place] 08/10/19 10:06 AM

## 2019-08-28 ENCOUNTER — Other Ambulatory Visit: Payer: Self-pay | Admitting: Radiation Therapy

## 2019-09-13 ENCOUNTER — Telehealth: Payer: Self-pay | Admitting: *Deleted

## 2019-09-13 ENCOUNTER — Other Ambulatory Visit: Payer: Self-pay | Admitting: Internal Medicine

## 2019-09-13 NOTE — Telephone Encounter (Signed)
Patient called to request a visit before her already scheduled appointment.  She was last prescribed Decadron because of weakness, difficulty ambulation and confusion.  Patient's significant other got on the line to give details.  Patient has been off of Decadron 1 mg daily for approximately 1 week.  Patient was unable to get self dressed and having trouble moving about.  Mild confusion, memory issues.  Patient took Decadron 4 mg this morning to see if it would help any.    Discussed with Dr Mickeal Skinner and requested that visit be scheduled for tomorrow to assess patient.

## 2019-09-14 ENCOUNTER — Inpatient Hospital Stay: Payer: Medicare Other | Attending: Internal Medicine | Admitting: Internal Medicine

## 2019-09-14 ENCOUNTER — Other Ambulatory Visit: Payer: Self-pay

## 2019-09-14 VITALS — BP 152/82 | HR 81 | Temp 97.7°F | Resp 18 | Ht 62.0 in | Wt 162.3 lb

## 2019-09-14 DIAGNOSIS — Z85828 Personal history of other malignant neoplasm of skin: Secondary | ICD-10-CM | POA: Diagnosis not present

## 2019-09-14 DIAGNOSIS — D329 Benign neoplasm of meninges, unspecified: Secondary | ICD-10-CM | POA: Diagnosis not present

## 2019-09-14 DIAGNOSIS — Z7901 Long term (current) use of anticoagulants: Secondary | ICD-10-CM | POA: Diagnosis not present

## 2019-09-14 DIAGNOSIS — R0989 Other specified symptoms and signs involving the circulatory and respiratory systems: Secondary | ICD-10-CM | POA: Diagnosis not present

## 2019-09-14 DIAGNOSIS — E785 Hyperlipidemia, unspecified: Secondary | ICD-10-CM | POA: Diagnosis not present

## 2019-09-14 DIAGNOSIS — Z8249 Family history of ischemic heart disease and other diseases of the circulatory system: Secondary | ICD-10-CM | POA: Insufficient documentation

## 2019-09-14 DIAGNOSIS — D320 Benign neoplasm of cerebral meninges: Secondary | ICD-10-CM | POA: Insufficient documentation

## 2019-09-14 DIAGNOSIS — Z7952 Long term (current) use of systemic steroids: Secondary | ICD-10-CM | POA: Diagnosis not present

## 2019-09-14 DIAGNOSIS — R531 Weakness: Secondary | ICD-10-CM | POA: Diagnosis not present

## 2019-09-14 DIAGNOSIS — R519 Headache, unspecified: Secondary | ICD-10-CM | POA: Diagnosis not present

## 2019-09-14 DIAGNOSIS — Z79899 Other long term (current) drug therapy: Secondary | ICD-10-CM | POA: Insufficient documentation

## 2019-09-14 DIAGNOSIS — Z800 Family history of malignant neoplasm of digestive organs: Secondary | ICD-10-CM | POA: Diagnosis not present

## 2019-09-14 DIAGNOSIS — F329 Major depressive disorder, single episode, unspecified: Secondary | ICD-10-CM | POA: Insufficient documentation

## 2019-09-14 DIAGNOSIS — Z803 Family history of malignant neoplasm of breast: Secondary | ICD-10-CM | POA: Insufficient documentation

## 2019-09-14 MED ORDER — DEXAMETHASONE 1 MG PO TABS
2.0000 mg | ORAL_TABLET | Freq: Every day | ORAL | 3 refills | Status: DC
Start: 2019-09-14 — End: 2019-10-25

## 2019-09-14 NOTE — Progress Notes (Signed)
[facility] [place], [place] [address] [phone]   Interval Evaluation  Date of Service: 09/14/19 Patient Name: [name] [name] Patient MRN: [id number] Patient DOB: 1945-12-31 Provider: Ventura Sellers, MD  Identifying Statement:  Zameria Vogl is a 74 y.o. 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 Saintclair Halsted.  Path is WHO grade I meningioma 01/19/19: fx SRS with Dr. Isidore Moos 25/5  Interval History:  [name] [name] presents today with recurrence of clinical symptoms.  She describes recurrence of right sided weakness and clumsiness over the past two weeks, since decadron taper was completed. Handwriting is somewhat strained.  She had one fall earlier this week while in the shower, due to "leg giving out".  Still not requiring regular assistance for ambulation.  Doesn't describe any seizures or headaches.   H+P (10/19/17) Patient presented to medical attention initially in August after complaints of loss of short term memory, depressed affect, and new onset headaches.  MRI demonstrated 2 lesions c/w meningioma, one in the left frontal lobe and another tumor in the left cavernous sinus and orbital apex.  She denied visual symptoms including double vision.  Dr. Saintclair Halsted resected the left frontal tumor on 09/07/17, and following surgery she described clinical improvement.  Her memory is still "not 100%" but she feels more like her "normal self".  Patient and husband deny any behavioral sytmpoms or seizures.  Headaches have resolved.  Unfortunately 2-3 weeks after surgery she developed shortness of breath and was found to have a pulmonary embolism.  She was started on Eliquis and has been taking PRN oxygen, although respiratory symptoms have improved  dramatically.    Medications: Current Outpatient Medications on File Prior to Visit  Medication Sig Dispense Refill  . Calcium 500-125 MG-UNIT TABS Take 1 tablet by mouth daily. Chewable    . ezetimibe (ZETIA) 10 MG tablet Take 1 tablet (10 mg total) by mouth daily. 90 tablet 1  . dexamethasone (DECADRON) 1 MG tablet Take 1 tablet (1 mg total) by mouth daily with breakfast. (Patient not taking: Reported on 09/14/2019) 30 tablet 0  . dicyclomine (BENTYL) 10 MG capsule Take one by moth 1/2 hour before lunch and at bedtime. (Patient not taking: Reported on 09/14/2019) 60 capsule 3  . Multiple Vitamins-Minerals (HAIR SKIN NAILS PO) Take 1 tablet by mouth daily. Chewables    . Prenatal MV & Min w/FA-DHA (PRENATAL ADULT GUMMY/DHA/FA PO) Take 1 tablet by mouth daily.      No current facility-administered medications on file prior to visit.    Allergies:  Allergies  Allergen Reactions  . Bee Venom Swelling  . Losartan Itching   Past Medical History:  Past Medical History:  Diagnosis Date  . Cancer ([facility])    skin cancer   . History of radiation therapy 01/09/19- 01/19/19   SRS brain radiation. 5 fractions to total 25 Gy  . Hyperlipidemia   . IBS (irritable bowel syndrome)   . Osteoporosis   . PONV (postoperative nausea and vomiting)    with breast surgery   Past Surgical History:  Past Surgical History:  Procedure Laterality Date  . APPLICATION OF CRANIAL NAVIGATION N/A 09/07/2017   Procedure: APPLICATION OF CRANIAL NAVIGATION;  Surgeon: Kary Kos, MD;  Location: [facility];  Service: Neurosurgery;  Laterality: N/A;  .  BRAIN SURGERY  09/2017  . BUNIONECTOMY    . COLONOSCOPY     [facility] Dr Amedeo Plenty probably about 10 years ago  . CRANIOTOMY N/A 09/07/2017   Procedure: CRAINIOTOMY BI CORONAL w/BRAINLAB;  Surgeon: Kary Kos, MD;  Location: [facility];  Service: Neurosurgery;  Laterality: N/A;  . EYE SURGERY Bilateral    cataract surgery  . SKIN CANCER EXCISION    . WISDOM TOOTH EXTRACTION     Social  History:  Social History   Socioeconomic History  . Marital status: Single    Spouse name: Not on file  . Number of children: Not on file  . Years of education: Not on file  . Highest education level: Not on file  Occupational History  . Not on file  Tobacco Use  . Smoking status: Never Smoker  . Smokeless tobacco: Never Used  Vaping Use  . Vaping Use: Never used  Substance and Sexual Activity  . Alcohol use: No  . Drug use: No  . Sexual activity: Not on file  Other Topics Concern  . Not on file  Social History Narrative   Lives home with home.  Not working.  Education:  HS grad.  [name].     Social Determinants of Health   Financial Resource Strain:   . Difficulty of Paying Living Expenses: Not on file  Food Insecurity:   . Worried About Charity fundraiser in the Last Year: Not on file  . Ran Out of Food in the Last Year: Not on file  Transportation Needs:   . Lack of Transportation (Medical): Not on file  . Lack of Transportation (Non-Medical): Not on file  Physical Activity:   . Days of Exercise per Week: Not on file  . Minutes of Exercise per Session: Not on file  Stress:   . Feeling of Stress : Not on file  Social Connections:   . Frequency of Communication with Friends and Family: Not on file  . Frequency of Social Gatherings with Friends and Family: Not on file  . Attends Religious Services: Not on file  . Active Member of Clubs or Organizations: Not on file  . Attends Archivist Meetings: Not on file  . Marital Status: Not on file  Intimate Partner Violence:   . Fear of Current or Ex-Partner: Not on file  . Emotionally Abused: Not on file  . Physically Abused: Not on file  . Sexually Abused: Not on file   Family History:  Family History  Problem Relation Age of Onset  . Breast cancer Mother   . Heart disease Father   . Colon cancer Maternal Aunt     Review of Systems: Constitutional: Denies fevers, chills or abnormal weight  loss Eyes: Denies blurriness of vision Ears, nose, mouth, throat, and face: Denies mucositis or sore throat Respiratory: no dyspnea Cardiovascular: Denies palpitation, chest discomfort or lower extremity swelling Gastrointestinal:  Denies nausea, constipation, diarrhea GU: Denies dysuria or incontinence Skin: Denies abnormal skin rashes Neurological: Per HPI Musculoskeletal: Denies joint pain, back or neck discomfort. No decrease in ROM Behavioral/Psych: Denies anxiety, disturbance in thought content, and mood instability  Physical Exam: Vitals:   09/14/19 1025  BP: (!) 152/82  Pulse: 81  Resp: 18  Temp: 97.7 F (36.5 C)  SpO2: 97%   KPS: 90. General: Alert, cooperative, pleasant, in no acute distress Head: Normal EENT: No conjunctival injection or scleral icterus. Oral mucosa moist Lungs: Resp effort normal Cardiac: Regular rate and  rhythm Abdomen: Non-distended abdomen Skin: No rashes cyanosis or petechiae. Extremities: No clubbing or edema  Neurologic Exam: Mental Status: Awake, alert, attentive to examiner. Oriented to self and environment. Language fluency and comprehension normal. Cranial Nerves: Visual acuity is grossly normal. Visual fields are full. Extra-ocular movements intact. No ptosis. Face is symmetric, tongue midline. Motor: Tone and bulk are normal. Power is 4/5 in right leg, 4+/5 in right arm. Reflexes are symmetric, no pathologic reflexes present. Intact finger to nose bilaterally Sensory: Intact to light touch and temperature Gait: Hemiparetic  Labs: I have reviewed the data as listed    Component Value Date/Time   NA 141 03/15/2019 1338   NA 141 07/15/2015 0000   NA 141 07/15/2015 0000   K 4.5 03/15/2019 1338   CL 104 03/15/2019 1338   CO2 29 03/15/2019 1338   GLUCOSE 74 03/15/2019 1338   BUN 15 03/15/2019 1338   BUN 22 (A) 07/15/2015 0000   BUN 22 (A) 07/15/2015 0000   CREATININE 0.82 03/15/2019 1338   CREATININE 0.84 12/26/2018 1217    CALCIUM 9.8 03/15/2019 1338   PROT 6.8 03/15/2019 1338   ALBUMIN 4.0 03/15/2019 1338   AST 18 03/15/2019 1338   ALT 13 03/15/2019 1338   ALKPHOS 75 03/15/2019 1338   BILITOT 0.4 03/15/2019 1338   GFRNONAA >60 12/26/2018 1217   GFRAA >60 12/26/2018 1217   Lab Results  Component Value Date   WBC 8.6 03/15/2019   NEUTROABS 7.5 (H) 09/28/2017   HGB 13.0 03/15/2019   HCT 38.7 03/15/2019   MCV 92.9 03/15/2019   PLT 265.0 03/15/2019    Assessment/Plan 1. Meningioma ([facility])  Lovinia Snare presents with recurrent, stereotypic clinical changes today which are secondary to recurrence of radio-inflammatory changes within left frontal lobe.  She previously demonstrated very good response to corticosteroids.    We recommended the following: -Short course of higher dose decadron, 8mg  daily x3 days -Then decrease to 2mg  daily standing dose, continue through MRI scan and next visit in October 2021.  She will return to clinic as scheduled next month, following next MRI brain.  Again, if symptoms do not respond to steroids, we will need to perform imaging sooner.  We appreciate the opportunity to participate in the care of Raiza Kiesel.    All questions were answered. The patient knows to call the clinic with any problems, questions or concerns. No barriers to learning were detected.  I have spent a total of 30 minutes of face-to-face and non-face-to-face time, excluding clinical staff time, preparing to see patient, ordering tests and/or medications, counseling the patient, and independently interpreting results and communicating results to the patient/family/caregiver    Ventura Sellers, MD Medical Director of Neuro-Oncology [facility] at [place] 09/14/19 11:21 AM

## 2019-10-18 ENCOUNTER — Ambulatory Visit (INDEPENDENT_AMBULATORY_CARE_PROVIDER_SITE_OTHER): Payer: Medicare Other | Admitting: *Deleted

## 2019-10-18 ENCOUNTER — Ambulatory Visit: Payer: Medicare Other | Admitting: Medical

## 2019-10-18 ENCOUNTER — Other Ambulatory Visit: Payer: Self-pay

## 2019-10-18 DIAGNOSIS — Z23 Encounter for immunization: Secondary | ICD-10-CM

## 2019-10-18 NOTE — Progress Notes (Signed)
Patient her for high dose flu vaccine.  Vaccine given in left deltoid and patient tolerated well.

## 2019-10-19 ENCOUNTER — Telehealth: Payer: Self-pay | Admitting: *Deleted

## 2019-10-19 ENCOUNTER — Ambulatory Visit
Admission: RE | Admit: 2019-10-19 | Discharge: 2019-10-19 | Disposition: A | Discharge status: Home / Self Care | Payer: Medicare Other | Source: Ambulatory Visit | Attending: Internal Medicine | Admitting: Internal Medicine

## 2019-10-19 DIAGNOSIS — J3489 Other specified disorders of nose and nasal sinuses: Secondary | ICD-10-CM | POA: Diagnosis not present

## 2019-10-19 DIAGNOSIS — C719 Malignant neoplasm of brain, unspecified: Secondary | ICD-10-CM | POA: Diagnosis not present

## 2019-10-19 DIAGNOSIS — G936 Cerebral edema: Secondary | ICD-10-CM | POA: Diagnosis not present

## 2019-10-19 DIAGNOSIS — G9389 Other specified disorders of brain: Secondary | ICD-10-CM | POA: Diagnosis not present

## 2019-10-19 DIAGNOSIS — D320 Benign neoplasm of cerebral meninges: Secondary | ICD-10-CM

## 2019-10-19 MED ORDER — GADOBENATE DIMEGLUMINE 529 MG/ML IV SOLN
15.0000 mL | Freq: Once | INTRAVENOUS | Status: AC | PRN
  Administered 2019-10-19: 15 mL via INTRAVENOUS

## 2019-10-19 NOTE — Telephone Encounter (Signed)
Received call from [facility] with report of pt's brain MRI done today. Dr. Mickeal Skinner made aware of the results.

## 2019-10-22 ENCOUNTER — Encounter (HOSPITAL_COMMUNITY): Payer: Self-pay | Admitting: Internal Medicine

## 2019-10-22 ENCOUNTER — Emergency Department (HOSPITAL_COMMUNITY): Payer: Medicare Other

## 2019-10-22 ENCOUNTER — Inpatient Hospital Stay: Payer: Medicare Other | Admitting: Internal Medicine

## 2019-10-22 ENCOUNTER — Inpatient Hospital Stay: Payer: Medicare Other | Attending: Internal Medicine

## 2019-10-22 ENCOUNTER — Other Ambulatory Visit: Payer: Self-pay

## 2019-10-22 ENCOUNTER — Inpatient Hospital Stay (HOSPITAL_COMMUNITY)
Admission: EM | Admit: 2019-10-22 | Discharge: 2019-10-25 | DRG: 054 | Disposition: A | Discharge status: Skilled Nursing Facility | Payer: Medicare Other | Attending: Internal Medicine | Admitting: Internal Medicine

## 2019-10-22 ENCOUNTER — Ambulatory Visit: Payer: Medicare Other | Admitting: Medical

## 2019-10-22 ENCOUNTER — Observation Stay (HOSPITAL_COMMUNITY): Payer: Medicare Other

## 2019-10-22 DIAGNOSIS — G9341 Metabolic encephalopathy: Secondary | ICD-10-CM | POA: Diagnosis not present

## 2019-10-22 DIAGNOSIS — Z20822 Contact with and (suspected) exposure to covid-19: Secondary | ICD-10-CM | POA: Diagnosis not present

## 2019-10-22 DIAGNOSIS — R531 Weakness: Secondary | ICD-10-CM | POA: Diagnosis present

## 2019-10-22 DIAGNOSIS — Z803 Family history of malignant neoplasm of breast: Secondary | ICD-10-CM | POA: Diagnosis not present

## 2019-10-22 DIAGNOSIS — R4182 Altered mental status, unspecified: Secondary | ICD-10-CM | POA: Diagnosis not present

## 2019-10-22 DIAGNOSIS — D320 Benign neoplasm of cerebral meninges: Secondary | ICD-10-CM | POA: Diagnosis not present

## 2019-10-22 DIAGNOSIS — R4701 Aphasia: Secondary | ICD-10-CM | POA: Diagnosis present

## 2019-10-22 DIAGNOSIS — R569 Unspecified convulsions: Secondary | ICD-10-CM | POA: Diagnosis not present

## 2019-10-22 DIAGNOSIS — Z85828 Personal history of other malignant neoplasm of skin: Secondary | ICD-10-CM | POA: Diagnosis not present

## 2019-10-22 DIAGNOSIS — D84821 Immunodeficiency due to drugs: Secondary | ICD-10-CM | POA: Diagnosis not present

## 2019-10-22 DIAGNOSIS — R29810 Facial weakness: Secondary | ICD-10-CM | POA: Diagnosis not present

## 2019-10-22 DIAGNOSIS — E785 Hyperlipidemia, unspecified: Secondary | ICD-10-CM | POA: Diagnosis not present

## 2019-10-22 DIAGNOSIS — K589 Irritable bowel syndrome without diarrhea: Secondary | ICD-10-CM | POA: Diagnosis not present

## 2019-10-22 DIAGNOSIS — R9431 Abnormal electrocardiogram [ECG] [EKG]: Secondary | ICD-10-CM | POA: Diagnosis not present

## 2019-10-22 DIAGNOSIS — G936 Cerebral edema: Secondary | ICD-10-CM | POA: Diagnosis present

## 2019-10-22 DIAGNOSIS — Z8249 Family history of ischemic heart disease and other diseases of the circulatory system: Secondary | ICD-10-CM

## 2019-10-22 DIAGNOSIS — D329 Benign neoplasm of meninges, unspecified: Secondary | ICD-10-CM | POA: Diagnosis present

## 2019-10-22 DIAGNOSIS — Z800 Family history of malignant neoplasm of digestive organs: Secondary | ICD-10-CM

## 2019-10-22 DIAGNOSIS — Z923 Personal history of irradiation: Secondary | ICD-10-CM | POA: Diagnosis not present

## 2019-10-22 DIAGNOSIS — R262 Difficulty in walking, not elsewhere classified: Secondary | ICD-10-CM | POA: Diagnosis not present

## 2019-10-22 DIAGNOSIS — R4781 Slurred speech: Secondary | ICD-10-CM | POA: Diagnosis not present

## 2019-10-22 DIAGNOSIS — M810 Age-related osteoporosis without current pathological fracture: Secondary | ICD-10-CM | POA: Diagnosis not present

## 2019-10-22 DIAGNOSIS — R404 Transient alteration of awareness: Secondary | ICD-10-CM | POA: Diagnosis not present

## 2019-10-22 DIAGNOSIS — Z888 Allergy status to other drugs, medicaments and biological substances status: Secondary | ICD-10-CM | POA: Diagnosis not present

## 2019-10-22 DIAGNOSIS — Z7952 Long term (current) use of systemic steroids: Secondary | ICD-10-CM

## 2019-10-22 DIAGNOSIS — R410 Disorientation, unspecified: Secondary | ICD-10-CM | POA: Diagnosis not present

## 2019-10-22 DIAGNOSIS — I5033 Acute on chronic diastolic (congestive) heart failure: Secondary | ICD-10-CM | POA: Diagnosis not present

## 2019-10-22 DIAGNOSIS — R062 Wheezing: Secondary | ICD-10-CM | POA: Diagnosis not present

## 2019-10-22 LAB — DIFFERENTIAL
Abs Immature Granulocytes: 0.1 10*3/uL — ABNORMAL HIGH (ref 0.00–0.07)
Basophils Absolute: 0.1 10*3/uL (ref 0.0–0.1)
Basophils Relative: 1 %
Eosinophils Absolute: 0.3 10*3/uL (ref 0.0–0.5)
Eosinophils Relative: 3 %
Immature Granulocytes: 1 %
Lymphocytes Relative: 14 %
Lymphs Abs: 1.1 10*3/uL (ref 0.7–4.0)
Monocytes Absolute: 0.8 10*3/uL (ref 0.1–1.0)
Monocytes Relative: 9 %
Neutro Abs: 5.8 10*3/uL (ref 1.7–7.7)
Neutrophils Relative %: 72 %

## 2019-10-22 LAB — COMPREHENSIVE METABOLIC PANEL
ALT: 31 U/L (ref 0–44)
AST: 30 U/L (ref 15–41)
Albumin: 3.4 g/dL — ABNORMAL LOW (ref 3.5–5.0)
Alkaline Phosphatase: 51 U/L (ref 38–126)
Anion gap: 13 (ref 5–15)
BUN: 14 mg/dL (ref 8–23)
CO2: 23 mmol/L (ref 22–32)
Calcium: 9.2 mg/dL (ref 8.9–10.3)
Chloride: 105 mmol/L (ref 98–111)
Creatinine, Ser: 0.84 mg/dL (ref 0.44–1.00)
GFR, Estimated: >60 mL/min (ref >60)
Glucose, Bld: 103 mg/dL — ABNORMAL HIGH (ref 70–99)
Potassium: 3.6 mmol/L (ref 3.5–5.1)
Sodium: 141 mmol/L (ref 135–145)
Total Bilirubin: 0.9 mg/dL (ref 0.3–1.2)
Total Protein: 6.7 g/dL (ref 6.5–8.1)

## 2019-10-22 LAB — URINALYSIS, ROUTINE W REFLEX MICROSCOPIC
Bacteria, UA: NONE SEEN
Bilirubin Urine: NEGATIVE
Glucose, UA: 50 mg/dL — AB
Ketones, ur: 20 mg/dL — AB
Leukocytes,Ua: NEGATIVE
Nitrite: NEGATIVE
Protein, ur: NEGATIVE mg/dL
Specific Gravity, Urine: 1.013 (ref 1.005–1.030)
pH: 5 (ref 5.0–8.0)

## 2019-10-22 LAB — RESPIRATORY PANEL BY RT PCR (FLU A&B, COVID)
Influenza A by PCR: NEGATIVE
Influenza B by PCR: NEGATIVE
SARS Coronavirus 2 by RT PCR: NEGATIVE

## 2019-10-22 LAB — CBC
HCT: 42.4 % (ref 36.0–46.0)
Hemoglobin: 13.7 g/dL (ref 12.0–15.0)
MCH: 32.2 pg (ref 26.0–34.0)
MCHC: 32.3 g/dL (ref 30.0–36.0)
MCV: 99.5 fL (ref 80.0–100.0)
Platelets: 230 10*3/uL (ref 150–400)
RBC: 4.26 MIL/uL (ref 3.87–5.11)
RDW: 13.1 % (ref 11.5–15.5)
WBC: 8.1 10*3/uL (ref 4.0–10.5)
nRBC: 0 % (ref 0.0–0.2)

## 2019-10-22 LAB — I-STAT CHEM 8, ED
BUN: 18 mg/dL (ref 8–23)
Calcium, Ion: 1.08 mmol/L — ABNORMAL LOW (ref 1.15–1.40)
Chloride: 105 mmol/L (ref 98–111)
Creatinine, Ser: 0.7 mg/dL (ref 0.44–1.00)
Glucose, Bld: 100 mg/dL — ABNORMAL HIGH (ref 70–99)
HCT: 39 % (ref 36.0–46.0)
Hemoglobin: 13.3 g/dL (ref 12.0–15.0)
Potassium: 3.6 mmol/L (ref 3.5–5.1)
Sodium: 138 mmol/L (ref 135–145)
TCO2: 25 mmol/L (ref 22–32)

## 2019-10-22 LAB — RAPID URINE DRUG SCREEN, HOSP PERFORMED
Amphetamines: NOT DETECTED
Barbiturates: NOT DETECTED
Benzodiazepines: NOT DETECTED
Cocaine: NOT DETECTED
Opiates: NOT DETECTED
Tetrahydrocannabinol: NOT DETECTED

## 2019-10-22 LAB — APTT: aPTT: 26 seconds (ref 24–36)

## 2019-10-22 LAB — PROTIME-INR
INR: 1 (ref 0.8–1.2)
Prothrombin Time: 12.5 seconds (ref 11.4–15.2)

## 2019-10-22 LAB — CBG MONITORING, ED: Glucose-Capillary: 92 mg/dL (ref 70–99)

## 2019-10-22 MED ORDER — SODIUM CHLORIDE 0.9 % IV SOLN
2000.0000 mg | INTRAVENOUS | Status: AC
  Administered 2019-10-22: 2000 mg via INTRAVENOUS
  Filled 2019-10-22: qty 20

## 2019-10-22 MED ORDER — LORAZEPAM 2 MG/ML IJ SOLN: 1.0000 mg | INTRAMUSCULAR | Status: DC | PRN

## 2019-10-22 MED ORDER — DEXAMETHASONE SODIUM PHOSPHATE 4 MG/ML IJ SOLN
4.0000 mg | Freq: Four times a day (QID) | INTRAMUSCULAR | Status: DC
  Administered 2019-10-22 – 2019-10-23 (×3): 4 mg via INTRAVENOUS
  Filled 2019-10-22 (×3): qty 1

## 2019-10-22 MED ORDER — ENOXAPARIN SODIUM 40 MG/0.4ML ~~LOC~~ SOLN: 40.0000 mg | SUBCUTANEOUS | Status: DC

## 2019-10-22 MED ORDER — SODIUM CHLORIDE 0.9% FLUSH
3.0000 mL | Freq: Once | INTRAVENOUS | Status: AC
  Administered 2019-10-22: 3 mL via INTRAVENOUS

## 2019-10-22 MED ORDER — ACETAMINOPHEN 325 MG PO TABS: 650.0000 mg | ORAL_TABLET | ORAL | Status: DC | PRN

## 2019-10-22 MED ORDER — SENNOSIDES-DOCUSATE SODIUM 8.6-50 MG PO TABS: 1.0000 | ORAL_TABLET | Freq: Every evening | ORAL | Status: DC | PRN

## 2019-10-22 MED ORDER — ACETAMINOPHEN 160 MG/5ML PO SOLN: 650.0000 mg | ORAL | Status: DC | PRN

## 2019-10-22 MED ORDER — ACETAMINOPHEN 650 MG RE SUPP: 650.0000 mg | RECTAL | Status: DC | PRN

## 2019-10-22 MED ORDER — EZETIMIBE 10 MG PO TABS
10.0000 mg | ORAL_TABLET | Freq: Every day | ORAL | Status: DC
  Administered 2019-10-23 – 2019-10-25 (×3): 10 mg via ORAL
  Filled 2019-10-22 (×3): qty 1

## 2019-10-22 MED ORDER — DEXAMETHASONE SODIUM PHOSPHATE 10 MG/ML IJ SOLN
10.0000 mg | Freq: Once | INTRAMUSCULAR | Status: AC
  Administered 2019-10-22: 10 mg via INTRAVENOUS
  Filled 2019-10-22: qty 1

## 2019-10-22 MED ORDER — SODIUM CHLORIDE 0.9 % IV SOLN: INTRAVENOUS | Status: DC

## 2019-10-22 NOTE — Consult Note (Addendum)
Neurology Consultation  Reason for Consult: Code stroke Referring Physician: Melina Copa  CC: Altered mental status  History is obtained from: EMS  HPI: Jessicah Croll is a 74 y.o. female with past medical history of skin cancer, hyperlipidemia, left frontal meningioma and cavernous sinus meningioma with known associated T2/FLAIR hyperintensity in L frontal more than R frontal lobe and some baseline R sided weakness. Patient was last known well at 1030 on 10/22/19. Family spoke to her last at 58 and she was able to talk fine. At some point in time in the morning she had fallen however no one had witnessed this.  She was noticed later by family to be very confused, was unable to answer questions or follow commands.  She was immediately brought to the hospital as a code stroke via EMS.  Initially, patient had expressive and receptive aphasia out of proportion to her encephalopathy and having difficulties following directions or any commands. She was noted to have improvement of aphasia later when reexamined after CT scan. and over time patient began to resolve and was more able to follow commands however still remained confused. CT Angio head and Neck as not optained 2/2 improvement in aphasia.  LKW: 10:30 tpa given?: no, patient improving and most likely seizure vs worsening vasogenic edema. Premorbid modified Rankin scale (mRS): 0  NIHSS 1a Level of Conscious.: 0 1b LOC Questions: 2 1c LOC Commands:2 2 Best Gaze: 0 3 Visual: 0 4 Facial Palsy: 0 5a Motor Arm - left: 0 5b Motor Arm - Right: 0 6a Motor Leg - Left: 2 6b Motor Leg - Right: 2 7 Limb Ataxia: 0 8 Sensory: 0 9 Best Language: 2 10 Dysarthria: 0 11 Extinct. and Inatten.: 0 TOTAL: 10   Past Medical History:  Diagnosis Date   Cancer [facility])    skin cancer    History of radiation therapy 01/09/19- 01/19/19   SRS brain radiation. 5 fractions to total 25 Gy   Hyperlipidemia    IBS (irritable bowel syndrome)    Osteoporosis     PONV (postoperative nausea and vomiting)    with breast surgery    Family History  Problem Relation Age of Onset   Breast cancer Mother    Heart disease Father    Colon cancer Maternal Aunt    Social History:   reports that she has never smoked. She has never used smokeless tobacco. She reports that she does not drink alcohol and does not use drugs.  Medications  Current Facility-Administered Medications:    dexamethasone (DECADRON) injection 10 mg, 10 mg, Intravenous, Once, Marliss Coots, PA-C   levETIRAcetam (KEPPRA) 2,000 mg in sodium chloride 0.9 % 250 mL IVPB, 2,000 mg, Intravenous, STAT, Marliss Coots, PA-C, Last Rate: 900 mL/hr at 10/22/19 1222, 2,000 mg at 10/22/19 1222   sodium chloride flush (NS) 0.9 % injection 3 mL, 3 mL, Intravenous, Once, Lajean Saver, MD  Current Outpatient Medications:    Calcium 500-125 MG-UNIT TABS, Take 1 tablet by mouth daily. Chewable, Disp: , Rfl:    dexamethasone (DECADRON) 1 MG tablet, Take 2 tablets (2 mg total) by mouth daily with breakfast., Disp: 60 tablet, Rfl: 3   dicyclomine (BENTYL) 10 MG capsule, Take one by moth 1/2 hour before lunch and at bedtime. (Patient not taking: Reported on 09/14/2019), Disp: 60 capsule, Rfl: 3   ezetimibe (ZETIA) 10 MG tablet, Take 1 tablet (10 mg total) by mouth daily., Disp: 90 tablet, Rfl: 1   Multiple Vitamins-Minerals (HAIR SKIN NAILS PO),  Take 1 tablet by mouth daily. Chewables, Disp: , Rfl:    Prenatal MV & Min w/FA-DHA (PRENATAL ADULT GUMMY/DHA/FA PO), Take 1 tablet by mouth daily. , Disp: , Rfl:   ROS:  Unable to obtain due to altered mental status.   Exam: Current vital signs: There were no vitals taken for this visit. Vital signs in last 24 hours:     Constitutional: Appears well-developed and well-nourished.  Eyes: No scleral injection HENT: No OP obstrucion Head: Normocephalic.  Cardiovascular: Normal rate and regular rhythm.  Respiratory: Effort normal, non-labored breathing GI:  Soft.  No distension. There is no tenderness.  Skin: WDI  Neuro: Mental Status: Is alert and awake, however does not know her location, date, month but is able to state her name.  To the best of her ability she is able to do simple commands which improved over time.  Initially had expressive and receptive aphasia however, this improved over time and was able to follow more instructions and express herself to a larger degree. Cranial Nerves: II: Visual Fields are full.  III,IV, VI: EOMI without ptosis or diploplia. Pupils equal, round and reactive to light V: Facial sensation intact VII: Facial movement is symmetric.  VIII: hearing is intact to voice XII: tongue is midline without atrophy or fasciculations.  Motor: Tone is normal. Bulk is normal. 5/5 strength was present in upper extremities and 3/5 strength in lower extremities however believe that she is likely stronger in her lower extremities but is having difficulty understanding what is asked of her. Drift-no drift in upper extremities however patient would allow her legs to drop to bed when testing drift initially.  After CT scan patient showed the ability to bend her legs to 90 degrees at the knee however still did allow the legs to drop to the bed. No asterixis Sensory: Sensation is symmetric to light touch and temperature in the arms and legs. Deep Tendon Reflexes: 2+ and symmetric in the biceps and patellae.  Plantars: Toes are downgoing bilaterally. Cerebellar: Unable to partake in secondary to mentation  Labs I have reviewed labs in epic and the results pertinent to this consultation are:   CBC    Component Value Date/Time   WBC 8.1 10/22/2019 1151   RBC 4.26 10/22/2019 1151   HGB 13.7 10/22/2019 1151   HCT 42.4 10/22/2019 1151   PLT 230 10/22/2019 1151   MCV 99.5 10/22/2019 1151   MCH 32.2 10/22/2019 1151   MCHC 32.3 10/22/2019 1151   RDW 13.1 10/22/2019 1151   LYMPHSABS 1.1 10/22/2019 1151   MONOABS 0.8  10/22/2019 1151   EOSABS 0.3 10/22/2019 1151   BASOSABS 0.1 10/22/2019 1151    CMP     Component Value Date/Time   NA 138 10/22/2019 1148   NA 141 07/15/2015 0000   NA 141 07/15/2015 0000   K 3.6 10/22/2019 1148   CL 105 10/22/2019 1148   CO2 29 03/15/2019 1338   GLUCOSE 100 (H) 10/22/2019 1148   BUN 18 10/22/2019 1148   BUN 22 (A) 07/15/2015 0000   BUN 22 (A) 07/15/2015 0000   CREATININE 0.70 10/22/2019 1148   CREATININE 0.84 12/26/2018 1217   CALCIUM 9.8 03/15/2019 1338   PROT 6.8 03/15/2019 1338   ALBUMIN 4.0 03/15/2019 1338   AST 18 03/15/2019 1338   ALT 13 03/15/2019 1338   ALKPHOS 75 03/15/2019 1338   BILITOT 0.4 03/15/2019 1338   GFRNONAA >60 12/26/2018 1217   GFRAA >60 12/26/2018 1217  Lipid Panel     Component Value Date/Time   CHOL 247 (H) 03/15/2019 1338   TRIG 214.0 (H) 03/15/2019 1338   HDL 55.30 03/15/2019 1338   CHOLHDL 4 03/15/2019 1338   VLDL 42.8 (H) 03/15/2019 1338   LDLCALC 171 (H) 06/12/2018 1154   LDLDIRECT 157.0 03/15/2019 1338     Imaging I have reviewed the images obtained:  CT-scan of the brain-redemonstrated large left frontal meningioma with exuberant surrounding vasogenic edema, not substantially changed when comparing across modalities to MRI from 10/19/2019.  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  No evidence of superimposed acute large vessel territory infarct or acute hemorrhage.  Aspects is 10.   Etta Quill PA-C [facility] [phone]  M-F  (9:00 am- 5:00 PM)  10/22/2019, 12:25 PM     Assessment: This is a 74 year old female with known history of skin cancer, hyperlipidemia, left frontal meningioma and cavernous sinus meningioma with known associated T2/FLAIR hyperintensity in L frontal more than R frontal lobe and some baseline R sided weakness. Patient was last known well at 1030 on 10/22/19 and brought in for aphasia. CTH was negative for an ICH. Exam improved in the scanner with  resolution of aphasia so further imaging was deferred.  Impression: -Seizure secondary to significant amount of vasogenic edema  Recommendations: - I ordered Decadron 10mg  IV once - Started Decadron 4mg  Q6H. - I ordered Keppra load of 2G IV once - Maintenance AEDs based on cEEG - Seizure precautions - Recommend MRI Brain with and without contrast after cEEG.

## 2019-10-22 NOTE — ED Notes (Addendum)
Patient was very confused and cognition seemed to be deteriorating since last assessment. Patient did not know year, day, or where she was. Dr. Melina Copa notified and at beside.

## 2019-10-22 NOTE — ED Provider Notes (Signed)
[facility]ERGENCY DEPARTMENT Provider Note   CSN: [id number] Arrival date & time: 10/22/19  1138     History No chief complaint on file.   [name] [name] is a 74 y.o. female.  She has a known history of a meningioma x2 and  the left frontal meningioma has increased in size seen on an MRI 3 days ago.  She is here for evaluation of worsening neurologic symptoms that started today.  Difficulty with speech.  Weakness in legs.  She was a code stroke activation by EMS.  Taken emergently to CT by neurology.  Per EMS she was found by family on the floor.  At that time she had slurred speech.  Unclear last known well.  Currently she denies any complaints.  She has no recollection of what happened this morning.  She denies any headache blurry vision double vision numbness or weakness.  No chest pain or shortness of breath.  The history is provided by the EMS personnel and the patient. The history is limited by the condition of the patient.  Cerebrovascular Accident This is a new problem. The problem has not changed since onset.Pertinent negatives include no chest pain, no abdominal pain, no headaches and no shortness of breath. Nothing aggravates the symptoms. Nothing relieves the symptoms. She has tried nothing for the symptoms. The treatment provided no relief.       Past Medical History:  Diagnosis Date  . Cancer ([place])    skin cancer   . History of radiation therapy 01/09/19- 01/19/19   SRS brain radiation. 5 fractions to total 25 Gy  . Hyperlipidemia   . IBS (irritable bowel syndrome)   . Osteoporosis   . PONV (postoperative nausea and vomiting)    with breast surgery    Patient Active Problem List   Diagnosis Date Noted  . Meningioma, cerebral ([place]) 12/22/2018  . Acute pulmonary embolism ([place]) 09/28/2017  . Dyspnea on exertion 09/28/2017  . Hypoxia 09/28/2017  . Meningioma ([place]) 09/07/2017  . FATIGUE 09/12/2009  . MYALGIA 10/31/2008  . Exertional dyspnea  10/31/2008  . Hyperlipidemia 11/11/2006  . OTHER SPECIFIED ANEMIAS 11/11/2006  . GERD 11/11/2006  . Osteoporosis 11/11/2006  . IRRITABLE BOWEL SYNDROME, HX OF 10/20/2006    Past Surgical History:  Procedure Laterality Date  . APPLICATION OF CRANIAL NAVIGATION N/A 09/07/2017   Procedure: APPLICATION OF CRANIAL NAVIGATION;  Surgeon: Kary Kos, MD;  Location: [facility];  Service: Neurosurgery;  Laterality: N/A;  . BRAIN SURGERY  09/2017  . BUNIONECTOMY    . COLONOSCOPY     [facility] Dr Amedeo Plenty probably about 10 years ago  . CRANIOTOMY N/A 09/07/2017   Procedure: CRAINIOTOMY BI CORONAL w/BRAINLAB;  Surgeon: Kary Kos, MD;  Location: [facility];  Service: Neurosurgery;  Laterality: N/A;  . EYE SURGERY Bilateral    cataract surgery  . SKIN CANCER EXCISION    . WISDOM TOOTH EXTRACTION       OB History   No obstetric history on file.     Family History  Problem Relation Age of Onset  . Breast cancer Mother   . Heart disease Father   . Colon cancer Maternal Aunt     Social History   Tobacco Use  . Smoking status: Never Smoker  . Smokeless tobacco: Never Used  Vaping Use  . Vaping Use: Never used  Substance Use Topics  . Alcohol use: No  . Drug use: No    Home Medications Prior to Admission medications  Medication Sig Start Date End Date Taking? Authorizing Provider  Calcium 500-125 MG-UNIT TABS Take 1 tablet by mouth daily. Chewable    [provider]  dexamethasone (DECADRON) 1 MG tablet Take 2 tablets (2 mg total) by mouth daily with breakfast. 09/14/19   Vaslow, Acey Lav, MD  dicyclomine (BENTYL) 10 MG capsule Take one by moth 1/2 hour before lunch and at bedtime. Patient not taking: Reported on 09/14/2019 03/22/19   Jackquline Denmark, MD  ezetimibe (ZETIA) 10 MG tablet Take 1 tablet (10 mg total) by mouth daily. 08/01/19   Copland, Gay Filler, MD  Multiple Vitamins-Minerals (HAIR SKIN NAILS PO) Take 1 tablet by mouth daily. Chewables    [provider]  Prenatal MV &  Min w/FA-DHA (PRENATAL ADULT GUMMY/DHA/FA PO) Take 1 tablet by mouth daily.     [provider]    Allergies    Bee venom and Losartan  Review of Systems   Review of Systems  Constitutional: Negative for fever.  HENT: Negative for sore throat.   Eyes: Negative for visual disturbance.  Respiratory: Negative for shortness of breath.   Cardiovascular: Negative for chest pain.  Gastrointestinal: Negative for abdominal pain.  Genitourinary: Negative for dysuria.  Musculoskeletal: Negative for neck pain.  Skin: Negative for rash.  Neurological: Positive for speech difficulty and weakness. Negative for headaches.    Physical Exam Updated Vital Signs BP (!) 157/85   Pulse 95   Temp 98.6 F (37 C) (Oral)   Resp 18   Ht 5\' 2"  (1.575 m)   Wt 46.7 kg   SpO2 93%   BMI 18.84 kg/m   Physical Exam Vitals and nursing note reviewed.  Constitutional:      General: She is not in acute distress.    Appearance: Normal appearance. She is well-developed.  HENT:     Head: Normocephalic and atraumatic.  Eyes:     Conjunctiva/sclera: Conjunctivae normal.  Cardiovascular:     Rate and Rhythm: Normal rate and regular rhythm.     Heart sounds: No murmur heard.   Pulmonary:     Effort: Pulmonary effort is normal. No respiratory distress.     Breath sounds: Normal breath sounds.  Abdominal:     Palpations: Abdomen is soft.     Tenderness: There is no abdominal tenderness.  Musculoskeletal:        General: No deformity or signs of injury. Normal range of motion.     Cervical back: Neck supple.  Skin:    General: Skin is warm and dry.     Capillary Refill: Capillary refill takes less than 2 seconds.  Neurological:     General: No focal deficit present.     Mental Status: She is alert.     Cranial Nerves: No cranial nerve deficit.     Sensory: No sensory deficit.     Motor: No weakness.     ED Results / Procedures / Treatments   Labs (all labs ordered are listed, but only  abnormal results are displayed) Labs Reviewed  DIFFERENTIAL - Abnormal; Notable for the following components:      Result Value   Abs Immature Granulocytes 0.10 (*)    All other components within normal limits  COMPREHENSIVE METABOLIC PANEL - Abnormal; Notable for the following components:   Glucose, Bld 103 (*)    Albumin 3.4 (*)    All other components within normal limits  I-STAT CHEM 8, ED - Abnormal; Notable for the following components:   Glucose,  Bld 100 (*)    Calcium, Ion 1.08 (*)    All other components within normal limits  RESPIRATORY PANEL BY RT PCR (FLU A&B, COVID)  PROTIME-INR  APTT  CBC  URINALYSIS, ROUTINE W REFLEX MICROSCOPIC  RAPID URINE DRUG SCREEN, HOSP PERFORMED  LIPID PANEL  CBG MONITORING, ED  CBG MONITORING, ED    EKG EKG Interpretation  Date/Time:  Monday October 22 2019 12:16:13 EDT Ventricular Rate:  97 PR Interval:    QRS Duration: 80 QT Interval:  344 QTC Calculation: 437 R Axis:   16 Text Interpretation: Sinus rhythm Borderline T wave abnormalities No significant change since prior 9/19 Confirmed by Aletta Edouard [phone]) on 10/22/2019 12:19:40 PM   Radiology CT HEAD CODE STROKE WO CONTRAST  Result Date: 10/22/2019 CLINICAL DATA:  Code stroke. Neuro deficit, acute stroke suspected. Expressive aphasia, confusion. EXAM: CT HEAD WITHOUT CONTRAST TECHNIQUE: Contiguous axial images were obtained from the base of the skull through the vertex without intravenous contrast. COMPARISON:  MRI 10/19/2019 FINDINGS: Brain: Redemonstrated large left frontal meningioma with exuberant surrounding vasogenic edema, not substantially changed when comparing across modalities to MRI from 10/19/2019. Similar mass effect. 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 No evidence of acute large vascular territory infarct. No acute hemorrhage. No hydrocephalus. Vascular: No hyperdense vessel or unexpected calcification. Skull: Postsurgical changes of  bifrontal craniotomy. Sinuses/Orbits: Scattered ethmoid air cell mucosal thickening with secretions in the posterior left ethmoid air cell. Additionally, secretions within the left sphenoid sinus. No acute orbital abnormality. Other: No mastoid effusions. ASPECTS [facility] Stroke Program Early CT Score) total score (0-10 with 10 being normal): 10 IMPRESSION: 1. Redemonstrated large left frontal meningioma with exuberant surrounding vasogenic edema, not substantially changed when comparing across modalities to MRI from 10/19/2019. 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 2. No evidence of superimposed acute large vascular territory infarct or acute hemorrhage. ASPECTS is 10 Code stroke imaging results were communicated on 10/22/2019 at 11:56 am to provider Dr. Annice Pih via telephone, who verbally acknowledged these results. Electronically Signed   By: Margaretha Sheffield MD   On: 10/22/2019 12:05    Procedures Procedures (including critical care time)  Medications Ordered in ED Medications  ezetimibe (ZETIA) tablet 10 mg (has no administration in time range)  0.9 %  sodium chloride infusion ( Intravenous New Bag/Given 10/22/19 1600)  acetaminophen (TYLENOL) tablet 650 mg (has no administration in time range)    Or  acetaminophen (TYLENOL) 160 MG/5ML solution 650 mg (has no administration in time range)    Or  acetaminophen (TYLENOL) suppository 650 mg (has no administration in time range)  senna-docusate (Senokot-S) tablet 1 tablet (has no administration in time range)  LORazepam (ATIVAN) injection 1-2 mg (has no administration in time range)  sodium chloride flush (NS) 0.9 % injection 3 mL (3 mLs Intravenous Given 10/22/19 1229)  levETIRAcetam (KEPPRA) 2,000 mg in sodium chloride 0.9 % 250 mL IVPB (0 mg Intravenous Stopped 10/22/19 1248)  dexamethasone (DECADRON) injection 10 mg (10 mg Intravenous Given 10/22/19 1238)    ED Course  I have reviewed the triage vital signs  and the nursing notes.  Pertinent labs & imaging results that were available during my care of the patient were reviewed by me and considered in my medical decision making (see chart for details).  Clinical Course as of Oct 21 1757  Mon Oct 22, 2019  1320 Was informed by the nurse that the patient had another episode of confusion.  I  went back and reevaluated her and she is awake and alert although does not recall the date.  She knew this information last time I spoke with her.  There was no seizure activity.  I updated neurology Dr. Lorrin Goodell.  He is arranging for the patient to get continuous EEG.   [MB]    Clinical Course User Index [MB] Hayden Rasmussen, MD   MDM Rules/Calculators/A&P                         Thomasina Housley was evaluated in Emergency Department on 10/22/2019 for the symptoms described in the history of present illness. She was evaluated in the context of the global COVID-19 pandemic, which necessitated consideration that the patient might be at risk for infection with the SARS-CoV-2 virus that causes COVID-19. Institutional protocols and algorithms that pertain to the evaluation of patients at risk for COVID-19 are in a state of rapid change based on information released by regulatory bodies including the CDC and federal and state organizations. These policies and algorithms were followed during the patient's care in the ED.  This patient complains of strokelike symptoms; this involves an extensive number of treatment Options and is a complaint that carries with it a high risk of complications and Morbidity. The differential includes stroke, seizure, tumor, metabolic derangement  I ordered, reviewed and interpreted labs, which included CBC with normal hemoglobin normal white count, chemistries fairly normal, coags normal, Covid testing negative I ordered medication IV Keppra and steroids I ordered imaging studies which included CT head and I independently    visualized  and interpreted imaging which showed no acute findings Additional history obtained from EMS Previous records obtained and reviewed in epic including recent MRI showing enlarging meningioma I consulted neuro hospitalist [facility] hospitalist Dr. Lorin Mercy and discussed lab and imaging findings  Critical Interventions: Work-up of patient's acute neurologic symptoms  After the interventions stated above, I reevaluated the patient and found patient to be hemodynamically stable.  She has had a couple fluctuations in her neuro status.  She is currently getting an EEG.  Will need to be admitted for further work-up.   Final Clinical Impression(s) / ED Diagnoses Final diagnoses:  Altered mental status, unspecified altered mental status type  Meningioma [facility])    Rx / DC Orders ED Discharge Orders    None       Hayden Rasmussen, MD 10/22/19 [phone]

## 2019-10-22 NOTE — Code Documentation (Signed)
Pt is a 74 yr old female with known history of meningioma. She was last known well at 1030 this AM. Then per EMS report, she fell and has been "confused" ever since. Pt arrived to our ED at 1138 via [facility]. She is alert,non-focal and globally aphasic. NIHSS at this time 11 (see flowsheet for details). Her CBG is 92. She was cleared by EDP at 1138 and taken to CT 3 at 1141. NCCT obtained. Negative for acute hemorrhage per Dr Lorrin Goodell.Pt is not eligible for TPA due to existing meningioma. Preparing to obtain CTA when pt's exam got dramatically better: Pt now able to speak and follow verbal commands. NIHSS now 4.CTA cancelled Per Dr Lorrin Goodell, favoring diagnosis of seizure/post-ictal state. Pt not candidate for NIR as clinical exam LVO negative and suggestive of seizure. Code stroke cancelled per Dr Bartholome Bill order at 1202. Pt returned to room 25. Handoff with Johnney Ou.

## 2019-10-22 NOTE — Progress Notes (Signed)
vLTM EEG started. Notified EEG

## 2019-10-22 NOTE — H&P (Signed)
History and Physical    Roshanda Balazs VZC:[id number] DOB: April 25, 1945 DOA: 10/22/2019  PCP: Darreld Mclean, MD Consultants:  Mickeal Skinner - oncology; Lyndel Safe - GIIsidore Moos - rad onc; McQuaid - pulmonology; Penumalli - neurology; Saintclair Halsted - neurosurgery Patient coming from:  Home - lives alone; NOK: Boyfriend, [name], [phone]  Chief Complaint: AMS  HPI: [name] [name] is a 74 y.o. female with medical history significant of IBS; HLD; and meningioma treated with brain radiation in Jan 2021 presenting with AMS.  She fell this AM, unsure how long she was down.  Her boyfriend called at 76 and she had been down.  They broke into the house about 1015 and she was on the floor.  There was some confusion which has gotten worse over the course of the morning.  She was unable to walk - appeared unable to get them to coordinate them.  She talked to her boyfriend 12 hours prior.  She did not lose control of bowel/bladder.  At baseline, she is cognitively intact.  She drove herself to appointments on Friday without difficulty.  She was due for a f/u today with neurology re: MRI.  She has been on a steroid but has been off recently.  She was last seen by Dr. Mickeal Skinner on 9/10.  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  She then completed radiation therapy with Dr. Isidore Moos.  She was having recurrent, stereotypic clinical changes and was treated with high dose decadron x 3 days and then decreased back to 2 mg standing dose.  Repeat MRI on 10/15 showed significant interval increase in size of left frontal meningioma, 5.2 x 4.7 x 3.4 cm with increased mass effect and edema in B frontal lobes.   ED Course:  Has growing meningioma, remote resection.  Stroke symptoms, found down and confused.  CT not acute.  Mental status improved.  Neurology thinks maybe seizure.  Given Keppra load.  Recurrent symptoms in ER.   Given Ativan for tonic-clonic seizures, needs LTM.  Review of Systems: As per HPI; otherwise review of systems reviewed and negative.   Ambulatory Status:  Ambulates without assistance  COVID Vaccine Status:  Complete  Past Medical History:  Diagnosis Date  . Cancer ([place])    skin cancer   . History of radiation therapy 01/09/19- 01/19/19   SRS brain radiation. 5 fractions to total 25 Gy  . Hyperlipidemia   . IBS (irritable bowel syndrome)   . Osteoporosis   . PONV (postoperative nausea and vomiting)    with breast surgery    Past Surgical History:  Procedure Laterality Date  . APPLICATION OF CRANIAL NAVIGATION N/A 09/07/2017   Procedure: APPLICATION OF CRANIAL NAVIGATION;  Surgeon: Kary Kos, MD;  Location: [facility];  Service: Neurosurgery;  Laterality: N/A;  . BRAIN SURGERY  09/2017  . BUNIONECTOMY    . COLONOSCOPY     [facility] Dr Amedeo Plenty probably about 10 years ago  . CRANIOTOMY N/A 09/07/2017   Procedure: CRAINIOTOMY BI CORONAL w/BRAINLAB;  Surgeon: Kary Kos, MD;  Location: [facility];  Service: Neurosurgery;  Laterality: N/A;  . EYE SURGERY Bilateral    cataract surgery  . SKIN CANCER EXCISION    . WISDOM TOOTH EXTRACTION      Social History   Socioeconomic History  . Marital status: Single    Spouse name: Not on file  . Number of children: Not on file  .  Years of education: Not on file  . Highest education level: Not on file  Occupational History  . Not on file  Tobacco Use  . Smoking status: Never Smoker  . Smokeless tobacco: Never Used  Vaping Use  . Vaping Use: Never used  Substance and Sexual Activity  . Alcohol use: No  . Drug use: No  . Sexual activity: Not on file  Other Topics Concern  . Not on file  Social History Narrative   Lives home with home.  Not working.  Education:  HS grad.  [name] [employer].     Social Determinants of Health   Financial Resource Strain:   . Difficulty of Paying Living Expenses: Not on file  Food Insecurity:   . Worried  About Charity fundraiser in the Last Year: Not on file  . Ran Out of Food in the Last Year: Not on file  Transportation Needs:   . Lack of Transportation (Medical): Not on file  . Lack of Transportation (Non-Medical): Not on file  Physical Activity:   . Days of Exercise per Week: Not on file  . Minutes of Exercise per Session: Not on file  Stress:   . Feeling of Stress : Not on file  Social Connections:   . Frequency of Communication with Friends and Family: Not on file  . Frequency of Social Gatherings with Friends and Family: Not on file  . Attends Religious Services: Not on file  . Active Member of Clubs or Organizations: Not on file  . Attends Archivist Meetings: Not on file  . Marital Status: Not on file  Intimate Partner Violence:   . Fear of Current or Ex-Partner: Not on file  . Emotionally Abused: Not on file  . Physically Abused: Not on file  . Sexually Abused: Not on file    Allergies  Allergen Reactions  . Bee Venom Swelling and Other (See Comments)    Site of swelling not recalled by family- happened during childhood  . Losartan Itching    Family History  Problem Relation Age of Onset  . Breast cancer Mother   . Heart disease Father   . Colon cancer Maternal Aunt     Prior to Admission medications   Medication Sig Start Date End Date Taking? Authorizing Provider  Calcium 500-125 MG-UNIT TABS Take 1 tablet by mouth daily. Chewable    [provider]  dexamethasone (DECADRON) 1 MG tablet Take 2 tablets (2 mg total) by mouth daily with breakfast. 09/14/19   Vaslow, Acey Lav, MD  dicyclomine (BENTYL) 10 MG capsule Take one by moth 1/2 hour before lunch and at bedtime. Patient not taking: Reported on 09/14/2019 03/22/19   Jackquline Denmark, MD  ezetimibe (ZETIA) 10 MG tablet Take 1 tablet (10 mg total) by mouth daily. 08/01/19   Copland, Gay Filler, MD  Multiple Vitamins-Minerals (HAIR SKIN NAILS PO) Take 1 tablet by mouth daily. Chewables    [provider]  Prenatal MV & Min w/FA-DHA (PRENATAL ADULT GUMMY/DHA/FA PO) Take 1 tablet by mouth daily.     [provider]    Physical Exam: Vitals:   10/22/19 1330 10/22/19 1345 10/22/19 1400 10/22/19 1415  BP: (!) 155/91 (!) 151/89 (!) 151/93 (!) 157/85  Pulse: 100 98 95 95  Resp: (!) 21 (!) 21 19 18   Temp:      TempSrc:      SpO2: 95% 93% 92% 93%  Weight:      Height:         .  General:  Appears calm but stoic, in NAD . Eyes:  PERRL, EOMI, normal lids, mildly injected conjunctivae L > R . ENT:  grossly normal hearing, lips & tongue, mmm . Neck:  no LAD, masses or thyromegaly . Cardiovascular:  RRR, no m/r/g. No LE edema.  Marland Kitchen Respiratory:   CTA bilaterally with no wheezes/rales/rhonchi.  Normal respiratory effort. . Abdomen:  soft, NT, ND, NABS . Skin:  no rash or induration seen on limited exam . Musculoskeletal:  grossly normal tone BUE/BLE, good ROM, no bony abnormality . Psychiatric:  Flat mood and affect, speech limited, answers some questions but it is not entirely clear if the answers are accurate . Neurologic:  CN 2-12 grossly intact, moves all extremities in coordinated fashion with subtle RLE weakness compared to L    Radiological Exams on Admission: CT HEAD CODE STROKE WO CONTRAST  Result Date: 10/22/2019 CLINICAL DATA:  Code stroke. Neuro deficit, acute stroke suspected. Expressive aphasia, confusion. EXAM: CT HEAD WITHOUT CONTRAST TECHNIQUE: Contiguous axial images were obtained from the base of the skull through the vertex without intravenous contrast. COMPARISON:  MRI 10/19/2019 FINDINGS: Brain: Redemonstrated large left frontal meningioma with exuberant surrounding vasogenic edema, not substantially changed when comparing across modalities to MRI from 10/19/2019. Similar mass effect. 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 No evidence of acute large vascular territory infarct. No acute hemorrhage. No hydrocephalus. Vascular: No  hyperdense vessel or unexpected calcification. Skull: Postsurgical changes of bifrontal craniotomy. Sinuses/Orbits: Scattered ethmoid air cell mucosal thickening with secretions in the posterior left ethmoid air cell. Additionally, secretions within the left sphenoid sinus. No acute orbital abnormality. Other: No mastoid effusions. ASPECTS [facility] Stroke Program Early CT Score) total score (0-10 with 10 being normal): 10 IMPRESSION: 1. Redemonstrated large left frontal meningioma with exuberant surrounding vasogenic edema, not substantially changed when comparing across modalities to MRI from 10/19/2019. 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 2. No evidence of superimposed acute large vascular territory infarct or acute hemorrhage. ASPECTS is 10 Code stroke imaging results were communicated on 10/22/2019 at 11:56 am to provider Dr. Annice Pih via telephone, who verbally acknowledged these results. Electronically Signed   By: Margaretha Sheffield MD   On: 10/22/2019 12:05    EKG: Independently reviewed.  NSR with rate 97; no evidence of acute ischemia   Labs on Admission: I have personally reviewed the available labs and imaging studies at the time of the admission.  Pertinent labs:   Unremarkable CMP Normal CBC INR 1.0 COVID/flu pending   Assessment/Plan Principal Problem:   AMS (altered mental status) Active Problems:   Hyperlipidemia   Meningioma (HCC)    AMS -Patient with known recurrent meningiomas presenting with AMS after being found down at home -She is clearly altered compared to baseline -Neurology has seen and has ordered LTM to evaluate for seizure -Patient d/w Dr. Mickeal Skinner, who reports that this is almost certainly seizure due to drastic short-term worsening -Dr. Mickeal Skinner will consult -Will observe for now on telemetry -Patient was loaded with Keppra 2000 mg -She also will need driving restriction for at least 6 months -Seizure precautions -Ativan  prn -ST evaluation for cognitive/language function  Meningioma -Patient with known h/o meningiomas for which she has had craniotomy and resection as well as radiation therapy -MRI performed on 10/15 shows significant interval increase in size as well as increased mass effect and edema in the B frontal lobes -Dr. Mickeal Skinner reports that the patient is likely to need surgery, inpatient  vs. Outpatient; he will notify Dr. Saintclair Halsted of admission -She was given 10 mg of Decadron in the ER -She usually takes Decadron 2 mg PO daily -Will defer further steroid management to neurology/neurooncology  HLD -Continue Zetia    Note: This patient has been tested and is negative for the novel coronavirus COVID-19. The patient has been fully vaccinated against COVID-19.    DVT prophylaxis:  SCDs due to possible need for surgery Code Status:  Full - confirmed with patient/family Family Communication: 23 daughter was present throughout evaluation Disposition Plan:  The patient is from: home  Anticipated d/c is to: be determined  Anticipated d/c date will depend on clinical response to treatment, but possibly as early as tomorrow if she has excellent response to treatment  Patient is currently: acutely ill Consults called: Neurology; Neurooncology; Possibly neurosurgery; ST Admission status:  It is my clinical opinion that referral for OBSERVATION is reasonable and necessary in this patient based on the above information provided. The aforementioned taken together are felt to place the patient at high risk for further clinical deterioration. However it is anticipated that the patient may be medically stable for discharge from the hospital within 24 to 48 hours.    Karmen Bongo MD [facility]   How to contact the [facility] Attending or Consulting provider [place] or covering provider during after hours [place], for this patient?  1. Check the care team in [facility] and look for a) attending/consulting  TR[facility] provider listed and b) the [facility] team listed 2. Log into [email or web address] and use [place]'s universal password to access. If you do not have the password, please contact the hospital operator. 3. Locate the [facility] provider you are looking for under [facility] and page to a number that you can be directly reached. 4. If you still have difficulty reaching the provider, please page the [facility] (Director on Call) for the Hospitalists listed on amion for assistance.   10/22/2019, 2:52 PM

## 2019-10-22 NOTE — ED Triage Notes (Signed)
Per EMS patient presents from home with confusion. LKW was last night around 9pm. Around 9am said day patient was found down in her bedroom by daughter in law and EMS was called. Patient presents to ED only alert to self . Per EMS Code stroke. LVO negative.   CBG 118 18g r/t AC 18g l/t AC

## 2019-10-22 NOTE — Progress Notes (Signed)
Pt admitted from ED with the c/o of AMS, pt quiet in bed, alert and oriented, denies any pain at this time, settled in bed with call light at bedside, tele monitor put and verified on pt, safety concern explained and initiated, was however reassured and will continue to monitor, v/s stable. Obasogie-Asidi, Deajah Erkkila Efe

## 2019-10-23 ENCOUNTER — Other Ambulatory Visit: Payer: Self-pay

## 2019-10-23 DIAGNOSIS — G9341 Metabolic encephalopathy: Secondary | ICD-10-CM | POA: Diagnosis present

## 2019-10-23 DIAGNOSIS — Z85828 Personal history of other malignant neoplasm of skin: Secondary | ICD-10-CM | POA: Diagnosis not present

## 2019-10-23 DIAGNOSIS — G936 Cerebral edema: Secondary | ICD-10-CM | POA: Diagnosis present

## 2019-10-23 DIAGNOSIS — M810 Age-related osteoporosis without current pathological fracture: Secondary | ICD-10-CM | POA: Diagnosis present

## 2019-10-23 DIAGNOSIS — D84821 Immunodeficiency due to drugs: Secondary | ICD-10-CM | POA: Diagnosis present

## 2019-10-23 DIAGNOSIS — Z7952 Long term (current) use of systemic steroids: Secondary | ICD-10-CM | POA: Diagnosis not present

## 2019-10-23 DIAGNOSIS — R4781 Slurred speech: Secondary | ICD-10-CM | POA: Diagnosis present

## 2019-10-23 DIAGNOSIS — Z20822 Contact with and (suspected) exposure to covid-19: Secondary | ICD-10-CM | POA: Diagnosis present

## 2019-10-23 DIAGNOSIS — R4182 Altered mental status, unspecified: Secondary | ICD-10-CM

## 2019-10-23 DIAGNOSIS — D329 Benign neoplasm of meninges, unspecified: Secondary | ICD-10-CM | POA: Diagnosis not present

## 2019-10-23 DIAGNOSIS — Z800 Family history of malignant neoplasm of digestive organs: Secondary | ICD-10-CM | POA: Diagnosis not present

## 2019-10-23 DIAGNOSIS — Z888 Allergy status to other drugs, medicaments and biological substances status: Secondary | ICD-10-CM | POA: Diagnosis not present

## 2019-10-23 DIAGNOSIS — Z923 Personal history of irradiation: Secondary | ICD-10-CM | POA: Diagnosis not present

## 2019-10-23 DIAGNOSIS — R569 Unspecified convulsions: Secondary | ICD-10-CM | POA: Diagnosis present

## 2019-10-23 DIAGNOSIS — R262 Difficulty in walking, not elsewhere classified: Secondary | ICD-10-CM | POA: Diagnosis present

## 2019-10-23 DIAGNOSIS — D320 Benign neoplasm of cerebral meninges: Secondary | ICD-10-CM | POA: Diagnosis present

## 2019-10-23 DIAGNOSIS — R531 Weakness: Secondary | ICD-10-CM | POA: Diagnosis present

## 2019-10-23 DIAGNOSIS — Z803 Family history of malignant neoplasm of breast: Secondary | ICD-10-CM | POA: Diagnosis not present

## 2019-10-23 DIAGNOSIS — K589 Irritable bowel syndrome without diarrhea: Secondary | ICD-10-CM | POA: Diagnosis present

## 2019-10-23 DIAGNOSIS — E785 Hyperlipidemia, unspecified: Secondary | ICD-10-CM | POA: Diagnosis present

## 2019-10-23 DIAGNOSIS — Z8249 Family history of ischemic heart disease and other diseases of the circulatory system: Secondary | ICD-10-CM | POA: Diagnosis not present

## 2019-10-23 DIAGNOSIS — R4701 Aphasia: Secondary | ICD-10-CM | POA: Diagnosis present

## 2019-10-23 LAB — LIPID PANEL
Cholesterol: 261 mg/dL — ABNORMAL HIGH (ref 0–200)
HDL: 56 mg/dL (ref >40)
LDL Cholesterol: 182 mg/dL — ABNORMAL HIGH (ref 0–99)
Total CHOL/HDL Ratio: 4.7 RATIO
Triglycerides: 115 mg/dL (ref <150)
VLDL: 23 mg/dL (ref 0–40)

## 2019-10-23 MED ORDER — LEVETIRACETAM 500 MG PO TABS
500.0000 mg | ORAL_TABLET | Freq: Two times a day (BID) | ORAL | Status: DC
  Administered 2019-10-23 – 2019-10-25 (×5): 500 mg via ORAL
  Filled 2019-10-23 (×5): qty 1

## 2019-10-23 MED ORDER — ENOXAPARIN SODIUM 40 MG/0.4ML ~~LOC~~ SOLN
40.0000 mg | SUBCUTANEOUS | Status: DC
  Administered 2019-10-23 – 2019-10-25 (×3): 40 mg via SUBCUTANEOUS
  Filled 2019-10-23 (×3): qty 0.4

## 2019-10-23 MED ORDER — DEXAMETHASONE SODIUM PHOSPHATE 4 MG/ML IJ SOLN
4.0000 mg | Freq: Two times a day (BID) | INTRAMUSCULAR | Status: DC
  Administered 2019-10-23 – 2019-10-25 (×4): 4 mg via INTRAVENOUS
  Filled 2019-10-23 (×4): qty 1

## 2019-10-23 NOTE — Progress Notes (Signed)
ANTICOAGULATION CONSULT NOTE - Initial Consult  Pharmacy Consult for Lovenox  Indication: VTE prophylaxis  Allergies  Allergen Reactions  . Bee Venom Swelling and Other (See Comments)    Site of swelling not recalled by family- happened during childhood  . Losartan Itching    Patient Measurements: Height: 5\' 2"  (157.5 cm) Weight: 46.7 kg (103 lb) IBW/kg (Calculated) : 50.1   Vital Signs: Temp: 98.2 F (36.8 C) (10/19 1218) Temp Source: Oral (10/19 1218) BP: 136/76 (10/19 1218) Pulse Rate: 89 (10/19 1218)  Labs: Recent Labs    10/22/19 1148 10/22/19 1151  HGB 13.3 13.7  HCT 39.0 42.4  PLT  --  230  APTT  --  26  LABPROT  --  12.5  INR  --  1.0  CREATININE 0.70 0.84    Estimated Creatinine Clearance: 45.3 mL/min (by C-G formula based on SCr of 0.84 mg/dL).   Medical History: Past Medical History:  Diagnosis Date  . Cancer ([place])    skin cancer   . History of radiation therapy 01/09/19- 01/19/19   SRS brain radiation. 5 fractions to total 25 Gy  . Hyperlipidemia   . IBS (irritable bowel syndrome)   . Osteoporosis   . PONV (postoperative nausea and vomiting)    with breast surgery    Medications:  Medications Prior to Admission  Medication Sig Dispense Refill Last Dose  . acetaminophen (TYLENOL) 325 MG tablet Take 325-650 mg by mouth every 6 (six) hours as needed for mild pain or headache.   unk  . Biotin w/ Vitamins C & E (HAIR SKIN & NAILS GUMMIES PO) Take 1 tablet by mouth daily.   unk  . Calcium 500-100 MG-UNIT CHEW Chew 1 tablet by mouth daily.   unk  . dexamethasone (DECADRON) 1 MG tablet Take 2 tablets (2 mg total) by mouth daily with breakfast. 60 tablet 3 unk  . ezetimibe (ZETIA) 10 MG tablet Take 1 tablet (10 mg total) by mouth daily. 90 tablet 1 ask  . ibuprofen (ADVIL) 200 MG tablet Take 200-400 mg by mouth every 6 (six) hours as needed for headache or mild pain.   unk  . Prenatal MV & Min w/FA-DHA (PRENATAL ADULT GUMMY/DHA/FA PO) Take 1 tablet by  mouth See admin instructions. Chew 1 gummie by mouth daily   unk   Scheduled:  . dexamethasone (DECADRON) injection  4 mg Intravenous Q12H  . enoxaparin (LOVENOX) injection  40 mg Subcutaneous Q24H  . ezetimibe  10 mg Oral Daily  . levETIRAcetam  500 mg Oral BID    Assessment: 74 y.o female  presents with altered mental status, seizure.  History of left frontal meningioma s/p GTR in 2019, path was grade 1 meningioma, as well as a left cavernous meningioma. Post-op, she recovered well but had a PE  (?2019).  Not on anticoagulation PTA, IBS, osteoporosis, HLD  Her weight  is 46.7 kg ,  H/H wnl, Pltc wnl.    Goal of Therapy:  VTE prevention Monitor platelets by anticoagulation protocol: Yes   Plan:  Lovenox 40mg  sq q24h for VTE prophylaxis pharmacy will sign off  Nicole Cella, RPh Clinical Pharmacist  Please check AMION for all [place] phone numbers After 10:00 PM, call [place] [phone] 10/23/2019,2:16 PM

## 2019-10-23 NOTE — Progress Notes (Signed)
No interaction issues with currently antiepileptics (Keppra). Rx will sign off  Onnie Boer, PharmD, [place], AAHIVP, CPP Infectious Disease Pharmacist 10/23/2019 11:29 AM

## 2019-10-23 NOTE — NC FL2 (Addendum)
[place] LEVEL OF CARE SCREENING TOOL     IDENTIFICATION  Patient Name: [name] [name] Birthdate: June 26, 1945 Sex: female Admission Date (Current Location): 10/22/2019  [facility] and [place] Number:  Herbalist and Address:  [facility], [place] [address] [place], [place] [address]      Provider Number: [id number]  Attending Physician Name and Address:  Darliss Cheney, MD  Relative Name and Phone Number:       Current Level of Care: Hospital Recommended Level of Care: [place] Prior Approval Number:    Date Approved/Denied:   PASRR Number: [id number]  Discharge Plan: SNF    Current Diagnoses: Patient Active Problem List   Diagnosis Date Noted  . Acute metabolic encephalopathy 55/73/2202  . AMS (altered mental status) 10/22/2019  . Meningioma, cerebral ([place]) 12/22/2018  . Acute pulmonary embolism ([place]) 09/28/2017  . Dyspnea on exertion 09/28/2017  . Hypoxia 09/28/2017  . Meningioma ([place]) 09/07/2017  . FATIGUE 09/12/2009  . MYALGIA 10/31/2008  . Exertional dyspnea 10/31/2008  . Hyperlipidemia 11/11/2006  . OTHER SPECIFIED ANEMIAS 11/11/2006  . GERD 11/11/2006  . Osteoporosis 11/11/2006  . IRRITABLE BOWEL SYNDROME, HX OF 10/20/2006    Orientation RESPIRATION BLADDER Height & Weight     Self, Situation, Place  Normal Continent Weight: 103 lb (46.7 kg) Height:  5\' 2"  (157.5 cm)  BEHAVIORAL SYMPTOMS/MOOD NEUROLOGICAL BOWEL NUTRITION STATUS      Continent Diet (See DC Summary)  AMBULATORY STATUS COMMUNICATION OF NEEDS Skin   Limited Assist Verbally Normal                       Personal Care Assistance Level of Assistance  Bathing, Feeding, Dressing Bathing Assistance: Limited assistance Feeding assistance: Independent Dressing Assistance: Limited assistance     Functional Limitations Info  Speech     Speech Info: Impaired (Mild Aphasia)    SPECIAL CARE FACTORS FREQUENCY  PT (By  licensed PT), OT (By licensed OT)     PT Frequency: 5xweek OT Frequency: 5xweek            Contractures Contractures Info: Not present    Additional Factors Info  Code Status, Allergies Code Status Info: Full Allergies Info: Bee Venom, Losartan           Current Medications (10/23/2019):  This is the current hospital active medication list Current Facility-Administered Medications  Medication Dose Route Frequency Provider Last Rate Last Admin  . 0.9 %  sodium chloride infusion   Intravenous Continuous Karmen Bongo, MD 50 mL/hr at 10/22/19 1600 New Bag at 10/22/19 1600  . acetaminophen (TYLENOL) tablet 650 mg  650 mg Oral Q4H PRN Karmen Bongo, MD       Or  . acetaminophen (TYLENOL) 160 MG/5ML solution 650 mg  650 mg Per Tube Q4H PRN Karmen Bongo, MD       Or  . acetaminophen (TYLENOL) suppository 650 mg  650 mg Rectal Q4H PRN Karmen Bongo, MD      . dexamethasone (DECADRON) injection 4 mg  4 mg Intravenous Q12H Greta Doom, MD      . ezetimibe (ZETIA) tablet 10 mg  10 mg Oral Daily Karmen Bongo, MD   10 mg at 10/23/19 1026  . levETIRAcetam (KEPPRA) tablet 500 mg  500 mg Oral BID Greta Doom, MD   500 mg at 10/23/19 1026  . LORazepam (ATIVAN) injection 1-2 mg  1-2 mg Intravenous Q2H PRN Karmen Bongo, MD      .  senna-docusate (Senokot-S) tablet 1 tablet  1 tablet Oral QHS PRN Karmen Bongo, MD         Discharge Medications: Please see discharge summary for a list of discharge medications.  Relevant Imaging Results:  Relevant Lab Results:   Additional Information SS# [id number]  Marney Setting, Student-Social Work

## 2019-10-23 NOTE — Progress Notes (Signed)
Inpatient Rehab Admissions Coordinator Note:   Per PT recommendations, pt was screened for CIR candidacy by Shann Medal, PT, DPT.  Noted pt is observation status at this time. Pt may not have the medical necessity to warrant an inpatient rehab stay if they remain observation. If pt were to qualify for inpatient status, AC will screen for candidacy.  Please contact me with questions.   Shann Medal, PT, DPT [phone] 10/23/19 12:16 PM

## 2019-10-23 NOTE — Procedures (Addendum)
Patient Name: [name] [name]  MRN: [id number]  Epilepsy Attending: Lora Havens  Referring Physician/Provider: Dr Donnetta Simpers Duration: 10/22/2019 1345 to 10/23/2019 1050  Patient history: 74 year old female with known large left frontal meningioma and cavernous sinus meningioma with baseline right-sided weakness presented with aphasia.  EEG to evaluate for seizures.  Level of alertness: Awake,asleep  AEDs during EEG study: LEV  Technical aspects: This EEG study was done with scalp electrodes positioned according to the 10-20 International system of electrode placement. Electrical activity was acquired at a sampling rate of 500Hz  and reviewed with a high frequency filter of 70Hz  and a low frequency filter of 1Hz . EEG data were recorded continuously and digitally stored.   Description: The posterior dominant rhythm consists of 9-10 Hz activity of moderate voltage (25-35 uV) seen predominantly in posterior head regions, symmetric and reactive to eye opening and eye closing. Sleep was characterized by vertex waves, sleep spindles (12 to 14 Hz), maximal frontocentral region.  EEG showed 3 to 5 Hz theta-delta slowing in bifrontal region.  Sharp waves were also noted in bifrontal region.  Hyperventilation and photic stimulation were not performed.     ABNORMALITY -Sharp waves, bifrontal -Continuous slow, bifrontal region  IMPRESSION: This study showed evidence of epileptogenicity as well as cortical dysfunction arising from bifrontal region likely secondary to underlying meningioma and edema.  No seizures were seen throughout the recording.  [name] [name]

## 2019-10-23 NOTE — Evaluation (Signed)
Occupational Therapy Evaluation Patient Details Name: [name] [name] MRN: [id number] DOB: February 02, 1945 Today's Date: 10/23/2019    History of Present Illness Pt is a 74 y/o female admitted secondary to fall and seizures. EEG revealed evidence of epileptigenicity. CT showed Redemonstrated large left frontal meningioma. PMH includes skin cancer, meningioma with hx of resection, and osteoporosis.    Clinical Impression   PT admitted with seizure with pending workup due to meningoma. Pt currently with functional limitiations due to the deficits listed below (see OT problem list). Pt currently with balance cognitive and activity tolerance deficits.  Pt will benefit from skilled OT to increase their independence and safety with adls and balance to allow discharge cIR.     Follow Up Recommendations  CIR    Equipment Recommendations  Other (comment) (RW)    Recommendations for Other Services Rehab consult     Precautions / Restrictions Precautions Precautions: Fall      Mobility Bed Mobility Overal bed mobility: Needs Assistance Bed Mobility: Supine to Sit     Supine to sit: Mod assist     General bed mobility comments: Mod A for assist with scooting hips to EOB.   Transfers Overall transfer level: Needs assistance Equipment used: Rolling walker (2 wheeled) Transfers: Sit to/from Stand Sit to Stand: Min assist         General transfer comment: Min A for steadying assist. Pt with posterior lean initially, requiring assist to correct. Cues for hand placement.     Balance Overall balance assessment: Needs assistance Sitting-balance support: No upper extremity supported;Feet supported Sitting balance-Leahy Scale: Fair     Standing balance support: Bilateral upper extremity supported;During functional activity Standing balance-Leahy Scale: Poor Standing balance comment: Reliant on BUE support                           ADL either performed or assessed with  clinical judgement   ADL Overall ADL's : Needs assistance/impaired Eating/Feeding: Set up;Sitting Eating/Feeding Details (indicate cue type and reason): able to open seasoning packet and pour on salmon Grooming: Oral care;Wash/dry hands;Min guard                   Toilet Transfer: Moderate assistance;BSC;RW Toilet Transfer Details (indicate cue type and reason): cues for RW in narrowed space.                  Vision         Perception     Praxis      Pertinent Vitals/Pain Pain Assessment: No/denies pain     Hand Dominance Right   Extremity/Trunk Assessment Upper Extremity Assessment Upper Extremity Assessment: Generalized weakness   Lower Extremity Assessment Lower Extremity Assessment: Defer to PT evaluation   Cervical / Trunk Assessment Cervical / Trunk Assessment: Normal   Communication Communication Communication: No difficulties   Cognition Arousal/Alertness: Awake/alert Behavior During Therapy: WFL for tasks assessed/performed Overall Cognitive Status: Impaired/Different from baseline Area of Impairment: Problem solving;Memory                     Memory: Decreased short-term memory       Problem Solving: Difficulty sequencing General Comments: pt brushing teeth and accepting lunch tray from staff. pt resumes oral care and then after completion pt asked if she would like to eat lunch. pt states "you know i completely forgot it had come. pt with decreased sequencing   General Comments  Exercises     Shoulder Instructions      Home Living Family/patient expects to be discharged to:: Private residence Living Arrangements: Alone Available Help at Discharge: Family;Available 24 hours/day Type of Home: House Home Access: Stairs to enter [employer] of Steps: 2 Entrance Stairs-Rails: None Home Layout: Two level Alternate Level Stairs-Number of Steps: flight Alternate Level Stairs-Rails: Left;Right (R rail half way  up ) Bathroom Shower/Tub: Occupational psychologist: Standard     Home Equipment: None   Additional Comments: fiance can provide supervision, but limited in physical assist. [name] reports he can stay with her at her house if needed      Prior Functioning/Environment Level of Independence: Independent        Comments: Was moving slower per pt report and fatigued more easily        OT Problem List: Decreased strength;Decreased activity tolerance;Impaired balance (sitting and/or standing);Decreased knowledge of use of DME or AE;Decreased safety awareness;Decreased cognition;Decreased knowledge of precautions      OT Treatment/Interventions: Self-care/ADL training;Therapeutic exercise;DME and/or AE instruction;Energy conservation;Therapeutic activities;Cognitive remediation/compensation;Patient/family education;Balance training    OT Goals(Current goals can be found in the care plan section) Acute Rehab OT Goals Patient Stated Goal: to have surgery OT Goal Formulation: With patient/family Time For Goal Achievement: 11/06/19 Potential to Achieve Goals: Good  OT Frequency: Min 2X/week   Barriers to D/C:            Co-evaluation              AM-PAC OT "6 Clicks" Daily Activity     Outcome Measure Help from another person eating meals?: A Little Help from another person taking care of personal grooming?: A Little Help from another person toileting, which includes using toliet, bedpan, or urinal?: A Little Help from another person bathing (including washing, rinsing, drying)?: A Little Help from another person to put on and taking off regular upper body clothing?: A Little Help from another person to put on and taking off regular lower body clothing?: A Little 6 Click Score: 18   End of Session Equipment Utilized During Treatment: Gait belt Nurse Communication: Mobility status;Precautions  Activity Tolerance: Patient tolerated treatment well Patient left: in  chair;with call bell/phone within reach;with chair alarm set;with family/visitor present  OT Visit Diagnosis: Unsteadiness on feet (R26.81)                Time: 9485-4627 OT Time Calculation (min): 27 min Charges:  OT General Charges $OT Visit: 1 Visit OT Evaluation $OT Eval Moderate Complexity: 1 Mod OT Treatments $Self Care/Home Management : 8-22 mins   Brynn, OTR/L  Acute Rehabilitation Services Pager: [phone] Office: [phone] .   Jeri Modena 10/23/2019, 3:50 PM

## 2019-10-23 NOTE — Progress Notes (Signed)
PROGRESS NOTE    [name] [name]  DEY:[id number] DOB: 24-Jul-1945 DOA: 10/22/2019 PCP: Darreld Mclean, MD   Brief Narrative:   HPI: [name] [name] is a 74 y.o. female with medical history significant of IBS; HLD; and meningioma treated with brain radiation in Jan 2021 presenting with AMS.  She fell this AM, unsure how long she was down.  Her boyfriend called at 67 and she had been down.  They broke into the house about 1015 and she was on the floor.  There was some confusion which has gotten worse over the course of the morning.  She was unable to walk - appeared unable to get them to coordinate them.  She talked to her boyfriend 12 hours prior.  She did not lose control of bowel/bladder.  At baseline, she is cognitively intact.  She drove herself to appointments on Friday without difficulty.  She was due for a f/u today with neurology re: MRI.  She has been on a steroid but has been off recently.  She was last seen by Dr. Mickeal Skinner on 9/10.  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  She then completed radiation therapy with Dr. Isidore Moos.  She was having recurrent, stereotypic clinical changes and was treated with high dose decadron x 3 days and then decreased back to 2 mg standing dose.  Repeat MRI on 10/15 showed significant interval increase in size of left frontal meningioma, 5.2 x 4.7 x 3.4 cm with increased mass effect and edema in B frontal lobes.   ED Course:  Has growing meningioma, remote resection.  Stroke symptoms, found down and confused.  CT not acute.  Mental status improved.  Neurology thinks maybe seizure.  Given Keppra load.  Recurrent symptoms in ER.  Given Ativan for tonic-clonic seizures, needs LTM.  Assessment & Plan:   Principal Problem:   AMS (altered mental status) Active Problems:   Hyperlipidemia   Meningioma (HCC)   Acute metabolic  encephalopathy  Acute metabolic encephalopathy/seizures/meningioma: Patient's seizures were likely secondary to rapidly increasing vasogenic edema secondary to her meningioma.  She has not had any seizures since admission.  EEG showed evidence of epileptogenicity as well as cortical dysfunction arising from bifrontal region likely secondary to underlying meningioma and edema.  No seizures were seen throughout the recording.  Patient was loaded with 2 g Keppra and IV dexamethasone and both of them were continued.  Patient's primary oncologist Dr. Mickeal Skinner was consulted.  She was seen by neurology again today and per Dr. Leonel Ramsay, he had discussed with the patient's primary oncologist who recommends discharging patient on dexamethasone 4 mg p.o. twice daily and neurology also recommends Keppra 500 mg p.o. twice daily.  Patient on my evaluation is completely alert and oriented.  She is back to her baseline.  PT OT recommends either CIR or SNF.  Family wants to go to SNF.  TOC on board working on that.  She was also seen by neurosurgery who does not feel that patient needs imminent surgical intervention.  Please see their note for their thoughts in detail.  Hyperlipidemia: Continue Zetia.  Concern of herpes zoster: Patient's fianc at bedside was concerned about possible herpes zoster about left forehead.  Per him, patient was complaining of some pain few weeks ago but the pain resolved after 2 to 3 days and she currently does not have any pain or any itching.  On examination, she  has few scabs or perhaps crusted vesicles, it is hard to tell whether there is any dermatomal distribution since they are scattered on the left forehead.  Since she is asymptomatic, there is no indication of treatment at this point in time.  However she remains at risk for herpes due to immunosuppression and now being on steroids.  DVT prophylaxis: Place and maintain sequential compression device Start: 10/22/19 1459   Code Status:  Full Code  Family Communication: Fianc present at bedside.  Plan of care discussed with patient in length and he verbalized understanding and agreed with it.  Status is: Inpatient  Remains inpatient appropriate because:Inpatient level of care appropriate due to severity of illness   Dispo: The patient is from: Home              Anticipated d/c is to: SNF              Anticipated d/c date is: 1 day              Patient currently is not medically stable to d/c.        Estimated body mass index is 18.84 kg/m as calculated from the following:   Height as of this encounter: 5\' 2"  (1.575 m).   Weight as of this encounter: 46.7 kg.      Nutritional status:               Consultants:   Neurology  Neurosurgery  Procedures:   None  Antimicrobials:  Anti-infectives (From admission, onward)   None         Subjective: Patient seen and examined twice today.  Patient completely alert and oriented.  She has no complaints.  Objective: Vitals:   10/22/19 2343 10/23/19 0347 10/23/19 0852 10/23/19 1218  BP: (!) 151/87 (!) 155/91 (!) 171/78 136/76  Pulse: 90 75 82 89  Resp: 17 16 18 18   Temp: 97.7 F (36.5 C) 97.6 F (36.4 C) 98.2 F (36.8 C) 98.2 F (36.8 C)  TempSrc: Oral  Oral Oral  SpO2: 95% 97% 97% 95%  Weight:      Height:        Intake/Output Summary (Last 24 hours) at 10/23/2019 1342 Last data filed at 10/23/2019 0900 Gross per 24 hour  Intake 789.26 ml  Output --  Net 789.26 ml   Filed Weights   10/22/19 1257  Weight: 46.7 kg    Examination:  General exam: Appears calm and comfortable  Respiratory system: Clear to auscultation. Respiratory effort normal. Cardiovascular system: S1 & S2 heard, RRR. No JVD, murmurs, rubs, gallops or clicks. No pedal edema. Gastrointestinal system: Abdomen is nondistended, soft and nontender. No organomegaly or masses felt. Normal bowel sounds heard. Central nervous system: Alert and oriented. No focal  neurological deficits. Extremities: Symmetric 5 x 5 power. Skin: few scabs on the left forehead.  No vesicles.  Nontender.  No erythema. Psychiatry: Judgement and insight appear normal. Mood & affect appropriate.    Data Reviewed: I have personally reviewed following labs and imaging studies  CBC: Recent Labs  Lab 10/22/19 1148 10/22/19 1151  WBC  --  8.1  NEUTROABS  --  5.8  HGB 13.3 13.7  HCT 39.0 42.4  MCV  --  99.5  PLT  --  161   Basic Metabolic Panel: Recent Labs  Lab 10/22/19 1148 10/22/19 1151  NA 138 141  K 3.6 3.6  CL 105 105  CO2  --  23  GLUCOSE 100* 103*  BUN 18 14  CREATININE 0.70 0.84  CALCIUM  --  9.2   GFR: Estimated Creatinine Clearance: 45.3 mL/min (by C-G formula based on SCr of 0.84 mg/dL). Liver Function Tests: Recent Labs  Lab 10/22/19 1151  AST 30  ALT 31  ALKPHOS 51  BILITOT 0.9  PROT 6.7  ALBUMIN 3.4*   No results for input(s): LIPASE, AMYLASE in the last 168 hours. No results for input(s): AMMONIA in the last 168 hours. Coagulation Profile: Recent Labs  Lab 10/22/19 1151  INR 1.0   Cardiac Enzymes: No results for input(s): CKTOTAL, CKMB, CKMBINDEX, TROPONINI in the last 168 hours. BNP (last 3 results) No results for input(s): PROBNP in the last 8760 hours. HbA1C: No results for input(s): HGBA1C in the last 72 hours. CBG: Recent Labs  Lab 10/22/19 1141  GLUCAP 92   Lipid Profile: Recent Labs    10/23/19 0318  CHOL 261*  HDL 56  LDLCALC 182*  TRIG 115  CHOLHDL 4.7   Thyroid Function Tests: No results for input(s): TSH, T4TOTAL, FREET4, T3FREE, THYROIDAB in the last 72 hours. Anemia Panel: No results for input(s): VITAMINB12, FOLATE, FERRITIN, TIBC, IRON, RETICCTPCT in the last 72 hours. Sepsis Labs: No results for input(s): PROCALCITON, LATICACIDVEN in the last 168 hours.  Recent Results (from the past 240 hour(s))  Respiratory Panel by RT PCR (Flu A&B, Covid) - Nasopharyngeal Swab     Status: None    Collection Time: 10/22/19 12:45 PM   Specimen: Nasopharyngeal Swab  Result Value Ref Range Status   SARS Coronavirus 2 by RT PCR NEGATIVE NEGATIVE Final    Comment: (NOTE) SARS-CoV-2 target nucleic acids are NOT DETECTED.  The SARS-CoV-2 RNA is generally detectable in upper respiratoy specimens during the acute phase of infection. The lowest concentration of SARS-CoV-2 viral copies this assay can detect is 131 copies/mL. A negative result does not preclude SARS-Cov-2 infection and should not be used as the sole basis for treatment or other patient management decisions. A negative result may occur with  improper specimen collection/handling, submission of specimen other than nasopharyngeal swab, presence of viral mutation(s) within the areas targeted by this assay, and inadequate number of viral copies (<131 copies/mL). A negative result must be combined with clinical observations, patient history, and epidemiological information. The expected result is Negative.  Fact Sheet for Patients:  PinkCheek.be  Fact Sheet for Healthcare Providers:  GravelBags.it  This test is no t yet approved or cleared by the [place] FDA and  has been authorized for detection and/or diagnosis of SARS-CoV-2 by FDA under an Emergency Use Authorization (EUA). This EUA will remain  in effect (meaning this test can be used) for the duration of the COVID-19 declaration under Section 564(b)(1) of the Act, 21 U.S.C. section 360bbb-3(b)(1), unless the authorization is terminated or revoked sooner.     Influenza A by PCR NEGATIVE NEGATIVE Final   Influenza B by PCR NEGATIVE NEGATIVE Final    Comment: (NOTE) The Xpert Xpress SARS-CoV-2/FLU/RSV assay is intended as an aid in  the diagnosis of influenza from Nasopharyngeal swab specimens and  should not be used as a sole basis for treatment. Nasal washings and  aspirates are unacceptable for Xpert  Xpress SARS-CoV-2/FLU/RSV  testing.  Fact Sheet for Patients: PinkCheek.be  Fact Sheet for Healthcare Providers: GravelBags.it  This test is not yet approved or cleared by the [place] FDA and  has been authorized for detection and/or diagnosis of SARS-CoV-2 by  FDA under an Emergency Use Authorization (  EUA). This EUA will remain  in effect (meaning this test can be used) for the duration of the  Covid-19 declaration under Section 564(b)(1) of the Act, 21  U.S.C. section 360bbb-3(b)(1), unless the authorization is  terminated or revoked. 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  EEG to evaluate for seizures. Level of alertness: Awake,asleep AEDs during EEG study: LEV Technical aspects: This EEG study was done with scalp electrodes positioned according to the 10-20 International system of electrode placement. Electrical activity was acquired at a sampling rate of 500Hz  and reviewed with a high frequency filter of 70Hz  and a low frequency filter of 1Hz . EEG data were recorded continuously and digitally stored. Description: The posterior dominant rhythm consists of 9-10 Hz activity of moderate voltage (25-35 uV) seen predominantly in posterior head regions, symmetric and reactive to eye opening and eye closing. Sleep was characterized by vertex waves, sleep spindles (12 to 14 Hz), maximal frontocentral region.  EEG showed 3 to 5 Hz  theta-delta slowing in bifrontal region.  Sharp waves were also noted in bifrontal region.  Hyperventilation and photic stimulation were not performed.   ABNORMALITY -Sharp waves, bifrontal -Continuous slow, bifrontal region IMPRESSION: This study showed evidence of epileptogenicity as well as cortical dysfunction arising from bifrontal region likely secondary to underlying meningioma and edema.  No seizures were seen throughout the recording. Lora Havens   CT HEAD CODE STROKE WO CONTRAST  Result Date: 10/22/2019 CLINICAL DATA:  Code stroke. Neuro deficit, acute stroke suspected. Expressive aphasia, confusion. EXAM: CT HEAD WITHOUT CONTRAST TECHNIQUE: Contiguous axial images were obtained from the base of the skull through the vertex without intravenous contrast. COMPARISON:  MRI 10/19/2019 FINDINGS: Brain: Redemonstrated large left frontal meningioma with exuberant surrounding vasogenic edema, not substantially changed when comparing across modalities to MRI from 10/19/2019. Similar mass effect. 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 No evidence of acute large vascular territory infarct. No acute hemorrhage. No hydrocephalus. Vascular: No hyperdense vessel or unexpected calcification. Skull: Postsurgical changes of bifrontal craniotomy. Sinuses/Orbits: Scattered ethmoid air cell mucosal thickening with secretions in the posterior left ethmoid air cell. Additionally, secretions within the left sphenoid sinus. No acute orbital abnormality. Other: No mastoid effusions. ASPECTS [facility] Stroke Program Early CT Score) total score (0-10 with 10 being normal): 10 IMPRESSION: 1. Redemonstrated large left frontal meningioma with exuberant surrounding vasogenic edema, not substantially changed when comparing across modalities to MRI from 10/19/2019. 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 2. No evidence of superimposed acute large vascular territory infarct or acute  hemorrhage. ASPECTS is 10 Code stroke imaging results were communicated on 10/22/2019 at 11:56 am to provider Dr. Annice Pih via telephone, who verbally acknowledged these results. Electronically Signed   By: Margaretha Sheffield MD   On: 10/22/2019 12:05    Scheduled Meds: . dexamethasone (DECADRON) injection  4 mg Intravenous Q12H  . ezetimibe  10 mg Oral Daily  . levETIRAcetam  500 mg Oral BID   Continuous Infusions: . sodium chloride 50 mL/hr at 10/22/19 1600  LOS: 0 days   Time spent: 40 minutes   Darliss Cheney, MD [facility]  10/23/2019, 1:42 PM   To contact the attending provider between 7A-7P or the covering provider during after hours 7P-7A, please log into the web site [email or web address].

## 2019-10-23 NOTE — Progress Notes (Signed)
PT Cancellation Note  Patient Details Name: [name] [name] MRN: [id number] DOB: [birth year]   Cancelled Treatment:    Reason Eval/Treat Not Completed: Other (comment) SLP currently in room. Will follow up as schedule allows.   Lou Miner, DPT  Acute Rehabilitation Services  Pager: [phone] Office: [phone]    Rudean Hitt 10/23/2019, 10:51 AM

## 2019-10-23 NOTE — Consult Note (Signed)
Neurosurgery Consultation  Reason for Consult: Seizures, brain tumor Referring Physician: Lorin Mercy  CC: Seizure  HPI: This is a 74 y.o. woman that presents with altered mental status. She has a history of left frontal meningioma s/p GTR in 2019, path was grade 1 meningioma, as well as a left cavernous meningioma. Post-op, she recovered well but had a PE, but neurologically did well. She then had a recurrence at the resection site, which was treated with SRS in 2020. Her most recent MRI showed substantial interval growth of the L F meningioma, stable size in the cavernous meningioma. Her husband has noticed difficulty with her stamina, difficulty with writing (she is R handed), and balance issues. Today, she feels like she is cognitively roughly 80% of normal, but not back to baseline, no other seizures since admission, no new weakness, numbness, or parasthesias, no auras or similar symptoms. No recent use of anti-platelet or anti-coagulant medications.  ROS: A 14 point ROS was performed and is negative except as noted in the HPI.   PMHx:  Past Medical History:  Diagnosis Date  . Cancer ([place])    skin cancer   . History of radiation therapy 01/09/19- 01/19/19   SRS brain radiation. 5 fractions to total 25 Gy  . Hyperlipidemia   . IBS (irritable bowel syndrome)   . Osteoporosis   . PONV (postoperative nausea and vomiting)    with breast surgery   FamHx:  Family History  Problem Relation Age of Onset  . Breast cancer Mother   . Heart disease Father   . Colon cancer Maternal Aunt    SocHx:  reports that she has never smoked. She has never used smokeless tobacco. She reports that she does not drink alcohol and does not use drugs.  Exam: Vital signs in last 24 hours: Temp:  [97.6 F (36.4 C)-98.6 F (37 C)] 98.2 F (36.8 C) (10/19 0852) Pulse Rate:  [75-113] 82 (10/19 0852) Resp:  [15-29] 18 (10/19 0852) BP: (131-174)/(57-109) 171/78 (10/19 0852) SpO2:  [92 %-97 %] 97 % (10/19  0852) Weight:  [46.7 kg] 46.7 kg (10/18 1257) General: Awake, alert, cooperative, lying in bed in NAD Head: Normocephalic and atruamatic HEENT: Neck supple Pulmonary: breathing room air comfortably, no evidence of increased work of breathing Cardiac: RRR Abdomen: S NT ND Extremities: Warm and well perfused x4 Neuro: AOx3, PERRL, EOMI, FS no drift, speech fluent with normal content   Assessment and Plan: 74 y.o. woman with prior GTR of L F meningioma, WHO grade 1 s/p SRS, now with seizures. MRI brain and CTH personally reviewed, which show significant increase in L F meningioma, now crossing midline and involving the SSS. The posterior aspect of the tumor is a few millimeters anterior to the demarcation of the first third of the SSS. EEG w/ bifrontal sharps.  -no acute neurosurgical intervention indicated at this time, discussed with Dr. Saintclair Halsted and discussed with the patient and her husband, I think that this recurrence vs pseudoprogression will require resection given that she has steroid-dependent functional issues, now with seizures. We discussed that there is progression vs pseudoprogression, repeat resection would require SSS sacrifice, and difficulties of repeat craniotomy, but I think that it is the best treatment option in this scenario.  -I do not think she needs urgent resection unless her seizures become refractory, okay for discharge from my perspective. -please call with any concerns or questions  Judith Part, MD 10/23/19 9:38 AM [facility]

## 2019-10-23 NOTE — Evaluation (Signed)
Speech Language Pathology Evaluation Patient Details Name: [name] [name] MRN: [id number] DOB: May 28, 1945 Today's Date: 10/23/2019 Time: 5456-2563 SLP Time Calculation (min) (ACUTE ONLY): 18 min  Problem List:  Patient Active Problem List   Diagnosis Date Noted  . AMS (altered mental status) 10/22/2019  . Meningioma, cerebral ([place]) 12/22/2018  . Acute pulmonary embolism ([place]) 09/28/2017  . Dyspnea on exertion 09/28/2017  . Hypoxia 09/28/2017  . Meningioma ([place]) 09/07/2017  . FATIGUE 09/12/2009  . MYALGIA 10/31/2008  . Exertional dyspnea 10/31/2008  . Hyperlipidemia 11/11/2006  . OTHER SPECIFIED ANEMIAS 11/11/2006  . GERD 11/11/2006  . Osteoporosis 11/11/2006  . IRRITABLE BOWEL SYNDROME, HX OF 10/20/2006   Past Medical History:  Past Medical History:  Diagnosis Date  . Cancer ([place])    skin cancer   . History of radiation therapy 01/09/19- 01/19/19   SRS brain radiation. 5 fractions to total 25 Gy  . Hyperlipidemia   . IBS (irritable bowel syndrome)   . Osteoporosis   . PONV (postoperative nausea and vomiting)    with breast surgery   Past Surgical History:  Past Surgical History:  Procedure Laterality Date  . APPLICATION OF CRANIAL NAVIGATION N/A 09/07/2017   Procedure: APPLICATION OF CRANIAL NAVIGATION;  Surgeon: Kary Kos, MD;  Location: [facility];  Service: Neurosurgery;  Laterality: N/A;  . BRAIN SURGERY  09/2017  . BUNIONECTOMY    . COLONOSCOPY     [facility] Dr Amedeo Plenty probably about 10 years ago  . CRANIOTOMY N/A 09/07/2017   Procedure: CRAINIOTOMY BI CORONAL w/BRAINLAB;  Surgeon: Kary Kos, MD;  Location: [facility];  Service: Neurosurgery;  Laterality: N/A;  . EYE SURGERY Bilateral    cataract surgery  . SKIN CANCER EXCISION    . WISDOM TOOTH EXTRACTION     HPI:   74 y.o. female with medical history significant for IBS; HLD; recurrent meningioma s/p craniotomy and resection as well as radiation in Jan 2021 presenting with AMS. Repeat MRI on 10/15 showed significant  interval increase in size of left frontal meningioma, 5.2 x 4.7 x 3.4 cm with increased mass effect and edema in B frontal lobes. AMS due likely to seizure.    Assessment / Plan / Recommendation Clinical Impression  Pt presents with a mild aphasia marked by difficulty with higher-level word-retrieval tasks (divergent naming) and difficulty following multistep commands.  Pt's speech is fluent and without dysarthria.  Repetition of high frequency and low frequency words/sentences is intact.  Responsive naming and naming to confrontation are WNL.   D/W pt and her partner.  Recommend repeat language assessment after surgery - they agree.  No further SLP needs at this time.     SLP Assessment  SLP Recommendation/Assessment: Patient does not need any further Speech Lanaguage Pathology Services SLP Visit Diagnosis: Aphasia (R47.01)    Follow Up Recommendations  Other (comment) (repeat language assessment after scheduled crani)    Frequency and Duration           SLP Evaluation Cognition  Overall Cognitive Status: Within Functional Limits for tasks assessed Arousal/Alertness: Awake/alert Orientation Level: Oriented to person;Oriented to place;Oriented to situation Attention: Selective Selective Attention: Appears intact       Comprehension  Auditory Comprehension Overall Auditory Comprehension: Appears within functional limits for tasks assessed Yes/No Questions: Within Functional Limits Commands: Impaired Multistep Basic Commands: 75-100% accurate Conversation: Simple Visual Recognition/Discrimination Discrimination: Within Function Limits Reading Comprehension Reading Status: Not tested    Expression Expression Primary Mode of Expression: Verbal Verbal Expression Overall Verbal Expression:  Impaired Initiation: No impairment Level of Generative/Spontaneous Verbalization: Conversation Repetition: No impairment Naming: Impairment Confrontation: Within functional  limits Convergent: 75-100% accurate Divergent: 25-49% accurate Pragmatics: No impairment Written Expression Dominant Hand: Right Written Expression: Not tested   Oral / Motor  Oral Motor/Sensory Function Overall Oral Motor/Sensory Function: Within functional limits Motor Speech Overall Motor Speech: Appears within functional limits for tasks assessed   GO                    Juan Quam Laurice 10/23/2019, 11:05 AM  Estill Bamberg L. Tivis Ringer, [place] Office number [phone] Pager [phone]

## 2019-10-23 NOTE — TOC Initial Note (Addendum)
Transition of Care [facility]) - Initial/Assessment Note    Patient Details  Name: [name] [name] MRN: [id number] Date of Birth: 1945/09/02  [facility]) CM/SW Contact:    Marney Setting, [place] Work Phone Number: 10/23/2019, 2:05 PM  Clinical Narrative:  MSW student spoke with patient and significant other about potential SNF placement. Patient and significant other are on board for SNF. Faxed out referral and will follow with bed offers.               UPDATE 3:19 PM: MSW Student provided bed offers and spoke to patient and significant other who indicated interest in possible [facility] but will wait until tomorrow to decide on SNF or HH.    Expected Discharge Plan: Skilled Nursing Facility Barriers to Discharge: Insurance Authorization   Patient Goals and CMS Choice Patient states their goals for this hospitalization and ongoing recovery are:: To get rehab CMS Medicare.gov Compare Post Acute Care list provided to:: Patient Represenative (must comment) Choice offered to / list presented to : Patient  Expected Discharge Plan and Services Expected Discharge Plan: [place] In-house Referral: Clinical Social Work   Post Acute Care Choice: [place] Living arrangements for the past 2 months: [place]                                      Prior Living Arrangements/Services Living arrangements for the past 2 months: Single Family Home Lives with:: Significant Other Patient language and need for interpreter reviewed:: No Do you feel safe going back to the place where you live?: Yes      Need for Family Participation in Patient Care: No (Comment) Care giver support system in place?: No (comment)   Criminal Activity/Legal Involvement Pertinent to Current Situation/Hospitalization: No - Comment as needed  Activities of Daily Living Home Assistive Devices/Equipment: Eyeglasses ADL Screening (condition at time of  admission) Patient's cognitive ability adequate to safely complete daily activities?: Yes Is the patient deaf or have difficulty hearing?: No Does the patient have difficulty seeing, even when wearing glasses/contacts?: No Does the patient have difficulty concentrating, remembering, or making decisions?: Yes Patient able to express need for assistance with ADLs?: Yes Does the patient have difficulty dressing or bathing?: Yes Independently performs ADLs?: No Communication: Independent Dressing (OT): Needs assistance Is this a change from baseline?: Change from baseline, expected to last <3days Grooming: Needs assistance Is this a change from baseline?: Change from baseline, expected to last <3 days Feeding: Needs assistance Is this a change from baseline?: Change from baseline, expected to last <3 days Bathing: Needs assistance Is this a change from baseline?: Change from baseline, expected to last <3 days Toileting: Needs assistance Is this a change from baseline?: Change from baseline, expected to last <3 days In/Out Bed: Needs assistance Is this a change from baseline?: Change from baseline, expected to last <3 days Walks in Home: [place] with device (comment) Does the patient have difficulty walking or climbing stairs?: No Weakness of Legs: Both Weakness of Arms/Hands: Both  Permission Sought/Granted Permission sought to share information with : Facility Sport and exercise psychologist, Family Supports Permission granted to share information with : Yes, Verbal Permission Granted  Share Information with NAME: [name]  Permission granted to share info w AGENCY: SNF  Permission granted to share info w Relationship: Boyfriend     Emotional Assessment Appearance:: Appears stated age Attitude/Demeanor/Rapport: Engaged Affect (  typically observed): Calm, Accepting, Adaptable, Pleasant Orientation: : Oriented to Self, Oriented to Place, Oriented to Situation Alcohol / Substance  Use: Not Applicable Psych Involvement: No (comment)  Admission diagnosis:  Meningioma ([place]) [D32.9] Altered mental status, unspecified altered mental status type [R41.82] AMS (altered mental status) [H03.88] Acute metabolic encephalopathy [E28.00] Patient Active Problem List   Diagnosis Date Noted  . Acute metabolic encephalopathy 34/91/7915  . AMS (altered mental status) 10/22/2019  . Meningioma, cerebral ([place]) 12/22/2018  . Acute pulmonary embolism ([place]) 09/28/2017  . Dyspnea on exertion 09/28/2017  . Hypoxia 09/28/2017  . Meningioma ([place]) 09/07/2017  . FATIGUE 09/12/2009  . MYALGIA 10/31/2008  . Exertional dyspnea 10/31/2008  . Hyperlipidemia 11/11/2006  . OTHER SPECIFIED ANEMIAS 11/11/2006  . GERD 11/11/2006  . Osteoporosis 11/11/2006  . IRRITABLE BOWEL SYNDROME, HX OF 10/20/2006   PCP:  Darreld Mclean, MD Pharmacy:   [id number] - [place], [place] - [address] [place] [address] [place] [address] Phone: [phone] Fax: [phone]     Social Determinants of Health (SDOH) Interventions    Readmission Risk Interventions No flowsheet data found.

## 2019-10-23 NOTE — Evaluation (Signed)
Physical Therapy Evaluation Patient Details Name: [name] [name] MRN: [id number] DOB: August 08, 1945 Today's Date: 10/23/2019   History of Present Illness  Pt is a 73 y/o female admitted secondary to fall and seizures. EEG revealed evidence of epileptigenicity. CT showed Redemonstrated large left frontal meningioma. PMH includes skin cancer, meningioma with hx of resection, and osteoporosis.   Clinical Impression  Pt admitted secondary to problem above with deficits below. Pt presenting with R functional weakness, L sided visual deficits, and instability. Pt ran into multiple objects on her L and tended to veer L during ambulation. Required min to mod A throughout gait secondary to instability, and also required manual assist to redirect RW. Per pt's fiance, he can provide supervision, but no heavy physical assist. Pt reports she feels much worse than her baseline. Feel pt would benefit from continued therapies in the post acute setting prior to d/c home. Will continue to follow acutely.     Follow Up Recommendations CIR    Equipment Recommendations  Rolling walker with 5" wheels;3in1 (PT)    Recommendations for Other Services       Precautions / Restrictions Precautions Precautions: Fall Restrictions Weight Bearing Restrictions: No      Mobility  Bed Mobility Overal bed mobility: Needs Assistance Bed Mobility: Supine to Sit     Supine to sit: Mod assist     General bed mobility comments: Mod A for assist with scooting hips to EOB.   Transfers Overall transfer level: Needs assistance Equipment used: Rolling walker (2 wheeled) Transfers: Sit to/from Stand Sit to Stand: Min assist         General transfer comment: Min A for steadying assist. Pt with posterior lean initially, requiring assist to correct. Cues for hand placement.   Ambulation/Gait Ambulation/Gait assistance: Min assist;Mod assist Gait Distance (Feet): 100 Feet Assistive device: Rolling walker (2  wheeled) Gait Pattern/deviations: Step-through pattern;Decreased stride length;Drifts right/left Gait velocity: Decreased   General Gait Details: Pt running into multiple objects on the L. Veered to the L throughout ambulation and required manual assist for correction. Increased fatigue noted. With increased distance noted increased knee instability on the R. Required cues for proximity to device. Pt reporting she could not see in her periphery to the L.   Stairs            Wheelchair Mobility    Modified Rankin (Stroke Patients Only)       Balance Overall balance assessment: Needs assistance Sitting-balance support: No upper extremity supported;Feet supported Sitting balance-Leahy Scale: Fair     Standing balance support: Bilateral upper extremity supported;During functional activity Standing balance-Leahy Scale: Poor Standing balance comment: Reliant on BUE support                             Pertinent Vitals/Pain Pain Assessment: No/denies pain    Home Living Family/patient expects to be discharged to:: Private residence Living Arrangements: Alone Available Help at Discharge: Family;Available 24 hours/day Type of Home: House Home Access: Stairs to enter Entrance Stairs-Rails: None Entrance Stairs-Number of Steps: 2 Home Layout: Two level Home Equipment: None Additional Comments: fiance can provide supervision, but limited in physical assist.     Prior Function Level of Independence: Independent         Comments: Was moving slower per pt report and fatigued more easily     Hand Dominance   Dominant Hand: Right    Extremity/Trunk Assessment   Upper Extremity Assessment Upper Extremity  Assessment: Defer to OT evaluation    Lower Extremity Assessment Lower Extremity Assessment: Generalized weakness;RLE deficits/detail RLE Deficits / Details: Noted functional weakness in RLE, especially with longer distances.     Cervical / Trunk  Assessment Cervical / Trunk Assessment: Normal  Communication   Communication: No difficulties  Cognition Arousal/Alertness: Awake/alert Behavior During Therapy: WFL for tasks assessed/performed Overall Cognitive Status: Impaired/Different from baseline Area of Impairment: Problem solving                             Problem Solving: Difficulty sequencing General Comments: Pt with difficulty sequencing during functional tasks when using RW. Also demonstrated higher level processing difficulty.       General Comments General comments (skin integrity, edema, etc.): Pt's fiance present throughout.     Exercises     Assessment/Plan    PT Assessment Patient needs continued PT services  PT Problem List Decreased strength;Decreased balance;Decreased activity tolerance;Decreased mobility;Decreased cognition;Decreased safety awareness;Decreased knowledge of precautions       PT Treatment Interventions Gait training;DME instruction;Therapeutic activities;Functional mobility training;Stair training;Therapeutic exercise;Balance training;Patient/family education;Cognitive remediation    PT Goals (Current goals can be found in the Care Plan section)  Acute Rehab PT Goals Patient Stated Goal: to be more independent PT Goal Formulation: With patient Time For Goal Achievement: 11/06/19 Potential to Achieve Goals: Good    Frequency Min 3X/week   Barriers to discharge        Co-evaluation               AM-PAC PT "6 Clicks" Mobility  Outcome Measure Help needed turning from your back to your side while in a flat bed without using bedrails?: A Little Help needed moving from lying on your back to sitting on the side of a flat bed without using bedrails?: A Lot Help needed moving to and from a bed to a chair (including a wheelchair)?: A Little Help needed standing up from a chair using your arms (e.g., wheelchair or bedside chair)?: A Little Help needed to walk in hospital  room?: A Lot Help needed climbing 3-5 steps with a railing? : A Lot 6 Click Score: 15    End of Session Equipment Utilized During Treatment: Gait belt Activity Tolerance: Patient limited by fatigue Patient left: in bed;with call bell/phone within reach;with bed alarm set;with family/visitor present (sitting EOB ) Nurse Communication: Mobility status PT Visit Diagnosis: Unsteadiness on feet (R26.81);Muscle weakness (generalized) (M62.81);History of falling (Z91.81);Difficulty in walking, not elsewhere classified (R26.2)    Time: 1660-6301 PT Time Calculation (min) (ACUTE ONLY): 26 min   Charges:   PT Evaluation $PT Eval Moderate Complexity: 1 Mod PT Treatments $Gait Training: 8-22 mins        Lou Miner, DPT  Acute Rehabilitation Services  Pager: [phone] Office: [phone]   Rudean Hitt 10/23/2019, 12:07 PM

## 2019-10-23 NOTE — Progress Notes (Signed)
Subjective: Patient is essentially back to baseline, she does not remember how she fell or why she was unable to get up off the floor.  Exam: Vitals:   10/23/19 0347 10/23/19 0852  BP: (!) 155/91 (!) 171/78  Pulse: 75 82  Resp: 16 18  Temp: 97.6 F (36.4 C) 98.2 F (36.8 C)  SpO2: 97% 97%   Gen: In bed, NAD Resp: non-labored breathing, no acute distress Abd: soft, nt  Neuro: MS: Awake, alert, oriented and appropriate CN: Visual fields full,?  Mild right facial decreased nasolabial fold Motor: No drift or weakness to confrontation Sensory: Intact light touch  Pertinent Labs: Creatinine 0.8  Impression: 74 year old female with unwitnessed fall and subsequent aphasia/confusion.  Especially with the epileptogenicity seen on EEG, I strongly suspect that she had a seizure.  I would favor antiepileptic therapy moving forward.  I discussed steroid dose with her neuro oncologist, and he recommended discharging her on 4 mg twice daily of Decadron.  Recommendations: 1) decrease Decadron to 4 mg twice daily 2) Keppra 500 mg twice daily 3) follow-up with Dr. Mickeal Skinner on Thursday. 4) discontinue EEG  5) please call if neurology can be of any further assistance.   Roland Rack, MD [facility] [phone]  If 7pm- 7am, please page neurology on call as listed in [place].

## 2019-10-23 NOTE — Progress Notes (Signed)
LTM EEG discontinued - no skin breakdown at unhook.   

## 2019-10-24 ENCOUNTER — Telehealth: Payer: Self-pay | Admitting: Family Medicine

## 2019-10-24 DIAGNOSIS — E785 Hyperlipidemia, unspecified: Secondary | ICD-10-CM

## 2019-10-24 DIAGNOSIS — D329 Benign neoplasm of meninges, unspecified: Secondary | ICD-10-CM | POA: Diagnosis not present

## 2019-10-24 LAB — BASIC METABOLIC PANEL
Anion gap: 10 (ref 5–15)
BUN: 19 mg/dL (ref 8–23)
CO2: 23 mmol/L (ref 22–32)
Calcium: 8.9 mg/dL (ref 8.9–10.3)
Chloride: 109 mmol/L (ref 98–111)
Creatinine, Ser: 0.76 mg/dL (ref 0.44–1.00)
GFR, Estimated: >60 mL/min (ref >60)
Glucose, Bld: 138 mg/dL — ABNORMAL HIGH (ref 70–99)
Potassium: 3.7 mmol/L (ref 3.5–5.1)
Sodium: 142 mmol/L (ref 135–145)

## 2019-10-24 LAB — CBC WITH DIFFERENTIAL/PLATELET
Abs Immature Granulocytes: 0.14 10*3/uL — ABNORMAL HIGH (ref 0.00–0.07)
Basophils Absolute: 0 10*3/uL (ref 0.0–0.1)
Basophils Relative: 0 %
Eosinophils Absolute: 0 10*3/uL (ref 0.0–0.5)
Eosinophils Relative: 0 %
HCT: 36.7 % (ref 36.0–46.0)
Hemoglobin: 12.3 g/dL (ref 12.0–15.0)
Immature Granulocytes: 1 %
Lymphocytes Relative: 5 %
Lymphs Abs: 0.8 10*3/uL (ref 0.7–4.0)
MCH: 32.3 pg (ref 26.0–34.0)
MCHC: 33.5 g/dL (ref 30.0–36.0)
MCV: 96.3 fL (ref 80.0–100.0)
Monocytes Absolute: 0.6 10*3/uL (ref 0.1–1.0)
Monocytes Relative: 3 %
Neutro Abs: 15.6 10*3/uL — ABNORMAL HIGH (ref 1.7–7.7)
Neutrophils Relative %: 91 %
Platelets: 235 10*3/uL (ref 150–400)
RBC: 3.81 MIL/uL — ABNORMAL LOW (ref 3.87–5.11)
RDW: 12.9 % (ref 11.5–15.5)
WBC: 17.1 10*3/uL — ABNORMAL HIGH (ref 4.0–10.5)
nRBC: 0 % (ref 0.0–0.2)

## 2019-10-24 NOTE — TOC Progression Note (Addendum)
Transition of Care [facility]) - Progression Note    Patient Details  Name: Gissel Keilman MRN: [id number] Date of Birth: February 23, 1945  [facility]) CM/SW Contact  Pollie Friar, RN Phone Number: 10/24/2019, 11:32 AM  Clinical Narrative:    CM met with the patient and her significant other. They are concerned about d/cing home prior to surgery. CM has provided them the SNF rehabs that have offered a bed but they would like PT to re-see the patient today before making any decisions. CM has sent PT a secure message about significant others concerns.  CM has clarified with CIR that they will not offer a bed prior to surgery.  TOC is following.  1546: CM has submitted for insurance for SNF rehab. CM spoke to pt and significant other again about SNF rehab and they will let me know their choice in am.   Expected Discharge Plan: [place] Barriers to Discharge: Insurance Authorization  Expected Discharge Plan and Services Expected Discharge Plan: [place] In-house Referral: Clinical Social Work   Post Acute Care Choice: [place] Living arrangements for the past 2 months: Single Family Home                                       Social Determinants of Health (SDOH) Interventions    Readmission Risk Interventions No flowsheet data found.

## 2019-10-24 NOTE — Telephone Encounter (Signed)
Called and spoke with SO Zack Seal is admitted right now, she is doing ok.,  They plan to do surgery for her next week   I thanked him for calling us

## 2019-10-24 NOTE — Telephone Encounter (Signed)
Per Patient's Emergency Contact, patient is hospitalized and that why missed her appointment on Monday, patient had a seizure and fell, she was taking to ED and admitted per contact , the doctor believes its related to her brain tumor. She will under go surgery next week to remove the rest of tumor.

## 2019-10-24 NOTE — Progress Notes (Signed)
Physical Therapy Treatment Patient Details Name: [name] [name] MRN: [id number] DOB: [birth year] Today's Date: 10/24/2019    History of Present Illness Pt is a 74 y/o female admitted secondary to fall and seizures. EEG revealed evidence of epileptigenicity. CT showed Redemonstrated large left frontal meningioma. PMH includes skin cancer, meningioma with hx of resection, and osteoporosis.     PT Comments    Pt tolerates treatment well despite expressing fatigue during session with bed mobility. Pt continues to demonstrate impaired attention to R side, bumping into wall 3 times during session despite PT cues for scanning to R. Pt also requires assistance to maintain sitting balance with one posterior LOB during session prior to achieving feet flat on floor. Due to limited physical assistance available from the pt's primary caregiver, and the pt's current high falls risk, PT recommends SNF placement at this time.   Follow Up Recommendations  SNF     Equipment Recommendations  Rolling walker with 5" wheels;3in1 (PT)    Recommendations for Other Services       Precautions / Restrictions Precautions Precautions: Fall Restrictions Weight Bearing Restrictions: No    Mobility  Bed Mobility Overal bed mobility: Needs Assistance Bed Mobility: Supine to Sit     Supine to sit: Min assist;HOB elevated        Transfers Overall transfer level: Needs assistance Equipment used: Rolling walker (2 wheeled) Transfers: Sit to/from Stand Sit to Stand: Min guard            Ambulation/Gait Ambulation/Gait assistance: Herbalist (Feet): 120 Feet Assistive device: Rolling walker (2 wheeled) Gait Pattern/deviations: Step-to pattern Gait velocity: reduced Gait velocity interpretation: <1.8 ft/sec, indicate of risk for recurrent falls General Gait Details: pt with slowed step to gait, drifts to right and bumps into 3 objects on R side during session despite PT cues for  scanning to R side   Stairs             Wheelchair Mobility    Modified Rankin (Stroke Patients Only)       Balance Overall balance assessment: Needs assistance Sitting-balance support: No upper extremity supported;Feet supported Sitting balance-Leahy Scale: Good Sitting balance - Comments: supervision   Standing balance support: Single extremity supported;During functional activity Standing balance-Leahy Scale: Fair Standing balance comment: close supervision to brush teeth at sink                            Cognition Arousal/Alertness: Awake/alert Behavior During Therapy: WFL for tasks assessed/performed Overall Cognitive Status: Impaired/Different from baseline Area of Impairment: Memory;Following commands;Safety/judgement;Awareness;Problem solving                     Memory: Decreased short-term memory Following Commands: Follows one step commands consistently;Follows multi-step commands with increased time Safety/Judgement: Decreased awareness of safety;Decreased awareness of deficits Awareness: Emergent Problem Solving: Slow processing        Exercises      General Comments General comments (skin integrity, edema, etc.): VSS on RA      Pertinent Vitals/Pain Pain Assessment: No/denies pain    Home Living                      Prior Function            PT Goals (current goals can now be found in the care plan section) Acute Rehab PT Goals Patient Stated Goal: to have surgery Progress towards PT goals: Progressing  toward goals    Frequency    Min 3X/week      PT Plan Current plan remains appropriate    Co-evaluation              AM-PAC PT "6 Clicks" Mobility   Outcome Measure  Help needed turning from your back to your side while in a flat bed without using bedrails?: A Little Help needed moving from lying on your back to sitting on the side of a flat bed without using bedrails?: A Little Help needed  moving to and from a bed to a chair (including a wheelchair)?: A Little Help needed standing up from a chair using your arms (e.g., wheelchair or bedside chair)?: A Little Help needed to walk in hospital room?: A Little Help needed climbing 3-5 steps with a railing? : A Lot 6 Click Score: 17    End of Session   Activity Tolerance: Patient tolerated treatment well Patient left: in chair;with call bell/phone within reach;with chair alarm set Nurse Communication: Mobility status PT Visit Diagnosis: Unsteadiness on feet (R26.81);Muscle weakness (generalized) (M62.81);History of falling (Z91.81);Difficulty in walking, not elsewhere classified (R26.2)     Time: 5009-3818 PT Time Calculation (min) (ACUTE ONLY): 38 min  Charges:  $Gait Training: 8-22 mins $Therapeutic Activity: 23-37 mins                     Zenaida Niece, PT, DPT Acute Rehabilitation Pager: [phone]    Zenaida Niece 10/24/2019, 2:31 PM

## 2019-10-24 NOTE — Progress Notes (Signed)
PROGRESS NOTE    [name] [name]  KVQ:[id number] DOB: October 28, 1945 DOA: 10/22/2019 PCP: Darreld Mclean, MD   Brief Narrative:   HPI: [name] [name] is a 74 y.o. female with medical history significant of IBS; HLD; and meningioma treated with brain radiation in Jan 2021 presenting with AMS.  She fell this AM, unsure how long she was down.  Her boyfriend called at 21 and she had been down.  They broke into the house about 1015 and she was on the floor.  There was some confusion which has gotten worse over the course of the morning.  She was unable to walk - appeared unable to get them to coordinate them.  She talked to her boyfriend 12 hours prior.  She did not lose control of bowel/bladder.  At baseline, she is cognitively intact.  She drove herself to appointments on Friday without difficulty.  She was due for a f/u today with neurology re: MRI.  She has been on a steroid but has been off recently.  She was last seen by Dr. Mickeal Skinner on 9/10.  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  She then completed radiation therapy with Dr. Isidore Moos.  She was having recurrent, stereotypic clinical changes and was treated with high dose decadron x 3 days and then decreased back to 2 mg standing dose.  Repeat MRI on 10/15 showed significant interval increase in size of left frontal meningioma, 5.2 x 4.7 x 3.4 cm with increased mass effect and edema in B frontal lobes. In ED: Has growing meningioma, remote resection.  Stroke symptoms, found down and confused.  CT not acute.  Mental status improved.  Neurology thinks maybe seizure.  Given Keppra load.  Recurrent symptoms in ER.  Given Ativan for tonic-clonic seizures, needs LTM.  Assessment & Plan:   Principal Problem:   AMS (altered mental status) Active Problems:   Hyperlipidemia   Meningioma (HCC)   Acute metabolic encephalopathy   Acute  metabolic encephalopathy/seizures/meningioma:  -Recurrent seizure in the setting of likely increasing edema from meningioma  -Well-known to neuro surgical services here -no indication for urgent or emergent surgery, evaluated by Dr. Mickeal Skinner oncology as well  -Continue Keppra and dexamethasone 4 mg twice daily, likely to continue up until surgical date  - PT OT recommends either CIR versus SNF.  Hyperlipidemia: Continue Zetia.  Concern of herpes zoster:  -Fianc indicates right upper forehead vesicles previously noted to be painful -Appear to be resolving at this time, asymptomatic, no indication for current treatment -Continue to monitor given immunosuppressed state on steroids  DVT prophylaxis: enoxaparin (LOVENOX) injection 40 mg Start: 10/23/19 1515 Place and maintain sequential compression device Start: 10/22/19 1459   Code Status: Full Code  Family Communication: Fianc present at bedside.  Lengthy discussion about plan for discharge and likely need for surgical evaluation and intervention in the near future.  Status is: Inpatient  Remains inpatient appropriate because:Inpatient level of care appropriate due to severity of illness   Dispo: The patient is from: Home              Anticipated d/c is to: SNF              Anticipated d/c date is: 1 day              Patient currently is not medically stable to d/c.  Estimated body mass index is 18.84 kg/m as  calculated from the following:   Height as of this encounter: 5\' 2"  (1.575 m).   Weight as of this encounter: 46.7 kg.  Consultants:   Neurology  Neurosurgery  Procedures:   None  Antimicrobials:  Anti-infectives (From admission, onward)   None      Subjective: No acute issues or events overnight, patient evaluated at bedside with fianc in the room.  Denies nausea, vomiting, diarrhea, constipation, headache, fevers, chills.  Patient and husband excited to initiate physical therapy in hopes for possible discharge to  SNF prior to further evaluation with neurosurgery for meningioma evaluation and likely removal.  Objective: Vitals:   10/23/19 1541 10/23/19 1956 10/23/19 2328 10/24/19 0342  BP: 136/85 126/77 (!) 144/85 (!) 143/86  Pulse: 94 94 87 80  Resp: 18 16 17 18   Temp: 98.2 F (36.8 C) 98 F (36.7 C) 97.7 F (36.5 C) 97.7 F (36.5 C)  TempSrc: Oral Oral Oral Oral  SpO2: 96% 95% 96% 97%  Weight:      Height:        Intake/Output Summary (Last 24 hours) at 10/24/2019 0742 Last data filed at 10/23/2019 1700 Gross per 24 hour  Intake 720 ml  Output 400 ml  Net 320 ml   Filed Weights   10/22/19 1257  Weight: 46.7 kg    Examination:  General exam: Appears calm and comfortable  Respiratory system: Clear to auscultation. Respiratory effort normal. Cardiovascular system: S1 & S2 heard, RRR. No JVD, murmurs, rubs, gallops or clicks. No pedal edema. Gastrointestinal system: Abdomen is nondistended, soft and nontender. No organomegaly or masses felt. Normal bowel sounds heard. Central nervous system: Alert and oriented. No focal neurological deficits. Extremities: Symmetric 5 x 5 power. Skin: few scabs on the left forehead.  No vesicles.  Nontender.  No erythema. Psychiatry: Judgement and insight appear normal. Mood & affect appropriate.    Data Reviewed: I have personally reviewed following labs and imaging studies  CBC: Recent Labs  Lab 10/22/19 1148 10/22/19 1151 10/24/19 0505  WBC  --  8.1 17.1*  NEUTROABS  --  5.8 15.6*  HGB 13.3 13.7 12.3  HCT 39.0 42.4 36.7  MCV  --  99.5 96.3  PLT  --  230 916   Basic Metabolic Panel: Recent Labs  Lab 10/22/19 1148 10/22/19 1151 10/24/19 0505  NA 138 141 142  K 3.6 3.6 3.7  CL 105 105 109  CO2  --  23 23  GLUCOSE 100* 103* 138*  BUN 18 14 19   CREATININE 0.70 0.84 0.76  CALCIUM  --  9.2 8.9   GFR: Estimated Creatinine Clearance: 47.6 mL/min (by C-G formula based on SCr of 0.76 mg/dL). Liver Function Tests: Recent Labs    Lab 10/22/19 1151  AST 30  ALT 31  ALKPHOS 51  BILITOT 0.9  PROT 6.7  ALBUMIN 3.4*   No results for input(s): LIPASE, AMYLASE in the last 168 hours. No results for input(s): AMMONIA in the last 168 hours. Coagulation Profile: Recent Labs  Lab 10/22/19 1151  INR 1.0   Cardiac Enzymes: No results for input(s): CKTOTAL, CKMB, CKMBINDEX, TROPONINI in the last 168 hours. BNP (last 3 results) No results for input(s): PROBNP in the last 8760 hours. HbA1C: No results for input(s): HGBA1C in the last 72 hours. CBG: Recent Labs  Lab 10/22/19 1141  GLUCAP 92   Lipid Profile: Recent Labs    10/23/19 0318  CHOL 261*  HDL 56  LDLCALC 182*  TRIG 115  CHOLHDL  4.7   Thyroid Function Tests: No results for input(s): TSH, T4TOTAL, FREET4, T3FREE, THYROIDAB in the last 72 hours. Anemia Panel: No results for input(s): VITAMINB12, FOLATE, FERRITIN, TIBC, IRON, RETICCTPCT in the last 72 hours. Sepsis Labs: No results for input(s): PROCALCITON, LATICACIDVEN in the last 168 hours.  Recent Results (from the past 240 hour(s))  Respiratory Panel by RT PCR (Flu A&B, Covid) - Nasopharyngeal Swab     Status: None   Collection Time: 10/22/19 12:45 PM   Specimen: Nasopharyngeal Swab  Result Value Ref Range Status   SARS Coronavirus 2 by RT PCR NEGATIVE NEGATIVE Final    Comment: (NOTE) SARS-CoV-2 target nucleic acids are NOT DETECTED.  The SARS-CoV-2 RNA is generally detectable in upper respiratoy specimens during the acute phase of infection. The lowest concentration of SARS-CoV-2 viral copies this assay can detect is 131 copies/mL. A negative result does not preclude SARS-Cov-2 infection and should not be used as the sole basis for treatment or other patient management decisions. A negative result may occur with  improper specimen collection/handling, submission of specimen other than nasopharyngeal swab, presence of viral mutation(s) within the areas targeted by this assay, and  inadequate number of viral copies (<131 copies/mL). A negative result must be combined with clinical observations, patient history, and epidemiological information. The expected result is Negative.  Fact Sheet for Patients:  PinkCheek.be  Fact Sheet for Healthcare Providers:  GravelBags.it  This test is no t yet approved or cleared by the [place] FDA and  has been authorized for detection and/or diagnosis of SARS-CoV-2 by FDA under an Emergency Use Authorization (EUA). This EUA will remain  in effect (meaning this test can be used) for the duration of the COVID-19 declaration under Section 564(b)(1) of the Act, 21 U.S.C. section 360bbb-3(b)(1), unless the authorization is terminated or revoked sooner.     Influenza A by PCR NEGATIVE NEGATIVE Final   Influenza B by PCR NEGATIVE NEGATIVE Final    Comment: (NOTE) The Xpert Xpress SARS-CoV-2/FLU/RSV assay is intended as an aid in  the diagnosis of influenza from Nasopharyngeal swab specimens and  should not be used as a sole basis for treatment. Nasal washings and  aspirates are unacceptable for Xpert Xpress SARS-CoV-2/FLU/RSV  testing.  Fact Sheet for Patients: PinkCheek.be  Fact Sheet for Healthcare Providers: GravelBags.it  This test is not yet approved or cleared by the [place] FDA and  has been authorized for detection and/or diagnosis of SARS-CoV-2 by  FDA under an Emergency Use Authorization (EUA). This EUA will remain  in effect (meaning this test can be used) for the duration of the  Covid-19 declaration under Section 564(b)(1) of the Act, 21  U.S.C. section 360bbb-3(b)(1), unless the authorization is  terminated or revoked. Performed at [facility], [place] [address] [place], [place] [address]       Radiology Studies: Overnight EEG with video  Result Date: 10/23/2019 Lora Havens, MD     10/23/2019  8:42 AM Patient Name: [name] [name] MRN: [id number] Epilepsy Attending: Lora Havens Referring Physician/Provider: Dr Donnetta Simpers Duration: 10/22/2019 1345 to 10/23/2019 0830 Patient history: 74 year old female with known large left frontal meningioma and cavernous sinus meningioma with baseline right-sided weakness presented with aphasia.  EEG to evaluate for seizures. Level of alertness: Awake,asleep AEDs during EEG study: LEV Technical aspects: This EEG study was done with scalp electrodes positioned according to the 10-20 International system of electrode placement. Electrical activity was acquired at a sampling rate  of 500Hz  and reviewed with a high frequency filter of 70Hz  and a low frequency filter of 1Hz . EEG data were recorded continuously and digitally stored. Description: The posterior dominant rhythm consists of 9-10 Hz activity of moderate voltage (25-35 uV) seen predominantly in posterior head regions, symmetric and reactive to eye opening and eye closing. Sleep was characterized by vertex waves, sleep spindles (12 to 14 Hz), maximal frontocentral region.  EEG showed 3 to 5 Hz theta-delta slowing in bifrontal region.  Sharp waves were also noted in bifrontal region.  Hyperventilation and photic stimulation were not performed.   ABNORMALITY -Sharp waves, bifrontal -Continuous slow, bifrontal region IMPRESSION: This study showed evidence of epileptogenicity as well as cortical dysfunction arising from bifrontal region likely secondary to underlying meningioma and edema.  No seizures were seen throughout the recording. Lora Havens   CT HEAD CODE STROKE WO CONTRAST  Result Date: 10/22/2019 CLINICAL DATA:  Code stroke. Neuro deficit, acute stroke suspected. Expressive aphasia, confusion. EXAM: CT HEAD WITHOUT CONTRAST TECHNIQUE: Contiguous axial images were obtained from the base of the skull through the vertex without intravenous contrast. COMPARISON:   MRI 10/19/2019 FINDINGS: Brain: Redemonstrated large left frontal meningioma with exuberant surrounding vasogenic edema, not substantially changed when comparing across modalities to MRI from 10/19/2019. Similar mass effect. 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 No evidence of acute large vascular territory infarct. No acute hemorrhage. No hydrocephalus. Vascular: No hyperdense vessel or unexpected calcification. Skull: Postsurgical changes of bifrontal craniotomy. Sinuses/Orbits: Scattered ethmoid air cell mucosal thickening with secretions in the posterior left ethmoid air cell. Additionally, secretions within the left sphenoid sinus. No acute orbital abnormality. Other: No mastoid effusions. ASPECTS [facility] Stroke Program Early CT Score) total score (0-10 with 10 being normal): 10 IMPRESSION: 1. Redemonstrated large left frontal meningioma with exuberant surrounding vasogenic edema, not substantially changed when comparing across modalities to MRI from 10/19/2019. 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 2. No evidence of superimposed acute large vascular territory infarct or acute hemorrhage. ASPECTS is 10 Code stroke imaging results were communicated on 10/22/2019 at 11:56 am to provider Dr. Annice Pih via telephone, who verbally acknowledged these results. Electronically Signed   By: Margaretha Sheffield MD   On: 10/22/2019 12:05    Scheduled Meds: . dexamethasone (DECADRON) injection  4 mg Intravenous Q12H  . enoxaparin (LOVENOX) injection  40 mg Subcutaneous Q24H  . ezetimibe  10 mg Oral Daily  . levETIRAcetam  500 mg Oral BID   Continuous Infusions: . sodium chloride 50 mL/hr at 10/22/19 1600     LOS: 1 day   Time spent: 40 minutes   Little Ishikawa, MD [facility]  10/24/2019, 7:42 AM   To contact the attending provider between 7A-7P or the covering provider during after hours 7P-7A, please log into the web site  [email or web address].

## 2019-10-25 ENCOUNTER — Ambulatory Visit: Payer: Medicare Other | Admitting: Internal Medicine

## 2019-10-25 DIAGNOSIS — D329 Benign neoplasm of meninges, unspecified: Secondary | ICD-10-CM | POA: Diagnosis not present

## 2019-10-25 DIAGNOSIS — E785 Hyperlipidemia, unspecified: Secondary | ICD-10-CM | POA: Diagnosis not present

## 2019-10-25 LAB — RESPIRATORY PANEL BY RT PCR (FLU A&B, COVID)
Influenza A by PCR: NEGATIVE
Influenza B by PCR: NEGATIVE
SARS Coronavirus 2 by RT PCR: NEGATIVE

## 2019-10-25 MED ORDER — LEVETIRACETAM 500 MG PO TABS
500.0000 mg | ORAL_TABLET | Freq: Two times a day (BID) | ORAL | 0 refills | Status: DC
Start: 2019-10-25 — End: 2020-01-31

## 2019-10-25 MED ORDER — DEXAMETHASONE 4 MG PO TABS: 4.0000 mg | ORAL_TABLET | Freq: Two times a day (BID) | ORAL | 0 refills | Status: AC

## 2019-10-25 NOTE — Discharge Summary (Signed)
Physician Discharge Summary  [name] [name] ZJQ:[id number] DOB: [birth year] DOA: 10/22/2019  PCP: Darreld Mclean, MD  Admit date: 10/22/2019 Discharge date: 10/25/2019  Admitted From: Home Disposition: SNF  Recommendations for Outpatient Follow-up:  1. Follow up with PCP in 1-2 weeks 2. Neurosurgery as scheduled   Discharge Condition: Stable CODE STATUS: Full Diet recommendation: As tolerated  Brief/Interim Summary: [name] Voncannonis a 74 y.o.femalewith medical history significant ofIBS; HLD; and meningioma treated with brain radiation in Jan 2021 presenting with AMS.She fell this AM, unsure how long she was down. Her boyfriend called at 57 and she had been down. They broke into the house about 1015 and she was on the floor. There was some confusion which has gotten worse over the course of the morning. She was unable to walk - appeared unable to get them to coordinate them. She talked to her boyfriend 12 hours prior. She did not lose control of bowel/bladder. At baseline, she is cognitively intact. She drove herself to appointments on Friday without difficulty. She was due for a f/u today with neurology re: MRI. She has been on a steroid but has been off recently.  She was last seen by Dr. Mickeal Skinner on 9/10. 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 She then completed radiation therapy with Dr. Isidore Moos. She was having recurrent, stereotypic clinical changes and was treated with high dose decadron x 3 days and then decreased back to 2 mg standing dose. Repeat MRI on 10/15 showed significant interval increase in size of left frontal meningioma, 5.2 x 4.7 x 3.4 cm with increased mass effect and edema in B frontal lobes. In ED:Has growing meningioma, remote resection. Stroke symptoms, found down and confused. CT not acute. Mental status improved. Neurology thinks  maybe seizure. Given Keppra load. Recurrent symptoms in ER. Given Ativan for tonic-clonic seizures, needs LTM.  Patient evaluated by neurosurgery, oncology, neurology continue on Keppra, 4 mg dexamethasone twice daily, planned for outpatient follow-up with neurosurgery and likely recurrent meningioma evaluation and removal in the next 1 to 2 weeks per their schedule.  Patient otherwise stable and agreeable for discharge to facility, after being evaluated by PT OT she was found to be profoundly weak with marked ambulatory dysfunction requiring ongoing physical therapy likely up until surgery.  Hopefully postoperatively her symptoms will markedly improve given location and size of area involved.  Discharge Diagnoses:  Principal Problem:   AMS (altered mental status) Active Problems:   Hyperlipidemia   Meningioma ([place])   Acute metabolic encephalopathy    Discharge Instructions  Discharge Instructions    Diet - low sodium heart healthy   Complete by: As directed    Increase activity slowly   Complete by: As directed      Allergies as of 10/25/2019      Reactions   Bee Venom Swelling, Other (See Comments)   Site of swelling not recalled by family- happened during childhood   Losartan Itching      Medication List    TAKE these medications   acetaminophen 325 MG tablet Commonly known as: TYLENOL Take 325-650 mg by mouth every 6 (six) hours as needed for mild pain or headache.   Calcium 500-100 MG-UNIT Chew Chew 1 tablet by mouth daily.   dexamethasone 4 MG tablet Commonly known as: DECADRON Take 1 tablet (4 mg total) by mouth 2 (two) times daily. What changed:   medication strength  how much to take  when to take this   ezetimibe 10 MG tablet Commonly known as: ZETIA Take 1 tablet (10 mg total) by mouth daily.   HAIR SKIN & NAILS GUMMIES PO Take 1 tablet by mouth daily.   ibuprofen 200 MG tablet Commonly known as: ADVIL Take 200-400 mg by mouth every 6 (six)  hours as needed for headache or mild pain.   levETIRAcetam 500 MG tablet Commonly known as: KEPPRA Take 1 tablet (500 mg total) by mouth 2 (two) times daily.   PRENATAL ADULT GUMMY/DHA/FA PO Take 1 tablet by mouth See admin instructions. Chew 1 gummie by mouth daily       Contact information for after-discharge care    Destination    [facility] .   Service: Skilled Nursing Contact information: [place] [address] [phone]                 Allergies  Allergen Reactions  . Bee Venom Swelling and Other (See Comments)    Site of swelling not recalled by family- happened during childhood  . Losartan Itching    Consultations:  Neurology, neurosurgery, oncology   Procedures/Studies: MR BRAIN W WO CONTRAST  Result Date: 10/19/2019 CLINICAL DATA:  Brain/CNS neoplasm.  Assess treatment response. EXAM: MRI HEAD WITHOUT AND WITH CONTRAST TECHNIQUE: Multiplanar, multiecho pulse sequences of the brain and surrounding structures were obtained without and with intravenous contrast. CONTRAST:  32mL MULTIHANCE GADOBENATE DIMEGLUMINE 529 MG/ML IV SOLN COMPARISON:  MRI of the brain July 14, 2019. FINDINGS: Brain: Postsurgical changes from bifrontal craniotomy. Significant interval increase in size of the dural-based extra-axial left frontal lesion measuring approximately 5.2 x 4.7 x 3.4 cm compared to 3.7 x 3.0 x 2.3 cm on prior. The lesion has peripheral irregular contrast enhancement with necrotic center crosses the midline involving the cerebral falx, superior sagittal sinus in distal branches of the bilateral ACA with significant mass effect and increased edema within the bilateral frontal lobes. No significant change of the extra-axial mass lesion involving the left cavernous sinus, left orbital apex, sella, suprasellar cistern and middle cranial fossa. Lesion measures approximate 3.3 x 3.3 x 2.9 cm compared to 3.3 x 3.3 x 3.0 cm  on prior. There is involvement of the left cavernous sinus, left Meckel's cave, left ICA from the proximal cavernous to the terminus, cisternal segment of the left of topic nerve with mass effect on the optic chiasm. The pituitary stalk deviated to the right. Vascular: Major arterial flow voids are preserved. Skull and upper cervical spine: Normal marrow signal. Sinuses/Orbits: Bilateral lens surgery. Mild mucosal thickening of the ethmoid cells and sphenoid sinuses. Other: None. IMPRESSION: 1. Significant interval increase in size of the left frontal meningioma measuring approximately 5.2 x 4.7 x 3.4 cm compared to 3.7 x 3.0 x 2.3 cm on prior with increased mass effect and edema within the bilateral frontal lobes. 2. No significant change of the known left cavernous meningioma. These results will be called to the ordering clinician or representative by the Radiologist Assistant, and communication documented in the PACS or [employer]. Electronically Signed   By: Pedro Earls M.D.   On: 10/19/2019 14:29   Overnight EEG with video  Result Date: 10/23/2019 Lora Havens, MD     10/24/2019 11:32 AM Patient Name: [name] [name] MRN: [id number] Epilepsy Attending: Lora Havens Referring Physician/Provider: Dr Donnetta Simpers Duration: 10/22/2019 1345 to 10/23/2019 1050 Patient history: 74 year old female with known  large left frontal meningioma and cavernous sinus meningioma with baseline right-sided weakness presented with aphasia.  EEG to evaluate for seizures. Level of alertness: Awake,asleep AEDs during EEG study: LEV Technical aspects: This EEG study was done with scalp electrodes positioned according to the 10-20 International system of electrode placement. Electrical activity was acquired at a sampling rate of 500Hz  and reviewed with a high frequency filter of 70Hz  and a low frequency filter of 1Hz . EEG data were recorded continuously and digitally stored. Description: The  posterior dominant rhythm consists of 9-10 Hz activity of moderate voltage (25-35 uV) seen predominantly in posterior head regions, symmetric and reactive to eye opening and eye closing. Sleep was characterized by vertex waves, sleep spindles (12 to 14 Hz), maximal frontocentral region.  EEG showed 3 to 5 Hz theta-delta slowing in bifrontal region.  Sharp waves were also noted in bifrontal region.  Hyperventilation and photic stimulation were not performed.   ABNORMALITY -Sharp waves, bifrontal -Continuous slow, bifrontal region IMPRESSION: This study showed evidence of epileptogenicity as well as cortical dysfunction arising from bifrontal region likely secondary to underlying meningioma and edema.  No seizures were seen throughout the recording. Lora Havens   CT HEAD CODE STROKE WO CONTRAST  Result Date: 10/22/2019 CLINICAL DATA:  Code stroke. Neuro deficit, acute stroke suspected. Expressive aphasia, confusion. EXAM: CT HEAD WITHOUT CONTRAST TECHNIQUE: Contiguous axial images were obtained from the base of the skull through the vertex without intravenous contrast. COMPARISON:  MRI 10/19/2019 FINDINGS: Brain: Redemonstrated large left frontal meningioma with exuberant surrounding vasogenic edema, not substantially changed when comparing across modalities to MRI from 10/19/2019. Similar mass effect. 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 No evidence of acute large vascular territory infarct. No acute hemorrhage. No hydrocephalus. Vascular: No hyperdense vessel or unexpected calcification. Skull: Postsurgical changes of bifrontal craniotomy. Sinuses/Orbits: Scattered ethmoid air cell mucosal thickening with secretions in the posterior left ethmoid air cell. Additionally, secretions within the left sphenoid sinus. No acute orbital abnormality. Other: No mastoid effusions. ASPECTS [facility] Stroke Program Early CT Score) total score (0-10 with 10 being normal): 10 IMPRESSION: 1.  Redemonstrated large left frontal meningioma with exuberant surrounding vasogenic edema, not substantially changed when comparing across modalities to MRI from 10/19/2019. 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 2. No evidence of superimposed acute large vascular territory infarct or acute hemorrhage. ASPECTS is 10 Code stroke imaging results were communicated on 10/22/2019 at 11:56 am to provider Dr. Annice Pih via telephone, who verbally acknowledged these results. Electronically Signed   By: Margaretha Sheffield MD   On: 10/22/2019 12:05      Subjective: No acute issues or events overnight, denies chest pain, shortness of breath, headache, fevers, chills.   Discharge Exam: Vitals:   10/25/19 0847 10/25/19 1133  BP: (!) 146/91 (!) 164/95  Pulse: 80 77  Resp: 18 18  Temp: 98 F (36.7 C) 98.1 F (36.7 C)  SpO2: 100% 97%   Vitals:   10/24/19 2348 10/25/19 0416 10/25/19 0847 10/25/19 1133  BP: (!) 152/90 (!) 161/86 (!) 146/91 (!) 164/95  Pulse: 73 72 80 77  Resp: 16 17 18 18   Temp: 98.3 F (36.8 C) 97.8 F (36.6 C) 98 F (36.7 C) 98.1 F (36.7 C)  TempSrc: Oral Oral Oral Oral  SpO2: 96% 97% 100% 97%  Weight:      Height:        General: Pt is alert, awake, not in acute distress Cardiovascular: RRR, S1/S2 +,  no rubs, no gallops Respiratory: CTA bilaterally, no wheezing, no rhonchi Abdominal: Soft, NT, ND, bowel sounds + Extremities: no edema, no cyanosis    The results of significant diagnostics from this hospitalization (including imaging, microbiology, ancillary and laboratory) are listed below for reference.     Microbiology: Recent Results (from the past 240 hour(s))  Respiratory Panel by RT PCR (Flu A&B, Covid) - Nasopharyngeal Swab     Status: None   Collection Time: 10/22/19 12:45 PM   Specimen: Nasopharyngeal Swab  Result Value Ref Range Status   SARS Coronavirus 2 by RT PCR NEGATIVE NEGATIVE Final    Comment: (NOTE) SARS-CoV-2 target  nucleic acids are NOT DETECTED.  The SARS-CoV-2 RNA is generally detectable in upper respiratoy specimens during the acute phase of infection. The lowest concentration of SARS-CoV-2 viral copies this assay can detect is 131 copies/mL. A negative result does not preclude SARS-Cov-2 infection and should not be used as the sole basis for treatment or other patient management decisions. A negative result may occur with  improper specimen collection/handling, submission of specimen other than nasopharyngeal swab, presence of viral mutation(s) within the areas targeted by this assay, and inadequate number of viral copies (<131 copies/mL). A negative result must be combined with clinical observations, patient history, and epidemiological information. The expected result is Negative.  Fact Sheet for Patients:  PinkCheek.be  Fact Sheet for Healthcare Providers:  GravelBags.it  This test is no t yet approved or cleared by the [place] FDA and  has been authorized for detection and/or diagnosis of SARS-CoV-2 by FDA under an Emergency Use Authorization (EUA). This EUA will remain  in effect (meaning this test can be used) for the duration of the COVID-19 declaration under Section 564(b)(1) of the Act, 21 U.S.C. section 360bbb-3(b)(1), unless the authorization is terminated or revoked sooner.     Influenza A by PCR NEGATIVE NEGATIVE Final   Influenza B by PCR NEGATIVE NEGATIVE Final    Comment: (NOTE) The Xpert Xpress SARS-CoV-2/FLU/RSV assay is intended as an aid in  the diagnosis of influenza from Nasopharyngeal swab specimens and  should not be used as a sole basis for treatment. Nasal washings and  aspirates are unacceptable for Xpert Xpress SARS-CoV-2/FLU/RSV  testing.  Fact Sheet for Patients: PinkCheek.be  Fact Sheet for Healthcare  Providers: GravelBags.it  This test is not yet approved or cleared by the [place] FDA and  has been authorized for detection and/or diagnosis of SARS-CoV-2 by  FDA under an Emergency Use Authorization (EUA). This EUA will remain  in effect (meaning this test can be used) for the duration of the  Covid-19 declaration under Section 564(b)(1) of the Act, 21  U.S.C. section 360bbb-3(b)(1), unless the authorization is  terminated or revoked. Performed at [facility], [place] [address] [place], [place] [address]   Respiratory Panel by RT PCR (Flu A&B, Covid) - Nasopharyngeal Swab     Status: None   Collection Time: 10/25/19 11:10 AM   Specimen: Nasopharyngeal Swab  Result Value Ref Range Status   SARS Coronavirus 2 by RT PCR NEGATIVE NEGATIVE Final    Comment: (NOTE) SARS-CoV-2 target nucleic acids are NOT DETECTED.  The SARS-CoV-2 RNA is generally detectable in upper respiratoy specimens during the acute phase of infection. The lowest concentration of SARS-CoV-2 viral copies this assay can detect is 131 copies/mL. A negative result does not preclude SARS-Cov-2 infection and should not be used as the sole basis for treatment or other patient management decisions.  A negative result may occur with  improper specimen collection/handling, submission of specimen other than nasopharyngeal swab, presence of viral mutation(s) within the areas targeted by this assay, and inadequate number of viral copies (<131 copies/mL). A negative result must be combined with clinical observations, patient history, and epidemiological information. The expected result is Negative.  Fact Sheet for Patients:  PinkCheek.be  Fact Sheet for Healthcare Providers:  GravelBags.it  This test is no t yet approved or cleared by the [place] FDA and  has been authorized for detection and/or diagnosis of SARS-CoV-2  by FDA under an Emergency Use Authorization (EUA). This EUA will remain  in effect (meaning this test can be used) for the duration of the COVID-19 declaration under Section 564(b)(1) of the Act, 21 U.S.C. section 360bbb-3(b)(1), unless the authorization is terminated or revoked sooner.     Influenza A by PCR NEGATIVE NEGATIVE Final   Influenza B by PCR NEGATIVE NEGATIVE Final    Comment: (NOTE) The Xpert Xpress SARS-CoV-2/FLU/RSV assay is intended as an aid in  the diagnosis of influenza from Nasopharyngeal swab specimens and  should not be used as a sole basis for treatment. Nasal washings and  aspirates are unacceptable for Xpert Xpress SARS-CoV-2/FLU/RSV  testing.  Fact Sheet for Patients: PinkCheek.be  Fact Sheet for Healthcare Providers: GravelBags.it  This test is not yet approved or cleared by the [place] FDA and  has been authorized for detection and/or diagnosis of SARS-CoV-2 by  FDA under an Emergency Use Authorization (EUA). This EUA will remain  in effect (meaning this test can be used) for the duration of the  Covid-19 declaration under Section 564(b)(1) of the Act, 21  U.S.C. section 360bbb-3(b)(1), unless the authorization is  terminated or revoked. Performed at [facility], [place] [address], [place], [place] [address]      Labs: BNP (last 3 results) No results for input(s): BNP in the last 8760 hours. Basic Metabolic Panel: Recent Labs  Lab 10/22/19 1148 10/22/19 1151 10/24/19 0505  NA 138 141 142  K 3.6 3.6 3.7  CL 105 105 109  CO2  --  23 23  GLUCOSE 100* 103* 138*  BUN 18 14 19   CREATININE 0.70 0.84 0.76  CALCIUM  --  9.2 8.9   Liver Function Tests: Recent Labs  Lab 10/22/19 1151  AST 30  ALT 31  ALKPHOS 51  BILITOT 0.9  PROT 6.7  ALBUMIN 3.4*   No results for input(s): LIPASE, AMYLASE in the last 168 hours. No results for input(s): AMMONIA in the last 168  hours. CBC: Recent Labs  Lab 10/22/19 1148 10/22/19 1151 10/24/19 0505  WBC  --  8.1 17.1*  NEUTROABS  --  5.8 15.6*  HGB 13.3 13.7 12.3  HCT 39.0 42.4 36.7  MCV  --  99.5 96.3  PLT  --  230 235   Cardiac Enzymes: No results for input(s): CKTOTAL, CKMB, CKMBINDEX, TROPONINI in the last 168 hours. BNP: Invalid input(s): POCBNP CBG: Recent Labs  Lab 10/22/19 1141  GLUCAP 92   D-Dimer No results for input(s): DDIMER in the last 72 hours. Hgb A1c No results for input(s): HGBA1C in the last 72 hours. Lipid Profile Recent Labs    10/23/19 0318  CHOL 261*  HDL 56  LDLCALC 182*  TRIG 115  CHOLHDL 4.7   Thyroid function studies No results for input(s): TSH, T4TOTAL, T3FREE, THYROIDAB in the last 72 hours.  Invalid input(s): FREET3 Anemia work up No results for  input(s): VITAMINB12, FOLATE, FERRITIN, TIBC, IRON, RETICCTPCT in the last 72 hours. Urinalysis    Component Value Date/Time   COLORURINE YELLOW 10/22/2019 2130   APPEARANCEUR CLEAR 10/22/2019 2130   LABSPEC 1.013 10/22/2019 2130   PHURINE 5.0 10/22/2019 2130   GLUCOSEU 50 (A) 10/22/2019 2130   HGBUR SMALL (A) 10/22/2019 2130   BILIRUBINUR NEGATIVE 10/22/2019 2130   KETONESUR 20 (A) 10/22/2019 2130   PROTEINUR NEGATIVE 10/22/2019 2130   NITRITE NEGATIVE 10/22/2019 2130   LEUKOCYTESUR NEGATIVE 10/22/2019 2130   Sepsis Labs Invalid input(s): PROCALCITONIN,  WBC,  LACTICIDVEN Microbiology Recent Results (from the past 240 hour(s))  Respiratory Panel by RT PCR (Flu A&B, Covid) - Nasopharyngeal Swab     Status: None   Collection Time: 10/22/19 12:45 PM   Specimen: Nasopharyngeal Swab  Result Value Ref Range Status   SARS Coronavirus 2 by RT PCR NEGATIVE NEGATIVE Final    Comment: (NOTE) SARS-CoV-2 target nucleic acids are NOT DETECTED.  The SARS-CoV-2 RNA is generally detectable in upper respiratoy specimens during the acute phase of infection. The lowest concentration of SARS-CoV-2 viral copies this  assay can detect is 131 copies/mL. A negative result does not preclude SARS-Cov-2 infection and should not be used as the sole basis for treatment or other patient management decisions. A negative result may occur with  improper specimen collection/handling, submission of specimen other than nasopharyngeal swab, presence of viral mutation(s) within the areas targeted by this assay, and inadequate number of viral copies (<131 copies/mL). A negative result must be combined with clinical observations, patient history, and epidemiological information. The expected result is Negative.  Fact Sheet for Patients:  PinkCheek.be  Fact Sheet for Healthcare Providers:  GravelBags.it  This test is no t yet approved or cleared by the [place] FDA and  has been authorized for detection and/or diagnosis of SARS-CoV-2 by FDA under an Emergency Use Authorization (EUA). This EUA will remain  in effect (meaning this test can be used) for the duration of the COVID-19 declaration under Section 564(b)(1) of the Act, 21 U.S.C. section 360bbb-3(b)(1), unless the authorization is terminated or revoked sooner.     Influenza A by PCR NEGATIVE NEGATIVE Final   Influenza B by PCR NEGATIVE NEGATIVE Final    Comment: (NOTE) The Xpert Xpress SARS-CoV-2/FLU/RSV assay is intended as an aid in  the diagnosis of influenza from Nasopharyngeal swab specimens and  should not be used as a sole basis for treatment. Nasal washings and  aspirates are unacceptable for Xpert Xpress SARS-CoV-2/FLU/RSV  testing.  Fact Sheet for Patients: PinkCheek.be  Fact Sheet for Healthcare Providers: GravelBags.it  This test is not yet approved or cleared by the [place] FDA and  has been authorized for detection and/or diagnosis of SARS-CoV-2 by  FDA under an Emergency Use Authorization (EUA). This EUA will  remain  in effect (meaning this test can be used) for the duration of the  Covid-19 declaration under Section 564(b)(1) of the Act, 21  U.S.C. section 360bbb-3(b)(1), unless the authorization is  terminated or revoked. Performed at [facility], [place] [address] [place], [place] [address]   Respiratory Panel by RT PCR (Flu A&B, Covid) - Nasopharyngeal Swab     Status: None   Collection Time: 10/25/19 11:10 AM   Specimen: Nasopharyngeal Swab  Result Value Ref Range Status   SARS Coronavirus 2 by RT PCR NEGATIVE NEGATIVE Final    Comment: (NOTE) SARS-CoV-2 target nucleic acids are NOT DETECTED.  The SARS-CoV-2 RNA is  generally detectable in upper respiratoy specimens during the acute phase of infection. The lowest concentration of SARS-CoV-2 viral copies this assay can detect is 131 copies/mL. A negative result does not preclude SARS-Cov-2 infection and should not be used as the sole basis for treatment or other patient management decisions. A negative result may occur with  improper specimen collection/handling, submission of specimen other than nasopharyngeal swab, presence of viral mutation(s) within the areas targeted by this assay, and inadequate number of viral copies (<131 copies/mL). A negative result must be combined with clinical observations, patient history, and epidemiological information. The expected result is Negative.  Fact Sheet for Patients:  PinkCheek.be  Fact Sheet for Healthcare Providers:  GravelBags.it  This test is no t yet approved or cleared by the [place] FDA and  has been authorized for detection and/or diagnosis of SARS-CoV-2 by FDA under an Emergency Use Authorization (EUA). This EUA will remain  in effect (meaning this test can be used) for the duration of the COVID-19 declaration under Section 564(b)(1) of the Act, 21 U.S.C. section 360bbb-3(b)(1), unless the authorization is  terminated or revoked sooner.     Influenza A by PCR NEGATIVE NEGATIVE Final   Influenza B by PCR NEGATIVE NEGATIVE Final    Comment: (NOTE) The Xpert Xpress SARS-CoV-2/FLU/RSV assay is intended as an aid in  the diagnosis of influenza from Nasopharyngeal swab specimens and  should not be used as a sole basis for treatment. Nasal washings and  aspirates are unacceptable for Xpert Xpress SARS-CoV-2/FLU/RSV  testing.  Fact Sheet for Patients: PinkCheek.be  Fact Sheet for Healthcare Providers: GravelBags.it  This test is not yet approved or cleared by the [place] FDA and  has been authorized for detection and/or diagnosis of SARS-CoV-2 by  FDA under an Emergency Use Authorization (EUA). This EUA will remain  in effect (meaning this test can be used) for the duration of the  Covid-19 declaration under Section 564(b)(1) of the Act, 21  U.S.C. section 360bbb-3(b)(1), unless the authorization is  terminated or revoked. Performed at [facility], [place] [address], [place], [place] [address]      Time coordinating discharge: Over 30 minutes  SIGNED:   Little Ishikawa, DO [facility] 10/25/2019, 1:03 PM Pager   If 7PM-7AM, please contact night-coverage [email or web address]

## 2019-10-25 NOTE — TOC Progression Note (Signed)
Transition of Care [facility]) - Progression Note    Patient Details  Name: [name] [name] MRN: [id number] Date of Birth: Sep 15, 1945  [facility]) CM/SW Contact  Pollie Friar, RN Phone Number: 10/25/2019, 10:16 AM  Clinical Narrative:    Pt and significant other have selected [facility] for SNF rehab. CM has updated Dedra at [facility] and they will have a bed for the patient today. Pt has received both covid vaccines Therapist, music).   Auth: [id number] Good 10/20-10/22/21 [place] [phone]   Expected Discharge Plan: Skilled Nursing Facility Barriers to Discharge: Insurance Authorization  Expected Discharge Plan and Services Expected Discharge Plan: [facility] In-house Referral: Clinical Social Work   Post Acute Care Choice: [place] Living arrangements for the past 2 months: Single Family Home                                       Social Determinants of Health (SDOH) Interventions    Readmission Risk Interventions No flowsheet data found.

## 2019-10-25 NOTE — Progress Notes (Signed)
Pt discharged to [facility]. Significant other transported.  She has no questions about discharge.  Has all belongings with her.

## 2019-10-25 NOTE — TOC Transition Note (Signed)
Transition of Care [facility]) - CM/SW Discharge Note   Patient Details  Name: [name] [name] MRN: [id number] Date of Birth: [birth year]  Transition of Care [facility]) CM/SW Contact:  Pollie Friar, RN Phone Number: 10/25/2019, 1:44 PM   Clinical Narrative:    Pt is discharging to [facility] today. CM met with the patient and significant other and they selected [facility]. Significant other to provide transportation to [facility] per pt and his request.  Bedside RN updated and d/c packet at the desk.   Room: 142 Number for report: [phone]   Final next level of care: Skilled Nursing Facility Barriers to Discharge: No Barriers Identified   Patient Goals and CMS Choice Patient states their goals for this hospitalization and ongoing recovery are:: To get rehab CMS Medicare.gov Compare Post Acute Care list provided to:: Patient Represenative (must comment) Choice offered to / list presented to : Patient  Discharge Placement              Patient chooses bed at:  ([facility]) Patient to be transferred to facility by: Fiance Name of family member notified: Fiance Patient and family notified of of transfer: 10/25/19  Discharge Plan and Services In-house Referral: Clinical Social Work   Post Acute Care Choice: [place]                               Social Determinants of Health (SDOH) Interventions     Readmission Risk Interventions No flowsheet data found.

## 2019-10-26 NOTE — Care Management (Signed)
Pt arrived at [facility] and has decided she is not going to stay. Pts significant other is going to take her home. CM has arranged [facility] services through [place]. Cory with [facility] accepted the referral and pt, significant other in agreement.  Pt and sig other came back to hospital and picked up walker for home. This was provided through [employer].  Medications for home sent to pts home pharmacy.

## 2019-10-29 DIAGNOSIS — G9341 Metabolic encephalopathy: Secondary | ICD-10-CM | POA: Diagnosis not present

## 2019-10-29 DIAGNOSIS — W19XXXD Unspecified fall, subsequent encounter: Secondary | ICD-10-CM | POA: Diagnosis not present

## 2019-10-29 DIAGNOSIS — M810 Age-related osteoporosis without current pathological fracture: Secondary | ICD-10-CM | POA: Diagnosis not present

## 2019-10-29 DIAGNOSIS — Z7952 Long term (current) use of systemic steroids: Secondary | ICD-10-CM | POA: Diagnosis not present

## 2019-10-29 DIAGNOSIS — Z9181 History of falling: Secondary | ICD-10-CM | POA: Diagnosis not present

## 2019-10-29 DIAGNOSIS — I10 Essential (primary) hypertension: Secondary | ICD-10-CM | POA: Diagnosis not present

## 2019-10-29 DIAGNOSIS — Z923 Personal history of irradiation: Secondary | ICD-10-CM | POA: Diagnosis not present

## 2019-10-29 DIAGNOSIS — D320 Benign neoplasm of cerebral meninges: Secondary | ICD-10-CM | POA: Diagnosis not present

## 2019-10-29 DIAGNOSIS — K589 Irritable bowel syndrome without diarrhea: Secondary | ICD-10-CM | POA: Diagnosis not present

## 2019-10-29 DIAGNOSIS — E785 Hyperlipidemia, unspecified: Secondary | ICD-10-CM | POA: Diagnosis not present

## 2019-10-30 ENCOUNTER — Other Ambulatory Visit: Payer: Self-pay | Admitting: Neurosurgery

## 2019-10-30 DIAGNOSIS — D329 Benign neoplasm of meninges, unspecified: Secondary | ICD-10-CM | POA: Diagnosis not present

## 2019-10-30 DIAGNOSIS — R030 Elevated blood-pressure reading, without diagnosis of hypertension: Secondary | ICD-10-CM | POA: Diagnosis not present

## 2019-11-01 ENCOUNTER — Other Ambulatory Visit (HOSPITAL_COMMUNITY): Payer: Self-pay | Admitting: Neurosurgery

## 2019-11-01 ENCOUNTER — Other Ambulatory Visit: Payer: Self-pay | Admitting: Radiation Therapy

## 2019-11-02 ENCOUNTER — Other Ambulatory Visit: Payer: Self-pay | Admitting: Neurosurgery

## 2019-11-02 ENCOUNTER — Other Ambulatory Visit (HOSPITAL_COMMUNITY): Payer: Self-pay | Admitting: Neurosurgery

## 2019-11-02 DIAGNOSIS — D329 Benign neoplasm of meninges, unspecified: Secondary | ICD-10-CM

## 2019-11-03 ENCOUNTER — Ambulatory Visit (HOSPITAL_COMMUNITY)
Admission: RE | Admit: 2019-11-03 | Discharge: 2019-11-03 | Disposition: A | Discharge status: Home / Self Care | Payer: Medicare Other | Source: Ambulatory Visit | Attending: Neurosurgery | Admitting: Neurosurgery

## 2019-11-03 ENCOUNTER — Other Ambulatory Visit: Payer: Self-pay

## 2019-11-03 DIAGNOSIS — I6529 Occlusion and stenosis of unspecified carotid artery: Secondary | ICD-10-CM | POA: Diagnosis not present

## 2019-11-03 DIAGNOSIS — G936 Cerebral edema: Secondary | ICD-10-CM | POA: Diagnosis not present

## 2019-11-03 DIAGNOSIS — D329 Benign neoplasm of meninges, unspecified: Secondary | ICD-10-CM | POA: Insufficient documentation

## 2019-11-03 DIAGNOSIS — G9389 Other specified disorders of brain: Secondary | ICD-10-CM | POA: Diagnosis not present

## 2019-11-03 MED ORDER — GADOBUTROL 1 MMOL/ML IV SOLN
4.0000 mL | Freq: Once | INTRAVENOUS | Status: AC | PRN
  Administered 2019-11-03: 4 mL via INTRAVENOUS

## 2019-11-05 NOTE — Pre-Procedure Instructions (Signed)
[id number] - [place], [place] - [address] [place] [address] Phone: [phone] Fax: [phone]    Your procedure is scheduled on Fri., Nov. 5, 2021 from 9:30AM-1:01PM  Report to [facility] Entrance "A" at 7:30AM  Call this number if you have problems the morning of surgery:  [phone]   Remember:  Do not eat or drink after midnight on Nov. 4th     Take these medicines the morning of surgery with A SIP OF WATER: Dexamethasone (DECADRON)     Ezetimibe (ZETIA)  LevETIRAcetam (KEPPRA)      If Needed: Acetaminophen (TYLENOL)  As of today, STOP taking all Aspirin (unless instructed by your doctor) and Other Aspirin containing products, Vitamins, Fish oils, and Herbal medications. Also stop all NSAIDS i.e. Advil, Ibuprofen, Motrin, Aleve, Anaprox, Naproxen, BC, Goody Powders, and all Supplements.   No Smoking of any kind, Tobacco/Vaping, or Alcohol products 24 hours prior to your procedure. If you use a Cpap at night, you may bring all equipment for your overnight stay.   Special instructions:  [place] [place]- Preparing For Surgery  Before surgery, you can play an important role. Because skin is not sterile, your skin needs to be as free of germs as possible. You can reduce the number of germs on your skin by washing with CHG (chlorahexidine gluconate) Soap before surgery.  CHG is an antiseptic cleaner which kills germs and bonds with the skin to continue killing germs even after washing.    Please do not use if you have an allergy to CHG or antibacterial soaps. If your skin becomes reddened/irritated stop using the CHG.  Do not shave (including legs and underarms) for at least 48 hours prior to first CHG shower. It is OK to shave your face.  Please follow these instructions carefully.   1. Shower the NIGHT BEFORE SURGERY and the MORNING OF SURGERY with CHG.   2. If you chose to wash  your hair, wash your hair first as usual with your normal shampoo.  3. After you shampoo, rinse your hair and body thoroughly to remove the shampoo.  4. Use CHG as you would any other liquid soap. You can apply CHG directly to the skin and wash gently with a scrungie or a clean washcloth.   5. Apply the CHG Soap to your body ONLY FROM THE NECK DOWN.  Do not use on open wounds or open sores. Avoid contact with your eyes, ears, mouth and genitals (private parts). Wash Face and genitals (private parts)  with your normal soap.  6. Wash thoroughly, paying special attention to the area where your surgery will be performed.  7. Thoroughly rinse your body with warm water from the neck down.  8. DO NOT shower/wash with your normal soap after using and rinsing off the CHG Soap.  9. Pat yourself dry with a CLEAN TOWEL.  10. Wear CLEAN PAJAMAS to bed the night before surgery, wear comfortable clothes the morning of surgery  11. Place CLEAN SHEETS on your bed the night of your first shower and DO NOT SLEEP WITH PETS.   Day of Surgery:             Remember to brush your teeth WITH YOUR REGULAR TOOTHPASTE.  Do not wear jewelry, make-up or nail polish.  Do not wear lotions, powders, or perfumes, or deodorant.  Do not shave 48 hours prior to surgery.  Do not bring valuables to the hospital.  [facility] is not responsible for any belongings or valuables.  Contacts, dentures or bridgework may not be worn into surgery.    For patients admitted to the hospital, discharge time will be determined by your treatment team.  Patients discharged the day of surgery will not be allowed to drive home, and someone age 72 and over needs to stay with them for 24 hours.  Please wear clean clothes to the hospital/surgery center.    Please read over the following fact sheets that you were given.

## 2019-11-06 ENCOUNTER — Inpatient Hospital Stay (HOSPITAL_COMMUNITY)
Admission: RE | Admit: 2019-11-06 | Discharge: 2019-11-06 | Disposition: A | Discharge status: Home / Self Care | Payer: Medicare Other | Source: Ambulatory Visit

## 2019-11-06 ENCOUNTER — Other Ambulatory Visit: Payer: Self-pay

## 2019-11-06 ENCOUNTER — Other Ambulatory Visit (HOSPITAL_COMMUNITY)
Admission: RE | Admit: 2019-11-06 | Discharge: 2019-11-06 | Disposition: A | Discharge status: Home / Self Care | Payer: Medicare Other | Source: Ambulatory Visit | Attending: Neurosurgery | Admitting: Neurosurgery

## 2019-11-06 ENCOUNTER — Encounter (HOSPITAL_COMMUNITY): Payer: Self-pay

## 2019-11-06 DIAGNOSIS — R062 Wheezing: Secondary | ICD-10-CM | POA: Diagnosis not present

## 2019-11-06 DIAGNOSIS — D320 Benign neoplasm of cerebral meninges: Secondary | ICD-10-CM | POA: Diagnosis not present

## 2019-11-06 DIAGNOSIS — Z888 Allergy status to other drugs, medicaments and biological substances status: Secondary | ICD-10-CM | POA: Diagnosis not present

## 2019-11-06 DIAGNOSIS — R4182 Altered mental status, unspecified: Secondary | ICD-10-CM | POA: Diagnosis not present

## 2019-11-06 DIAGNOSIS — Z8249 Family history of ischemic heart disease and other diseases of the circulatory system: Secondary | ICD-10-CM | POA: Diagnosis not present

## 2019-11-06 DIAGNOSIS — Z79899 Other long term (current) drug therapy: Secondary | ICD-10-CM | POA: Diagnosis not present

## 2019-11-06 DIAGNOSIS — D332 Benign neoplasm of brain, unspecified: Secondary | ICD-10-CM | POA: Diagnosis not present

## 2019-11-06 DIAGNOSIS — I82890 Acute embolism and thrombosis of other specified veins: Secondary | ICD-10-CM | POA: Diagnosis not present

## 2019-11-06 DIAGNOSIS — I10 Essential (primary) hypertension: Secondary | ICD-10-CM | POA: Diagnosis not present

## 2019-11-06 DIAGNOSIS — E785 Hyperlipidemia, unspecified: Secondary | ICD-10-CM | POA: Diagnosis not present

## 2019-11-06 DIAGNOSIS — G9341 Metabolic encephalopathy: Secondary | ICD-10-CM | POA: Diagnosis not present

## 2019-11-06 DIAGNOSIS — Z85828 Personal history of other malignant neoplasm of skin: Secondary | ICD-10-CM | POA: Diagnosis not present

## 2019-11-06 DIAGNOSIS — I5033 Acute on chronic diastolic (congestive) heart failure: Secondary | ICD-10-CM | POA: Diagnosis not present

## 2019-11-06 DIAGNOSIS — G08 Intracranial and intraspinal phlebitis and thrombophlebitis: Secondary | ICD-10-CM | POA: Diagnosis not present

## 2019-11-06 DIAGNOSIS — G936 Cerebral edema: Secondary | ICD-10-CM | POA: Diagnosis not present

## 2019-11-06 DIAGNOSIS — G40909 Epilepsy, unspecified, not intractable, without status epilepticus: Secondary | ICD-10-CM | POA: Diagnosis not present

## 2019-11-06 DIAGNOSIS — Z800 Family history of malignant neoplasm of digestive organs: Secondary | ICD-10-CM | POA: Diagnosis not present

## 2019-11-06 DIAGNOSIS — Z923 Personal history of irradiation: Secondary | ICD-10-CM | POA: Diagnosis not present

## 2019-11-06 DIAGNOSIS — G9389 Other specified disorders of brain: Secondary | ICD-10-CM | POA: Diagnosis not present

## 2019-11-06 DIAGNOSIS — Z91030 Bee allergy status: Secondary | ICD-10-CM | POA: Diagnosis not present

## 2019-11-06 DIAGNOSIS — Z01812 Encounter for preprocedural laboratory examination: Secondary | ICD-10-CM | POA: Insufficient documentation

## 2019-11-06 DIAGNOSIS — Z803 Family history of malignant neoplasm of breast: Secondary | ICD-10-CM | POA: Diagnosis not present

## 2019-11-06 DIAGNOSIS — M810 Age-related osteoporosis without current pathological fracture: Secondary | ICD-10-CM | POA: Diagnosis not present

## 2019-11-06 DIAGNOSIS — Z20822 Contact with and (suspected) exposure to covid-19: Secondary | ICD-10-CM | POA: Diagnosis not present

## 2019-11-06 DIAGNOSIS — G939 Disorder of brain, unspecified: Secondary | ICD-10-CM | POA: Diagnosis not present

## 2019-11-06 DIAGNOSIS — K219 Gastro-esophageal reflux disease without esophagitis: Secondary | ICD-10-CM | POA: Diagnosis not present

## 2019-11-06 HISTORY — DX: Shortness of breath: R06.02

## 2019-11-06 HISTORY — DX: Essential (primary) hypertension: I10

## 2019-11-06 LAB — SARS CORONAVIRUS 2 (TAT 6-24 HRS): SARS Coronavirus 2: NEGATIVE

## 2019-11-06 NOTE — Progress Notes (Signed)
---  Pre-op phone call--- Patient missed scheduled PAT appointment today.  PCP - Dr. Janett Billow [place] Cardiologist - denies  PPM/ICD - denies  Chest x-ray - N/A EKG - N/A  Stress Test - 10/19/16 ECHO - 06/23/2018 Cardiac Cath - denies  Sleep Study - denies CPAP - N/A  DM: denies  Blood Thinner Instructions: N/A Aspirin Instructions: N/A  ERAS Protcol - No orders  COVID TEST- Test results pending 11/06/2019. Patient verbalized understanding of self-quarantine instructions.   Anesthesia review: YES, recent ED visit  Patient denies shortness of breath, fever, cough and chest pain on pre-op phone call.  All instructions explained to the patient, with a verbal understanding of the material. Patient also instructed to self quarantine after being tested for COVID-19. The opportunity to ask questions was provided.    Your procedure is scheduled on Friday, November 5th.  Report to [facility] Main Entrance "A" at 7:00 A.M., and check in at the Admitting office.  Call this number if you have problems the morning of surgery:  [phone]  Call [phone] if you have any questions prior to your surgery date Monday-Friday 8am-4pm   Remember:  Do not eat or drink after midnight the night before your surgery    Take these medicines the morning of surgery with A SIP OF WATER  dexamethasone (DECADRON) ezetimibe (ZETIA) levETIRAcetam (KEPPRA)  If needed: Tylenol  As of today, STOP taking any Aspirin (unless otherwise instructed by your surgeon) Aleve, Naproxen, Ibuprofen, Motrin, Advil, Goody's, BC's, all herbal medications, fish oil, and all vitamins.                     Do not wear jewelry, make up, or nail polish            Do not wear lotions, powders, perfumes, or deodorant.            Do not shave 48 hours prior to surgery.                       Do not bring valuables to the hospital.            [facility] is not responsible for any belongings or valuables.  Do NOT Smoke  (Tobacco/Vaping) or drink Alcohol 24 hours prior to your procedure If you use a CPAP at night, you may bring all equipment for your overnight stay.   Contacts, glasses, dentures or bridgework may not be worn into surgery.      For patients admitted to the hospital, discharge time will be determined by your treatment team.   Patients discharged the day of surgery will not be allowed to drive home, and someone needs to stay with them for 24 hours.  Day of Surgery: Shower Wear Clean/Comfortable clothing the morning of surgery Do not apply any deodorants/lotions.   Remember to brush your teeth WITH YOUR REGULAR TOOTHPASTE.   Please read over the following fact sheets that you were given.

## 2019-11-07 NOTE — Anesthesia Preprocedure Evaluation (Addendum)
Anesthesia Evaluation  Patient identified by MRN, date of birth, ID band Patient awake    Reviewed: Allergy & Precautions, NPO status , Patient's Chart, lab work & pertinent test results  History of Anesthesia Complications (+) PONV and history of anesthetic complications  Airway Mallampati: IV  TM Distance: <3 FB Neck ROM: Full    Dental  (+) Teeth Intact, Dental Advisory Given   Pulmonary neg pulmonary ROS,    breath sounds clear to auscultation       Cardiovascular hypertension,  Rhythm:Regular Rate:Normal     Neuro/Psych negative psych ROS   GI/Hepatic Neg liver ROS, GERD  ,  Endo/Other  negative endocrine ROS  Renal/GU negative Renal ROS     Musculoskeletal negative musculoskeletal ROS (+)   Abdominal Normal abdominal exam  (+)   Peds  Hematology negative hematology ROS (+)   Anesthesia Other Findings   Reproductive/Obstetrics                          Anesthesia Physical Anesthesia Plan  ASA: III  Anesthesia Plan: General   Post-op Pain Management:    Induction: Intravenous  PONV Risk Score and Plan: 4 or greater and Ondansetron, Dexamethasone and Treatment may vary due to age or medical condition  Airway Management Planned: Oral ETT  Additional Equipment: Arterial line  Intra-op Plan:   Post-operative Plan: Extubation in OR  Informed Consent: I have reviewed the patients History and Physical, chart, labs and discussed the procedure including the risks, benefits and alternatives for the proposed anesthesia with the patient or authorized representative who has indicated his/her understanding and acceptance.     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  She then completed radiation  therapy with Dr. Isidore Moos.  She was admitted to the [facility] on 10/22/2019 with AMS and possible seizure.  Per discharge summary 10/25/2019, "She was having recurrent, stereotypic clinical changes and was treated with high dose decadron x 3 days and then decreased back to 2 mg standing dose. Repeat MRI on 10/15 showed significant interval increase in size of left frontal meningioma, 5.2 x 4.7 x 3.4 cm with increased mass effect and edema in B frontal lobes. In ED:Has growing meningioma, remote resection. Stroke symptoms, found down and confused. CT not acute. Mental status improved. Neurology thinks maybe seizure. Given Keppra load. Recurrent symptoms in ER. Given Ativan for tonic-clonic seizures, needs LTM.Marland KitchenMarland KitchenPatient evaluated by neurosurgery, oncology, neurology continue on Keppra, 4 mg dexamethasone twice daily, planned for outpatient follow-up with neurosurgery and likely recurrent meningioma evaluation and removal in the next 1 to 2 weeks per their schedule."   BMP and CBC from 10/24/2019 reviewed. WBC elevated at 17.1 (possibly influenced by increased steroid dose), otherwise unremarkable.  EKG 10/23/2019: Sinus rhythm. Rate 97. Borderline T wave abnormalities.  TTE 06/23/2018: 1. The left ventricle has normal systolic function with an ejection  fraction of 60-65%. The cavity size was normal. Left ventricular diastolic  Doppler parameters are consistent with impaired relaxation.  2. The right ventricle has normal systolic function. The cavity was  normal.  3. Trivial pericardial effusion is present.  4. The mitral valve is grossly normal.  5. The aortic valve is tricuspid. Mild thickening of the aortic valve.  Aortic valve regurgitation is trivial by color flow Doppler. No stenosis  of the aortic valve.  6. Normal LV function; mild diastolic dysfunction; trace AI.   Exercise tolerance test 10/19/2016: Blood pressure demonstrated a hypertensive response to exercise. There was  no ST segment deviation noted during stress.   1. Hypertensive BP response.  2. Below average exercise capacity.  3. No evidence for ischemia by ST segment analysis.  )      Anesthesia Quick Evaluation

## 2019-11-07 NOTE — Progress Notes (Signed)
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  She then completed radiation therapy with Dr. Isidore Moos.  She was admitted to the [facility] on 10/22/2019 with AMS and possible seizure.  Per discharge summary 10/25/2019, "She was having recurrent, stereotypic clinical changes and was treated with high dose decadron x 3 days and then decreased back to 2 mg standing dose. Repeat MRI on 10/15 showed significant interval increase in size of left frontal meningioma, 5.2 x 4.7 x 3.4 cm with increased mass effect and edema in B frontal lobes. In ED:Has growing meningioma, remote resection. Stroke symptoms, found down and confused. CT not acute. Mental status improved. Neurology thinks maybe seizure. Given Keppra load. Recurrent symptoms in ER. Given Ativan for tonic-clonic seizures, needs LTM.Marland KitchenMarland KitchenPatient evaluated by neurosurgery, oncology, neurology continue on Keppra, 4 mg dexamethasone twice daily, planned for outpatient follow-up with neurosurgery and likely recurrent meningioma evaluation and removal in the next 1 to 2 weeks per their schedule."   BMP and CBC from 10/24/2019 reviewed. WBC elevated at 17.1 (possibly influenced by increased steroid dose), otherwise unremarkable.  EKG 10/23/2019: Sinus rhythm. Rate 97. Borderline T wave abnormalities.  TTE 06/23/2018: 1. The left ventricle has normal systolic function with an ejection  fraction of 60-65%. The cavity size was normal. Left ventricular diastolic  Doppler parameters are consistent with impaired relaxation.  2. The right ventricle has normal systolic function. The cavity was  normal.  3. Trivial pericardial effusion is present.  4. The mitral valve is grossly normal.  5. The aortic valve is tricuspid. Mild thickening of the aortic valve.  Aortic valve regurgitation is trivial by color flow Doppler. No stenosis   of the aortic valve.  6. Normal LV function; mild diastolic dysfunction; trace AI.   Exercise tolerance test 10/19/2016:  Blood pressure demonstrated a hypertensive response to exercise.  There was no ST segment deviation noted during stress.   1. Hypertensive BP response.  2. Below average exercise capacity.  3. No evidence for ischemia by ST segment analysis.    Wynonia Musty [facility] Short Stay Center/Anesthesiology Phone [phone] 11/07/2019 10:02 AM

## 2019-11-09 ENCOUNTER — Inpatient Hospital Stay (HOSPITAL_COMMUNITY): Payer: Medicare Other | Admitting: Certified Registered Nurse Anesthetist

## 2019-11-09 ENCOUNTER — Encounter (HOSPITAL_COMMUNITY)
Admission: RE | Disposition: A | Discharge status: Home Health | Payer: Self-pay | Source: Home / Self Care | Attending: Neurosurgery

## 2019-11-09 ENCOUNTER — Inpatient Hospital Stay (HOSPITAL_COMMUNITY)
Admission: RE | Admit: 2019-11-09 | Discharge: 2019-11-13 | DRG: 027 | Disposition: A | Discharge status: Home Health | Payer: Medicare Other | Attending: Neurosurgery | Admitting: Neurosurgery

## 2019-11-09 ENCOUNTER — Other Ambulatory Visit: Payer: Self-pay

## 2019-11-09 ENCOUNTER — Inpatient Hospital Stay (HOSPITAL_COMMUNITY): Payer: Medicare Other | Admitting: Physician Assistant

## 2019-11-09 ENCOUNTER — Encounter (HOSPITAL_COMMUNITY): Payer: Self-pay | Admitting: Neurosurgery

## 2019-11-09 DIAGNOSIS — D332 Benign neoplasm of brain, unspecified: Secondary | ICD-10-CM | POA: Diagnosis present

## 2019-11-09 DIAGNOSIS — K219 Gastro-esophageal reflux disease without esophagitis: Secondary | ICD-10-CM | POA: Diagnosis present

## 2019-11-09 DIAGNOSIS — M810 Age-related osteoporosis without current pathological fracture: Secondary | ICD-10-CM | POA: Diagnosis present

## 2019-11-09 DIAGNOSIS — Z888 Allergy status to other drugs, medicaments and biological substances status: Secondary | ICD-10-CM

## 2019-11-09 DIAGNOSIS — Z91030 Bee allergy status: Secondary | ICD-10-CM | POA: Diagnosis not present

## 2019-11-09 DIAGNOSIS — Z8249 Family history of ischemic heart disease and other diseases of the circulatory system: Secondary | ICD-10-CM | POA: Diagnosis not present

## 2019-11-09 DIAGNOSIS — Z20822 Contact with and (suspected) exposure to covid-19: Secondary | ICD-10-CM | POA: Diagnosis present

## 2019-11-09 DIAGNOSIS — I10 Essential (primary) hypertension: Secondary | ICD-10-CM | POA: Diagnosis present

## 2019-11-09 DIAGNOSIS — G40909 Epilepsy, unspecified, not intractable, without status epilepticus: Secondary | ICD-10-CM | POA: Diagnosis present

## 2019-11-09 DIAGNOSIS — D320 Benign neoplasm of cerebral meninges: Principal | ICD-10-CM | POA: Diagnosis present

## 2019-11-09 DIAGNOSIS — Z85828 Personal history of other malignant neoplasm of skin: Secondary | ICD-10-CM

## 2019-11-09 DIAGNOSIS — Z800 Family history of malignant neoplasm of digestive organs: Secondary | ICD-10-CM

## 2019-11-09 DIAGNOSIS — Z79899 Other long term (current) drug therapy: Secondary | ICD-10-CM | POA: Diagnosis not present

## 2019-11-09 DIAGNOSIS — G939 Disorder of brain, unspecified: Secondary | ICD-10-CM | POA: Diagnosis not present

## 2019-11-09 DIAGNOSIS — Z803 Family history of malignant neoplasm of breast: Secondary | ICD-10-CM

## 2019-11-09 DIAGNOSIS — E785 Hyperlipidemia, unspecified: Secondary | ICD-10-CM | POA: Diagnosis present

## 2019-11-09 DIAGNOSIS — Z923 Personal history of irradiation: Secondary | ICD-10-CM

## 2019-11-09 HISTORY — PX: APPLICATION OF CRANIAL NAVIGATION: SHX6578

## 2019-11-09 HISTORY — PX: CRANIOTOMY: SHX93

## 2019-11-09 LAB — POCT I-STAT 7, (LYTES, BLD GAS, ICA,H+H)
Acid-base deficit: 1 mmol/L (ref 0.0–2.0)
Acid-base deficit: 1 mmol/L (ref 0.0–2.0)
Bicarbonate: 22.4 mmol/L (ref 20.0–28.0)
Bicarbonate: 23.1 mmol/L (ref 20.0–28.0)
Calcium, Ion: 1.13 mmol/L — ABNORMAL LOW (ref 1.15–1.40)
Calcium, Ion: 1.12 mmol/L — ABNORMAL LOW (ref 1.15–1.40)
HCT: 33 % — ABNORMAL LOW (ref 36.0–46.0)
HCT: 32 % — ABNORMAL LOW (ref 36.0–46.0)
Hemoglobin: 11.2 g/dL — ABNORMAL LOW (ref 12.0–15.0)
Hemoglobin: 10.9 g/dL — ABNORMAL LOW (ref 12.0–15.0)
O2 Saturation: 94 %
O2 Saturation: 99 %
Patient temperature: 35
Patient temperature: 35.2
Potassium: 3.7 mmol/L (ref 3.5–5.1)
Potassium: 3.9 mmol/L (ref 3.5–5.1)
Sodium: 141 mmol/L (ref 135–145)
Sodium: 141 mmol/L (ref 135–145)
TCO2: 23 mmol/L (ref 22–32)
TCO2: 24 mmol/L (ref 22–32)
pCO2 arterial: 28.6 mmHg — ABNORMAL LOW (ref 32.0–48.0)
pCO2 arterial: 31.6 mmHg — ABNORMAL LOW (ref 32.0–48.0)
pH, Arterial: 7.493 — ABNORMAL HIGH (ref 7.350–7.450)
pH, Arterial: 7.464 — ABNORMAL HIGH (ref 7.350–7.450)
pO2, Arterial: 57 mmHg — ABNORMAL LOW (ref 83.0–108.0)
pO2, Arterial: 124 mmHg — ABNORMAL HIGH (ref 83.0–108.0)

## 2019-11-09 LAB — CBC
HCT: 39.6 % (ref 36.0–46.0)
HCT: 34.7 % — ABNORMAL LOW (ref 36.0–46.0)
Hemoglobin: 13.2 g/dL (ref 12.0–15.0)
Hemoglobin: 11.5 g/dL — ABNORMAL LOW (ref 12.0–15.0)
MCH: 32 pg (ref 26.0–34.0)
MCH: 32 pg (ref 26.0–34.0)
MCHC: 33.3 g/dL (ref 30.0–36.0)
MCHC: 33.1 g/dL (ref 30.0–36.0)
MCV: 95.9 fL (ref 80.0–100.0)
MCV: 96.7 fL (ref 80.0–100.0)
Platelets: 249 10*3/uL (ref 150–400)
Platelets: 192 10*3/uL (ref 150–400)
RBC: 4.13 MIL/uL (ref 3.87–5.11)
RBC: 3.59 MIL/uL — ABNORMAL LOW (ref 3.87–5.11)
RDW: 12.6 % (ref 11.5–15.5)
RDW: 12.7 % (ref 11.5–15.5)
WBC: 14.1 10*3/uL — ABNORMAL HIGH (ref 4.0–10.5)
WBC: 25 10*3/uL — ABNORMAL HIGH (ref 4.0–10.5)
nRBC: 0 % (ref 0.0–0.2)
nRBC: 0 % (ref 0.0–0.2)

## 2019-11-09 LAB — BASIC METABOLIC PANEL
Anion gap: 12 (ref 5–15)
Anion gap: 12 (ref 5–15)
BUN: 21 mg/dL (ref 8–23)
BUN: 19 mg/dL (ref 8–23)
CO2: 23 mmol/L (ref 22–32)
CO2: 19 mmol/L — ABNORMAL LOW (ref 22–32)
Calcium: 8.6 mg/dL — ABNORMAL LOW (ref 8.9–10.3)
Calcium: 7.4 mg/dL — ABNORMAL LOW (ref 8.9–10.3)
Chloride: 100 mmol/L (ref 98–111)
Chloride: 108 mmol/L (ref 98–111)
Creatinine, Ser: 0.78 mg/dL (ref 0.44–1.00)
Creatinine, Ser: 0.87 mg/dL (ref 0.44–1.00)
GFR, Estimated: >60 mL/min (ref >60)
GFR, Estimated: >60 mL/min (ref >60)
Glucose, Bld: 113 mg/dL — ABNORMAL HIGH (ref 70–99)
Glucose, Bld: 184 mg/dL — ABNORMAL HIGH (ref 70–99)
Potassium: 3.9 mmol/L (ref 3.5–5.1)
Potassium: 4 mmol/L (ref 3.5–5.1)
Sodium: 135 mmol/L (ref 135–145)
Sodium: 139 mmol/L (ref 135–145)

## 2019-11-09 LAB — MRSA PCR SCREENING: MRSA by PCR: NEGATIVE

## 2019-11-09 LAB — TYPE AND SCREEN
ABO/RH(D): O NEG
Antibody Screen: NEGATIVE

## 2019-11-09 SURGERY — CRANIOTOMY TUMOR EXCISION
Anesthesia: General | Laterality: Bilateral

## 2019-11-09 MED ORDER — ETOMIDATE 2 MG/ML IV SOLN
INTRAVENOUS | Status: AC
  Filled 2019-11-09: qty 10

## 2019-11-09 MED ORDER — SODIUM CHLORIDE 0.9 % IV SOLN: INTRAVENOUS | Status: DC | PRN

## 2019-11-09 MED ORDER — THROMBIN 5000 UNITS EX SOLR
OROMUCOSAL | Status: DC | PRN
  Administered 2019-11-09: 5 mL via TOPICAL

## 2019-11-09 MED ORDER — CHLORHEXIDINE GLUCONATE CLOTH 2 % EX PADS: 6.0000 | MEDICATED_PAD | Freq: Once | CUTANEOUS | Status: DC

## 2019-11-09 MED ORDER — EPHEDRINE 5 MG/ML INJ
INTRAVENOUS | Status: AC
  Filled 2019-11-09: qty 10

## 2019-11-09 MED ORDER — LIDOCAINE-EPINEPHRINE 1 %-1:100000 IJ SOLN
INTRAMUSCULAR | Status: DC | PRN
  Administered 2019-11-09: 8 mL via INTRADERMAL

## 2019-11-09 MED ORDER — EPHEDRINE SULFATE-NACL 50-0.9 MG/10ML-% IV SOSY
PREFILLED_SYRINGE | INTRAVENOUS | Status: DC | PRN
  Administered 2019-11-09 (×2): 10 mg via INTRAVENOUS

## 2019-11-09 MED ORDER — BISACODYL 5 MG PO TBEC: 5.0000 mg | DELAYED_RELEASE_TABLET | Freq: Every day | ORAL | Status: DC | PRN

## 2019-11-09 MED ORDER — 0.9 % SODIUM CHLORIDE (POUR BTL) OPTIME
TOPICAL | Status: DC | PRN
  Administered 2019-11-09: 1000 mL

## 2019-11-09 MED ORDER — CHLORHEXIDINE GLUCONATE CLOTH 2 % EX PADS
6.0000 | MEDICATED_PAD | Freq: Every day | CUTANEOUS | Status: DC
  Administered 2019-11-09 – 2019-11-13 (×4): 6 via TOPICAL

## 2019-11-09 MED ORDER — LACTATED RINGERS IV SOLN: INTRAVENOUS | Status: DC

## 2019-11-09 MED ORDER — BACITRACIN ZINC 500 UNIT/GM EX OINT
TOPICAL_OINTMENT | CUTANEOUS | Status: AC
  Filled 2019-11-09: qty 28.35

## 2019-11-09 MED ORDER — PROPOFOL 10 MG/ML IV BOLUS
INTRAVENOUS | Status: AC
  Filled 2019-11-09: qty 20

## 2019-11-09 MED ORDER — DEXAMETHASONE SODIUM PHOSPHATE 4 MG/ML IJ SOLN
4.0000 mg | Freq: Three times a day (TID) | INTRAMUSCULAR | Status: DC
  Administered 2019-11-11 – 2019-11-13 (×6): 4 mg via INTRAVENOUS
  Filled 2019-11-09 (×7): qty 1

## 2019-11-09 MED ORDER — MEPERIDINE HCL 25 MG/ML IJ SOLN: 6.2500 mg | INTRAMUSCULAR | Status: DC | PRN

## 2019-11-09 MED ORDER — CEFAZOLIN SODIUM-DEXTROSE 2-4 GM/100ML-% IV SOLN
2.0000 g | Freq: Three times a day (TID) | INTRAVENOUS | Status: AC
  Administered 2019-11-09 – 2019-11-10 (×2): 2 g via INTRAVENOUS
  Filled 2019-11-09 (×2): qty 100

## 2019-11-09 MED ORDER — CHLORHEXIDINE GLUCONATE 0.12 % MT SOLN: 15.0000 mL | Freq: Once | OROMUCOSAL | Status: AC

## 2019-11-09 MED ORDER — LIDOCAINE 2% (20 MG/ML) 5 ML SYRINGE
INTRAMUSCULAR | Status: DC | PRN
  Administered 2019-11-09: 100 mg via INTRAVENOUS

## 2019-11-09 MED ORDER — LEVETIRACETAM IN NACL 500 MG/100ML IV SOLN
500.0000 mg | Freq: Two times a day (BID) | INTRAVENOUS | Status: DC
  Administered 2019-11-09 – 2019-11-13 (×8): 500 mg via INTRAVENOUS
  Filled 2019-11-09 (×8): qty 100

## 2019-11-09 MED ORDER — ACETAMINOPHEN 160 MG/5ML PO SOLN: 325.0000 mg | Freq: Once | ORAL | Status: DC | PRN

## 2019-11-09 MED ORDER — CEFAZOLIN SODIUM-DEXTROSE 2-4 GM/100ML-% IV SOLN
2.0000 g | INTRAVENOUS | Status: AC
  Administered 2019-11-09 (×2): 2 g via INTRAVENOUS
  Filled 2019-11-09: qty 100

## 2019-11-09 MED ORDER — BACITRACIN ZINC 500 UNIT/GM EX OINT
TOPICAL_OINTMENT | CUTANEOUS | Status: DC | PRN
  Administered 2019-11-09: 1 via TOPICAL

## 2019-11-09 MED ORDER — DEXAMETHASONE SODIUM PHOSPHATE 10 MG/ML IJ SOLN
10.0000 mg | Freq: Four times a day (QID) | INTRAMUSCULAR | Status: AC
  Administered 2019-11-09 – 2019-11-10 (×4): 10 mg via INTRAVENOUS
  Filled 2019-11-09 (×4): qty 1

## 2019-11-09 MED ORDER — LEVETIRACETAM 500 MG PO TABS: 500.0000 mg | ORAL_TABLET | Freq: Two times a day (BID) | ORAL | Status: DC

## 2019-11-09 MED ORDER — LIDOCAINE 2% (20 MG/ML) 5 ML SYRINGE
INTRAMUSCULAR | Status: AC
  Filled 2019-11-09: qty 5

## 2019-11-09 MED ORDER — DEXAMETHASONE SODIUM PHOSPHATE 10 MG/ML IJ SOLN
6.0000 mg | Freq: Four times a day (QID) | INTRAMUSCULAR | Status: AC
  Administered 2019-11-10 – 2019-11-11 (×4): 6 mg via INTRAVENOUS
  Filled 2019-11-09 (×4): qty 0.6

## 2019-11-09 MED ORDER — ONDANSETRON HCL 4 MG PO TABS: 4.0000 mg | ORAL_TABLET | ORAL | Status: DC | PRN

## 2019-11-09 MED ORDER — MIDAZOLAM HCL 2 MG/2ML IJ SOLN
INTRAMUSCULAR | Status: DC | PRN
  Administered 2019-11-09: 2 mg via INTRAVENOUS

## 2019-11-09 MED ORDER — ROCURONIUM BROMIDE 10 MG/ML (PF) SYRINGE
PREFILLED_SYRINGE | INTRAVENOUS | Status: DC | PRN
  Administered 2019-11-09 (×2): 50 mg via INTRAVENOUS

## 2019-11-09 MED ORDER — PHENYLEPHRINE 40 MCG/ML (10ML) SYRINGE FOR IV PUSH (FOR BLOOD PRESSURE SUPPORT)
PREFILLED_SYRINGE | INTRAVENOUS | Status: DC | PRN
  Administered 2019-11-09: 40 ug via INTRAVENOUS
  Administered 2019-11-09 (×2): 120 ug via INTRAVENOUS

## 2019-11-09 MED ORDER — SENNOSIDES-DOCUSATE SODIUM 8.6-50 MG PO TABS: 1.0000 | ORAL_TABLET | Freq: Every evening | ORAL | Status: DC | PRN

## 2019-11-09 MED ORDER — CEFAZOLIN SODIUM 1 G IJ SOLR
INTRAMUSCULAR | Status: AC
  Filled 2019-11-09: qty 20

## 2019-11-09 MED ORDER — ACETAMINOPHEN 325 MG PO TABS: 325.0000 mg | ORAL_TABLET | Freq: Once | ORAL | Status: DC | PRN

## 2019-11-09 MED ORDER — CHLORHEXIDINE GLUCONATE 0.12 % MT SOLN
OROMUCOSAL | Status: AC
  Administered 2019-11-09: 15 mL via OROMUCOSAL
  Filled 2019-11-09: qty 15

## 2019-11-09 MED ORDER — THROMBIN 5000 UNITS EX SOLR
CUTANEOUS | Status: AC
  Filled 2019-11-09: qty 5000

## 2019-11-09 MED ORDER — LABETALOL HCL 5 MG/ML IV SOLN
10.0000 mg | INTRAVENOUS | Status: DC | PRN
  Administered 2019-11-09: 20 mg via INTRAVENOUS
  Administered 2019-11-09 (×2): 10 mg via INTRAVENOUS
  Administered 2019-11-10 (×3): 20 mg via INTRAVENOUS
  Filled 2019-11-09 (×2): qty 4
  Filled 2019-11-09: qty 8
  Filled 2019-11-09: qty 4

## 2019-11-09 MED ORDER — HEMOSTATIC AGENTS (NO CHARGE) OPTIME
TOPICAL | Status: DC | PRN
  Administered 2019-11-09: 1 via TOPICAL

## 2019-11-09 MED ORDER — ACETAMINOPHEN 10 MG/ML IV SOLN: 1000.0000 mg | Freq: Once | INTRAVENOUS | Status: DC | PRN

## 2019-11-09 MED ORDER — SODIUM CHLORIDE 0.9 % IR SOLN
Status: DC | PRN
  Administered 2019-11-09: 3000 mL

## 2019-11-09 MED ORDER — ONDANSETRON HCL 4 MG/2ML IJ SOLN
INTRAMUSCULAR | Status: AC
  Filled 2019-11-09: qty 2

## 2019-11-09 MED ORDER — LEVETIRACETAM IN NACL 500 MG/100ML IV SOLN
500.0000 mg | Freq: Once | INTRAVENOUS | Status: DC
  Filled 2019-11-09: qty 100

## 2019-11-09 MED ORDER — DEXAMETHASONE SODIUM PHOSPHATE 10 MG/ML IJ SOLN
INTRAMUSCULAR | Status: AC
  Filled 2019-11-09: qty 1

## 2019-11-09 MED ORDER — ACETAMINOPHEN 325 MG PO TABS: 325.0000 mg | ORAL_TABLET | Freq: Four times a day (QID) | ORAL | Status: DC | PRN

## 2019-11-09 MED ORDER — FENTANYL CITRATE (PF) 250 MCG/5ML IJ SOLN
INTRAMUSCULAR | Status: DC | PRN
  Administered 2019-11-09: 50 ug via INTRAVENOUS

## 2019-11-09 MED ORDER — LABETALOL HCL 5 MG/ML IV SOLN
INTRAVENOUS | Status: AC
  Administered 2019-11-09: 10 mg via INTRAVENOUS
  Filled 2019-11-09: qty 4

## 2019-11-09 MED ORDER — POTASSIUM CHLORIDE IN NACL 20-0.9 MEQ/L-% IV SOLN
INTRAVENOUS | Status: DC
  Filled 2019-11-09 (×6): qty 1000

## 2019-11-09 MED ORDER — SODIUM CHLORIDE 0.9 % IV SOLN
0.0500 ug/kg/min | INTRAVENOUS | Status: DC
  Administered 2019-11-09: .25 ug/kg/min via INTRAVENOUS
  Filled 2019-11-09 (×3): qty 5000

## 2019-11-09 MED ORDER — AMISULPRIDE (ANTIEMETIC) 5 MG/2ML IV SOLN: 10.0000 mg | Freq: Once | INTRAVENOUS | Status: DC | PRN

## 2019-11-09 MED ORDER — DOCUSATE SODIUM 100 MG PO CAPS
100.0000 mg | ORAL_CAPSULE | Freq: Two times a day (BID) | ORAL | Status: DC
  Administered 2019-11-09 – 2019-11-13 (×8): 100 mg via ORAL
  Filled 2019-11-09 (×8): qty 1

## 2019-11-09 MED ORDER — FENTANYL CITRATE (PF) 100 MCG/2ML IJ SOLN
25.0000 ug | INTRAMUSCULAR | Status: DC | PRN
  Administered 2019-11-09: 50 ug via INTRAVENOUS

## 2019-11-09 MED ORDER — ROCURONIUM BROMIDE 10 MG/ML (PF) SYRINGE
PREFILLED_SYRINGE | INTRAVENOUS | Status: AC
  Filled 2019-11-09: qty 10

## 2019-11-09 MED ORDER — PANTOPRAZOLE SODIUM 40 MG IV SOLR
40.0000 mg | Freq: Every day | INTRAVENOUS | Status: DC
  Administered 2019-11-09 – 2019-11-11 (×3): 40 mg via INTRAVENOUS
  Filled 2019-11-09 (×3): qty 40

## 2019-11-09 MED ORDER — FENTANYL CITRATE (PF) 250 MCG/5ML IJ SOLN
INTRAMUSCULAR | Status: AC
  Filled 2019-11-09: qty 5

## 2019-11-09 MED ORDER — THROMBIN 20000 UNITS EX SOLR
CUTANEOUS | Status: AC
  Filled 2019-11-09: qty 20000

## 2019-11-09 MED ORDER — SUGAMMADEX SODIUM 200 MG/2ML IV SOLN
INTRAVENOUS | Status: DC | PRN
  Administered 2019-11-09: 200 mg via INTRAVENOUS

## 2019-11-09 MED ORDER — MICROFIBRILLAR COLL HEMOSTAT EX POWD
CUTANEOUS | Status: AC
  Filled 2019-11-09: qty 5

## 2019-11-09 MED ORDER — PRENATAL MULTIVITAMIN CH
1.0000 | ORAL_TABLET | Freq: Every day | ORAL | Status: DC
  Filled 2019-11-09: qty 1

## 2019-11-09 MED ORDER — PROMETHAZINE HCL 25 MG PO TABS: 12.5000 mg | ORAL_TABLET | ORAL | Status: DC | PRN

## 2019-11-09 MED ORDER — PHENYLEPHRINE HCL-NACL 10-0.9 MG/250ML-% IV SOLN
INTRAVENOUS | Status: DC | PRN
  Administered 2019-11-09: 25 ug/min via INTRAVENOUS

## 2019-11-09 MED ORDER — PHENYLEPHRINE 40 MCG/ML (10ML) SYRINGE FOR IV PUSH (FOR BLOOD PRESSURE SUPPORT)
PREFILLED_SYRINGE | INTRAVENOUS | Status: AC
  Filled 2019-11-09: qty 10

## 2019-11-09 MED ORDER — DEXAMETHASONE SODIUM PHOSPHATE 10 MG/ML IJ SOLN
INTRAMUSCULAR | Status: DC | PRN
  Administered 2019-11-09: 10 mg via INTRAVENOUS

## 2019-11-09 MED ORDER — ONDANSETRON HCL 4 MG/2ML IJ SOLN
INTRAMUSCULAR | Status: DC | PRN
  Administered 2019-11-09: 4 mg via INTRAVENOUS

## 2019-11-09 MED ORDER — DEXAMETHASONE SODIUM PHOSPHATE 10 MG/ML IJ SOLN: 10.0000 mg | Freq: Once | INTRAMUSCULAR | Status: DC

## 2019-11-09 MED ORDER — THROMBIN 20000 UNITS EX SOLR
CUTANEOUS | Status: DC | PRN
  Administered 2019-11-09: 10 mL via TOPICAL

## 2019-11-09 MED ORDER — SODIUM CHLORIDE 0.9 % IV SOLN
INTRAVENOUS | Status: DC | PRN
  Administered 2019-11-09: 500 mg via INTRAVENOUS

## 2019-11-09 MED ORDER — EZETIMIBE 10 MG PO TABS
10.0000 mg | ORAL_TABLET | Freq: Every day | ORAL | Status: DC
  Administered 2019-11-10 – 2019-11-13 (×4): 10 mg via ORAL
  Filled 2019-11-09 (×4): qty 1

## 2019-11-09 MED ORDER — PRENATAL ADULT GUMMY/DHA/FA 0.4-25 MG PO CHEW
CHEWABLE_TABLET | Freq: Every day | ORAL | Status: DC
Start: 2019-11-09 — End: 2019-11-09

## 2019-11-09 MED ORDER — LIDOCAINE-EPINEPHRINE 1 %-1:100000 IJ SOLN
INTRAMUSCULAR | Status: AC
  Filled 2019-11-09: qty 1

## 2019-11-09 MED ORDER — ONDANSETRON HCL 4 MG/2ML IJ SOLN: 4.0000 mg | INTRAMUSCULAR | Status: DC | PRN

## 2019-11-09 MED ORDER — ORAL CARE MOUTH RINSE: 15.0000 mL | Freq: Once | OROMUCOSAL | Status: AC

## 2019-11-09 MED ORDER — FENTANYL CITRATE (PF) 100 MCG/2ML IJ SOLN
INTRAMUSCULAR | Status: AC
  Filled 2019-11-09: qty 2

## 2019-11-09 MED ORDER — MIDAZOLAM HCL 2 MG/2ML IJ SOLN
INTRAMUSCULAR | Status: AC
  Filled 2019-11-09: qty 2

## 2019-11-09 MED ORDER — PROPOFOL 10 MG/ML IV BOLUS
INTRAVENOUS | Status: DC | PRN
  Administered 2019-11-09: 140 mg via INTRAVENOUS

## 2019-11-09 SURGICAL SUPPLY — 81 items
ADH SKN CLS APL DERMABOND .7 (GAUZE/BANDAGES/DRESSINGS) ×1
BAND INSRT 18 STRL LF DISP RB (MISCELLANEOUS)
BAND RUBBER #18 3X1/16 STRL (MISCELLANEOUS) IMPLANT
BLADE CLIPPER SURG (BLADE) ×2 IMPLANT
BLADE SURG 11 STRL SS (BLADE) ×2 IMPLANT
BNDG CMPR 75X41 PLY HI ABS (GAUZE/BANDAGES/DRESSINGS) ×2
BNDG COHESIVE 4X5 TAN STRL (GAUZE/BANDAGES/DRESSINGS) ×2 IMPLANT
BNDG GAUZE ELAST 4 BULKY (GAUZE/BANDAGES/DRESSINGS) ×2 IMPLANT
BNDG STRETCH 4X75 STRL LF (GAUZE/BANDAGES/DRESSINGS) ×4 IMPLANT
BUR ACORN 9.0 PRECISION (BURR) ×2 IMPLANT
BUR SPIRAL ROUTER 2.3 (BUR) IMPLANT
CANISTER SUCT 3000ML PPV (MISCELLANEOUS) ×4 IMPLANT
CARTRIDGE OIL MAESTRO DRILL (MISCELLANEOUS) ×1 IMPLANT
CATH FOLEY 2WAY  3CC 10FR (CATHETERS) ×2
CATH FOLEY 2WAY 3CC 10FR (CATHETERS) ×1 IMPLANT
CLIP VESOCCLUDE MED 6/CT (CLIP) ×2 IMPLANT
CNTNR URN SCR LID CUP LEK RST (MISCELLANEOUS) ×2 IMPLANT
CONT SPEC 4OZ STRL OR WHT (MISCELLANEOUS) ×4
COVER BURR HOLE UNIV 10 (Orthopedic Implant) ×4 IMPLANT
COVER WAND RF STERILE (DRAPES) ×2 IMPLANT
DECANTER SPIKE VIAL GLASS SM (MISCELLANEOUS) ×2 IMPLANT
DERMABOND ADVANCED (GAUZE/BANDAGES/DRESSINGS) ×1
DERMABOND ADVANCED .7 DNX12 (GAUZE/BANDAGES/DRESSINGS) ×1 IMPLANT
DIFFUSER DRILL AIR PNEUMATIC (MISCELLANEOUS) ×2 IMPLANT
DRAPE MICROSCOPE LEICA (MISCELLANEOUS) IMPLANT
DRAPE NEUROLOGICAL W/INCISE (DRAPES) ×2 IMPLANT
DRAPE STERI IOBAN 125X83 (DRAPES) IMPLANT
DRAPE WARM FLUID 44X44 (DRAPES) ×2 IMPLANT
DRSG OPSITE 4X5.5 SM (GAUZE/BANDAGES/DRESSINGS) ×6 IMPLANT
ELECT REM PT RETURN 9FT ADLT (ELECTROSURGICAL) ×2
ELECTRODE REM PT RTRN 9FT ADLT (ELECTROSURGICAL) ×1 IMPLANT
FORCEPS BIPOLAR SPETZLER 8 1.0 (NEUROSURGERY SUPPLIES) IMPLANT
GAUZE 4X4 16PLY RFD (DISPOSABLE) IMPLANT
GAUZE SPONGE 4X4 12PLY STRL (GAUZE/BANDAGES/DRESSINGS) ×4 IMPLANT
GLOVE BIO SURGEON STRL SZ 6.5 (GLOVE) IMPLANT
GLOVE BIO SURGEON STRL SZ7 (GLOVE) ×2 IMPLANT
GLOVE BIO SURGEON STRL SZ7.5 (GLOVE) ×2 IMPLANT
GLOVE BIO SURGEON STRL SZ8 (GLOVE) ×2 IMPLANT
GLOVE BIOGEL PI IND STRL 7.0 (GLOVE) ×2 IMPLANT
GLOVE BIOGEL PI INDICATOR 7.0 (GLOVE) ×2
GLOVE INDICATOR 6.5 STRL GRN (GLOVE) IMPLANT
GLOVE INDICATOR 8.5 STRL (GLOVE) ×2 IMPLANT
GOWN STRL REUS W/ TWL LRG LVL3 (GOWN DISPOSABLE) ×1 IMPLANT
GOWN STRL REUS W/ TWL XL LVL3 (GOWN DISPOSABLE) ×1 IMPLANT
GOWN STRL REUS W/TWL 2XL LVL3 (GOWN DISPOSABLE) ×2 IMPLANT
GOWN STRL REUS W/TWL LRG LVL3 (GOWN DISPOSABLE) ×2
GOWN STRL REUS W/TWL XL LVL3 (GOWN DISPOSABLE) ×2
GRAFT DURAGEN MATRIX 2WX2L ×2 IMPLANT
GRAFT DURAGEN MATRIX 3WX3L (Graft) ×2 IMPLANT
GRAFT DURAGEN MATRIX 3X3 SNGL (Graft) ×1 IMPLANT
HEMOSTAT POWDER KIT SURGIFOAM (HEMOSTASIS) IMPLANT
HEMOSTAT SURGICEL 2X14 (HEMOSTASIS) IMPLANT
KIT BASIN OR (CUSTOM PROCEDURE TRAY) ×2 IMPLANT
KIT TURNOVER KIT B (KITS) ×2 IMPLANT
MARKER SKIN DUAL TIP RULER LAB (MISCELLANEOUS) ×2 IMPLANT
MARKER SPHERE PSV REFLC 13MM (MARKER) ×4 IMPLANT
NEEDLE HYPO 25X1 1.5 SAFETY (NEEDLE) ×2 IMPLANT
NS IRRIG 1000ML POUR BTL (IV SOLUTION) ×4 IMPLANT
OIL CARTRIDGE MAESTRO DRILL (MISCELLANEOUS) ×2
PACK CRANIOTOMY CUSTOM (CUSTOM PROCEDURE TRAY) ×2 IMPLANT
PAD ARMBOARD 7.5X6 YLW CONV (MISCELLANEOUS) ×6 IMPLANT
PATTIES SURGICAL .25X.25 (GAUZE/BANDAGES/DRESSINGS) IMPLANT
PATTIES SURGICAL .5 X.5 (GAUZE/BANDAGES/DRESSINGS) IMPLANT
PATTIES SURGICAL .5 X3 (DISPOSABLE) ×6 IMPLANT
PATTIES SURGICAL 1X1 (DISPOSABLE) ×2 IMPLANT
PLATE CRANIAL 20 W/TAB F/SHN (Plate) ×2 IMPLANT
PLATE CRANIAL SHUNT 14 (Plate) ×2 IMPLANT
SCREW UNIII AXS SD 1.5X4 (Screw) ×40 IMPLANT
SPONGE NEURO XRAY DETECT 1X3 (DISPOSABLE) ×4 IMPLANT
SPONGE SURGIFOAM ABS GEL 100 (HEMOSTASIS) IMPLANT
STAPLER VISISTAT 35W (STAPLE) ×2 IMPLANT
SUT ETHILON 3 0 FSL (SUTURE) ×4 IMPLANT
SUT NURALON 4 0 TR CR/8 (SUTURE) ×6 IMPLANT
SUT VIC AB 2-0 CT1 18 (SUTURE) ×4 IMPLANT
TOWEL GREEN STERILE (TOWEL DISPOSABLE) ×2 IMPLANT
TOWEL GREEN STERILE FF (TOWEL DISPOSABLE) IMPLANT
TRAY FOLEY MTR SLVR 16FR STAT (SET/KITS/TRAYS/PACK) ×2 IMPLANT
TRAY FOLEY W/BAG SLVR 14FR (SET/KITS/TRAYS/PACK) ×2 IMPLANT
TUBE CONNECTING 12X1/4 (SUCTIONS) ×2 IMPLANT
UNDERPAD 30X36 HEAVY ABSORB (UNDERPADS AND DIAPERS) ×2 IMPLANT
WATER STERILE IRR 1000ML POUR (IV SOLUTION) ×2 IMPLANT

## 2019-11-09 NOTE — Transfer of Care (Signed)
Immediate Anesthesia Transfer of Care Note  Patient: [name] [name]  Procedure(s) Performed: Craniotomy - bilateral - Frontal stereotactic (Bilateral ) APPLICATION OF CRANIAL NAVIGATION (Bilateral )  Patient Location: PACU  Anesthesia Type:General  Level of Consciousness: drowsy and patient cooperative  Airway & Oxygen Therapy: Patient Spontanous Breathing and Patient connected to face mask oxygen  Post-op Assessment: Report given to RN and Post -op Vital signs reviewed and stable  Post vital signs: Reviewed and stable  Last Vitals:  Vitals Value Taken Time  BP 159/80 11/09/19 1439  Temp    Pulse 97 11/09/19 1446  Resp 16 11/09/19 1446  SpO2 99 % 11/09/19 1446  Vitals shown include unvalidated device data.  Last Pain:  Vitals:   11/09/19 0745  TempSrc:   PainSc: 0-No pain         Complications: No complications documented.

## 2019-11-09 NOTE — H&P (Signed)
[name] [name] is an 74 y.o. female.   Chief Complaint: Headache seizures recurrent parasagittal meningioma HPI: 74 year old female with a history of parasagittal meningioma resected 2 years ago patient started developing early recurrence radiation treatment was attempted however tumor continue to grow despite this got to her was causing significant mass-effect patient presented to ER with a seizure was stabilized procedure and presents now for reresection.  I have extensively gone over the risks and benefits of a redo bicoronal craniotomy for resection of recurrent meningioma with the patient and her husband as well as perioperative course expectations of outcome and alternatives of surgery and she understands and agrees to proceed forward.  Past Medical History:  Diagnosis Date  . Cancer ([place])    skin cancer   . History of radiation therapy 01/09/19- 01/19/19   SRS brain radiation. 5 fractions to total 25 Gy  . Hyperlipidemia   . Hypertension   . IBS (irritable bowel syndrome)   . Osteoporosis   . PONV (postoperative nausea and vomiting)    with breast surgery  . SOBOE (shortness of breath on exertion)     Past Surgical History:  Procedure Laterality Date  . APPLICATION OF CRANIAL NAVIGATION N/A 09/07/2017   Procedure: APPLICATION OF CRANIAL NAVIGATION;  Surgeon: Kary Kos, MD;  Location: [facility];  Service: Neurosurgery;  Laterality: N/A;  . BRAIN SURGERY  09/2017  . BUNIONECTOMY    . COLONOSCOPY     [facility] Dr Amedeo Plenty probably about 10 years ago  . CRANIOTOMY N/A 09/07/2017   Procedure: CRAINIOTOMY BI CORONAL w/BRAINLAB;  Surgeon: Kary Kos, MD;  Location: [facility];  Service: Neurosurgery;  Laterality: N/A;  . EYE SURGERY Bilateral    cataract surgery  . SKIN CANCER EXCISION    . WISDOM TOOTH EXTRACTION      Family History  Problem Relation Age of Onset  . Breast cancer Mother   . Heart disease Father   . Colon cancer Maternal Aunt    Social History:  reports that she has never  smoked. She has never used smokeless tobacco. She reports that she does not drink alcohol and does not use drugs.  Allergies:  Allergies  Allergen Reactions  . Bee Venom Swelling and Other (See Comments)    Site of swelling not recalled by family- happened during childhood  . Losartan Itching    Medications Prior to Admission  Medication Sig Dispense Refill  . dexamethasone (DECADRON) 4 MG tablet Take 1 tablet (4 mg total) by mouth 2 (two) times daily. 60 tablet 0  . ezetimibe (ZETIA) 10 MG tablet Take 1 tablet (10 mg total) by mouth daily. 90 tablet 1  . levETIRAcetam (KEPPRA) 500 MG tablet Take 1 tablet (500 mg total) by mouth 2 (two) times daily. 60 tablet 0  . acetaminophen (TYLENOL) 325 MG tablet Take 325-650 mg by mouth every 6 (six) hours as needed for mild pain or headache.    . Biotin w/ Vitamins C & E (HAIR SKIN & NAILS GUMMIES PO) Take 1 tablet by mouth daily. (Patient not taking: Reported on 10/31/2019)    . Calcium 500-100 MG-UNIT CHEW Chew 1 tablet by mouth daily. (Patient not taking: Reported on 10/31/2019)    . ibuprofen (ADVIL) 200 MG tablet Take 200-400 mg by mouth every 6 (six) hours as needed for headache or mild pain. (Patient not taking: Reported on 10/31/2019)    . Prenatal MV & Min w/FA-DHA (PRENATAL ADULT GUMMY/DHA/FA PO) Take 1 tablet by mouth daily.  Results for orders placed or performed during the hospital encounter of 11/09/19 (from the past 48 hour(s))  Type and screen [place]     Status: None   Collection Time: 11/09/19  7:50 AM  Result Value Ref Range   ABO/RH(D) O NEG    Antibody Screen NEG    Sample Expiration      11/12/2019,2359 Performed at [facility], [place] [address] [place], [place] [address]   Basic metabolic panel per protocol     Status: Abnormal   Collection Time: 11/09/19  8:08 AM  Result Value Ref Range   Sodium 135 135 - 145 mmol/L   Potassium 3.9 3.5 - 5.1 mmol/L   Chloride 100 98 - 111 mmol/L    CO2 23 22 - 32 mmol/L   Glucose, Bld 113 (H) 70 - 99 mg/dL    Comment: Glucose reference range applies only to samples taken after fasting for at least 8 hours.   BUN 21 8 - 23 mg/dL   Creatinine, Ser 0.78 0.44 - 1.00 mg/dL   Calcium 8.6 (L) 8.9 - 10.3 mg/dL   GFR, Estimated >60 >60 mL/min    Comment: (NOTE) Calculated using the CKD-EPI Creatinine Equation (2021)    Anion gap 12 5 - 15    Comment: Performed at [facility] [address] [place], [place] [address]  CBC per protocol     Status: Abnormal   Collection Time: 11/09/19  8:08 AM  Result Value Ref Range   WBC 14.1 (H) 4.0 - 10.5 K/uL   RBC 4.13 3.87 - 5.11 MIL/uL   Hemoglobin 13.2 12.0 - 15.0 g/dL   HCT 39.6 36 - 46 %   MCV 95.9 80.0 - 100.0 fL   MCH 32.0 26.0 - 34.0 pg   MCHC 33.3 30.0 - 36.0 g/dL   RDW 12.6 11.5 - 15.5 %   Platelets 249 150 - 400 K/uL   nRBC 0.0 0.0 - 0.2 %    Comment: Performed at [facility], [place] [address] [place], [place] [address]   No results found.  Review of Systems  Neurological: Positive for seizures and headaches.    Blood pressure (!) 168/97, pulse 72, temperature 97.9 F (36.6 C), temperature source Temporal, resp. rate 18, height 5\' 2"  (1.575 m), weight 72.6 kg, SpO2 96 %. Physical Exam HENT:     Head: Normocephalic.     Right Ear: Tympanic membrane normal.     Nose: Nose normal.     Mouth/Throat:     Mouth: Mucous membranes are moist.  Eyes:     Pupils: Pupils are equal, round, and reactive to light.  Cardiovascular:     Rate and Rhythm: Normal rate.  Pulmonary:     Effort: Pulmonary effort is normal.  Abdominal:     General: Abdomen is flat.  Musculoskeletal:     Cervical back: Normal range of motion.  Skin:    General: Skin is warm.  Neurological:     General: No focal deficit present.     Mental Status: She is alert.     Comments: Patient is awake alert oriented pupils are equal extract movements are intact strength is 5/5 upper and lower  extremities.      Assessment/Plan 74 year old female presents for reresection of recurrent parasagittal and frontal meningioma  Elaina Hoops, MD 11/09/2019, 9:05 AM

## 2019-11-09 NOTE — Anesthesia Procedure Notes (Signed)
Procedure Name: Intubation Date/Time: 11/09/2019 10:46 AM Performed by: Kathryne Hitch, CRNA Pre-anesthesia Checklist: Patient identified, Emergency Drugs available, Suction available and Patient being monitored Patient Re-evaluated:Patient Re-evaluated prior to induction Oxygen Delivery Method: Circle system utilized Preoxygenation: Pre-oxygenation with 100% oxygen Induction Type: IV induction Ventilation: Mask ventilation without difficulty and Oral airway inserted - appropriate to patient size Laryngoscope Size: Mac and 4 Grade View: Grade II Tube type: Oral Tube size: 7.0 mm Number of attempts: 3 Airway Equipment and Method: Stylet and Oral airway Placement Confirmation: ETT inserted through vocal cords under direct vision,  positive ETCO2 and breath sounds checked- equal and bilateral Secured at: 21 cm Tube secured with: Tape Dental Injury: Teeth and Oropharynx as per pre-operative assessment  Comments: CRNA DVL x2.

## 2019-11-09 NOTE — Anesthesia Postprocedure Evaluation (Signed)
Anesthesia Post Note  Patient: Alyana Kreiter  Procedure(s) Performed: Craniotomy - bilateral - Frontal stereotactic (Bilateral ) APPLICATION OF CRANIAL NAVIGATION (Bilateral )     Patient location during evaluation: PACU Anesthesia Type: General Level of consciousness: awake and alert Pain management: pain level controlled Vital Signs Assessment: post-procedure vital signs reviewed and stable Respiratory status: spontaneous breathing, nonlabored ventilation, respiratory function stable and patient connected to nasal cannula oxygen Cardiovascular status: blood pressure returned to baseline and stable Postop Assessment: no apparent nausea or vomiting Anesthetic complications: no   No complications documented.  Last Vitals:  Vitals:   11/09/19 1645 11/09/19 1700  BP: 139/80 136/84  Pulse: 63 67  Resp: 11 19  Temp:  36.5 C  SpO2: 97% 97%    Last Pain:  Vitals:   11/09/19 1700  TempSrc:   PainSc: 0-No pain                 Effie Berkshire

## 2019-11-09 NOTE — Op Note (Signed)
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  Due to patient's progression of clinical syndrome imaging findings and failed conservative treatment I recommended reresection of recurrent parasagittal left frontal meningioma I extensively know the risks and benefits of the operation with her as well as perioperative course expectations of outcome and alternatives of surgery and she understood and agreed to proceed forward.  Operative procedure: Patient was brought into the OR was due to general anesthesia positioned supine the neck in slight flexion facing straightforward then locked in place in the [place] pins then BrainLab was brought in the field and the stereotactic system was registered and then her head was shaved prepped and draped in routine sterile fashion her old bicoronal incision was opened up scar tissue was dissected free and the scalp was reflected anteriorly identifying the bone flap and previous plates.  Previous plates were then removed I had to redrilled bur holes and redo the craniotomy along the previous defect and removed the bone flap and immediately underneath was a very thickened dural substitute attached to the meningioma.  Then sequentially working in a 360 degree orientation and progressively around the tumor I developed the peel  plane from the frontal cortex around the capsule of meningioma.  And progressively working around that 306 degrees I did dissect moderate size vein off the posterior right side of the tumor that possibly that could have been Labbe but this was kept intact and dissected posteriorly off the tumor identified the silk suture used to divide the sinuses during the first operation and so cut the dura just anterior to this and then with careful microdissection on the undersurface of the tumor able to remove it all en bloc.  There was no residual tumor along the tumor bed and I did not visualize any tumor along the undersurface of the skull flap.  I also did not visualize any tumor just anterior to the sinus where we have taken out but that might be the one area where there may be some tumor cells remaining.  This was coagulated.  I then laid Surgicel along the resection bed used DuraGen as a dural substitute overlaid upon overlaid across the defect there was some sinus bleeding coming from the posterior aspect the sinus but this was easily packed with Gelfoam.  We then reattached the craniotomy flap with the Lorenz plating system.  I did do some contouring of the frontal boss to smooth out the transition for the bone flap.  We then reapproximated the bicoronal scalp incision with interrupted Vicryls and a running nylon.  At the end the case all needle count sponge counts were correct.

## 2019-11-10 ENCOUNTER — Inpatient Hospital Stay (HOSPITAL_COMMUNITY): Payer: Medicare Other

## 2019-11-10 LAB — BASIC METABOLIC PANEL
Anion gap: 10 (ref 5–15)
BUN: 15 mg/dL (ref 8–23)
CO2: 21 mmol/L — ABNORMAL LOW (ref 22–32)
Calcium: 8 mg/dL — ABNORMAL LOW (ref 8.9–10.3)
Chloride: 107 mmol/L (ref 98–111)
Creatinine, Ser: 0.87 mg/dL (ref 0.44–1.00)
GFR, Estimated: >60 mL/min (ref >60)
Glucose, Bld: 151 mg/dL — ABNORMAL HIGH (ref 70–99)
Potassium: 4.4 mmol/L (ref 3.5–5.1)
Sodium: 138 mmol/L (ref 135–145)

## 2019-11-10 LAB — HEMOGLOBIN A1C
Hgb A1c MFr Bld: 6.1 % — ABNORMAL HIGH (ref 4.8–5.6)
Mean Plasma Glucose: 128.37 mg/dL

## 2019-11-10 LAB — GLUCOSE, CAPILLARY
Glucose-Capillary: 148 mg/dL — ABNORMAL HIGH (ref 70–99)
Glucose-Capillary: 201 mg/dL — ABNORMAL HIGH (ref 70–99)
Glucose-Capillary: 168 mg/dL — ABNORMAL HIGH (ref 70–99)

## 2019-11-10 MED ORDER — ADULT MULTIVITAMIN W/MINERALS CH
1.0000 | ORAL_TABLET | Freq: Every day | ORAL | Status: DC
  Administered 2019-11-10 – 2019-11-13 (×4): 1 via ORAL
  Filled 2019-11-10 (×4): qty 1

## 2019-11-10 MED ORDER — INSULIN ASPART 100 UNIT/ML ~~LOC~~ SOLN
0.0000 [IU] | Freq: Three times a day (TID) | SUBCUTANEOUS | Status: DC
  Administered 2019-11-10: 1 [IU] via SUBCUTANEOUS
  Administered 2019-11-10: 3 [IU] via SUBCUTANEOUS
  Administered 2019-11-11: 2 [IU] via SUBCUTANEOUS
  Administered 2019-11-11: 1 [IU] via SUBCUTANEOUS
  Administered 2019-11-11 – 2019-11-12 (×2): 2 [IU] via SUBCUTANEOUS
  Administered 2019-11-12: 1 [IU] via SUBCUTANEOUS

## 2019-11-10 MED ORDER — INSULIN ASPART 100 UNIT/ML ~~LOC~~ SOLN: 0.0000 [IU] | Freq: Every day | SUBCUTANEOUS | Status: DC

## 2019-11-10 MED ORDER — GADOBUTROL 1 MMOL/ML IV SOLN
7.0000 mL | Freq: Once | INTRAVENOUS | Status: AC | PRN
  Administered 2019-11-10: 7 mL via INTRAVENOUS

## 2019-11-10 MED ORDER — ASPIRIN EC 81 MG PO TBEC
81.0000 mg | DELAYED_RELEASE_TABLET | Freq: Every day | ORAL | Status: DC
  Administered 2019-11-10 – 2019-11-13 (×4): 81 mg via ORAL
  Filled 2019-11-10 (×4): qty 1

## 2019-11-10 NOTE — Progress Notes (Signed)
Report given to [facility]. Patient transferred to 4NProg. Room 5. All belongings taken with patient. VSS. IVF infusing. Patient A&OX4.   Blood pressure 129/85, pulse 73, temperature 98.6 F (37 C), temperature source Oral, resp. rate 20, height 5\' 2"  (1.575 m), weight 72.6 kg, SpO2 96 %.

## 2019-11-10 NOTE — Progress Notes (Signed)
Patient arrived to room accompanied by Wannetta Sender, RN and patient's fiancee. NADN.

## 2019-11-10 NOTE — Progress Notes (Addendum)
Neurosurgery Service Progress Note  Subjective: No acute events overnight, no new complaints  Objective: Vitals:   11/10/19 0800 11/10/19 0900 11/10/19 0942 11/10/19 1000  BP: 138/86 (!) 145/90 (!) 147/86 (!) 146/88  Pulse: 71  81 76  Resp: 14  18 15   Temp: 98.6 F (37 C)     TempSrc: Oral     SpO2: 95%  97% 95%  Weight:      Height:        Physical Exam: AOx3, PERRL, EOMI, FS, TM, Strength 5/5 x4, SILTx4 Incision c/d/i, some developing periorbital edema  Assessment & Plan: 74 y.o. woman s/p repeat bifrontal crani for recurrent meningioma. Post-op MRI reviewed, shows no evidence of residual tumor, some mixed density CSF / blood products in the resection cavity without significant mass effect, new thrombus from L transverse into the L jugular bulb.  -start ASA81 for sinus thrombus -transfer to stepdown, keep on tele -ISS & CBG qac/qhs while on dex, taper already in place -d/c A-line & foley -ADAT, activity as tolerated -PT/OT -SQH POD2  Joyice Faster Tytus Strahle  11/10/19 11:12 AM

## 2019-11-10 NOTE — Progress Notes (Signed)
Foley cath removed this AM POD 1. Patient ambulated to bathroom with 1 assist, +void at this time.

## 2019-11-11 ENCOUNTER — Encounter (HOSPITAL_COMMUNITY): Payer: Self-pay | Admitting: Neurosurgery

## 2019-11-11 LAB — GLUCOSE, CAPILLARY
Glucose-Capillary: 127 mg/dL — ABNORMAL HIGH (ref 70–99)
Glucose-Capillary: 154 mg/dL — ABNORMAL HIGH (ref 70–99)
Glucose-Capillary: 188 mg/dL — ABNORMAL HIGH (ref 70–99)
Glucose-Capillary: 182 mg/dL — ABNORMAL HIGH (ref 70–99)

## 2019-11-11 MED ORDER — HEPARIN SODIUM (PORCINE) 5000 UNIT/ML IJ SOLN
5000.0000 [IU] | Freq: Three times a day (TID) | INTRAMUSCULAR | Status: DC
  Administered 2019-11-11 – 2019-11-13 (×7): 5000 [IU] via SUBCUTANEOUS
  Filled 2019-11-11 (×7): qty 1

## 2019-11-11 NOTE — Progress Notes (Signed)
Neurosurgery Service Progress Note  Subjective: No acute events overnight, no new complaints, increase in L periorbital edema but not painful  Objective: Vitals:   11/11/19 0417 11/11/19 0500 11/11/19 0600 11/11/19 0759  BP: (!) 163/90   (!) 158/93  Pulse: 75   80  Resp: 16 15 14 16   Temp: 98 F (36.7 C)   98.4 F (36.9 C)  TempSrc: Oral     SpO2: 97%   98%  Weight:      Height:        Physical Exam: AOx3, PERRL, EOMI, FS, TM, Strength 5/5 x4, SILTx4 Incision c/d/i, L eye swollen shut  Assessment & Plan: 74 y.o. woman s/p repeat bifrontal crani for recurrent meningioma. Post-op MRI reviewed, shows no evidence of residual tumor, some mixed density CSF / blood products in the resection cavity without significant mass effect, new thrombus from L transverse into the L jugular bulb.  -RFV43 for sinus thrombus -PT/OT eval -ISS & CBG qac/qhs while on dex, taper already in place -ADAT, activity as tolerated -PT/OT -SQH  Laval Cafaro A Graceann Boileau  11/11/19 10:40 AM

## 2019-11-11 NOTE — Progress Notes (Signed)
Patient's left eye swollen closed on my midnight reassessment. Notified Dr. Zada Finders  And he seemed unconcerned right now. Will continue to monitor

## 2019-11-12 ENCOUNTER — Inpatient Hospital Stay: Payer: Medicare Other

## 2019-11-12 LAB — GLUCOSE, CAPILLARY
Glucose-Capillary: 99 mg/dL (ref 70–99)
Glucose-Capillary: 127 mg/dL — ABNORMAL HIGH (ref 70–99)
Glucose-Capillary: 153 mg/dL — ABNORMAL HIGH (ref 70–99)
Glucose-Capillary: 159 mg/dL — ABNORMAL HIGH (ref 70–99)

## 2019-11-12 MED ORDER — PANTOPRAZOLE SODIUM 40 MG PO TBEC
40.0000 mg | DELAYED_RELEASE_TABLET | Freq: Every day | ORAL | Status: DC
  Administered 2019-11-12: 40 mg via ORAL
  Filled 2019-11-12: qty 1

## 2019-11-12 NOTE — Progress Notes (Signed)
Subjective: Patient reports Mild headache manageable otherwise no complaints  Objective: Vital signs in last 24 hours: Temp:  [97.7 F (36.5 C)-98.4 F (36.9 C)] 97.7 F (36.5 C) (11/08 1102) Pulse Rate:  [65-94] 81 (11/08 1102) Resp:  [12-20] 20 (11/08 1102) BP: (131-178)/(82-103) 161/86 (11/08 1102) SpO2:  [94 %-100 %] 100 % (11/08 1102)  Intake/Output from previous day: 11/07 0701 - 11/08 0700 In: 1892.1 [P.O.:480; I.V.:1412.1] Out: 1150 [Urine:1150] Intake/Output this shift: Total I/O In: 250 [P.O.:250] Out: 200 [Urine:200]  Patient awake alert doing well incision clean dry intact continue to mobilize with physical and Occupational Therapy await physical therapy's determination today whether home with home health or inpatient rehab is in the patient's best interest.  Lab Results: Recent Labs    11/09/19 1339 11/09/19 1824  WBC  --  25.0*  HGB 10.9* 11.5*  HCT 32.0* 34.7*  PLT  --  192   BMET Recent Labs    11/09/19 1824 11/10/19 0430  NA 139 138  K 4.0 4.4  CL 108 107  CO2 19* 21*  GLUCOSE 184* 151*  BUN 19 15  CREATININE 0.87 0.87  CALCIUM 7.4* 8.0*    Studies/Results: No results found.  Assessment/Plan: Postop day 3 bicoronal craniotomy for resection of recurrent meningioma doing very well postop MRI scan looks great no evidence of recurrent tumor jugular bulb thrombosis patient currently on aspirin continue to work with physical and Occupational Therapy discharge probably tomorrow  LOS: 3 days     [name] [name] 11/12/2019, 12:13 PM

## 2019-11-12 NOTE — Care Management Important Message (Signed)
Important Message  Patient Details  Name: [name] [name] MRN: [id number] Date of Birth: 10-19-1945   Medicare Important Message Given:  Yes     Pate Aylward Montine Circle 11/12/2019, 4:42 PM

## 2019-11-12 NOTE — Evaluation (Signed)
Physical Therapy Evaluation Patient Details Name: [name] [name] MRN: [id number] DOB: 12-24-1945 Today's Date: 11/12/2019   History of Present Illness  Pt is a 74 yo female admitted for repeat B frontal craniotomy for recurrent meningioma.  Pt had original surgery two years ago due to szs and now is falling appx 1x a month with in ability to get up and new symptoms.    Clinical Impression  Pt in bed upon arrival of PT, agreeable to evaluation at this time. Prior to admission the pt was mobilizing without use of an AD, but also reports multiple falls at home requiring assist from her fiance to get up from the floor. The pt lives at home alone in a home with 3 steps to enter and 15 steps to reach her bedroom/bathroom. The pt now presents with limitations in functional mobility, endurance, strength, power, and stability due to above dx, and will continue to benefit from skilled PT to address these deficits. The pt was able to demo bed mobility and initial transfers with minA for stability and to power up to standing. The pt requires frequent cues for technique and safety, but is able to maintain corrections when given. The pt was limited to ~25 ft prior to needing seated rest, but was able to complete x2 through the session with minA and RW or minA and HHA. The pt does take significantly increased staggering steps when attempting HHA instead of RW. The pt's deficits with mobility are further compounded by slight decrease in safety awareness that requires cueing for safe technique and problem solving with mobility. Due to the pt's increased falls in the past following d/c, lack of long term supervision, and fiance's limited physical assistance, I feel the pt would benefit most from short stint of CIR level therapies to maximize safety and independence with mobility prior to d/c home.     Follow Up Recommendations CIR    Equipment Recommendations  None recommended by PT (defer to post acute)     Recommendations for Other Services Rehab consult     Precautions / Restrictions Precautions Precautions: Fall Precaution Comments: Pt states she falls 1x a month and cannot get up. She calls finance who comes home to assist. Pt is not intrested in a life line but prefers to keep phone with her.  Restrictions Weight Bearing Restrictions: No      Mobility  Bed Mobility Overal bed mobility: Needs Assistance Bed Mobility: Supine to Sit     Supine to sit: Min assist;HOB elevated     General bed mobility comments: min assist to get hips straight and feet flat on floor    Transfers Overall transfer level: Needs assistance Equipment used: Rolling walker (2 wheeled) Transfers: Sit to/from Stand Sit to Stand: Min guard;Min assist         General transfer comment: cues for hand placement with walker, minG vs minA with fatige  Ambulation/Gait Ambulation/Gait assistance: Min assist Gait Distance (Feet): 25 Feet (+ 20) Assistive device: Rolling walker (2 wheeled) Gait Pattern/deviations: Step-through pattern;Decreased stride length Gait velocity: reduced Gait velocity interpretation: <1.8 ft/sec, indicate of risk for recurrent falls General Gait Details: slowed gait with small steps and minimal clearance. minA at times for navigation around obstacles and increased staggering steps when trialing HHA vs RW  Stairs Stairs:  (pt has to navigate 15 steps to get to her bed/bath)              Balance Overall balance assessment: Needs assistance Sitting-balance support: Feet supported  Sitting balance-Leahy Scale: Good Sitting balance - Comments: supervision   Standing balance support: During functional activity;Bilateral upper extremity supported Standing balance-Leahy Scale: Poor Standing balance comment: reliant on BUE support to maintain balance                             Pertinent Vitals/Pain Pain Assessment: No/denies pain    Home Living Family/patient  expects to be discharged to:: Private residence Living Arrangements: Alone Available Help at Discharge: Family;Available 24 hours/day (for short time after d/c, fiance lives nearby) Type of Home: House Home Access: Stairs to enter Entrance Stairs-Rails: None Technical brewer of Steps: 3 Home Layout: Two level Home Equipment: Environmental consultant - 2 wheels Additional Comments: fiance can provide supervision, but limited in physical assist. [name] reports he can stay with her at her house if needed for a short time after d/c but has been less and less able to safely get her up after falls reccently    Prior Function Level of Independence: Needs assistance   Gait / Transfers Assistance Needed: Pt was walking without assistive device prior to surgery but was falling at times.  ADL's / Homemaking Assistance Needed: Pt reports she can usually make toast for breakfast, relies on fiance for lunch out and tends not to eat dinner. Pt reports mostly independent with dressing and bathing, but has had a few falls in shower  Comments: moving slowly and limited by fatigue. reports increase in falls     Hand Dominance   Dominant Hand: Right    Extremity/Trunk Assessment   Upper Extremity Assessment Upper Extremity Assessment: Overall WFL for tasks assessed    Lower Extremity Assessment Lower Extremity Assessment: Generalized weakness    Cervical / Trunk Assessment Cervical / Trunk Assessment: Normal  Communication   Communication: No difficulties  Cognition Arousal/Alertness: Awake/alert Behavior During Therapy: WFL for tasks assessed/performed Overall Cognitive Status: Impaired/Different from baseline Area of Impairment: Memory;Safety/judgement;Problem solving                     Memory: Decreased short-term memory   Safety/Judgement: Decreased awareness of safety;Decreased awareness of deficits   Problem Solving: Slow processing;Requires verbal cues General Comments: Pt appears to  be fairly functional conversationally, but required cues and prompting for safe mobility. Pt also soiled upon PT arrival and had not called for assist or to be cleaned.       General Comments General comments (skin integrity, edema, etc.): Pt and her fiance expressed concern about safety with d/c home due to falls after returning home in the past. The pt fatigues after each bout of 63ft, but was agreeable to continued activity. HR to 126 bpm with standing, 100-117bpm with gait    Exercises General Exercises - Lower Extremity Long Arc Quad: AAROM;Both;10 reps;Seated Heel Raises: AROM;Both;10 reps;Seated   Assessment/Plan    PT Assessment Patient needs continued PT services  PT Problem List Decreased strength;Decreased balance;Decreased activity tolerance;Decreased mobility;Decreased cognition;Decreased safety awareness;Decreased knowledge of precautions       PT Treatment Interventions Gait training;DME instruction;Therapeutic activities;Functional mobility training;Stair training;Therapeutic exercise;Balance training;Patient/family education;Cognitive remediation    PT Goals (Current goals can be found in the Care Plan section)  Acute Rehab PT Goals Patient Stated Goal: to be safe at home. PT Goal Formulation: With patient Time For Goal Achievement: 11/26/19 Potential to Achieve Goals: Good    Frequency Min 3X/week    AM-PAC PT "6 Clicks" Mobility  Outcome Measure Help needed  turning from your back to your side while in a flat bed without using bedrails?: A Little Help needed moving from lying on your back to sitting on the side of a flat bed without using bedrails?: A Little Help needed moving to and from a bed to a chair (including a wheelchair)?: A Little Help needed standing up from a chair using your arms (e.g., wheelchair or bedside chair)?: A Little Help needed to walk in hospital room?: A Lot Help needed climbing 3-5 steps with a railing? : A Lot 6 Click Score: 16     End of Session Equipment Utilized During Treatment: Gait belt Activity Tolerance: Patient tolerated treatment well Patient left: in chair;with call bell/phone within reach;with chair alarm set Nurse Communication: Mobility status PT Visit Diagnosis: Unsteadiness on feet (R26.81);Muscle weakness (generalized) (M62.81);History of falling (Z91.81);Difficulty in walking, not elsewhere classified (R26.2)    Time: 1320-1407 PT Time Calculation (min) (ACUTE ONLY): 47 min   Charges:   PT Evaluation $PT Eval Moderate Complexity: 1 Mod PT Treatments $Gait Training: 23-37 mins        Karma Ganja, PT, DPT   Acute Rehabilitation Department Pager #: [phone]  Otho Bellows 11/12/2019, 3:02 PM

## 2019-11-12 NOTE — Progress Notes (Signed)
Inpatient Rehab Admissions Coordinator Note:   Per PT recommendation, pt was screened for CIR candidacy by Gayland Curry, MS, CCC-SLP.  At this time we are not recommending an inpatient rehab consult.  Please contact me with questions.    Gayland Curry, [place], [place] Admissions Coordinator [phone] 11/12/19 5:12 PM

## 2019-11-12 NOTE — Evaluation (Signed)
Occupational Therapy Evaluation Patient Details Name: [name] [name] MRN: [id number] DOB: 11-03-1945 Today's Date: 11/12/2019    History of Present Illness Pt is a 74 yo female admitted for repeat B frontal craniotomy for recurrent meningioma.  Pt had original surgery two years ago due to szs and now is falling appx 1x a month with in ability to get up and new symptoms.   Clinical Impression   Pt admitted with the above diagnosis and has the deficits listed below. Pt would benefit from cont OT while in hospital to address mild cognitive deficits and mild balance deficits during adls in standing. Pt may need HHOT at d/c but will evaluate at time of d/c.  Will focus on adls in standing and functional transfers in next session.     Follow Up Recommendations  Home health OT;Supervision/Assistance - 24 hour (may or may not need HHOT at time of d/c)    Equipment Recommendations  3 in 1 bedside commode    Recommendations for Other Services       Precautions / Restrictions Precautions Precautions: Fall Precaution Comments: Pt states she falls 1x a month and cannot get up. She calls finance who comes home to assist. Pt is not intrested in a life line but prefers to keep phone with her.  Restrictions Weight Bearing Restrictions: No      Mobility Bed Mobility Overal bed mobility: Needs Assistance Bed Mobility: Supine to Sit     Supine to sit: Min assist;HOB elevated     General bed mobility comments: min assist to get hips straight and feet flat on floor    Transfers Overall transfer level: Needs assistance Equipment used: Rolling walker (2 wheeled) Transfers: Sit to/from Stand Sit to Stand: Min guard         General transfer comment: cues for hand placement with walker.    Balance Overall balance assessment: Needs assistance Sitting-balance support: Feet supported Sitting balance-Leahy Scale: Good     Standing balance support: During functional activity;Bilateral  upper extremity supported Standing balance-Leahy Scale: Fair Standing balance comment: close supervision to brush teeth at sink                           ADL either performed or assessed with clinical judgement   ADL Overall ADL's : Needs assistance/impaired Eating/Feeding: Set up;Sitting   Grooming: Wash/dry hands;Wash/dry face;Oral care;Supervision/safety;Standing;Cueing for safety   Upper Body Bathing: Set up;Sitting   Lower Body Bathing: Minimal assistance;Sit to/from stand;Cueing for compensatory techniques Lower Body Bathing Details (indicate cue type and reason): assist in standing for bottom/peri area Upper Body Dressing : Set up;Sitting   Lower Body Dressing: Minimal assistance;Cueing for compensatory techniques;Sit to/from stand   Toilet Transfer: Min guard;Ambulation;RW Toilet Transfer Details (indicate cue type and reason): cues for walker safety Toileting- Clothing Manipulation and Hygiene: Minimal assistance;Sit to/from stand       Functional mobility during ADLs: Minimal assistance;Rolling walker General ADL Comments: Pt overall doing very well with adls. Pt was fairly safe on her feet when doing adls with walker. Most limited by lines.       Vision Baseline Vision/History: Wears glasses Wears Glasses: Reading only Patient Visual Report: No change from baseline Vision Assessment?: No apparent visual deficits     Perception Perception Perception Tested?: No   Praxis Praxis Praxis tested?: Within functional limits    Pertinent Vitals/Pain Pain Assessment: No/denies pain     Hand Dominance Right   Extremity/Trunk Assessment Upper Extremity  Assessment Upper Extremity Assessment: Overall WFL for tasks assessed   Lower Extremity Assessment Lower Extremity Assessment: Defer to PT evaluation   Cervical / Trunk Assessment Cervical / Trunk Assessment: Normal   Communication Communication Communication: No difficulties   Cognition  Arousal/Alertness: Awake/alert Behavior During Therapy: WFL for tasks assessed/performed Overall Cognitive Status: Within Functional Limits for tasks assessed Area of Impairment: Memory                     Memory: Decreased short-term memory         General Comments: Pt appears to be fairly functional with basic cognition. Therapist did have to repeat questions at times due to confusion with answers but was accurage on second go around.  Will further assess.   General Comments  Pt overall mobilized well during adls. Do feel 24 hour supervision would be necessary at d/c for first week at [facility].    Exercises     Shoulder Instructions      Home Living Family/patient expects to be discharged to:: Private residence Living Arrangements: Alone Available Help at Discharge: Family;Available 24 hours/day Type of Home: House Home Access: Stairs to enter [employer] of Steps: 3 Entrance Stairs-Rails: None Home Layout: Two level Alternate Level Stairs-Number of Steps: flight Alternate Level Stairs-Rails: Left;Right Bathroom Shower/Tub: Occupational psychologist: Standard     Home Equipment: Environmental consultant - 2 wheels   Additional Comments: fiance can provide supervision, but limited in physical assist. [name] reports he can stay with her at her house if needed  Lives With: Alone    Prior Functioning/Environment Level of Independence: Needs assistance  Gait / Transfers Assistance Needed: Pt was walking without assistive device prior to surgery but was falling at times. ADL's / Homemaking Assistance Needed: Fiance helps with home skills and driving but pt was fairly independent with basic adls.            OT Problem List: Decreased strength;Decreased activity tolerance;Impaired balance (sitting and/or standing);Decreased knowledge of use of DME or AE;Decreased knowledge of precautions      OT Treatment/Interventions: Self-care/ADL training;Therapeutic  activities;Balance training    OT Goals(Current goals can be found in the care plan section) Acute Rehab OT Goals Patient Stated Goal: to be safe at home. OT Goal Formulation: With patient/family Time For Goal Achievement: 11/26/19 Potential to Achieve Goals: Good ADL Goals Pt Will Perform Grooming: with modified independence;standing Pt Will Perform Lower Body Bathing: with modified independence;sit to/from stand Pt Will Perform Lower Body Dressing: with supervision;sit to/from stand Pt Will Perform Tub/Shower Transfer: Shower transfer;rolling walker;3 in 1;with supervision Additional ADL Goal #1: Pt will walk to bathroom with walker and complete all toileting with supervision.  OT Frequency: Min 2X/week   Barriers to D/C:    fiance to help at d/c       Co-evaluation              AM-PAC OT "6 Clicks" Daily Activity     Outcome Measure Help from another person eating meals?: None Help from another person taking care of personal grooming?: None Help from another person toileting, which includes using toliet, bedpan, or urinal?: A Little Help from another person bathing (including washing, rinsing, drying)?: A Little Help from another person to put on and taking off regular upper body clothing?: A Little Help from another person to put on and taking off regular lower body clothing?: A Little 6 Click Score: 20   End of Session Equipment Utilized  During Treatment: Gait belt Nurse Communication: Mobility status;Precautions  Activity Tolerance: Patient tolerated treatment well Patient left: in chair;with call bell/phone within reach  OT Visit Diagnosis: Unsteadiness on feet (R26.81)                Time: 2080-2233 OT Time Calculation (min): 30 min Charges:  OT General Charges $OT Visit: 1 Visit OT Evaluation $OT Eval Moderate Complexity: 1 Mod  Glenford Peers 11/12/2019, 10:28 AM

## 2019-11-13 LAB — GLUCOSE, CAPILLARY
Glucose-Capillary: 95 mg/dL (ref 70–99)
Glucose-Capillary: 107 mg/dL — ABNORMAL HIGH (ref 70–99)

## 2019-11-13 MED ORDER — HYDROCODONE-ACETAMINOPHEN 5-325 MG PO TABS: 1.0000 | ORAL_TABLET | ORAL | 0 refills | Status: DC | PRN

## 2019-11-13 NOTE — Progress Notes (Signed)
Patient IV removed. 3 in 1 delivered and sent home with patient. All d/c orders covered and questions answered. Patient transported to car by wheelchair without incident.

## 2019-11-13 NOTE — Progress Notes (Signed)
Physical Therapy Treatment Patient Details Name: [name] [name] MRN: [id number] DOB: 1945/07/11 Today's Date: 11/13/2019    History of Present Illness Pt is a 74 yo female admitted for repeat B frontal craniotomy for recurrent meningioma.  Pt had original surgery two years ago due to szs and now is falling appx 1x a month with in ability to get up and new symptoms.    PT Comments    Pt making good progress today.  Noted pt not a candidate for CIR and plan is to return home.  Pt can have initial 24 hr supervision.  She has RW but would need a 3in1 BSC in order to improve safety.  She does have a hx of falls at home - so recommend initial supervision, use of RW, and obtaining the 3in1.  Would also benefit from HHPT.  Today, she was able to ambulate 200'x2 and perform 15 steps with a rest break.  While hospitalized continue POC.     Follow Up Recommendations  Home health PT;Supervision/Assistance - 24 hour     Equipment Recommendations  3in1 (PT)    Recommendations for Other Services       Precautions / Restrictions Precautions Precautions: Fall Precaution Comments: Pt states she falls 1x a month and cannot get up. She calls finance who comes home to assist. Pt is not intrested in a life line but prefers to keep phone with her.     Mobility  Bed Mobility Overal bed mobility: Needs Assistance Bed Mobility: Supine to Sit     Supine to sit: Supervision     General bed mobility comments: increased time but no assist  Transfers Overall transfer level: Needs assistance Equipment used: Rolling walker (2 wheeled) Transfers: Sit to/from Stand Sit to Stand: Supervision         General transfer comment: Demonstrated safe hand placement.  Supervision to stand from bed.  She did take a seated rest break on stairs and required min A to stand from there for safety.  Ambulation/Gait Ambulation/Gait assistance: Supervision Gait Distance (Feet): 200 Feet (200'x2) Assistive device:  Rolling walker (2 wheeled) Gait Pattern/deviations: Step-through pattern;Decreased stride length     General Gait Details: Cautious speed but steady.  Able to navigate around objects.  Encouraged use of RW at home   Stairs Stairs: Yes Stairs assistance: Min guard Stair Management: One rail Left;Step to pattern Number of Stairs: 15 General stair comments: Pt went up a flight of stairs but was fatigued and needed rest break.  Stepped down 2 steps then sat on top step.  After 3 mins pt able to continue with stairs and walk. All VSS.   Wheelchair Mobility    Modified Rankin (Stroke Patients Only)       Balance Overall balance assessment: Needs assistance Sitting-balance support: Feet supported Sitting balance-Leahy Scale: Good     Standing balance support: During functional activity;No upper extremity supported Standing balance-Leahy Scale: Fair Standing balance comment: Used RW for ambulation but was able to lift hands for static stand                            Cognition Arousal/Alertness: Awake/alert Behavior During Therapy: WFL for tasks assessed/performed Overall Cognitive Status: Impaired/Different from baseline Area of Impairment: Problem solving                             Problem Solving: Slow processing General Comments: Pt  required increased time but was able to participate with all task.  She was able to recall room number and navigate back from stairwell with several turns.      Exercises      General Comments General comments (skin integrity, edema, etc.): Pt required rest break with ambulation but all VSS.  Pt's fiance present at end of session.  Discussed pt performed stairs and 200' x2 ambulation today.  He reports he will be able to provide 24 hr support initially and is able to provide guarding with stairs for safety.  Educated pt on having someone close for stairs, continuing HHPT, and use of RW.  Also, recommend 3in1 BSC for use  for nighttime urgency to prevent falls if she was in a hurry and could use as shower chair.      Pertinent Vitals/Pain Pain Assessment: No/denies pain    Home Living                      Prior Function            PT Goals (current goals can now be found in the care plan section) Acute Rehab PT Goals Patient Stated Goal: to be safe at home. PT Goal Formulation: With patient/family Time For Goal Achievement: 11/26/19 Potential to Achieve Goals: Good Progress towards PT goals: Progressing toward goals    Frequency    Min 3X/week      PT Plan Discharge plan needs to be updated (noted CIR denied pt)    Co-evaluation              AM-PAC PT "6 Clicks" Mobility   Outcome Measure  Help needed turning from your back to your side while in a flat bed without using bedrails?: None Help needed moving from lying on your back to sitting on the side of a flat bed without using bedrails?: None Help needed moving to and from a bed to a chair (including a wheelchair)?: None Help needed standing up from a chair using your arms (e.g., wheelchair or bedside chair)?: None Help needed to walk in hospital room?: A Little Help needed climbing 3-5 steps with a railing? : A Little 6 Click Score: 22    End of Session Equipment Utilized During Treatment: Gait belt Activity Tolerance: Patient tolerated treatment well Patient left: in chair;with call bell/phone within reach;with chair alarm set;with family/visitor present Nurse Communication: Mobility status PT Visit Diagnosis: Unsteadiness on feet (R26.81);Muscle weakness (generalized) (M62.81);History of falling (Z91.81);Difficulty in walking, not elsewhere classified (R26.2)     Time: 1232-1300 PT Time Calculation (min) (ACUTE ONLY): 28 min  Charges:  $Gait Training: 8-22 mins $Therapeutic Activity: 8-22 mins                     Abran Richard, PT Acute Rehab Services Pager [phone] [facility]  [place] 11/13/2019, 2:14 PM

## 2019-11-13 NOTE — Discharge Summary (Signed)
Physician Discharge Summary  Patient ID: [name] [name] MRN: [id number] DOB/AGE: [birth year] 74 y.o.  Admit date: 11/09/2019 Discharge date: 11/13/2019  Admission Diagnoses: Recurrent parasagittal frontal meningioma   Discharge Diagnoses: same   Discharged Condition: good  Hospital Course: The patient was admitted on 11/09/2019 and taken to the operating room where the patient underwent redo cranio for tumor resection. The patient tolerated the procedure well and was taken to the recovery room and then to the floor in stable condition. The hospital course was routine. There were no complications. The wound remained clean dry and intact. Pt had appropriate head soreness. The patient remained afebrile with stable vital signs, and tolerated a regular diet. The patient continued to increase activities, and pain was well controlled with oral pain medications.   Consults: None  Significant Diagnostic Studies:  Results for orders placed or performed during the hospital encounter of 11/09/19  MRSA PCR Screening   Specimen: Nasal Mucosa; Nasopharyngeal  Result Value Ref Range   MRSA by PCR NEGATIVE NEGATIVE  Basic metabolic panel per protocol  Result Value Ref Range   Sodium 135 135 - 145 mmol/L   Potassium 3.9 3.5 - 5.1 mmol/L   Chloride 100 98 - 111 mmol/L   CO2 23 22 - 32 mmol/L   Glucose, Bld 113 (H) 70 - 99 mg/dL   BUN 21 8 - 23 mg/dL   Creatinine, Ser 0.78 0.44 - 1.00 mg/dL   Calcium 8.6 (L) 8.9 - 10.3 mg/dL   GFR, Estimated >60 >60 mL/min   Anion gap 12 5 - 15  CBC per protocol  Result Value Ref Range   WBC 14.1 (H) 4.0 - 10.5 K/uL   RBC 4.13 3.87 - 5.11 MIL/uL   Hemoglobin 13.2 12.0 - 15.0 g/dL   HCT 39.6 36 - 46 %   MCV 95.9 80.0 - 100.0 fL   MCH 32.0 26.0 - 34.0 pg   MCHC 33.3 30.0 - 36.0 g/dL   RDW 12.6 11.5 - 15.5 %   Platelets 249 150 - 400 K/uL   nRBC 0.0 0.0 - 0.2 %  Basic metabolic panel  Result Value Ref Range   Sodium 139 135 - 145 mmol/L   Potassium 4.0 3.5  - 5.1 mmol/L   Chloride 108 98 - 111 mmol/L   CO2 19 (L) 22 - 32 mmol/L   Glucose, Bld 184 (H) 70 - 99 mg/dL   BUN 19 8 - 23 mg/dL   Creatinine, Ser 0.87 0.44 - 1.00 mg/dL   Calcium 7.4 (L) 8.9 - 10.3 mg/dL   GFR, Estimated >60 >60 mL/min   Anion gap 12 5 - 15  CBC  Result Value Ref Range   WBC 25.0 (H) 4.0 - 10.5 K/uL   RBC 3.59 (L) 3.87 - 5.11 MIL/uL   Hemoglobin 11.5 (L) 12.0 - 15.0 g/dL   HCT 34.7 (L) 36 - 46 %   MCV 96.7 80.0 - 100.0 fL   MCH 32.0 26.0 - 34.0 pg   MCHC 33.1 30.0 - 36.0 g/dL   RDW 12.7 11.5 - 15.5 %   Platelets 192 150 - 400 K/uL   nRBC 0.0 0.0 - 0.2 %  Basic metabolic panel  Result Value Ref Range   Sodium 138 135 - 145 mmol/L   Potassium 4.4 3.5 - 5.1 mmol/L   Chloride 107 98 - 111 mmol/L   CO2 21 (L) 22 - 32 mmol/L   Glucose, Bld 151 (H) 70 - 99 mg/dL   BUN 15  8 - 23 mg/dL   Creatinine, Ser 0.87 0.44 - 1.00 mg/dL   Calcium 8.0 (L) 8.9 - 10.3 mg/dL   GFR, Estimated >60 >60 mL/min   Anion gap 10 5 - 15  Hemoglobin A1c  Result Value Ref Range   Hgb A1c MFr Bld 6.1 (H) 4.8 - 5.6 %   Mean Plasma Glucose 128.37 mg/dL  Glucose, capillary  Result Value Ref Range   Glucose-Capillary 148 (H) 70 - 99 mg/dL  Glucose, capillary  Result Value Ref Range   Glucose-Capillary 201 (H) 70 - 99 mg/dL  Glucose, capillary  Result Value Ref Range   Glucose-Capillary 168 (H) 70 - 99 mg/dL  Glucose, capillary  Result Value Ref Range   Glucose-Capillary 127 (H) 70 - 99 mg/dL  Glucose, capillary  Result Value Ref Range   Glucose-Capillary 154 (H) 70 - 99 mg/dL  Glucose, capillary  Result Value Ref Range   Glucose-Capillary 188 (H) 70 - 99 mg/dL  Glucose, capillary  Result Value Ref Range   Glucose-Capillary 182 (H) 70 - 99 mg/dL  Glucose, capillary  Result Value Ref Range   Glucose-Capillary 99 70 - 99 mg/dL  Glucose, capillary  Result Value Ref Range   Glucose-Capillary 127 (H) 70 - 99 mg/dL  Glucose, capillary  Result Value Ref Range   Glucose-Capillary  153 (H) 70 - 99 mg/dL  Glucose, capillary  Result Value Ref Range   Glucose-Capillary 159 (H) 70 - 99 mg/dL  I-STAT 7, (LYTES, BLD GAS, ICA, H+H)  Result Value Ref Range   pH, Arterial 7.493 (H) 7.35 - 7.45   pCO2 arterial 28.6 (L) 32 - 48 mmHg   pO2, Arterial 57 (L) 83 - 108 mmHg   Bicarbonate 22.4 20.0 - 28.0 mmol/L   TCO2 23 22 - 32 mmol/L   O2 Saturation 94.0 %   Acid-base deficit 1.0 0.0 - 2.0 mmol/L   Sodium 141 135 - 145 mmol/L   Potassium 3.7 3.5 - 5.1 mmol/L   Calcium, Ion 1.13 (L) 1.15 - 1.40 mmol/L   HCT 33.0 (L) 36 - 46 %   Hemoglobin 11.2 (L) 12.0 - 15.0 g/dL   Patient temperature 35.0 C    Sample type ARTERIAL   I-STAT 7, (LYTES, BLD GAS, ICA, H+H)  Result Value Ref Range   pH, Arterial 7.464 (H) 7.35 - 7.45   pCO2 arterial 31.6 (L) 32 - 48 mmHg   pO2, Arterial 124 (H) 83 - 108 mmHg   Bicarbonate 23.1 20.0 - 28.0 mmol/L   TCO2 24 22 - 32 mmol/L   O2 Saturation 99.0 %   Acid-base deficit 1.0 0.0 - 2.0 mmol/L   Sodium 141 135 - 145 mmol/L   Potassium 3.9 3.5 - 5.1 mmol/L   Calcium, Ion 1.12 (L) 1.15 - 1.40 mmol/L   HCT 32.0 (L) 36 - 46 %   Hemoglobin 10.9 (L) 12.0 - 15.0 g/dL   Patient temperature 35.2 C    Sample type ARTERIAL   Type and screen [place]  Result Value Ref Range   ABO/RH(D) O NEG    Antibody Screen NEG    Sample Expiration      11/12/2019,2359 Performed at [facility], [address], [place], [place] [address]     MR BRAIN W WO CONTRAST  Result Date: 11/10/2019 CLINICAL DATA:  Benign neoplasm, brain/CNS. Additional history provided: Post craniotomy benign neoplasm, brain/CNS. EXAM: MRI HEAD WITHOUT AND WITH CONTRAST TECHNIQUE: Multiplanar, multiecho pulse sequences of the  brain and surrounding structures were obtained without and with intravenous contrast. CONTRAST:  52mL GADAVIST GADOBUTROL 1 MMOL/ML IV SOLN COMPARISON:  Brain MRI 11/03/2019. FINDINGS: Brain: Postoperative changes from interval resection of a left  frontal region meningioma which had crossed midline. There is a small amount of pneumocephalus within the resection cavity and overlying the frontal lobes. There are foci of restricted diffusion surrounding the resection cavity within the bilateral frontal lobes, likely reflecting devitalized tissue. Small amount of T1 hyperintense subacute blood products along the margins of the resection cavity. There is a 5 mm focus of subtly nodular dural thickening and enhancement overlying the left frontal lobe, which could reflect trace residual dural-based tumor (series 19, image 19). There is otherwise nonspecific smooth dural thickening and enhancement along the bilateral frontoparietal convexities. Prominent vasogenic edema within the left greater than right cerebral hemispheres has not significantly changed. Unchanged extra-axial mass involving the left cavernous sinus, orbital apex, sella, suprasellar cistern, Meckel's cave and middle cranial fossa. As before, there is encasement and mild narrowing of the left ICA. Unchanged rightward deviation of the pituitary stalk. The mass surrounds the cisternal optic nerve and mildly uplifts the left aspect of the optic chiasm. Stable background mild multifocal T2/FLAIR hyperintensity within the cerebral white matter which is nonspecific, but compatible with chronic small vessel ischemic disease. Redemonstrated chronic microhemorrhage within the callosum splenium. No midline shift. Vascular: Expected proximal arterial flow voids. The anterior aspect of the superior sagittal sinus appears to have been sacrificed. New from prior examinations, there is fairly extensive thrombus within the left sigmoid sinus and left jugular bulb. Skull and upper cervical spine: Frontoparietal cranioplasty. Sinuses/Orbits: Visualized orbits show no acute finding. Mild left posterior ethmoid mucosal thickening. Impressipn #5 will be called to the ordering clinician or representative by the Radiologist  Assistant, and communication documented in the PACS or [employer]. IMPRESSION: 1. Postoperative changes from interval resection of a left frontal region meningioma, which had previously crossed midline. There appears to have been essentially a gross total resection. However, there is a subtle 5 mm nodular focus of dural thickening along the left frontal lobe, which could reflect trace residual dural-based tumor. 2. Foci of restricted diffusion within bilateral frontal lobes, likely reflecting devitalized tissue. 3. Prominent vasogenic edema within the left greater than right cerebral hemispheres, unchanged. 4. Unchanged meningioma centered in the left cavernous sinus region. 5. New from prior examinations, there is fairly extensive thrombus within the left sigmoid sinus and left jugular bulb. Electronically Signed   By: Kellie Simmering DO   On: 11/10/2019 11:56   MR BRAIN W WO CONTRAST  Result Date: 11/04/2019 CLINICAL DATA:  Meningioma, plan for surgery EXAM: MRI HEAD WITHOUT AND WITH CONTRAST TECHNIQUE: Multiplanar, multiecho pulse sequences of the brain and surrounding structures were obtained without and with intravenous contrast. CONTRAST:  53mL GADAVIST GADOBUTROL 1 MMOL/ML IV SOLN COMPARISON:  10/19/2019 FINDINGS: Brain: There has been no substantial change in size or appearance of extra-axial lesion along the left frontal convexity underlying bifrontal craniotomy. Stable mass effect on the underlying brain parenchyma. As before, there is contralateral extension across the falx and involvement of the anterior superior sagittal sinus and probable encasement of distal ACA branches. Underlying parenchymal edema is stable. Extra-axial mass involving the left cavernous sinus, left orbital apex, sella, suprasellar cistern, Meckel's cave, and middle cranial fossa is also not substantially changed. As before, there is encasement and mild narrowing of the ICA. Pituitary infundibulum is deviated to the right.  Mass  surrounds the cisternal optic nerve and mildly displaces the optic chiasm. There is no underlying parenchymal edema. There is no acute infarction or intracranial hemorrhage. Vascular: As above.  Otherwise unremarkable. Skull and upper cervical spine: Normal marrow signal is preserved. Sinuses/Orbits: Minor mucosal thickening. Bilateral lens replacements. Other: Mastoid air cells are clear. IMPRESSION: No substantial change since recent prior study in size and appearance of left frontal meningioma and associated mass effect and edema. 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 Electronically Signed   By: Macy Mis M.D.   On: 11/04/2019 09:46   MR BRAIN W WO CONTRAST  Result Date: 10/19/2019 CLINICAL DATA:  Brain/CNS neoplasm.  Assess treatment response. EXAM: MRI HEAD WITHOUT AND WITH CONTRAST TECHNIQUE: Multiplanar, multiecho pulse sequences of the brain and surrounding structures were obtained without and with intravenous contrast. CONTRAST:  39mL MULTIHANCE GADOBENATE DIMEGLUMINE 529 MG/ML IV SOLN COMPARISON:  MRI of the brain July 14, 2019. FINDINGS: Brain: Postsurgical changes from bifrontal craniotomy. Significant interval increase in size of the dural-based extra-axial left frontal lesion measuring approximately 5.2 x 4.7 x 3.4 cm compared to 3.7 x 3.0 x 2.3 cm on prior. The lesion has peripheral irregular contrast enhancement with necrotic center crosses the midline involving the cerebral falx, superior sagittal sinus in distal branches of the bilateral ACA with significant mass effect and increased edema within the bilateral frontal lobes. No significant change of the extra-axial mass lesion involving the left cavernous sinus, left orbital apex, sella, suprasellar cistern and middle cranial fossa. Lesion measures approximate 3.3 x 3.3 x 2.9 cm compared to 3.3 x 3.3 x 3.0 cm on prior. There is involvement of the left cavernous sinus, left Meckel's cave, left ICA from the proximal  cavernous to the terminus, cisternal segment of the left of topic nerve with mass effect on the optic chiasm. The pituitary stalk deviated to the right. Vascular: Major arterial flow voids are preserved. Skull and upper cervical spine: Normal marrow signal. Sinuses/Orbits: Bilateral lens surgery. Mild mucosal thickening of the ethmoid cells and sphenoid sinuses. Other: None. IMPRESSION: 1. Significant interval increase in size of the left frontal meningioma measuring approximately 5.2 x 4.7 x 3.4 cm compared to 3.7 x 3.0 x 2.3 cm on prior with increased mass effect and edema within the bilateral frontal lobes. 2. No significant change of the known left cavernous meningioma. These results will be called to the ordering clinician or representative by the Radiologist Assistant, and communication documented in the PACS or [employer]. Electronically Signed   By: Pedro Earls M.D.   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  EEG to evaluate for seizures. Level of alertness: Awake,asleep AEDs during EEG study: LEV Technical aspects: This EEG study was done with scalp electrodes positioned according to the 10-20 International system of electrode placement. Electrical activity was acquired at a sampling rate of 500Hz  and reviewed with a high frequency filter of 70Hz  and a low frequency filter of 1Hz . EEG data were recorded continuously and digitally stored. Description: The posterior dominant rhythm consists of 9-10 Hz activity of moderate voltage (25-35 uV) seen predominantly in  posterior head regions, symmetric  and reactive to eye opening and eye closing. Sleep was characterized by vertex waves, sleep spindles (12 to 14 Hz), maximal frontocentral region.  EEG showed 3 to 5 Hz theta-delta slowing in bifrontal region.  Sharp waves were also noted in bifrontal region.  Hyperventilation and photic stimulation were not performed.   ABNORMALITY -Sharp waves, bifrontal -Continuous slow, bifrontal region IMPRESSION: This study showed evidence of epileptogenicity as well as cortical dysfunction arising from bifrontal region likely secondary to underlying meningioma and edema.  No seizures were seen throughout the recording. Lora Havens   CT HEAD CODE STROKE WO CONTRAST  Result Date: 10/22/2019 CLINICAL DATA:  Code stroke. Neuro deficit, acute stroke suspected. Expressive aphasia, confusion. EXAM: CT HEAD WITHOUT CONTRAST TECHNIQUE: Contiguous axial images were obtained from the base of the skull through the vertex without intravenous contrast. COMPARISON:  MRI 10/19/2019 FINDINGS: Brain: Redemonstrated large left frontal meningioma with exuberant surrounding vasogenic edema, not substantially changed when comparing across modalities to MRI from 10/19/2019. Similar mass effect. 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 No evidence of acute large vascular territory infarct. No acute hemorrhage. No hydrocephalus. Vascular: No hyperdense vessel or unexpected calcification. Skull: Postsurgical changes of bifrontal craniotomy. Sinuses/Orbits: Scattered ethmoid air cell mucosal thickening with secretions in the posterior left ethmoid air cell. Additionally, secretions within the left sphenoid sinus. No acute orbital abnormality. Other: No mastoid effusions. ASPECTS [facility] Stroke Program Early CT Score) total score (0-10 with 10 being normal): 10 IMPRESSION: 1. Redemonstrated large left frontal meningioma with exuberant surrounding vasogenic edema, not substantially  changed when comparing across modalities to MRI from 10/19/2019. 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 2. No evidence of superimposed acute large vascular territory infarct or acute hemorrhage. ASPECTS is 10 Code stroke imaging results were communicated on 10/22/2019 at 11:56 am to provider Dr. Annice Pih via telephone, who verbally acknowledged these results. Electronically Signed   By: Margaretha Sheffield MD   On: 10/22/2019 12:05    Antibiotics:  Anti-infectives (From admission, onward)   Start     Dose/Rate Route Frequency Ordered Stop   11/09/19 2200  ceFAZolin (ANCEF) IVPB 2g/100 mL premix        2 g 200 mL/hr over 30 Minutes Intravenous Every 8 hours 11/09/19 1752 11/10/19 0553   11/09/19 0730  ceFAZolin (ANCEF) IVPB 2g/100 mL premix        2 g 200 mL/hr over 30 Minutes Intravenous On call to O.R. 11/09/19 0723 11/09/19 1440      Discharge Exam: Blood pressure (!) 169/103, pulse 71, temperature 98.5 F (36.9 C), temperature source Oral, resp. rate 13, height 5\' 2"  (1.575 m), weight 72.6 kg, SpO2 99 %. Neurologic: Grossly normal Ambulating and voiding well, incision cdi  Discharge Medications:   Allergies as of 11/13/2019      Reactions   Bee Venom Swelling, Other (See Comments)   Site of swelling not recalled by family- happened during childhood   Losartan Itching      Medication List    TAKE these medications   acetaminophen 325 MG tablet Commonly known as: TYLENOL Take 325-650 mg by mouth every 6 (six) hours as needed for mild pain or headache.   Calcium 500-100 MG-UNIT Chew Chew 1 tablet by mouth daily.   dexamethasone 4 MG tablet Commonly known as: DECADRON Take 1 tablet (4 mg total) by mouth 2 (two) times daily.   ezetimibe 10 MG tablet Commonly known as: ZETIA Take 1 tablet (10 mg total)  by mouth daily.   HAIR SKIN & NAILS GUMMIES PO Take 1 tablet by mouth daily.   HYDROcodone-acetaminophen 5-325 MG tablet Commonly known as:  NORCO/VICODIN Take 1 tablet by mouth every 4 (four) hours as needed for moderate pain.   ibuprofen 200 MG tablet Commonly known as: ADVIL Take 200-400 mg by mouth every 6 (six) hours as needed for headache or mild pain.   levETIRAcetam 500 MG tablet Commonly known as: KEPPRA Take 1 tablet (500 mg total) by mouth 2 (two) times daily.   PRENATAL ADULT GUMMY/DHA/FA PO Take 1 tablet by mouth daily.       Disposition: home   Final Dx: redo crani for tumor resection  Discharge Instructions    Call MD for:  difficulty breathing, headache or visual disturbances   Complete by: As directed    Call MD for:  hives   Complete by: As directed    Call MD for:  persistant nausea and vomiting   Complete by: As directed    Call MD for:  redness, tenderness, or signs of infection (pain, swelling, redness, odor or green/yellow discharge around incision site)   Complete by: As directed    Call MD for:  severe uncontrolled pain   Complete by: As directed    Call MD for:  temperature >100.4   Complete by: As directed    Diet - low sodium heart healthy   Complete by: As directed    Increase activity slowly   Complete by: As directed    No wound care   Complete by: As directed          Signed: Ocie Cornfield Jahking Lesser 11/13/2019, 8:06 AM

## 2019-11-13 NOTE — TOC Transition Note (Signed)
Transition of Care (TOC) - CM/SW Discharge Note Marvetta Gibbons RN,BSN Transitions of Care Unit 4NP (non trauma) - RN Case Manager See Treatment Team for direct Phone #   Patient Details  Name: [name] [name] MRN: [id number] Date of Birth: July 03, 1945  Transition of Care [facility]) CM/SW Contact:  Dawayne Patricia, RN Phone Number: 11/13/2019, 2:30 PM   Clinical Narrative:    Pt stable for transition home today, orders placed for HHPT/OT. Noted PT eval from yesterday rec for CIR- will ask PT to see pt again today prior to discharge to be sure pt safe for return home with [facility] and any DME needs. CM in to speak with pt and her fiance at the bedside- per discussion pt has walker at home and was active with [facility] prior to admission. She had gone to a SNF for rehab however from there decided to return home with [facility] and did not complete rehab. Fiance states he will be available to assist. Will await updated recs from PT. Pt states she would like to return home but is open to rehab If she really needs it. Explained it would be dependent on insurance approval.   1430- received msg from PT that she had seen pt and updated recommendation to home with [facility]. Recommended also given for 3n1. Order has been placed for DME. Call made to [employer] for 3n1 referral- DME_ 3n1 to be delivered to room prior to discharge.   Call made to [facility] with Alvis Lemmings for resumption of [place] to f/u with pt to restart therapy visits   Final next level of care: [place] Barriers to Discharge: No Barriers Identified   Patient Goals and CMS Choice Patient states their goals for this hospitalization and ongoing recovery are:: return home and get better CMS Medicare.gov Compare Post Acute Care list provided to:: Patient Choice offered to / list presented to : Patient  Discharge Placement                  Home with [facility]      Discharge Plan and Services   Discharge Planning  Services: CM Consult Post Acute Care Choice: Durable Medical Equipment          DME Arranged: 3-N-1 DME Agency: [employer] Date DME Agency Contacted: 11/13/19 Time DME Agency Contacted: [phone] Representative spoke with at DME Agency: [place]: PT, OT [place] Agency: [place] Date [place]: 11/13/19 Time [place]: 1140 Representative spoke with at [place]: [place] Interventions     Readmission Risk Interventions Readmission Risk Prevention Plan 11/13/2019  Post Dischage Appt Complete  Medication Screening Complete  Transportation Screening Complete  Some recent data might be hidden

## 2019-11-15 ENCOUNTER — Telehealth: Payer: Self-pay

## 2019-11-15 ENCOUNTER — Encounter (HOSPITAL_COMMUNITY): Payer: Self-pay | Admitting: Neurosurgery

## 2019-11-15 NOTE — Telephone Encounter (Signed)
Called to check on [name] [name] She was DC from hospital 11/9  Hospital Course: The patient was admitted on 11/09/2019 and taken to the operating room where the patient underwent redo cranio for tumor resection. The patient tolerated the procedure well and was taken to the recovery room and then to the floor in stable condition. The hospital course was routine. There were no complications. The wound remained clean dry and intact. Pt had appropriate head soreness. The patient remained afebrile with stable vital signs, and tolerated a regular diet. The patient continued to increase activities, and pain was well controlled with oral pain medications.   Called pt number- no answer, cannot LM.  Called her BF [name] and spoke with him.  He reports Donnamae seems well, just tired.  They have not noted any particular change otherwise. She is seeing me on Monday for follow-up

## 2019-11-15 NOTE — Telephone Encounter (Signed)
Spoke to [name] [name] [facility] [employer], the after care therapist, Pt was recently hospitalized for altered mental status and tumor removal. Timmothy Sours was concerned about the patient's pulse. Pulse was ranging from 104-110 resting. Pt not having other sxs. I advised for patient to schedule a hospital f/u and if patient becomes altered or starts having other sxs to return to the ED.   Don's phone number: [phone]

## 2019-11-16 ENCOUNTER — Telehealth: Payer: Self-pay | Admitting: Family Medicine

## 2019-11-16 NOTE — Patient Instructions (Addendum)
Good to see you again today - I am glad you are home and doing better.  We will check your blood counts and calcium level today, I will be in touch with these reports.  I will also get in touch with your neurologist and Dr. Saintclair Halsted, and asked about plans for your steroids and Keppra medication  Compression socks may be helpful for the swelling in your legs Please let me know if I can do anything else to help.  Assuming you are doing okay, let us plan to visit in about 3 months

## 2019-11-16 NOTE — Telephone Encounter (Signed)
Don with [facility] calling for Verbal orders for  OT  1 time a week for 2 weeks  0 times a week for 1   1 time a week for 2   [facility] transfers   [phone]

## 2019-11-16 NOTE — Progress Notes (Addendum)
[place] at [facility] [address], [place], [place] [address] [phone]  Date:  11/19/2019   Name:  [name] [name]   DOB:  07/08/1945   MRN:  [id number]  PCP:  Darreld Mclean, MD    Chief Complaint: Hospitalization Follow-up (Pt states that she has swollen in legs.)   History of Present Illness:  [name] [name] is a 74 y.o. very pleasant female patient who presents with the following:  Here today for a hospital follow-up.  I last saw her in March at which time she was doing fine. History of cerebral meningioma, osteoporosis, hyperlipidemia, IBS, pulmonary embolism She presented to the ER on 10/18 with AMS- found to have recurrent meningioma.  Surgery was planned and she was readmitted for procedure earlier this month   Admit date: 11/09/2019 Discharge date: 11/13/2019 Admission Diagnoses: Recurrent parasagittal frontal meningioma Hospital Course: The patient was admitted on 11/09/2019 and taken to the operating room where the patient underwent redo cranio for tumor resection. The patient tolerated the procedure well and was taken to the recovery room and then to the floor in stable condition. The hospital course was routine. There were no complications. The wound remained clean dry and intact. Pt had appropriate head soreness. The patient remained afebrile with stable vital signs, and tolerated a regular diet. The patient continued to increase activities, and pain was well controlled with oral pain medications  She went straight home from the hospital  She is not having headaches, she is tired Her BF [name] notes that she is improved but not yet 100% back to normal She is on keppra which was started at her first hospital stay- this was to suppress seizure activity which was noted on EEG.  Per patient report, they have not been aware of any more visible seizure activity  They are seeing Dr Saintclair Halsted this week on 11/18 She is also  following up with Dr. Mickeal Skinner with oncology-her most recent hospital notes state that neurology was consulted, however I believe she last saw neurology when her meningioma was originally diagnosed in 2019  She is eating well She is still using decadron 4 mg twice a day No vomiting or abdominal pain  DR Penumalli was her regular neurologist but again I think she just saw him once  She is having some lower extremity edema bilaterally.  This does seem to improve overnight get worse as the day goes on  Her weight is up a few pounds, she has history of some diastolic cardiac dysfunction.  However no shortness of breath   Patient Active Problem List   Diagnosis Date Noted  . Brain tumor (benign) ([place]) 11/09/2019  . Acute metabolic encephalopathy 81/85/6314  . AMS (altered mental status) 10/22/2019  . Meningioma, cerebral ([place]) 12/22/2018  . Acute pulmonary embolism ([place]) 09/28/2017  . Dyspnea on exertion 09/28/2017  . Hypoxia 09/28/2017  . Meningioma ([place]) 09/07/2017  . FATIGUE 09/12/2009  . MYALGIA 10/31/2008  . Exertional dyspnea 10/31/2008  . Hyperlipidemia 11/11/2006  . OTHER SPECIFIED ANEMIAS 11/11/2006  . GERD 11/11/2006  . Osteoporosis 11/11/2006  . IRRITABLE BOWEL SYNDROME, HX OF 10/20/2006    Past Medical History:  Diagnosis Date  . Cancer ([place])    skin cancer   . History of radiation therapy 01/09/19- 01/19/19   SRS brain radiation. 5 fractions to total 25 Gy  . Hyperlipidemia   . Hypertension   . IBS (irritable bowel syndrome)   . Osteoporosis   .  PONV (postoperative nausea and vomiting)    with breast surgery  . SOBOE (shortness of breath on exertion)     Past Surgical History:  Procedure Laterality Date  . APPLICATION OF CRANIAL NAVIGATION N/A 09/07/2017   Procedure: APPLICATION OF CRANIAL NAVIGATION;  Surgeon: Kary Kos, MD;  Location: [facility];  Service: Neurosurgery;  Laterality: N/A;  . APPLICATION OF CRANIAL NAVIGATION Bilateral 11/09/2019   Procedure:  APPLICATION OF CRANIAL NAVIGATION;  Surgeon: Kary Kos, MD;  Location: [facility];  Service: Neurosurgery;  Laterality: Bilateral;  . BRAIN SURGERY  09/2017  . BUNIONECTOMY    . COLONOSCOPY     [facility] Dr Amedeo Plenty probably about 10 years ago  . CRANIOTOMY N/A 09/07/2017   Procedure: CRAINIOTOMY BI CORONAL w/BRAINLAB;  Surgeon: Kary Kos, MD;  Location: [facility];  Service: Neurosurgery;  Laterality: N/A;  . CRANIOTOMY Bilateral 11/09/2019   Procedure: Craniotomy - bilateral - Frontal stereotactic;  Surgeon: Kary Kos, MD;  Location: [facility];  Service: Neurosurgery;  Laterality: Bilateral;  . EYE SURGERY Bilateral    cataract surgery  . SKIN CANCER EXCISION    . WISDOM TOOTH EXTRACTION      Social History   Tobacco Use  . Smoking status: Never Smoker  . Smokeless tobacco: Never Used  Vaping Use  . Vaping Use: Never used  Substance Use Topics  . Alcohol use: No  . Drug use: No    Family History  Problem Relation Age of Onset  . Breast cancer Mother   . Heart disease Father   . Colon cancer Maternal Aunt     Allergies  Allergen Reactions  . Bee Venom Swelling and Other (See Comments)    Site of swelling not recalled by family- happened during childhood  . Losartan Itching    Medication list has been reviewed and updated.  Current Outpatient Medications on File Prior to Visit  Medication Sig Dispense Refill  . acetaminophen (TYLENOL) 325 MG tablet Take 325-650 mg by mouth every 6 (six) hours as needed for mild pain or headache.    . Calcium 500-100 MG-UNIT CHEW Chew 1 tablet by mouth daily.     Marland Kitchen dexamethasone (DECADRON) 4 MG tablet Take 1 tablet (4 mg total) by mouth 2 (two) times daily. 60 tablet 0  . ezetimibe (ZETIA) 10 MG tablet Take 1 tablet (10 mg total) by mouth daily. 90 tablet 1  . ibuprofen (ADVIL) 200 MG tablet Take 200-400 mg by mouth every 6 (six) hours as needed for headache or mild pain.     Marland Kitchen levETIRAcetam (KEPPRA) 500 MG tablet Take 1 tablet (500 mg total) by mouth  2 (two) times daily. 60 tablet 0  . Prenatal MV & Min w/FA-DHA (PRENATAL ADULT GUMMY/DHA/FA PO) Take 1 tablet by mouth daily.      No current facility-administered medications on file prior to visit.    Review of Systems:  As per HPI- otherwise negative.   Physical Examination: Vitals:   11/19/19 1421  BP: 124/80  Pulse: 97  Resp: 20  Temp: 98.3 F (36.8 C)  SpO2: 97%   Vitals:   11/19/19 1421  Weight: 167 lb 6.4 oz (75.9 kg)   Body mass index is 30.62 kg/m. Ideal Body Weight:    GEN: no acute distress.  Overweight, looks well.  Accompanied today by her gentleman friend [name] HEENT: Atraumatic, Normocephalic.  Ears and Nose: No external deformity. CV: RRR, No M/G/R. No JVD. No thrill. No extra heart sounds. PULM: CTA B, no  wheezes, crackles, rhonchi. No retractions. No resp. distress. No accessory muscle use. EXTR: No c/c.  She has some swelling of both ankles, calves are normal. PSYCH: Normally interactive. Conversant.  She has a large sutured incision over the midline of her scalp, this appears to be healing well and is in good repair.  There is some bruising under her brow and nasal bridge which is drained from her operative incision  Wt Readings from Last 3 Encounters:  11/19/19 167 lb 6.4 oz (75.9 kg)  11/09/19 160 lb (72.6 kg)  10/22/19 103 lb (46.7 kg)   Assessment and Plan: Hospital discharge follow-up  Medication monitoring encounter  Hypocalcemia - Plan: Basic metabolic panel  Acute blood loss anemia - Plan: CBC  Lower extremity edema - Plan: B Nat Peptide  Jazari is here today to follow-up from recent hospital admission for recurrent meningioma resection.  She is overall doing well.  Her main concern is that she feels quite tired, this is making it difficult for her to do her physical therapy.   Certainly a lot of her fatigue may be from recent major surgery.  We will check a blood count, although her hemoglobin looked pretty good postop 11.5 I did  notice her calcium was low, we will recheck this today She has lower extremity edema, suspect this is due to venous insufficiency, steroid use and lack of activity.  Will check BNP  The patient and her boyfriend wonder about when she can come off Keppra.  I am not sure, she may need to consult with neurology.  I will get in touch with her neurologist and neurosurgeon about this issue Will plan further follow- up pending labs. Assuming well, asked her to see me in 3 to This visit occurred during the SARS-CoV-2 public health emergency.  Safety protocols were in place, including screening questions prior to the visit, additional usage of staff PPE, and extensive cleaning of exam room while observing appropriate contact time as indicated for disinfecting solutions.    Signed Lamar Blinks, MD  Addendum 11/16, received her labs as below Give her a call, let her know that labs overall look very good Calcium is back to normal Leukocytosis persists, this is likely due to steroids BNP negative  Also shared advice from her neurologist, Dr. Leta Baptist as below Phylliss Bob, patient should follow up with Dr. Mickeal Skinner (neuro-oncology) and Dr. Saintclair Halsted. I think she should stay on keppra for now (based on hospital notes). Thank you. -VRP     Results for orders placed or performed in visit on 11/19/19  CBC  Result Value Ref Range   WBC 15.2 (H) 4.0 - 10.5 K/uL   RBC 3.71 (L) 3.87 - 5.11 Mil/uL   Platelets 212.0 150 - 400 K/uL   Hemoglobin 12.0 12.0 - 15.0 g/dL   HCT 36.1 36 - 46 %   MCV 97.2 78.0 - 100.0 fl   MCHC 33.2 30.0 - 36.0 g/dL   RDW 14.4 11.5 - 56.8 %  Basic metabolic panel  Result Value Ref Range   Sodium 138 135 - 145 mEq/L   Potassium 4.9 3.5 - 5.1 mEq/L   Chloride 99 96 - 112 mEq/L   CO2 30 19 - 32 mEq/L   Glucose, Bld 108 (H) 70 - 99 mg/dL   BUN 26 (H) 6 - 23 mg/dL   Creatinine, Ser 0.82 0.40 - 1.20 mg/dL   GFR 71.73 >60.00 mL/min   Calcium 9.0 8.4 - 10.5 mg/dL  B Nat Peptide  Result Value Ref Range   Pro B Natriuretic peptide (BNP) 96.0 0.0 - 100.0 pg/mL

## 2019-11-16 NOTE — Telephone Encounter (Signed)
Verbal orders given on secure voicemail.

## 2019-11-19 ENCOUNTER — Inpatient Hospital Stay: Payer: Medicare Other

## 2019-11-19 ENCOUNTER — Encounter: Payer: Self-pay | Admitting: Family Medicine

## 2019-11-19 ENCOUNTER — Other Ambulatory Visit: Payer: Self-pay

## 2019-11-19 ENCOUNTER — Ambulatory Visit (INDEPENDENT_AMBULATORY_CARE_PROVIDER_SITE_OTHER): Payer: Medicare Other | Admitting: Family Medicine

## 2019-11-19 VITALS — BP 124/80 | HR 97 | Temp 98.3°F | Resp 20 | Wt 167.4 lb

## 2019-11-19 DIAGNOSIS — Z5181 Encounter for therapeutic drug level monitoring: Secondary | ICD-10-CM

## 2019-11-19 DIAGNOSIS — R600 Localized edema: Secondary | ICD-10-CM | POA: Diagnosis not present

## 2019-11-19 DIAGNOSIS — Z09 Encounter for follow-up examination after completed treatment for conditions other than malignant neoplasm: Secondary | ICD-10-CM

## 2019-11-19 DIAGNOSIS — D62 Acute posthemorrhagic anemia: Secondary | ICD-10-CM

## 2019-11-20 LAB — CBC
HCT: 36.1 % (ref 36.0–46.0)
Hemoglobin: 12 g/dL (ref 12.0–15.0)
MCHC: 33.2 g/dL (ref 30.0–36.0)
MCV: 97.2 fl (ref 78.0–100.0)
Platelets: 212 10*3/uL (ref 150.0–400.0)
RBC: 3.71 Mil/uL — ABNORMAL LOW (ref 3.87–5.11)
RDW: 14.4 % (ref 11.5–15.5)
WBC: 15.2 10*3/uL — ABNORMAL HIGH (ref 4.0–10.5)

## 2019-11-20 LAB — BASIC METABOLIC PANEL
BUN: 26 mg/dL — ABNORMAL HIGH (ref 6–23)
CO2: 30 mEq/L (ref 19–32)
Calcium: 9 mg/dL (ref 8.4–10.5)
Chloride: 99 mEq/L (ref 96–112)
Creatinine, Ser: 0.82 mg/dL (ref 0.40–1.20)
GFR: 71.73 mL/min (ref >60.00)
Glucose, Bld: 108 mg/dL — ABNORMAL HIGH (ref 70–99)
Potassium: 4.9 mEq/L (ref 3.5–5.1)
Sodium: 138 mEq/L (ref 135–145)

## 2019-11-20 LAB — BRAIN NATRIURETIC PEPTIDE: Pro B Natriuretic peptide (BNP): 96 pg/mL (ref 0.0–100.0)

## 2019-11-26 ENCOUNTER — Inpatient Hospital Stay: Payer: Medicare Other | Attending: Internal Medicine

## 2019-11-26 DIAGNOSIS — Z803 Family history of malignant neoplasm of breast: Secondary | ICD-10-CM | POA: Insufficient documentation

## 2019-11-26 DIAGNOSIS — Z85828 Personal history of other malignant neoplasm of skin: Secondary | ICD-10-CM | POA: Insufficient documentation

## 2019-11-26 DIAGNOSIS — R531 Weakness: Secondary | ICD-10-CM | POA: Insufficient documentation

## 2019-11-26 DIAGNOSIS — Z800 Family history of malignant neoplasm of digestive organs: Secondary | ICD-10-CM | POA: Insufficient documentation

## 2019-11-26 DIAGNOSIS — R609 Edema, unspecified: Secondary | ICD-10-CM | POA: Insufficient documentation

## 2019-11-26 DIAGNOSIS — Z7901 Long term (current) use of anticoagulants: Secondary | ICD-10-CM | POA: Insufficient documentation

## 2019-11-26 DIAGNOSIS — Z8249 Family history of ischemic heart disease and other diseases of the circulatory system: Secondary | ICD-10-CM | POA: Insufficient documentation

## 2019-11-26 DIAGNOSIS — R519 Headache, unspecified: Secondary | ICD-10-CM | POA: Insufficient documentation

## 2019-11-26 DIAGNOSIS — D320 Benign neoplasm of cerebral meninges: Secondary | ICD-10-CM | POA: Insufficient documentation

## 2019-11-26 DIAGNOSIS — Z79899 Other long term (current) drug therapy: Secondary | ICD-10-CM | POA: Insufficient documentation

## 2019-11-26 DIAGNOSIS — R5383 Other fatigue: Secondary | ICD-10-CM | POA: Insufficient documentation

## 2019-11-26 DIAGNOSIS — R0989 Other specified symptoms and signs involving the circulatory and respiratory systems: Secondary | ICD-10-CM | POA: Insufficient documentation

## 2019-11-26 DIAGNOSIS — R0602 Shortness of breath: Secondary | ICD-10-CM | POA: Insufficient documentation

## 2019-11-26 LAB — SURGICAL PATHOLOGY

## 2019-11-30 DIAGNOSIS — Z483 Aftercare following surgery for neoplasm: Secondary | ICD-10-CM | POA: Diagnosis not present

## 2019-11-30 DIAGNOSIS — Z9181 History of falling: Secondary | ICD-10-CM | POA: Diagnosis not present

## 2019-11-30 DIAGNOSIS — G40909 Epilepsy, unspecified, not intractable, without status epilepticus: Secondary | ICD-10-CM | POA: Diagnosis not present

## 2019-11-30 DIAGNOSIS — D320 Benign neoplasm of cerebral meninges: Secondary | ICD-10-CM | POA: Diagnosis not present

## 2019-11-30 DIAGNOSIS — Z85828 Personal history of other malignant neoplasm of skin: Secondary | ICD-10-CM | POA: Diagnosis not present

## 2019-11-30 DIAGNOSIS — Z7952 Long term (current) use of systemic steroids: Secondary | ICD-10-CM | POA: Diagnosis not present

## 2019-11-30 DIAGNOSIS — E785 Hyperlipidemia, unspecified: Secondary | ICD-10-CM | POA: Diagnosis not present

## 2019-11-30 DIAGNOSIS — K589 Irritable bowel syndrome without diarrhea: Secondary | ICD-10-CM | POA: Diagnosis not present

## 2019-11-30 DIAGNOSIS — M810 Age-related osteoporosis without current pathological fracture: Secondary | ICD-10-CM | POA: Diagnosis not present

## 2019-11-30 DIAGNOSIS — I10 Essential (primary) hypertension: Secondary | ICD-10-CM | POA: Diagnosis not present

## 2019-12-03 ENCOUNTER — Other Ambulatory Visit: Payer: Self-pay

## 2019-12-03 ENCOUNTER — Inpatient Hospital Stay: Payer: Medicare Other | Admitting: Internal Medicine

## 2019-12-03 ENCOUNTER — Encounter (HOSPITAL_COMMUNITY): Payer: Self-pay

## 2019-12-03 ENCOUNTER — Other Ambulatory Visit: Payer: Self-pay | Admitting: *Deleted

## 2019-12-03 ENCOUNTER — Other Ambulatory Visit: Payer: Self-pay | Admitting: Radiation Therapy

## 2019-12-03 VITALS — BP 142/94 | HR 112 | Temp 97.7°F | Resp 18 | Ht 62.0 in | Wt 171.5 lb

## 2019-12-03 DIAGNOSIS — D329 Benign neoplasm of meninges, unspecified: Secondary | ICD-10-CM | POA: Diagnosis not present

## 2019-12-03 DIAGNOSIS — R0989 Other specified symptoms and signs involving the circulatory and respiratory systems: Secondary | ICD-10-CM | POA: Diagnosis not present

## 2019-12-03 DIAGNOSIS — R5383 Other fatigue: Secondary | ICD-10-CM | POA: Diagnosis not present

## 2019-12-03 DIAGNOSIS — Z800 Family history of malignant neoplasm of digestive organs: Secondary | ICD-10-CM | POA: Diagnosis not present

## 2019-12-03 DIAGNOSIS — Z803 Family history of malignant neoplasm of breast: Secondary | ICD-10-CM | POA: Diagnosis not present

## 2019-12-03 DIAGNOSIS — R079 Chest pain, unspecified: Secondary | ICD-10-CM

## 2019-12-03 DIAGNOSIS — D320 Benign neoplasm of cerebral meninges: Secondary | ICD-10-CM | POA: Diagnosis not present

## 2019-12-03 DIAGNOSIS — I208 Other forms of angina pectoris: Secondary | ICD-10-CM

## 2019-12-03 DIAGNOSIS — R519 Headache, unspecified: Secondary | ICD-10-CM | POA: Diagnosis not present

## 2019-12-03 DIAGNOSIS — R531 Weakness: Secondary | ICD-10-CM | POA: Diagnosis not present

## 2019-12-03 DIAGNOSIS — Z7901 Long term (current) use of anticoagulants: Secondary | ICD-10-CM | POA: Diagnosis not present

## 2019-12-03 DIAGNOSIS — R609 Edema, unspecified: Secondary | ICD-10-CM | POA: Diagnosis not present

## 2019-12-03 DIAGNOSIS — Z8249 Family history of ischemic heart disease and other diseases of the circulatory system: Secondary | ICD-10-CM | POA: Diagnosis not present

## 2019-12-03 DIAGNOSIS — R569 Unspecified convulsions: Secondary | ICD-10-CM

## 2019-12-03 DIAGNOSIS — R0602 Shortness of breath: Secondary | ICD-10-CM | POA: Diagnosis not present

## 2019-12-03 DIAGNOSIS — Z85828 Personal history of other malignant neoplasm of skin: Secondary | ICD-10-CM | POA: Diagnosis not present

## 2019-12-03 DIAGNOSIS — Z79899 Other long term (current) drug therapy: Secondary | ICD-10-CM | POA: Diagnosis not present

## 2019-12-03 NOTE — Progress Notes (Signed)
[facility] [place], [place] [address] [phone]   Interval Evaluation  Date of Service: 12/03/19 Patient Name: [name] [name] Patient MRN: [id number] Patient DOB: 08-24-1945 Provider: Ventura Sellers, MD  Identifying Statement:  [name] [name] is a 74 y.o. 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 Saintclair Halsted.  Path is WHO grade I meningioma 01/19/19: fx SRS with Dr. Isidore Moos 25/5 11/09/19: Progression, repeat craniotomy, L frontal w/ Dr. Saintclair Halsted; path is grade III meningioma  Interval History:  [name] [name] presents today following recent hospitalization for left frontal craniotomy for resection of progressive meningioma.  Since surgery, she complains of significant fatigue, lethargy, and notable shortness of breath, even with minor exertion.  Since starting steroids she has weakness of both legs, lots of swelling of her legs and face, and at least 15 lbs of weight gain.  Fortunately no recurrence of seizures since before hospital visit, continues on Keppra 500mg  twice per day.  Decadron dose is currently 2mg  twice per day after having been on 4mg  twice per day.  Also complains of "shakiness" all over, in particular with hands when she is experiencing shortness of breath.  Otherwise denies pain.  H+P (10/19/17) Patient presented to medical attention initially in August after complaints of loss of short term memory, depressed affect, and new onset headaches.  MRI demonstrated 2 lesions c/w meningioma, one in the left frontal lobe and another tumor in the left cavernous sinus and orbital apex.  She denied visual symptoms including double vision.  Dr. Saintclair Halsted resected the left frontal tumor on 09/07/17, and following surgery she described clinical improvement.  Her memory is still "not 100%" but she feels more like her "normal self".  Patient and  husband deny any behavioral sytmpoms or seizures.  Headaches have resolved.  Unfortunately 2-3 weeks after surgery she developed shortness of breath and was found to have a pulmonary embolism.  She was started on Eliquis and has been taking PRN oxygen, although respiratory symptoms have improved dramatically.    Medications: Current Outpatient Medications on File Prior to Visit  Medication Sig Dispense Refill  . acetaminophen (TYLENOL) 325 MG tablet Take 325-650 mg by mouth every 6 (six) hours as needed for mild pain or headache.    . Calcium 500-100 MG-UNIT CHEW Chew 1 tablet by mouth daily.     Marland Kitchen ezetimibe (ZETIA) 10 MG tablet Take 1 tablet (10 mg total) by mouth daily. 90 tablet 1  . ibuprofen (ADVIL) 200 MG tablet Take 200-400 mg by mouth every 6 (six) hours as needed for headache or mild pain.     Marland Kitchen levETIRAcetam (KEPPRA) 500 MG tablet Take 1 tablet (500 mg total) by mouth 2 (two) times daily. 60 tablet 0  . Prenatal MV & Min w/FA-DHA (PRENATAL ADULT GUMMY/DHA/FA PO) Take 1 tablet by mouth daily.      No current facility-administered medications on file prior to visit.    Allergies:  Allergies  Allergen Reactions  . Bee Venom Swelling and Other (See Comments)    Site of swelling not recalled by family- happened during childhood  . Losartan Itching   Past Medical History:  Past Medical History:  Diagnosis Date  . Cancer ([place])    skin cancer   . History of radiation therapy 01/09/19- 01/19/19   SRS brain radiation. 5 fractions to total 25 Gy  . Hyperlipidemia   . Hypertension   .  IBS (irritable bowel syndrome)   . Osteoporosis   . PONV (postoperative nausea and vomiting)    with breast surgery  . SOBOE (shortness of breath on exertion)    Past Surgical History:  Past Surgical History:  Procedure Laterality Date  . APPLICATION OF CRANIAL NAVIGATION N/A 09/07/2017   Procedure: APPLICATION OF CRANIAL NAVIGATION;  Surgeon: Kary Kos, MD;  Location: [facility];  Service: Neurosurgery;   Laterality: N/A;  . APPLICATION OF CRANIAL NAVIGATION Bilateral 11/09/2019   Procedure: APPLICATION OF CRANIAL NAVIGATION;  Surgeon: Kary Kos, MD;  Location: [facility];  Service: Neurosurgery;  Laterality: Bilateral;  . BRAIN SURGERY  09/2017  . BUNIONECTOMY    . COLONOSCOPY     [facility] Dr Amedeo Plenty probably about 10 years ago  . CRANIOTOMY N/A 09/07/2017   Procedure: CRAINIOTOMY BI CORONAL w/BRAINLAB;  Surgeon: Kary Kos, MD;  Location: [facility];  Service: Neurosurgery;  Laterality: N/A;  . CRANIOTOMY Bilateral 11/09/2019   Procedure: Craniotomy - bilateral - Frontal stereotactic;  Surgeon: Kary Kos, MD;  Location: [facility];  Service: Neurosurgery;  Laterality: Bilateral;  . EYE SURGERY Bilateral    cataract surgery  . SKIN CANCER EXCISION    . WISDOM TOOTH EXTRACTION     Social History:  Social History   Socioeconomic History  . Marital status: Single    Spouse name: Not on file  . Number of children: Not on file  . Years of education: Not on file  . Highest education level: Not on file  Occupational History  . Not on file  Tobacco Use  . Smoking status: Never Smoker  . Smokeless tobacco: Never Used  Vaping Use  . Vaping Use: Never used  Substance and Sexual Activity  . Alcohol use: No  . Drug use: No  . Sexual activity: Not on file  Other Topics Concern  . Not on file  Social History Narrative   Lives home with home.  Not working.  Education:  HS grad.  [name].     Social Determinants of Health   Financial Resource Strain:   . Difficulty of Paying Living Expenses: Not on file  Food Insecurity:   . Worried About Charity fundraiser in the Last Year: Not on file  . Ran Out of Food in the Last Year: Not on file  Transportation Needs:   . Lack of Transportation (Medical): Not on file  . Lack of Transportation (Non-Medical): Not on file  Physical Activity:   . Days of Exercise per Week: Not on file  . Minutes of Exercise per Session: Not on file  Stress:   .  Feeling of Stress : Not on file  Social Connections:   . Frequency of Communication with Friends and Family: Not on file  . Frequency of Social Gatherings with Friends and Family: Not on file  . Attends Religious Services: Not on file  . Active Member of Clubs or Organizations: Not on file  . Attends Archivist Meetings: Not on file  . Marital Status: Not on file  Intimate Partner Violence:   . Fear of Current or Ex-Partner: Not on file  . Emotionally Abused: Not on file  . Physically Abused: Not on file  . Sexually Abused: Not on file   Family History:  Family History  Problem Relation Age of Onset  . Breast cancer Mother   . Heart disease Father   . Colon cancer Maternal Aunt     Review of Systems: Constitutional:  Denies fevers, chills or abnormal weight loss Eyes: Denies blurriness of vision Ears, nose, mouth, throat, and face: Denies mucositis or sore throat Respiratory: + dyspnea Cardiovascular: Denies palpitation, chest discomfort or lower extremity swelling Gastrointestinal:  Denies nausea, constipation, diarrhea GU: Denies dysuria or incontinence Skin: Denies abnormal skin rashes Neurological: Per HPI Musculoskeletal: Denies joint pain, back or neck discomfort. No decrease in ROM Behavioral/Psych: Denies anxiety, disturbance in thought content, and mood instability  Physical Exam: Vitals:   12/03/19 1435  BP: (!) 142/94  Pulse: (!) 112  Resp: 18  Temp: 97.7 F (36.5 C)  SpO2: 96%   KPS: 70. General: Cushingoid appearance Head: Normal EENT: No conjunctival injection or scleral icterus. Oral mucosa moist Lungs: Resp effort increased, +WOB Cardiac: Tachycardic Abdomen: Non-distended abdomen Skin: No rashes cyanosis or petechiae. Extremities: ++edema pitting  Neurologic Exam: Mental Status: Awake, alert, attentive to examiner. Oriented to self and environment. Language fluency and comprehension normal. Cranial Nerves: Visual acuity is grossly  normal. Visual fields are full. Extra-ocular movements intact. No ptosis. Face is symmetric, tongue midline. Motor: Tone and bulk are normal. Power is 4/5 in right leg, 4+/5 in right arm. Also B/L hip girdle weakness. Poly-mini-myoclonus and asterixis noted. Reflexes are symmetric, no pathologic reflexes present. Intact finger to nose bilaterally Sensory: Intact to light touch and temperature Gait: Hemiparetic  Labs: I have reviewed the data as listed    Component Value Date/Time   NA 138 11/19/2019 1512   NA 141 07/15/2015 0000   NA 141 07/15/2015 0000   K 4.9 11/19/2019 1512   CL 99 11/19/2019 1512   CO2 30 11/19/2019 1512   GLUCOSE 108 (H) 11/19/2019 1512   BUN 26 (H) 11/19/2019 1512   BUN 22 (A) 07/15/2015 0000   BUN 22 (A) 07/15/2015 0000   CREATININE 0.82 11/19/2019 1512   CREATININE 0.84 12/26/2018 1217   CALCIUM 9.0 11/19/2019 1512   PROT 6.7 10/22/2019 1151   ALBUMIN 3.4 (L) 10/22/2019 1151   AST 30 10/22/2019 1151   ALT 31 10/22/2019 1151   ALKPHOS 51 10/22/2019 1151   BILITOT 0.9 10/22/2019 1151   GFRNONAA >60 11/10/2019 0430   GFRNONAA >60 12/26/2018 1217   GFRAA >60 12/26/2018 1217   Lab Results  Component Value Date   WBC 15.2 (H) 11/19/2019   NEUTROABS 15.6 (H) 10/24/2019   HGB 12.0 11/19/2019   HCT 36.1 11/19/2019   MCV 97.2 11/19/2019   PLT 212.0 11/19/2019   Imaging:  MR BRAIN W WO CONTRAST  Result Date: 11/10/2019 CLINICAL DATA:  Benign neoplasm, brain/CNS. Additional history provided: Post craniotomy benign neoplasm, brain/CNS. EXAM: MRI HEAD WITHOUT AND WITH CONTRAST TECHNIQUE: Multiplanar, multiecho pulse sequences of the brain and surrounding structures were obtained without and with intravenous contrast. CONTRAST:  53mL GADAVIST GADOBUTROL 1 MMOL/ML IV SOLN COMPARISON:  Brain MRI 11/03/2019. FINDINGS: Brain: Postoperative changes from interval resection of a left frontal region meningioma which had crossed midline. There is a small amount of  pneumocephalus within the resection cavity and overlying the frontal lobes. There are foci of restricted diffusion surrounding the resection cavity within the bilateral frontal lobes, likely reflecting devitalized tissue. Small amount of T1 hyperintense subacute blood products along the margins of the resection cavity. There is a 5 mm focus of subtly nodular dural thickening and enhancement overlying the left frontal lobe, which could reflect trace residual dural-based tumor (series 19, image 19). There is otherwise nonspecific smooth dural thickening and enhancement along the bilateral frontoparietal convexities. Prominent  vasogenic edema within the left greater than right cerebral hemispheres has not significantly changed. Unchanged extra-axial mass involving the left cavernous sinus, orbital apex, sella, suprasellar cistern, Meckel's cave and middle cranial fossa. As before, there is encasement and mild narrowing of the left ICA. Unchanged rightward deviation of the pituitary stalk. The mass surrounds the cisternal optic nerve and mildly uplifts the left aspect of the optic chiasm. Stable background mild multifocal T2/FLAIR hyperintensity within the cerebral white matter which is nonspecific, but compatible with chronic small vessel ischemic disease. Redemonstrated chronic microhemorrhage within the callosum splenium. No midline shift. Vascular: Expected proximal arterial flow voids. The anterior aspect of the superior sagittal sinus appears to have been sacrificed. New from prior examinations, there is fairly extensive thrombus within the left sigmoid sinus and left jugular bulb. Skull and upper cervical spine: Frontoparietal cranioplasty. Sinuses/Orbits: Visualized orbits show no acute finding. Mild left posterior ethmoid mucosal thickening. Impressipn #5 will be called to the ordering clinician or representative by the Radiologist Assistant, and communication documented in the PACS or [employer].  IMPRESSION: 1. Postoperative changes from interval resection of a left frontal region meningioma, which had previously crossed midline. There appears to have been essentially a gross total resection. However, there is a subtle 5 mm nodular focus of dural thickening along the left frontal lobe, which could reflect trace residual dural-based tumor. 2. Foci of restricted diffusion within bilateral frontal lobes, likely reflecting devitalized tissue. 3. Prominent vasogenic edema within the left greater than right cerebral hemispheres, unchanged. 4. Unchanged meningioma centered in the left cavernous sinus region. 5. New from prior examinations, there is fairly extensive thrombus within the left sigmoid sinus and left jugular bulb. Electronically Signed   By: Kellie Simmering DO   On: 11/10/2019 11:56   Pathology: SURGICAL PATHOLOGY  CASE: [phone]  PATIENT: [name]  Surgical Pathology Report   Clinical History: Meningioma (cm)   FINAL MICROSCOPIC DIAGNOSIS:   AB. BRAIN MASS, PARASAGITTAL, RESECTION:  - Meningioma, CNS WHO grade 3, recurrent and progressive.   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 In a few  foci the tumor cell morphology is reminiscent of meningioma.   This case was seen in outside consultation at [facility] [id number]). The diagnosis and comment are reported here  verbatim. Please see outside pathology report for complete details.  Special stains and immunohistochemistry stains were performed and  interpreted at [facility].   INTRAOPERATIVE DIAGNOSIS:   A1. BRAIN MASS, PARASAGITTAL, RESECTION, FROZEN SECTION:     Spindle cell lesion.     Rapid intraoperative consult diagnosis rendered by Dr. Saralyn Pilar @  1341 11/09/2019.    Assessment/Plan 1. Meningioma ([place])  [name] [name] presents with clinical picture today consistent with likely  metabolic encephalopathy, plus cushingoid syndrome.  We suspect metabolic component is likely secondary to respiratory dysfunction, given clear work of breathing on exam today of unclear etiology.  Given prior history of post-operative pulmonary embolism, PE is high on differential; we will request STAT CT angio of the chest for evaluation.  Can re-initiate Eliquis if appropriate, otherwise will refer to PCP and/or pulmonology for further workup and evaluation.  Significant burden of edema and myopathy is likely related to corticosteroid dosing.  Recommended decreasing to 2mg  daily x4 days, then 1mg  daily x3 days, then stopping.  We reviewed pathology today which confirmed evolution to higher grade meningioma. Will repeat MRI in 1  month once comorbid issues today are improved or resolved, can consider further radiation to surgical bed at that time.  We will call her with results of the CT angio.  Otherwise, she will return to clinic as scheduled next month, following next MRI brain.    We appreciate the opportunity to participate in the care of Robinette Esters.    All questions were answered. The patient knows to call the clinic with any problems, questions or concerns. No barriers to learning were detected.  I have spent a total of 40 minutes of face-to-face and non-face-to-face time, excluding clinical staff time, preparing to see patient, ordering tests and/or medications, counseling the patient, and independently interpreting results and communicating results to the patient/family/caregiver    Ventura Sellers, MD Medical Director of Neuro-Oncology [facility] at [place] 12/03/19 2:30 PM

## 2019-12-04 ENCOUNTER — Ambulatory Visit (HOSPITAL_COMMUNITY)
Admission: RE | Admit: 2019-12-04 | Discharge: 2019-12-04 | Disposition: A | Discharge status: Home / Self Care | Payer: Medicare Other | Source: Ambulatory Visit | Attending: Internal Medicine | Admitting: Internal Medicine

## 2019-12-04 ENCOUNTER — Encounter (HOSPITAL_COMMUNITY): Payer: Self-pay

## 2019-12-04 ENCOUNTER — Other Ambulatory Visit (HOSPITAL_COMMUNITY): Payer: Self-pay | Admitting: Emergency Medicine

## 2019-12-04 ENCOUNTER — Other Ambulatory Visit: Payer: Self-pay

## 2019-12-04 ENCOUNTER — Emergency Department (HOSPITAL_COMMUNITY)
Admission: EM | Admit: 2019-12-04 | Discharge: 2019-12-04 | Disposition: A | Discharge status: Home / Self Care | Payer: Medicare Other | Attending: Emergency Medicine | Admitting: Emergency Medicine

## 2019-12-04 DIAGNOSIS — R9389 Abnormal findings on diagnostic imaging of other specified body structures: Secondary | ICD-10-CM | POA: Diagnosis present

## 2019-12-04 DIAGNOSIS — R079 Chest pain, unspecified: Secondary | ICD-10-CM

## 2019-12-04 DIAGNOSIS — I2694 Multiple subsegmental pulmonary emboli without acute cor pulmonale: Secondary | ICD-10-CM | POA: Insufficient documentation

## 2019-12-04 DIAGNOSIS — R5382 Chronic fatigue, unspecified: Secondary | ICD-10-CM | POA: Diagnosis not present

## 2019-12-04 DIAGNOSIS — Z79899 Other long term (current) drug therapy: Secondary | ICD-10-CM | POA: Insufficient documentation

## 2019-12-04 DIAGNOSIS — R600 Localized edema: Secondary | ICD-10-CM | POA: Insufficient documentation

## 2019-12-04 DIAGNOSIS — Z7982 Long term (current) use of aspirin: Secondary | ICD-10-CM | POA: Insufficient documentation

## 2019-12-04 DIAGNOSIS — Z85828 Personal history of other malignant neoplasm of skin: Secondary | ICD-10-CM | POA: Insufficient documentation

## 2019-12-04 DIAGNOSIS — R0602 Shortness of breath: Secondary | ICD-10-CM | POA: Insufficient documentation

## 2019-12-04 DIAGNOSIS — Z7901 Long term (current) use of anticoagulants: Secondary | ICD-10-CM | POA: Insufficient documentation

## 2019-12-04 DIAGNOSIS — J9811 Atelectasis: Secondary | ICD-10-CM | POA: Diagnosis not present

## 2019-12-04 DIAGNOSIS — I10 Essential (primary) hypertension: Secondary | ICD-10-CM | POA: Diagnosis not present

## 2019-12-04 DIAGNOSIS — I2699 Other pulmonary embolism without acute cor pulmonale: Secondary | ICD-10-CM | POA: Diagnosis not present

## 2019-12-04 LAB — BASIC METABOLIC PANEL
Anion gap: 10 (ref 5–15)
BUN: 22 mg/dL (ref 8–23)
CO2: 28 mmol/L (ref 22–32)
Calcium: 8.6 mg/dL — ABNORMAL LOW (ref 8.9–10.3)
Chloride: 101 mmol/L (ref 98–111)
Creatinine, Ser: 0.66 mg/dL (ref 0.44–1.00)
GFR, Estimated: >60 mL/min (ref >60)
Glucose, Bld: 78 mg/dL (ref 70–99)
Potassium: 3.7 mmol/L (ref 3.5–5.1)
Sodium: 139 mmol/L (ref 135–145)

## 2019-12-04 LAB — CBC WITH DIFFERENTIAL/PLATELET
Abs Immature Granulocytes: 0.45 10*3/uL — ABNORMAL HIGH (ref 0.00–0.07)
Basophils Absolute: 0.1 10*3/uL (ref 0.0–0.1)
Basophils Relative: 1 %
Eosinophils Absolute: 0.1 10*3/uL (ref 0.0–0.5)
Eosinophils Relative: 1 %
HCT: 36.3 % (ref 36.0–46.0)
Hemoglobin: 11.9 g/dL — ABNORMAL LOW (ref 12.0–15.0)
Immature Granulocytes: 4 %
Lymphocytes Relative: 10 %
Lymphs Abs: 1.1 10*3/uL (ref 0.7–4.0)
MCH: 33.1 pg (ref 26.0–34.0)
MCHC: 32.8 g/dL (ref 30.0–36.0)
MCV: 100.8 fL — ABNORMAL HIGH (ref 80.0–100.0)
Monocytes Absolute: 0.6 10*3/uL (ref 0.1–1.0)
Monocytes Relative: 6 %
Neutro Abs: 8.7 10*3/uL — ABNORMAL HIGH (ref 1.7–7.7)
Neutrophils Relative %: 78 %
Platelets: 194 10*3/uL (ref 150–400)
RBC: 3.6 MIL/uL — ABNORMAL LOW (ref 3.87–5.11)
RDW: 14.8 % (ref 11.5–15.5)
WBC: 11 10*3/uL — ABNORMAL HIGH (ref 4.0–10.5)
nRBC: 0.4 % — ABNORMAL HIGH (ref 0.0–0.2)

## 2019-12-04 LAB — BRAIN NATRIURETIC PEPTIDE: B Natriuretic Peptide: 65 pg/mL (ref 0.0–100.0)

## 2019-12-04 LAB — TROPONIN I (HIGH SENSITIVITY): Troponin I (High Sensitivity): 17 ng/L (ref <18)

## 2019-12-04 MED ORDER — APIXABAN (ELIQUIS) EDUCATION KIT FOR DVT/PE PATIENTS
PACK | Freq: Once | Status: AC
  Filled 2019-12-04: qty 1

## 2019-12-04 MED ORDER — IOHEXOL 350 MG/ML SOLN
100.0000 mL | Freq: Once | INTRAVENOUS | Status: AC | PRN
  Administered 2019-12-04: 63 mL via INTRAVENOUS

## 2019-12-04 MED ORDER — DEXAMETHASONE 2 MG PO TABS
2.0000 mg | ORAL_TABLET | Freq: Once | ORAL | Status: AC
  Administered 2019-12-04: 2 mg via ORAL
  Filled 2019-12-04: qty 1

## 2019-12-04 MED ORDER — APIXABAN 5 MG PO TABS: 5.0000 mg | ORAL_TABLET | Freq: Two times a day (BID) | ORAL | Status: DC

## 2019-12-04 MED ORDER — LEVETIRACETAM 500 MG PO TABS
500.0000 mg | ORAL_TABLET | Freq: Once | ORAL | Status: AC
  Administered 2019-12-04: 500 mg via ORAL
  Filled 2019-12-04: qty 1

## 2019-12-04 MED ORDER — APIXABAN 5 MG PO TABS
10.0000 mg | ORAL_TABLET | Freq: Two times a day (BID) | ORAL | Status: DC
  Administered 2019-12-04: 10 mg via ORAL
  Filled 2019-12-04: qty 2

## 2019-12-04 MED ORDER — APIXABAN (ELIQUIS) VTE STARTER PACK (10MG AND 5MG): ORAL_TABLET | ORAL | 0 refills | Status: DC

## 2019-12-04 MED FILL — ELIQUIS STARTER PACK 5 MG T: 5 | 30 days supply | Qty: 74 | Fill #0

## 2019-12-04 NOTE — Discharge Instructions (Signed)
Return for worsening shortness of breath, chest pain or if you feel like you might pass out.

## 2019-12-04 NOTE — ED Triage Notes (Signed)
Patient had a CT angio today and showed multiple PE's. Patient does c.o SOB x 2 weeks that has gotten progressively worse.

## 2019-12-04 NOTE — ED Notes (Signed)
Moshe Salis[facility] [phone]

## 2019-12-04 NOTE — Progress Notes (Signed)
314 pm TOC CM received call from pt's husband, stating their pharmacy does not have starter for Eliquis. [facility] and they do have starter Eliquis. Faxed 30 day free trial coupon to pharmacy. Provided pt's husband with address to pick up medication. Not sure if pt can use Eliquis 30 day, she has been on blood thinners in past. Will contact pharmacy to see if they are able to apply coupon. [place], [place] ED TOC CM [phone]

## 2019-12-04 NOTE — ED Provider Notes (Signed)
[facility] DEPT Provider Note   CSN: [id number] Arrival date & time: 12/04/19  0854     History Chief Complaint  Patient presents with  . abnormal CT    [name] [name] is a 74 y.o. female.  74 yo F with a cc of shortness of breath and fatigue.  Seen by her neurooncologist yesterday with concern for PE obtained a CT scan.  + for multiple PE without heart strain.  Sent to the ED for evaluation.   Patient with hx of PE, was on eliquis.  Bilateral leg swelling. No current CA per patient.   The history is provided by the patient.  Illness Severity:  Moderate Onset quality:  Gradual Duration:  3 days Timing:  Constant Progression:  Worsening Chronicity:  New Associated symptoms: fatigue and shortness of breath   Associated symptoms: no chest pain, no congestion, no fever, no headaches, no myalgias, no nausea, no rhinorrhea, no vomiting and no wheezing        Past Medical History:  Diagnosis Date  . Cancer ([place])    skin cancer   . History of radiation therapy 01/09/19- 01/19/19   SRS brain radiation. 5 fractions to total 25 Gy  . Hyperlipidemia   . Hypertension   . IBS (irritable bowel syndrome)   . Osteoporosis   . PONV (postoperative nausea and vomiting)    with breast surgery  . SOBOE (shortness of breath on exertion)     Patient Active Problem List   Diagnosis Date Noted  . Focal seizure ([place]) 12/03/2019  . Brain tumor (benign) ([place]) 11/09/2019  . Acute metabolic encephalopathy 11/65/7903  . AMS (altered mental status) 10/22/2019  . Meningioma, cerebral ([place]) 12/22/2018  . Acute pulmonary embolism ([place]) 09/28/2017  . Dyspnea on exertion 09/28/2017  . Hypoxia 09/28/2017  . Meningioma ([place]) 09/07/2017  . FATIGUE 09/12/2009  . MYALGIA 10/31/2008  . Exertional dyspnea 10/31/2008  . Hyperlipidemia 11/11/2006  . OTHER SPECIFIED ANEMIAS 11/11/2006  . GERD 11/11/2006  . Osteoporosis 11/11/2006  . IRRITABLE BOWEL SYNDROME, HX OF  10/20/2006    Past Surgical History:  Procedure Laterality Date  . APPLICATION OF CRANIAL NAVIGATION N/A 09/07/2017   Procedure: APPLICATION OF CRANIAL NAVIGATION;  Surgeon: Kary Kos, MD;  Location: [facility];  Service: Neurosurgery;  Laterality: N/A;  . APPLICATION OF CRANIAL NAVIGATION Bilateral 11/09/2019   Procedure: APPLICATION OF CRANIAL NAVIGATION;  Surgeon: Kary Kos, MD;  Location: [facility];  Service: Neurosurgery;  Laterality: Bilateral;  . BRAIN SURGERY  09/2017  . BUNIONECTOMY    . COLONOSCOPY     [facility] Dr Amedeo Plenty probably about 10 years ago  . CRANIOTOMY N/A 09/07/2017   Procedure: CRAINIOTOMY BI CORONAL w/BRAINLAB;  Surgeon: Kary Kos, MD;  Location: [facility];  Service: Neurosurgery;  Laterality: N/A;  . CRANIOTOMY Bilateral 11/09/2019   Procedure: Craniotomy - bilateral - Frontal stereotactic;  Surgeon: Kary Kos, MD;  Location: [facility];  Service: Neurosurgery;  Laterality: Bilateral;  . EYE SURGERY Bilateral    cataract surgery  . SKIN CANCER EXCISION    . WISDOM TOOTH EXTRACTION       OB History   No obstetric history on file.     Family History  Problem Relation Age of Onset  . Breast cancer Mother   . Heart disease Father   . Colon cancer Maternal Aunt     Social History   Tobacco Use  . Smoking status: Never Smoker  . Smokeless tobacco: Never Used  Vaping Use  .  Vaping Use: Never used  Substance Use Topics  . Alcohol use: No  . Drug use: No    Home Medications Prior to Admission medications   Medication Sig Start Date End Date Taking? Authorizing Provider  acetaminophen (TYLENOL) 325 MG tablet Take 325-650 mg by mouth every 6 (six) hours as needed for mild pain or headache.    [provider]  APIXABAN Arne Cleveland) VTE STARTER PACK ($RemoveBefor'10MG'QxPgoKmPotTc$  AND $Re'5MG'dmP$ ) Take as directed on package: start with two-$RemoveBefore'5mg'EwLnhbZLwrYGS$  tablets twice daily for 7 days. On day 8, switch to one-$RemoveBefor'5mg'alaykRaRwOWw$  tablet twice daily. 12/04/19   Deno Etienne, DO  aspirin EC 81 MG tablet Take 81 mg by mouth daily.  Swallow whole.    [provider]  Calcium 500-100 MG-UNIT CHEW Chew 1 tablet by mouth daily.     [provider]  dexamethasone (DECADRON) 2 MG tablet Take 2 mg by mouth 2 (two) times daily.    [provider]  ezetimibe (ZETIA) 10 MG tablet Take 1 tablet (10 mg total) by mouth daily. 08/01/19   Copland, Gay Filler, MD  ibuprofen (ADVIL) 200 MG tablet Take 200-400 mg by mouth every 6 (six) hours as needed for headache or mild pain.     [provider]  levETIRAcetam (KEPPRA) 500 MG tablet Take 1 tablet (500 mg total) by mouth 2 (two) times daily. 10/25/19   Little Ishikawa, MD  Prenatal MV & Min w/FA-DHA (PRENATAL ADULT GUMMY/DHA/FA PO) Take 1 tablet by mouth daily.     [provider]    Allergies    Bee venom and Losartan  Review of Systems   Review of Systems  Constitutional: Positive for fatigue. Negative for chills and fever.  HENT: Negative for congestion and rhinorrhea.   Eyes: Negative for redness and visual disturbance.  Respiratory: Positive for shortness of breath. Negative for wheezing.   Cardiovascular: Positive for leg swelling (bilateral). Negative for chest pain and palpitations.  Gastrointestinal: Negative for nausea and vomiting.  Genitourinary: Negative for dysuria and urgency.  Musculoskeletal: Negative for arthralgias and myalgias.  Skin: Negative for pallor and wound.  Neurological: Negative for dizziness and headaches.    Physical Exam Updated Vital Signs BP (!) 150/91   Pulse 84   Temp 98.5 F (36.9 C) (Oral)   Resp 16   Ht $R'5\' 2"'TE$  (1.575 m)   Wt 77.8 kg   SpO2 96%   BMI 31.37 kg/m   Physical Exam Vitals and nursing note reviewed.  Constitutional:      General: She is not in acute distress.    Appearance: She is well-developed. She is not diaphoretic.  HENT:     Head: Normocephalic and atraumatic.  Eyes:     Pupils: Pupils are equal, round, and reactive to light.  Cardiovascular:     Rate and  Rhythm: Normal rate and regular rhythm.     Heart sounds: No murmur heard.  No friction rub. No gallop.   Pulmonary:     Effort: Pulmonary effort is normal.     Breath sounds: No wheezing or rales.  Abdominal:     General: There is no distension.     Palpations: Abdomen is soft.     Tenderness: There is no abdominal tenderness.  Musculoskeletal:        General: No tenderness.     Cervical back: Normal range of motion and neck supple.     Right lower leg: Edema present.     Left lower leg: Edema present.  Comments: 2+ to BLE and lower abdomen  Skin:    General: Skin is warm and dry.  Neurological:     Mental Status: She is alert and oriented to person, place, and time.  Psychiatric:        Behavior: Behavior normal.     ED Results / Procedures / Treatments   Labs (all labs ordered are listed, but only abnormal results are displayed) Labs Reviewed  CBC WITH DIFFERENTIAL/PLATELET - Abnormal; Notable for the following components:      Result Value   WBC 11.0 (*)    RBC 3.60 (*)    Hemoglobin 11.9 (*)    MCV 100.8 (*)    nRBC 0.4 (*)    Neutro Abs 8.7 (*)    Abs Immature Granulocytes 0.45 (*)    All other components within normal limits  BASIC METABOLIC PANEL - Abnormal; Notable for the following components:   Calcium 8.6 (*)    All other components within normal limits  BRAIN NATRIURETIC PEPTIDE  TROPONIN I (HIGH SENSITIVITY)    EKG EKG Interpretation  Date/Time:  Tuesday December 04 2019 09:24:54 EST Ventricular Rate:  86 PR Interval:    QRS Duration: 75 QT Interval:  354 QTC Calculation: 424 R Axis:   35 Text Interpretation: Sinus rhythm No significant change since last tracing Confirmed by Deno Etienne [phone]) on 12/04/2019 9:46:58 AM   Radiology CT ANGIO CHEST PE W OR WO CONTRAST  Result Date: 12/04/2019 CLINICAL DATA:  Suspected pulmonary embolism. Shortness of breath, history of pulmonary emboli in the past. EXAM: CT ANGIOGRAPHY CHEST WITH CONTRAST  TECHNIQUE: Multidetector CT imaging of the chest was performed using the standard protocol during bolus administration of intravenous contrast. Multiplanar CT image reconstructions and MIPs were obtained to evaluate the vascular anatomy. CONTRAST:  23mL OMNIPAQUE IOHEXOL 350 MG/ML SOLN COMPARISON:  September 28, 2017 FINDINGS: Cardiovascular: Heart size is normal, stable compared to the prior study. Central pulmonary vasculature with 2.7 cm caliber also similar to the prior study. Acute segmental bilateral lower lobe pulmonary emboli, for instance on image 57 of series 4, 61 of series 4 in the LEFT lower lobe. Also with RIGHT lower lobe emboli. Scattered small segmental level emboli in the upper lobe as well (image 42, series 4) RIGHT upper lobe branches both anterior and posterior segmental level branches. Upper lobe embolus on image 33 of series 7 just beyond the lobar level extending into anterior division segmental and subsegmental branches. No central embolus. Also with RIGHT middle lobe branches showing small segmental level emboli. The RV to LV ratio is approximately 0.75. Aortic caliber is normal. Scattered atheromatous plaque in the thoracic aorta Mediastinum/Nodes: Thoracic inlet structures are normal. No axillary lymphadenopathy. No mediastinal lymphadenopathy. No hilar lymphadenopathy. Lungs/Pleura: Patchy areas of ground-glass in the lung bases with distribution that may follow the distribution of pulmonary emboli. No lobar level consolidative changes. No pleural effusion. Airways are patent. Upper Abdomen: Low-density lesion in the anterior LEFT hepatic lobe likely a cyst, unchanged from previous imaging. No acute upper abdominal process to the extent evaluated. Musculoskeletal: Peripherally calcified bilateral breast implants. Similar to previous examination. Spinal degenerative changes. No acute or destructive bone process. Review of the MIP images confirms the above findings. IMPRESSION: 1. Multiple  bilateral segmental level pulmonary emboli to upper and lower lobes slightly worse on the RIGHT than the LEFT. No central embolus. The RV to LV ratio is approximately 0.75, no CT signs of RIGHT heart strain at this time. Correlate with  clinical symptoms. Patchy areas of ground-glass attenuation likely reflects small areas of pulmonary infarct with superimposed atelectasis. 2. Signs of pulmonary arterial hypertension as before. 3. Aortic atherosclerosis. Critical Value/emergent results were called by telephone at the time of interpretation on 12/04/2019 at 8:41 am to provider [facility] [place] , who verbally acknowledged these results. The patient was also instructed, per his guidance, to present to the emergency department for further management. Aortic Atherosclerosis (ICD10-I70.0). Electronically Signed   By: Zetta Bills M.D.   On: 12/04/2019 08:43    Procedures Procedures (including critical care time)  Medications Ordered in ED Medications  apixaban (ELIQUIS) tablet 10 mg (10 mg Oral Given 12/04/19 1222)    Followed by  apixaban (ELIQUIS) tablet 5 mg (has no administration in time range)  levETIRAcetam (KEPPRA) tablet 500 mg (500 mg Oral Given 12/04/19 1032)  dexamethasone (DECADRON) tablet 2 mg (2 mg Oral Given 12/04/19 1032)  apixaban (ELIQUIS) Education Kit for DVT/PE patients ( Does not apply Given by Other 12/04/19 1224)    ED Course  I have reviewed the triage vital signs and the nursing notes.  Pertinent labs & imaging results that were available during my care of the patient were reviewed by me and considered in my medical decision making (see chart for details).    MDM Rules/Calculators/A&P                          74 yo F with a cc of fatigue and sob.  Going on for the past few days.  Seen by neurooncology found to have PE with outpatient CT scan.   No hypoxia or tachypnea.  Not tachycardic here.  Will obtain screening trop, BNP, ecg.  Reassess.   Troponin, BNP, EKG without  concerning finding.  I calculated the patient's PESI score.  I discussed with her that this is not yet standard of care.  I offered to admit her into the hospital which she is declining.  Based on her score she was an intermediate risk with a 3 to 7% chance of mortality.  I discussed with her that the recommendation typically was to place her into the hospital.  She understands this risk and likes to go home.  We will start Eliquis here.  12:39 PM:  I have discussed the diagnosis/risks/treatment options with the patient and believe the pt to be eligible for discharge home to follow-up with PCP, oncology. We also discussed returning to the ED immediately if new or worsening sx occur. We discussed the sx which are most concerning (e.g., sudden worsening pain, fever, inability to tolerate by mouth) that necessitate immediate return. Medications administered to the patient during their visit and any new prescriptions provided to the patient are listed below.  Medications given during this visit Medications  apixaban (ELIQUIS) tablet 10 mg (10 mg Oral Given 12/04/19 1222)    Followed by  apixaban (ELIQUIS) tablet 5 mg (has no administration in time range)  levETIRAcetam (KEPPRA) tablet 500 mg (500 mg Oral Given 12/04/19 1032)  dexamethasone (DECADRON) tablet 2 mg (2 mg Oral Given 12/04/19 1032)  apixaban (ELIQUIS) Education Kit for DVT/PE patients ( Does not apply Given by Other 12/04/19 1224)     The patient appears reasonably screen and/or stabilized for discharge and I doubt any other medical condition or other [facility] requiring further screening, evaluation, or treatment in the ED at this time prior to discharge.     Final Clinical Impression(s) / ED Diagnoses  Final diagnoses:  Multiple subsegmental pulmonary emboli without acute cor pulmonale ([place])    Rx / DC Orders ED Discharge Orders         Ordered    APIXABAN (ELIQUIS) VTE STARTER PACK ($RemoveBefor'10MG'rHfOrpCrcXlp$  AND $Re'5MG'nyx$ )        12/04/19 [place], [place], DO 12/04/19 1239

## 2019-12-07 ENCOUNTER — Telehealth: Payer: Self-pay

## 2019-12-07 NOTE — Telephone Encounter (Signed)
Noted  

## 2019-12-07 NOTE — Telephone Encounter (Signed)
Caprice Red, Occupational Therapist with [facility] called to report patient missed her OT visit today.  Patient declined visit.  Visits will resume next week.  Don's callback # is [phone].

## 2019-12-10 ENCOUNTER — Inpatient Hospital Stay: Payer: Medicare Other | Attending: Internal Medicine

## 2019-12-11 NOTE — Progress Notes (Addendum)
[name] at [facility] [address] [place], [name] [address] [phone]  Date:  12/12/2019   Name:  [name] [name]   DOB:  1945/08/10   MRN:  [id number]  PCP:  Darreld Mclean, MD    Chief Complaint: Follow-up (CT scan, taking eliquis bid) and Edema   History of Present Illness:  [name] [name] is a 74 y.o. very pleasant female patient who presents with the following:  In person follow-up visit today- History of cerebral meningioma, osteoporosis, hyperlipidemia, IBS, pulmonary embolism Patient has history of recurrent frontal meningioma.  She had original resection of her meningioma in September 2019.  More recently, she had a seizure and was found to have recurrent meningioma. She had repeat craniotomy on November 5  Followed up with me on November 15 at which point she was doing generally well  She saw her oncologist, Dr. Sherrilee Gilles in his office on November 29- he noted shortness of breath and ordered a CT angiogram Her CT angiogram did show pulmonary emboli, she was seen in the emergency room on November 30 and started back on Eliquis  She has developed significant LE edema for 2-3 weeks- getting worse  She just finished up her steroids earlier this week  She has noted SOB for several weeks- no improvement yet with blood thinner  No CP   They tried compression socks but they were painful for he She is elevating her legs  Her legs are not weeping  BP Readings from Last 3 Encounters:  12/12/19 118/82  12/04/19 (!) 158/99  12/03/19 (!) 142/94   Wt Readings from Last 3 Encounters:  12/12/19 171 lb 12.8 oz (77.9 kg)  12/04/19 171 lb 8 oz (77.8 kg)  12/03/19 171 lb 8 oz (77.8 kg)   Weight in March 156-she has gained some weight from steroid use, also likely from fluid retention  Patient Active Problem List   Diagnosis Date Noted  . Focal seizure ([place]) 12/03/2019  . Brain tumor (benign) ([place]) 11/09/2019  . Acute  metabolic encephalopathy 76/54/6503  . AMS (altered mental status) 10/22/2019  . Meningioma, cerebral ([place]) 12/22/2018  . Acute pulmonary embolism ([place]) 09/28/2017  . Dyspnea on exertion 09/28/2017  . Hypoxia 09/28/2017  . Meningioma ([place]) 09/07/2017  . FATIGUE 09/12/2009  . MYALGIA 10/31/2008  . Exertional dyspnea 10/31/2008  . Hyperlipidemia 11/11/2006  . OTHER SPECIFIED ANEMIAS 11/11/2006  . GERD 11/11/2006  . Osteoporosis 11/11/2006  . IRRITABLE BOWEL SYNDROME, HX OF 10/20/2006    Past Medical History:  Diagnosis Date  . Cancer ([place])    skin cancer   . History of radiation therapy 01/09/19- 01/19/19   SRS brain radiation. 5 fractions to total 25 Gy  . Hyperlipidemia   . Hypertension   . IBS (irritable bowel syndrome)   . Osteoporosis   . PONV (postoperative nausea and vomiting)    with breast surgery  . SOBOE (shortness of breath on exertion)     Past Surgical History:  Procedure Laterality Date  . APPLICATION OF CRANIAL NAVIGATION N/A 09/07/2017   Procedure: APPLICATION OF CRANIAL NAVIGATION;  Surgeon: Kary Kos, MD;  Location: [facility];  Service: Neurosurgery;  Laterality: N/A;  . APPLICATION OF CRANIAL NAVIGATION Bilateral 11/09/2019   Procedure: APPLICATION OF CRANIAL NAVIGATION;  Surgeon: Kary Kos, MD;  Location: [facility];  Service: Neurosurgery;  Laterality: Bilateral;  . BRAIN SURGERY  09/2017  . BUNIONECTOMY    . COLONOSCOPY  [facility] Dr Amedeo Plenty probably about 10 years ago  . CRANIOTOMY N/A 09/07/2017   Procedure: CRAINIOTOMY BI CORONAL w/BRAINLAB;  Surgeon: Kary Kos, MD;  Location: [facility];  Service: Neurosurgery;  Laterality: N/A;  . CRANIOTOMY Bilateral 11/09/2019   Procedure: Craniotomy - bilateral - Frontal stereotactic;  Surgeon: Kary Kos, MD;  Location: [facility];  Service: Neurosurgery;  Laterality: Bilateral;  . EYE SURGERY Bilateral    cataract surgery  . SKIN CANCER EXCISION    . WISDOM TOOTH EXTRACTION      Social History   Tobacco Use  . Smoking  status: Never Smoker  . Smokeless tobacco: Never Used  Vaping Use  . Vaping Use: Never used  Substance Use Topics  . Alcohol use: No  . Drug use: No    Family History  Problem Relation Age of Onset  . Breast cancer Mother   . Heart disease Father   . Colon cancer Maternal Aunt     Allergies  Allergen Reactions  . Bee Venom Swelling and Other (See Comments)    Site of swelling not recalled by family- happened during childhood  . Losartan Itching    Medication list has been reviewed and updated.  Current Outpatient Medications on File Prior to Visit  Medication Sig Dispense Refill  . acetaminophen (TYLENOL) 325 MG tablet Take 325-650 mg by mouth every 6 (six) hours as needed for mild pain or headache.    Marland Kitchen apixaban (ELIQUIS) 5 MG TABS tablet Take 5 mg by mouth 2 (two) times daily.    Marland Kitchen aspirin EC 81 MG tablet Take 81 mg by mouth daily. Swallow whole.    . ezetimibe (ZETIA) 10 MG tablet Take 1 tablet (10 mg total) by mouth daily. 90 tablet 1  . levETIRAcetam (KEPPRA) 500 MG tablet Take 1 tablet (500 mg total) by mouth 2 (two) times daily. 60 tablet 0  . Prenatal MV & Min w/FA-DHA (PRENATAL ADULT GUMMY/DHA/FA PO) Take 1 tablet by mouth daily.      No current facility-administered medications on file prior to visit.    Review of Systems:  As per HPI- otherwise negative.   Physical Examination: Vitals:   12/12/19 1413  BP: 118/82  Pulse: (!) 121  Resp: 17  SpO2: 97%   Vitals:   12/12/19 1413  Weight: 171 lb 12.8 oz (77.9 kg)  Height: 5\' 2"  (1.575 m)   Body mass index is 31.42 kg/m. Ideal Body Weight: Weight in (lb) to have BMI = 25: 136.4  GEN: no acute distress.  Does not appear acutely ill but not her normal self either.  Somewhat cushingoid appearance HEENT: Atraumatic, Normocephalic.  Ears and Nose: No external deformity. CV: Tachycardic with a pulse approximately 105, No M/G/R. No JVD. No thrill. No extra heart sounds. PULM: CTA B, no wheezes, crackles,  rhonchi. No retractions. No resp. distress. No accessory muscle use. EXTR: No c/c she has significant bilateral lower extremity swelling especially of the feet, with soft puffy edema.  No weeping PSYCH: Normally interactive. Conversant.   Pulse Readings from Last 3 Encounters:  12/12/19 (!) 121  12/04/19 87  12/03/19 (!) 112    Assessment and Plan: Medication monitoring encounter  Acute blood loss anemia  Meningioma (HCC)  Acute pulmonary embolism with acute cor pulmonale, unspecified pulmonary embolism type (HCC)  Lower extremity edema - Plan: DG Chest 2 View, furosemide (LASIX) 20 MG tablet, potassium chloride SA (KLOR-CON) 10 MEQ tablet  Patient today for a follow-up visit.  She recently  had a repeat meningioma resection complicated by pulmonary embolism.  She is back on anticoagulation at this time I called her oncologist and discussed her situation.  We are okay to gently diurese  Prescribed Lasix, take 1 or 2 tablets daily.  Potassium supplement to counteract potassium losses Will obtain a chest x-ray today to look for pulmonary edema Recent BNP and EKG in ER were both normal We will follow-up with patient pending her x-ray results Made an appointment to see her next week for follow-up--asked her to alert me if not doing well in the meantime This visit occurred during the SARS-CoV-2 public health emergency.  Safety protocols were in place, including screening questions prior to the visit, additional usage of staff PPE, and extensive cleaning of exam room while observing appropriate contact time as indicated for disinfecting solutions.    Signed Lamar Blinks, MD  Received her CXR results, called to share with pt- explained findings   DG Chest 2 View  Result Date: 12/12/2019 CLINICAL DATA:  Shortness of breath and bilateral leg swelling for the past 2 weeks. EXAM: CHEST - 2 VIEW COMPARISON:  CT chest dated December 04, 2019. Chest x-ray dated July 13, 2018. FINDINGS: The  heart size and mediastinal contours are within normal limits. Normal pulmonary vascularity. Streaky atelectasis in both lower lobes. Subtle subpleural density in the right upper lobe. No focal consolidation, pleural effusion, or pneumothorax. No acute osseous abnormality. IMPRESSION: 1. No evidence of pulmonary edema. 2. Subtle subpleural density in the right upper lobe, likely evolving pulmonary infarct given recent CTA findings. 3. Bibasilar atelectasis. Electronically Signed   By: Titus Dubin M.D.   On: 12/12/2019 15:48

## 2019-12-12 ENCOUNTER — Encounter: Payer: Self-pay | Admitting: Family Medicine

## 2019-12-12 ENCOUNTER — Ambulatory Visit (HOSPITAL_BASED_OUTPATIENT_CLINIC_OR_DEPARTMENT_OTHER)
Admission: RE | Admit: 2019-12-12 | Discharge: 2019-12-12 | Disposition: A | Discharge status: Home / Self Care | Payer: Medicare Other | Source: Ambulatory Visit | Attending: Family Medicine | Admitting: Family Medicine

## 2019-12-12 ENCOUNTER — Ambulatory Visit (INDEPENDENT_AMBULATORY_CARE_PROVIDER_SITE_OTHER): Payer: Medicare Other | Admitting: Family Medicine

## 2019-12-12 ENCOUNTER — Other Ambulatory Visit: Payer: Self-pay

## 2019-12-12 ENCOUNTER — Other Ambulatory Visit: Payer: Self-pay | Admitting: Family Medicine

## 2019-12-12 VITALS — BP 118/82 | HR 105 | Resp 17 | Ht 62.0 in | Wt 171.8 lb

## 2019-12-12 DIAGNOSIS — I2609 Other pulmonary embolism with acute cor pulmonale: Secondary | ICD-10-CM

## 2019-12-12 DIAGNOSIS — D329 Benign neoplasm of meninges, unspecified: Secondary | ICD-10-CM

## 2019-12-12 DIAGNOSIS — D62 Acute posthemorrhagic anemia: Secondary | ICD-10-CM

## 2019-12-12 DIAGNOSIS — R600 Localized edema: Secondary | ICD-10-CM

## 2019-12-12 DIAGNOSIS — Z5181 Encounter for therapeutic drug level monitoring: Secondary | ICD-10-CM

## 2019-12-12 DIAGNOSIS — J9811 Atelectasis: Secondary | ICD-10-CM | POA: Diagnosis not present

## 2019-12-12 MED ORDER — FUROSEMIDE 20 MG PO TABS: 20.0000 mg | ORAL_TABLET | Freq: Every day | ORAL | 3 refills | Status: DC

## 2019-12-12 MED ORDER — POTASSIUM CHLORIDE CRYS ER 10 MEQ PO TBCR: 10.0000 meq | EXTENDED_RELEASE_TABLET | Freq: Every day | ORAL | 0 refills | Status: DC

## 2019-12-12 NOTE — Patient Instructions (Signed)
It was good to see you again- I am sorry you are having such a hard time!  Please go to the ground floor to have a chest film, then you can go home We will have you use lasix 20 mg at breakfast, and a 2nd dose around lunch if needed for swelling Please take 1 potassium pill on the days that you take lasix  We will see you in about 10 days to check on you -sooner if you need me

## 2019-12-14 ENCOUNTER — Telehealth: Payer: Self-pay | Admitting: Family Medicine

## 2019-12-14 NOTE — Telephone Encounter (Signed)
Caller: Durwin Reges) Call back  # [phone]  Patient decline OT today Patient still having swelling (dont know where) and some shob  Need verbal to add a visit for next week

## 2019-12-14 NOTE — Telephone Encounter (Signed)
Verbal order given via detailed message.  

## 2019-12-20 ENCOUNTER — Other Ambulatory Visit: Payer: Self-pay | Admitting: Radiation Therapy

## 2019-12-21 NOTE — Patient Instructions (Addendum)
It was good to see you again today-  I will be in touch with your labs  Assuming your potasium and kidney function are ok we will go up on these dosages  Please stop by the ground floor for a chest x-ray today

## 2019-12-21 NOTE — Progress Notes (Addendum)
[facility] [place], [place], [place] [address] [phone]  Date:  12/24/2019   Name:  [name] [name]   DOB:  08/14/1945   MRN:  [id number]  PCP:  Darreld Mclean, MD    Chief Complaint: Follow-up (Swelling in feet,  furosemide not helping, discuss eliquis//)   History of Present Illness:  [name] [name] is a 74 y.o. very pleasant female patient who presents with the following:  Here today for short term follow-up today - seen by myself most recently on 12/8 Here today accompanied by her fianc [name] who is her main caretaker  History of cerebral meningioma, osteoporosis, hyperlipidemia, IBS, pulmonary embolism Patient has history of recurrent frontal meningioma.  She had original resection of her meningioma in September 2019.  More recently, she had a seizure and was found to have recurrent meningioma. 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- restated on Eliquis   At our last visit I gave her some lasix and potassium for LE edema Need to recheck K today  She is taking eliquis for her PE faithfully Would like to get covid booster today- we will get this done at our pharmacy downstairs today Flu vaccine done   Lab Results  Component Value Date   HGBA1C 6.1 (H) 11/10/2019   BP Readings from Last 3 Encounters:  12/24/19 128/80  12/12/19 118/82  12/04/19 (!) 158/99   Wt Readings from Last 3 Encounters:  12/24/19 169 lb (76.7 kg)  12/12/19 171 lb 12.8 oz (77.9 kg)  12/04/19 171 lb 8 oz (77.8 kg)   She is totally off steroids now- as of about 3 weeks now  She is taking lasix 1 BID (we think) recently- her partner notes that she sometimes get a bit confused about her medications She is using potassium we think once a day -10 mill equivalents  She notes that she continues to be short of breath.  This is not getting  worse but is not getting better as quickly as she would like They do have a home pulse ox and note that she stays above 95% typically She does gets winded easily with any activity  Ct angio done 11/30: IMPRESSION: 1. Multiple bilateral segmental level pulmonary emboli to upper and lower lobes slightly worse on the RIGHT than the LEFT. No central embolus. The RV to LV ratio is approximately 0.75, no CT signs of RIGHT heart strain at this time. Correlate with clinical symptoms. Patchy areas of ground-glass attenuation likely reflects small areas of pulmonary infarct with superimposed atelectasis. 2. Signs of pulmonary arterial hypertension as before. 3. Aortic atherosclerosis.   We also did a chest x-ray for her on December 8 IMPRESSION: 1. No evidence of pulmonary edema. 2. Subtle subpleural density in the right upper lobe, likely evolving pulmonary infarct given recent CTA findings. 3. Bibasilar atelectasis. Patient Active Problem List   Diagnosis Date Noted  . Focal seizure ([facility]) 12/03/2019  . Brain tumor (benign) ([facility]) 11/09/2019  . Acute metabolic encephalopathy 27/06/2374  . AMS (altered mental status) 10/22/2019  . Meningioma, cerebral ([facility]) 12/22/2018  . Acute pulmonary embolism ([facility]) 09/28/2017  . Dyspnea on exertion 09/28/2017  . Hypoxia 09/28/2017  . Meningioma ([facility]) 09/07/2017  . FATIGUE 09/12/2009  . MYALGIA 10/31/2008  . Exertional dyspnea 10/31/2008  . Hyperlipidemia 11/11/2006  . OTHER SPECIFIED ANEMIAS 11/11/2006  . GERD 11/11/2006  .  Osteoporosis 11/11/2006  . IRRITABLE BOWEL SYNDROME, HX OF 10/20/2006    Past Medical History:  Diagnosis Date  . Cancer ([facility])    skin cancer   . History of radiation therapy 01/09/19- 01/19/19   SRS brain radiation. 5 fractions to total 25 Gy  . Hyperlipidemia   . Hypertension   . IBS (irritable bowel syndrome)   . Osteoporosis   . PONV (postoperative nausea and vomiting)    with breast surgery  . SOBOE (shortness of  breath on exertion)     Past Surgical History:  Procedure Laterality Date  . APPLICATION OF CRANIAL NAVIGATION N/A 09/07/2017   Procedure: APPLICATION OF CRANIAL NAVIGATION;  Surgeon: Kary Kos, MD;  Location: [facility];  Service: Neurosurgery;  Laterality: N/A;  . APPLICATION OF CRANIAL NAVIGATION Bilateral 11/09/2019   Procedure: APPLICATION OF CRANIAL NAVIGATION;  Surgeon: Kary Kos, MD;  Location: [facility];  Service: Neurosurgery;  Laterality: Bilateral;  . BRAIN SURGERY  09/2017  . BUNIONECTOMY    . COLONOSCOPY     [facility] Dr Amedeo Plenty probably about 10 years ago  . CRANIOTOMY N/A 09/07/2017   Procedure: CRAINIOTOMY BI CORONAL w/BRAINLAB;  Surgeon: Kary Kos, MD;  Location: [facility];  Service: Neurosurgery;  Laterality: N/A;  . CRANIOTOMY Bilateral 11/09/2019   Procedure: Craniotomy - bilateral - Frontal stereotactic;  Surgeon: Kary Kos, MD;  Location: [facility];  Service: Neurosurgery;  Laterality: Bilateral;  . EYE SURGERY Bilateral    cataract surgery  . SKIN CANCER EXCISION    . WISDOM TOOTH EXTRACTION      Social History   Tobacco Use  . Smoking status: Never Smoker  . Smokeless tobacco: Never Used  Vaping Use  . Vaping Use: Never used  Substance Use Topics  . Alcohol use: No  . Drug use: No    Family History  Problem Relation Age of Onset  . Breast cancer Mother   . Heart disease Father   . Colon cancer Maternal Aunt     Allergies  Allergen Reactions  . Bee Venom Swelling and Other (See Comments)    Site of swelling not recalled by family- happened during childhood  . Losartan Itching    Medication list has been reviewed and updated.  Current Outpatient Medications on File Prior to Visit  Medication Sig Dispense Refill  . acetaminophen (TYLENOL) 325 MG tablet Take 325-650 mg by mouth every 6 (six) hours as needed for mild pain or headache.    Marland Kitchen aspirin EC 81 MG tablet Take 81 mg by mouth daily. Swallow whole.    . ezetimibe (ZETIA) 10 MG tablet Take 1 tablet (10 mg  total) by mouth daily. 90 tablet 1  . furosemide (LASIX) 20 MG tablet Take 1 tablet (20 mg total) by mouth daily. Take one in the am. May take a 2nd dose around noon if desired 30 tablet 3  . levETIRAcetam (KEPPRA) 500 MG tablet Take 1 tablet (500 mg total) by mouth 2 (two) times daily. 60 tablet 0  . potassium chloride (KLOR-CON) 10 MEQ tablet TAKE 1 TABLET BY MOUTH DAILY, ON DAYS THAT YOU USE THE LASIX 90 tablet 0  . Prenatal MV & Min w/FA-DHA (PRENATAL ADULT GUMMY/DHA/FA PO) Take 1 tablet by mouth daily.      No current facility-administered medications on file prior to visit.    Review of Systems:  As per HPI- otherwise negative.   Physical Examination: Vitals:   12/24/19 1435 12/24/19 1507  BP: 128/80   Pulse: Marland Kitchen)  108 90  Resp: 17   SpO2: 94%    Vitals:   12/24/19 1435  Weight: 169 lb (76.7 kg)  Height: 5\' 2"  (1.575 m)   Body mass index is 30.91 kg/m. Ideal Body Weight: Weight in (lb) to have BMI = 25: 136.4  GEN: no acute distress.  Sitting in wheelchair due to long walks my office but is able to get up and walk-we had her walk around the office, oxygen saturation 97% HEENT: Atraumatic, Normocephalic.  Ears and Nose: No external deformity. CV: RRR, No M/G/R. No JVD. No thrill. No extra heart sounds. PULM: CTA B, no wheezes, crackles, rhonchi. No retractions. No resp. distress. No accessory muscle use. ABD: S, NT, ND, +BS. No rebound. No HSM. EXTR: No c/c.  Bilateral soft pitting edema does look improved to me although she is not sure about degree of improvement improvement    PSYCH: Normally interactive. Conversant.     Assessment and Plan: Medication monitoring encounter - Plan: Basic metabolic panel  Acute pulmonary embolism with acute cor pulmonale, unspecified pulmonary embolism type ([facility]) - Plan: DG Chest 2 View  Lower extremity edema  Patient today for follow-up from recent diagnosis of postoperative pulmonary embolism She is taking Eliquis without  complications, we will refill for her today I presume she will be on this permanently, have sent message to her hematologist to confirm She is also having lower extremity edema, this does seem improved to me whether from use of Lasix or stopping her oral steroids.  I did recommend trying compression socks Await BMP, and then will plan to make adjustment to her Lasix and potassium They will need more potassium when we make adjustment- pharm only dispensed 30 days  Pt got covid booster today at [facility] continues to notice shortness of breath which is not much better yet.  Will obtain repeat chest film today This visit occurred during the SARS-CoV-2 public health emergency.  Safety protocols were in place, including screening questions prior to the visit, additional usage of staff PPE, and extensive cleaning of exam room while observing appropriate contact time as indicated for disinfecting solutions.    Signed Lamar Blinks, MD  Received chest film as below-patient does not have my chart, will need to call tomorrow DG Chest 2 View  Result Date: 12/24/2019 CLINICAL DATA:  Follow-up chest film 12/12/2019. Pulmonary infarct suspected. EXAM: CHEST - 2 VIEW COMPARISON:  Chest x-ray 12/12/2019, CT chest 12/04/2019 FINDINGS: The heart size and mediastinal contours are within normal limits. Biapical pleural/pulmonary scarring. Interval resolution of peripheral right upper lobe airspace opacity. Bibasilar compressive changes. No new focal consolidation. No pulmonary edema. No pleural effusion. No pneumothorax. No acute osseous abnormality. Multilevel degenerative changes of the spine with a dextrocurvature of the lumbar spine and compensatory levocurvature of the thoracolumbar spine. IMPRESSION: 1. Interval resolution of previously noted right upper lobe airspace opacity. 2. Persistent bibasilar compressive changes with otherwise no acute cardiopulmonary disease. Electronically Signed   By: Iven Finn M.D.   On: 12/24/2019 15:45   Called pt on 12/21 to go over results Chest film improved Potassium ok She actually feels like her ankles are looking better Continue lasix 20 1 or 2 daily as needed kcl 20 meq daily on days she uses lasix   Results for orders placed or performed in visit on 11/91/47  Basic metabolic panel  Result Value Ref Range   Sodium 144 135 - 145 mEq/L   Potassium 4.2 3.5 - 5.1 mEq/L  Chloride 103 96 - 112 mEq/L   CO2 32 19 - 32 mEq/L   Glucose, Bld 86 70 - 99 mg/dL   BUN 14 6 - 23 mg/dL   Creatinine, Ser 0.98 0.40 - 1.20 mg/dL   GFR 57.88 (L) >60.00 mL/min   Calcium 9.7 8.4 - 10.5 mg/dL    Received message from her oncologist about her blood thinner Khian Remo,  Yes, thanks for reaching out. I think because the VTE is recurrent she should stay on Eliquis unless a contraindication develops.   Thedore Mins

## 2019-12-24 ENCOUNTER — Other Ambulatory Visit (HOSPITAL_BASED_OUTPATIENT_CLINIC_OR_DEPARTMENT_OTHER): Payer: Self-pay | Admitting: Internal Medicine

## 2019-12-24 ENCOUNTER — Encounter: Payer: Self-pay | Admitting: Family Medicine

## 2019-12-24 ENCOUNTER — Ambulatory Visit: Payer: Medicare Other

## 2019-12-24 ENCOUNTER — Ambulatory Visit
Admission: RE | Admit: 2019-12-24 | Discharge: 2019-12-24 | Disposition: A | Discharge status: Home / Self Care | Payer: Medicare Other | Source: Ambulatory Visit | Attending: Family Medicine | Admitting: Family Medicine

## 2019-12-24 ENCOUNTER — Other Ambulatory Visit: Payer: Self-pay

## 2019-12-24 ENCOUNTER — Ambulatory Visit (INDEPENDENT_AMBULATORY_CARE_PROVIDER_SITE_OTHER): Payer: Medicare Other | Admitting: Family Medicine

## 2019-12-24 VITALS — BP 128/80 | HR 90 | Resp 17 | Ht 62.0 in | Wt 169.0 lb

## 2019-12-24 DIAGNOSIS — I2609 Other pulmonary embolism with acute cor pulmonale: Secondary | ICD-10-CM

## 2019-12-24 DIAGNOSIS — R600 Localized edema: Secondary | ICD-10-CM

## 2019-12-24 DIAGNOSIS — Z5181 Encounter for therapeutic drug level monitoring: Secondary | ICD-10-CM | POA: Diagnosis not present

## 2019-12-24 DIAGNOSIS — Z23 Encounter for immunization: Secondary | ICD-10-CM

## 2019-12-24 MED ORDER — APIXABAN 5 MG PO TABS
5.0000 mg | ORAL_TABLET | Freq: Two times a day (BID) | ORAL | 9 refills | Status: DC
Start: 2019-12-24 — End: 2020-01-31

## 2019-12-24 NOTE — Progress Notes (Signed)
   Covid-19 Vaccination Clinic  Name:  [name] [name]    MRN: [id number] DOB: 01-20-1945  12/24/2019  [name] was observed post Covid-19 immunization for 15 minutes without incident. She was provided with Vaccine Information Sheet and instruction to access the V-Safe system.   [name] was instructed to call 911 with any severe reactions post vaccine: Marland Kitchen Difficulty breathing  . Swelling of face and throat  . A fast heartbeat  . A bad rash all over body  . Dizziness and weakness   Immunizations Administered    Name Date Dose VIS Date Route   Pfizer COVID-19 Vaccine 12/24/2019  2:59 PM 0.3 mL 10/24/2019 Intramuscular   Manufacturer: [place]   Lot: [id number]   [place]: [id number]

## 2019-12-25 ENCOUNTER — Telehealth: Payer: Self-pay | Admitting: Family Medicine

## 2019-12-25 LAB — BASIC METABOLIC PANEL
BUN: 14 mg/dL (ref 6–23)
CO2: 32 mEq/L (ref 19–32)
Calcium: 9.7 mg/dL (ref 8.4–10.5)
Chloride: 103 mEq/L (ref 96–112)
Creatinine, Ser: 0.98 mg/dL (ref 0.40–1.20)
GFR: 57.88 mL/min — ABNORMAL LOW (ref >60.00)
Glucose, Bld: 86 mg/dL (ref 70–99)
Potassium: 4.2 mEq/L (ref 3.5–5.1)
Sodium: 144 mEq/L (ref 135–145)

## 2019-12-25 MED ORDER — FUROSEMIDE 20 MG PO TABS: 20.0000 mg | ORAL_TABLET | Freq: Every day | ORAL | 3 refills | Status: DC

## 2019-12-25 MED ORDER — POTASSIUM CHLORIDE CRYS ER 10 MEQ PO TBCR: EXTENDED_RELEASE_TABLET | ORAL | 1 refills | Status: DC

## 2019-12-25 NOTE — Addendum Note (Signed)
Addended by: Lamar Blinks C on: 12/25/2019 04:18 PM   Modules accepted: Orders

## 2019-12-25 NOTE — Telephone Encounter (Signed)
[name] (significant other) calling to let you know patient ankle swelling down significantly, she is also been urinating  Better.

## 2019-12-26 ENCOUNTER — Encounter: Payer: Self-pay | Admitting: Family Medicine

## 2019-12-26 MED FILL — PFIZER-BIONTECH COVID-19 VA: 30 | 21 days supply | Qty: 0 | Fill #0

## 2020-01-11 ENCOUNTER — Ambulatory Visit (HOSPITAL_COMMUNITY)
Admission: RE | Admit: 2020-01-11 | Discharge: 2020-01-11 | Disposition: A | Discharge status: Home / Self Care | Payer: Medicare Other | Source: Ambulatory Visit | Attending: Internal Medicine | Admitting: Internal Medicine

## 2020-01-11 ENCOUNTER — Other Ambulatory Visit: Payer: Self-pay

## 2020-01-11 DIAGNOSIS — R488 Other symbolic dysfunctions: Secondary | ICD-10-CM | POA: Diagnosis not present

## 2020-01-11 DIAGNOSIS — G9389 Other specified disorders of brain: Secondary | ICD-10-CM | POA: Diagnosis not present

## 2020-01-11 DIAGNOSIS — R569 Unspecified convulsions: Secondary | ICD-10-CM | POA: Diagnosis not present

## 2020-01-11 DIAGNOSIS — I5032 Chronic diastolic (congestive) heart failure: Secondary | ICD-10-CM | POA: Diagnosis not present

## 2020-01-11 DIAGNOSIS — G936 Cerebral edema: Secondary | ICD-10-CM | POA: Diagnosis not present

## 2020-01-11 DIAGNOSIS — R410 Disorientation, unspecified: Secondary | ICD-10-CM | POA: Diagnosis not present

## 2020-01-11 DIAGNOSIS — I2699 Other pulmonary embolism without acute cor pulmonale: Secondary | ICD-10-CM | POA: Diagnosis not present

## 2020-01-11 DIAGNOSIS — M810 Age-related osteoporosis without current pathological fracture: Secondary | ICD-10-CM | POA: Diagnosis present

## 2020-01-11 DIAGNOSIS — G40909 Epilepsy, unspecified, not intractable, without status epilepticus: Secondary | ICD-10-CM | POA: Diagnosis not present

## 2020-01-11 DIAGNOSIS — D496 Neoplasm of unspecified behavior of brain: Secondary | ICD-10-CM | POA: Diagnosis not present

## 2020-01-11 DIAGNOSIS — R000 Tachycardia, unspecified: Secondary | ICD-10-CM | POA: Diagnosis not present

## 2020-01-11 DIAGNOSIS — K219 Gastro-esophageal reflux disease without esophagitis: Secondary | ICD-10-CM | POA: Diagnosis present

## 2020-01-11 DIAGNOSIS — R471 Dysarthria and anarthria: Secondary | ICD-10-CM | POA: Diagnosis present

## 2020-01-11 DIAGNOSIS — N390 Urinary tract infection, site not specified: Secondary | ICD-10-CM | POA: Diagnosis not present

## 2020-01-11 DIAGNOSIS — Z7901 Long term (current) use of anticoagulants: Secondary | ICD-10-CM | POA: Diagnosis not present

## 2020-01-11 DIAGNOSIS — M6281 Muscle weakness (generalized): Secondary | ICD-10-CM | POA: Diagnosis not present

## 2020-01-11 DIAGNOSIS — Z515 Encounter for palliative care: Secondary | ICD-10-CM | POA: Diagnosis not present

## 2020-01-11 DIAGNOSIS — C700 Malignant neoplasm of cerebral meninges: Secondary | ICD-10-CM | POA: Diagnosis not present

## 2020-01-11 DIAGNOSIS — D329 Benign neoplasm of meninges, unspecified: Secondary | ICD-10-CM

## 2020-01-11 DIAGNOSIS — A419 Sepsis, unspecified organism: Secondary | ICD-10-CM | POA: Diagnosis not present

## 2020-01-11 DIAGNOSIS — Z86711 Personal history of pulmonary embolism: Secondary | ICD-10-CM | POA: Diagnosis not present

## 2020-01-11 DIAGNOSIS — G8191 Hemiplegia, unspecified affecting right dominant side: Secondary | ICD-10-CM | POA: Diagnosis not present

## 2020-01-11 DIAGNOSIS — B9620 Unspecified Escherichia coli [E. coli] as the cause of diseases classified elsewhere: Secondary | ICD-10-CM | POA: Diagnosis not present

## 2020-01-11 DIAGNOSIS — R29810 Facial weakness: Secondary | ICD-10-CM | POA: Diagnosis not present

## 2020-01-11 DIAGNOSIS — Z7189 Other specified counseling: Secondary | ICD-10-CM | POA: Diagnosis not present

## 2020-01-11 DIAGNOSIS — D320 Benign neoplasm of cerebral meninges: Secondary | ICD-10-CM | POA: Diagnosis not present

## 2020-01-11 DIAGNOSIS — R531 Weakness: Secondary | ICD-10-CM | POA: Diagnosis not present

## 2020-01-11 DIAGNOSIS — Z8249 Family history of ischemic heart disease and other diseases of the circulatory system: Secondary | ICD-10-CM | POA: Diagnosis not present

## 2020-01-11 DIAGNOSIS — G9340 Encephalopathy, unspecified: Secondary | ICD-10-CM | POA: Diagnosis not present

## 2020-01-11 DIAGNOSIS — G9341 Metabolic encephalopathy: Secondary | ICD-10-CM | POA: Diagnosis not present

## 2020-01-11 DIAGNOSIS — R4702 Dysphasia: Secondary | ICD-10-CM | POA: Diagnosis not present

## 2020-01-11 DIAGNOSIS — I10 Essential (primary) hypertension: Secondary | ICD-10-CM | POA: Diagnosis not present

## 2020-01-11 DIAGNOSIS — G4089 Other seizures: Secondary | ICD-10-CM | POA: Diagnosis not present

## 2020-01-11 DIAGNOSIS — Z20822 Contact with and (suspected) exposure to covid-19: Secondary | ICD-10-CM | POA: Diagnosis not present

## 2020-01-11 DIAGNOSIS — Z66 Do not resuscitate: Secondary | ICD-10-CM | POA: Diagnosis not present

## 2020-01-11 DIAGNOSIS — M2550 Pain in unspecified joint: Secondary | ICD-10-CM | POA: Diagnosis not present

## 2020-01-11 DIAGNOSIS — R2681 Unsteadiness on feet: Secondary | ICD-10-CM | POA: Diagnosis not present

## 2020-01-11 DIAGNOSIS — R627 Adult failure to thrive: Secondary | ICD-10-CM | POA: Diagnosis not present

## 2020-01-11 DIAGNOSIS — Z7401 Bed confinement status: Secondary | ICD-10-CM | POA: Diagnosis not present

## 2020-01-11 DIAGNOSIS — K589 Irritable bowel syndrome without diarrhea: Secondary | ICD-10-CM | POA: Diagnosis not present

## 2020-01-11 DIAGNOSIS — I110 Hypertensive heart disease with heart failure: Secondary | ICD-10-CM | POA: Diagnosis not present

## 2020-01-11 DIAGNOSIS — Z736 Limitation of activities due to disability: Secondary | ICD-10-CM | POA: Diagnosis not present

## 2020-01-11 DIAGNOSIS — E860 Dehydration: Secondary | ICD-10-CM | POA: Diagnosis present

## 2020-01-11 DIAGNOSIS — R4182 Altered mental status, unspecified: Secondary | ICD-10-CM | POA: Diagnosis not present

## 2020-01-11 DIAGNOSIS — R21 Rash and other nonspecific skin eruption: Secondary | ICD-10-CM | POA: Diagnosis not present

## 2020-01-11 DIAGNOSIS — E785 Hyperlipidemia, unspecified: Secondary | ICD-10-CM | POA: Diagnosis not present

## 2020-01-11 MED ORDER — GADOBUTROL 1 MMOL/ML IV SOLN
7.5000 mL | Freq: Once | INTRAVENOUS | Status: AC | PRN
  Administered 2020-01-11: 7.5 mL via INTRAVENOUS

## 2020-01-14 ENCOUNTER — Emergency Department (HOSPITAL_COMMUNITY): Payer: Medicare Other

## 2020-01-14 ENCOUNTER — Inpatient Hospital Stay (HOSPITAL_COMMUNITY)
Admission: EM | Admit: 2020-01-14 | Discharge: 2020-01-31 | DRG: 054 | Disposition: A | Discharge status: Skilled Nursing Facility | Payer: Medicare Other | Attending: Internal Medicine | Admitting: Internal Medicine

## 2020-01-14 ENCOUNTER — Inpatient Hospital Stay: Payer: Medicare Other | Attending: Internal Medicine

## 2020-01-14 ENCOUNTER — Telehealth: Payer: Self-pay

## 2020-01-14 DIAGNOSIS — M810 Age-related osteoporosis without current pathological fracture: Secondary | ICD-10-CM | POA: Diagnosis present

## 2020-01-14 DIAGNOSIS — E860 Dehydration: Secondary | ICD-10-CM | POA: Diagnosis present

## 2020-01-14 DIAGNOSIS — K589 Irritable bowel syndrome without diarrhea: Secondary | ICD-10-CM | POA: Diagnosis present

## 2020-01-14 DIAGNOSIS — R6510 Systemic inflammatory response syndrome (SIRS) of non-infectious origin without acute organ dysfunction: Secondary | ICD-10-CM | POA: Diagnosis present

## 2020-01-14 DIAGNOSIS — R627 Adult failure to thrive: Secondary | ICD-10-CM | POA: Diagnosis present

## 2020-01-14 DIAGNOSIS — R4182 Altered mental status, unspecified: Secondary | ICD-10-CM | POA: Diagnosis not present

## 2020-01-14 DIAGNOSIS — Z515 Encounter for palliative care: Secondary | ICD-10-CM

## 2020-01-14 DIAGNOSIS — B9620 Unspecified Escherichia coli [E. coli] as the cause of diseases classified elsewhere: Secondary | ICD-10-CM | POA: Diagnosis present

## 2020-01-14 DIAGNOSIS — I5032 Chronic diastolic (congestive) heart failure: Secondary | ICD-10-CM | POA: Diagnosis present

## 2020-01-14 DIAGNOSIS — G40909 Epilepsy, unspecified, not intractable, without status epilepticus: Secondary | ICD-10-CM

## 2020-01-14 DIAGNOSIS — Z8249 Family history of ischemic heart disease and other diseases of the circulatory system: Secondary | ICD-10-CM

## 2020-01-14 DIAGNOSIS — Z66 Do not resuscitate: Secondary | ICD-10-CM | POA: Diagnosis not present

## 2020-01-14 DIAGNOSIS — G9340 Encephalopathy, unspecified: Secondary | ICD-10-CM | POA: Diagnosis present

## 2020-01-14 DIAGNOSIS — N390 Urinary tract infection, site not specified: Secondary | ICD-10-CM | POA: Diagnosis present

## 2020-01-14 DIAGNOSIS — I110 Hypertensive heart disease with heart failure: Secondary | ICD-10-CM | POA: Diagnosis present

## 2020-01-14 DIAGNOSIS — Z20822 Contact with and (suspected) exposure to covid-19: Secondary | ICD-10-CM | POA: Diagnosis present

## 2020-01-14 DIAGNOSIS — K219 Gastro-esophageal reflux disease without esophagitis: Secondary | ICD-10-CM | POA: Diagnosis present

## 2020-01-14 DIAGNOSIS — Z79899 Other long term (current) drug therapy: Secondary | ICD-10-CM

## 2020-01-14 DIAGNOSIS — G8191 Hemiplegia, unspecified affecting right dominant side: Secondary | ICD-10-CM | POA: Diagnosis present

## 2020-01-14 DIAGNOSIS — Z85828 Personal history of other malignant neoplasm of skin: Secondary | ICD-10-CM

## 2020-01-14 DIAGNOSIS — G936 Cerebral edema: Secondary | ICD-10-CM | POA: Diagnosis present

## 2020-01-14 DIAGNOSIS — Z7901 Long term (current) use of anticoagulants: Secondary | ICD-10-CM | POA: Diagnosis not present

## 2020-01-14 DIAGNOSIS — G9341 Metabolic encephalopathy: Secondary | ICD-10-CM | POA: Diagnosis present

## 2020-01-14 DIAGNOSIS — E785 Hyperlipidemia, unspecified: Secondary | ICD-10-CM | POA: Diagnosis present

## 2020-01-14 DIAGNOSIS — Z923 Personal history of irradiation: Secondary | ICD-10-CM

## 2020-01-14 DIAGNOSIS — Z91030 Bee allergy status: Secondary | ICD-10-CM

## 2020-01-14 DIAGNOSIS — R4702 Dysphasia: Secondary | ICD-10-CM | POA: Diagnosis present

## 2020-01-14 DIAGNOSIS — D320 Benign neoplasm of cerebral meninges: Secondary | ICD-10-CM | POA: Diagnosis present

## 2020-01-14 DIAGNOSIS — Z86711 Personal history of pulmonary embolism: Secondary | ICD-10-CM | POA: Diagnosis present

## 2020-01-14 DIAGNOSIS — D329 Benign neoplasm of meninges, unspecified: Secondary | ICD-10-CM | POA: Diagnosis present

## 2020-01-14 DIAGNOSIS — Z888 Allergy status to other drugs, medicaments and biological substances status: Secondary | ICD-10-CM

## 2020-01-14 DIAGNOSIS — R29810 Facial weakness: Secondary | ICD-10-CM | POA: Diagnosis not present

## 2020-01-14 DIAGNOSIS — G4089 Other seizures: Secondary | ICD-10-CM | POA: Diagnosis not present

## 2020-01-14 DIAGNOSIS — C700 Malignant neoplasm of cerebral meninges: Secondary | ICD-10-CM | POA: Diagnosis not present

## 2020-01-14 DIAGNOSIS — Z7982 Long term (current) use of aspirin: Secondary | ICD-10-CM

## 2020-01-14 DIAGNOSIS — I2699 Other pulmonary embolism without acute cor pulmonale: Secondary | ICD-10-CM | POA: Diagnosis not present

## 2020-01-14 DIAGNOSIS — R471 Dysarthria and anarthria: Secondary | ICD-10-CM | POA: Diagnosis present

## 2020-01-14 DIAGNOSIS — Z7189 Other specified counseling: Secondary | ICD-10-CM | POA: Diagnosis not present

## 2020-01-14 LAB — I-STAT VENOUS BLOOD GAS, ED
Acid-Base Excess: 1 mmol/L (ref 0.0–2.0)
Bicarbonate: 24.4 mmol/L (ref 20.0–28.0)
Calcium, Ion: 0.97 mmol/L — ABNORMAL LOW (ref 1.15–1.40)
HCT: 31 % — ABNORMAL LOW (ref 36.0–46.0)
Hemoglobin: 10.5 g/dL — ABNORMAL LOW (ref 12.0–15.0)
O2 Saturation: 99 %
Potassium: 3.1 mmol/L — ABNORMAL LOW (ref 3.5–5.1)
Sodium: 147 mmol/L — ABNORMAL HIGH (ref 135–145)
TCO2: 25 mmol/L (ref 22–32)
pCO2, Ven: 33.3 mmHg — ABNORMAL LOW (ref 44.0–60.0)
pH, Ven: 7.474 — ABNORMAL HIGH (ref 7.250–7.430)
pO2, Ven: 144 mmHg — ABNORMAL HIGH (ref 32.0–45.0)

## 2020-01-14 LAB — CBC WITH DIFFERENTIAL/PLATELET
Abs Immature Granulocytes: 0.07 10*3/uL (ref 0.00–0.07)
Basophils Absolute: 0.1 10*3/uL (ref 0.0–0.1)
Basophils Relative: 1 %
Eosinophils Absolute: 0.3 10*3/uL (ref 0.0–0.5)
Eosinophils Relative: 2 %
HCT: 43.9 % (ref 36.0–46.0)
Hemoglobin: 13.6 g/dL (ref 12.0–15.0)
Immature Granulocytes: 1 %
Lymphocytes Relative: 14 %
Lymphs Abs: 1.7 10*3/uL (ref 0.7–4.0)
MCH: 31 pg (ref 26.0–34.0)
MCHC: 31 g/dL (ref 30.0–36.0)
MCV: 100 fL (ref 80.0–100.0)
Monocytes Absolute: 0.9 10*3/uL (ref 0.1–1.0)
Monocytes Relative: 7 %
Neutro Abs: 8.9 10*3/uL — ABNORMAL HIGH (ref 1.7–7.7)
Neutrophils Relative %: 75 %
Platelets: 403 10*3/uL — ABNORMAL HIGH (ref 150–400)
RBC: 4.39 MIL/uL (ref 3.87–5.11)
RDW: 12.6 % (ref 11.5–15.5)
WBC: 11.9 10*3/uL — ABNORMAL HIGH (ref 4.0–10.5)
nRBC: 0 % (ref 0.0–0.2)

## 2020-01-14 LAB — URINALYSIS, COMPLETE (UACMP) WITH MICROSCOPIC
Bilirubin Urine: NEGATIVE
Glucose, UA: NEGATIVE mg/dL
Hgb urine dipstick: NEGATIVE
Ketones, ur: 20 mg/dL — AB
Leukocytes,Ua: NEGATIVE
Nitrite: NEGATIVE
Protein, ur: NEGATIVE mg/dL
Specific Gravity, Urine: 1.024 (ref 1.005–1.030)
pH: 5 (ref 5.0–8.0)

## 2020-01-14 LAB — LACTIC ACID, PLASMA: Lactic Acid, Venous: 1.1 mmol/L (ref 0.5–1.9)

## 2020-01-14 LAB — COMPREHENSIVE METABOLIC PANEL
ALT: 18 U/L (ref 0–44)
AST: 30 U/L (ref 15–41)
Albumin: 3.1 g/dL — ABNORMAL LOW (ref 3.5–5.0)
Alkaline Phosphatase: 66 U/L (ref 38–126)
Anion gap: 15 (ref 5–15)
BUN: 15 mg/dL (ref 8–23)
CO2: 26 mmol/L (ref 22–32)
Calcium: 9.4 mg/dL (ref 8.9–10.3)
Chloride: 103 mmol/L (ref 98–111)
Creatinine, Ser: 0.85 mg/dL (ref 0.44–1.00)
GFR, Estimated: >60 mL/min (ref >60)
Glucose, Bld: 89 mg/dL (ref 70–99)
Potassium: 4.1 mmol/L (ref 3.5–5.1)
Sodium: 144 mmol/L (ref 135–145)
Total Bilirubin: 1 mg/dL (ref 0.3–1.2)
Total Protein: 6.9 g/dL (ref 6.5–8.1)

## 2020-01-14 LAB — PROTIME-INR
INR: 1.5 — ABNORMAL HIGH (ref 0.8–1.2)
Prothrombin Time: 17.9 seconds — ABNORMAL HIGH (ref 11.4–15.2)

## 2020-01-14 LAB — RESP PANEL BY RT-PCR (FLU A&B, COVID) ARPGX2
Influenza A by PCR: NEGATIVE
Influenza B by PCR: NEGATIVE
SARS Coronavirus 2 by RT PCR: NEGATIVE

## 2020-01-14 LAB — AMMONIA: Ammonia: 51 umol/L — ABNORMAL HIGH (ref 9–35)

## 2020-01-14 LAB — PROCALCITONIN: Procalcitonin: <0.1 ng/mL

## 2020-01-14 MED ORDER — LEVETIRACETAM 750 MG PO TABS
750.0000 mg | ORAL_TABLET | Freq: Two times a day (BID) | ORAL | Status: DC
  Administered 2020-01-15 – 2020-01-16 (×3): 750 mg via ORAL
  Filled 2020-01-14 (×4): qty 1

## 2020-01-14 MED ORDER — DEXAMETHASONE SODIUM PHOSPHATE 10 MG/ML IJ SOLN
10.0000 mg | Freq: Once | INTRAMUSCULAR | Status: AC
  Administered 2020-01-14: 10 mg via INTRAVENOUS
  Filled 2020-01-14: qty 1

## 2020-01-14 MED ORDER — ACETAMINOPHEN 325 MG PO TABS
650.0000 mg | ORAL_TABLET | Freq: Four times a day (QID) | ORAL | Status: DC | PRN
  Administered 2020-01-28: 650 mg via ORAL
  Filled 2020-01-14 (×2): qty 2

## 2020-01-14 MED ORDER — ONDANSETRON HCL 4 MG/2ML IJ SOLN: 4.0000 mg | Freq: Four times a day (QID) | INTRAMUSCULAR | Status: DC | PRN

## 2020-01-14 MED ORDER — ONDANSETRON HCL 4 MG PO TABS: 4.0000 mg | ORAL_TABLET | Freq: Four times a day (QID) | ORAL | Status: DC | PRN

## 2020-01-14 MED ORDER — HEPARIN (PORCINE) 25000 UT/250ML-% IV SOLN
1100.0000 [IU]/h | INTRAVENOUS | Status: DC
  Administered 2020-01-14: 1150 [IU]/h via INTRAVENOUS
  Administered 2020-01-15: 1000 [IU]/h via INTRAVENOUS
  Administered 2020-01-16: 1150 [IU]/h via INTRAVENOUS
  Administered 2020-01-17 – 2020-01-18 (×2): 1100 [IU]/h via INTRAVENOUS
  Filled 2020-01-14 (×6): qty 250

## 2020-01-14 MED ORDER — EZETIMIBE 10 MG PO TABS
10.0000 mg | ORAL_TABLET | Freq: Every day | ORAL | Status: DC
Start: 2020-01-15 — End: 2020-01-18
  Administered 2020-01-15 – 2020-01-18 (×4): 10 mg via ORAL
  Filled 2020-01-14 (×4): qty 1

## 2020-01-14 MED ORDER — SODIUM CHLORIDE 0.9 % IV SOLN
2000.0000 mg | Freq: Once | INTRAVENOUS | Status: AC
  Administered 2020-01-14: 2000 mg via INTRAVENOUS
  Filled 2020-01-14: qty 20

## 2020-01-14 MED ORDER — ACETAMINOPHEN 650 MG RE SUPP: 650.0000 mg | Freq: Four times a day (QID) | RECTAL | Status: DC | PRN

## 2020-01-14 MED ORDER — DEXAMETHASONE SODIUM PHOSPHATE 4 MG/ML IJ SOLN
4.0000 mg | Freq: Two times a day (BID) | INTRAMUSCULAR | Status: DC
  Administered 2020-01-15 – 2020-01-20 (×12): 4 mg via INTRAVENOUS
  Filled 2020-01-14 (×13): qty 1

## 2020-01-14 MED ORDER — SODIUM CHLORIDE 0.9 % IV SOLN
75.0000 mL/h | INTRAVENOUS | Status: AC
  Administered 2020-01-14: 75 mL/h via INTRAVENOUS

## 2020-01-14 NOTE — Telephone Encounter (Signed)
Patient's significant other , [name], called to report pt's decline over the weekend. He stated that he may be unable to get her in for her appointment on 01/15/20. He reported pt only able to stand for a brief periods, confusion, minimal appetite, is still able to swallow pills and liquids, incontinent and is currently in an upstairs bedroom. He asked about SNF care/admit.  Information given to Dr. Mickeal Skinner and plan is for Dr. Mickeal Skinner to call Mr. Jerolyn Center.

## 2020-01-14 NOTE — Consult Note (Addendum)
Neurology Consultation  Reason for Consult: Altered mental status, concern for seizures Referring Physician: Dr Alvino Chapel   CC: Altered mental status, concern for seizures  History is obtained from: Chart, patient's significant other  HPI: [name] [name] is a 75 y.o. 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 She was in her usual state of health till about 10 days ago and her significant other noticed that she has been requiring more help with ambulation.  She also started appearing more sleepy and somnolent as well as had difficulty performing her ADLs. She appeared confused to the family-no frank aphasia but difficulty with sentences and following commands definitely was noted.  She had craniotomy for a left frontal resection for the WHO grade 1 meningioma in September 2019, stereotactic radiation with Dr. Isidore Moos in January 2021, progression of the left frontal meningioma requiring repeat craniotomy 11/09/2019 and repeat MRI brain done 3 days ago shows likely recurrence in the left frontal meningioma resection site.  Review of systems unable to ascertain due to patient's mentation with the significant other reports no fevers chills.  No nausea vomiting.  Reports a strong urine smell and increasing frequency of urination.  ROS:  Unable to obtain due to altered mental status.   Past Medical History:  Diagnosis Date  . Cancer ([facility])    skin cancer   . History of radiation therapy 01/09/19- 01/19/19   SRS brain radiation. 5 fractions to total 25 Gy  . Hyperlipidemia   . Hypertension   . IBS (irritable bowel syndrome)   . Osteoporosis   . PONV (postoperative nausea and vomiting)    with breast surgery  . SOBOE (shortness of breath on  exertion)     Family History  Problem Relation Age of Onset  . Breast cancer Mother   . Heart disease Father   . Colon cancer Maternal Aunt      Social History:   reports that she has never smoked. She has never used smokeless tobacco. She reports that she does not drink alcohol and does not use drugs.  Medications No current facility-administered medications for this encounter.  Current Outpatient Medications:  .  acetaminophen (TYLENOL) 325 MG tablet, Take 325-650 mg by mouth every 6 (six) hours as needed for mild pain or headache., Disp: , Rfl:  .  apixaban (ELIQUIS) 5 MG TABS tablet, Take 1 tablet (5 mg total) by mouth 2 (two) times daily., Disp: 60 tablet, Rfl: 9 .  aspirin EC 81 MG tablet, Take 81 mg by mouth daily. Swallow whole., Disp: , Rfl:  .  ezetimibe (ZETIA) 10 MG tablet, Take 1 tablet (10 mg total) by mouth daily., Disp: 90 tablet, Rfl: 1 .  furosemide (LASIX) 20 MG tablet, Take 1 tablet (20 mg total) by mouth daily. Take one in the am. May take a 2nd dose around noon if desired, Disp: 60 tablet, Rfl: 3 .  levETIRAcetam (KEPPRA) 500 MG tablet, Take 1 tablet (500 mg total) by mouth 2 (two) times daily., Disp: 60 tablet, Rfl: 0 .  potassium chloride (KLOR-CON) 10 MEQ tablet, TAKE 1 TABLET BY MOUTH DAILY, ON DAYS THAT YOU USE THE LASIX, Disp: 90 tablet, Rfl: 1 .  Prenatal MV & Min w/FA-DHA (PRENATAL ADULT GUMMY/DHA/FA PO), Take 1 tablet by mouth daily. , Disp: ,  Rfl:    Exam: Current vital signs: BP (!) 152/92   Pulse 96   Temp 98.5 F (36.9 C) (Oral)   Resp 20   SpO2 96%  Vital signs in last 24 hours: Temp:  [98.5 F (36.9 C)] 98.5 F (36.9 C) (01/10 1720) Pulse Rate:  [96-105] 96 (01/10 1830) Resp:  [18-20] 20 (01/10 1830) BP: (152-155)/(89-92) 152/92 (01/10 1830) SpO2:  [96 %-98 %] 96 % (01/10 1830)  General: Awake alert in no distress HEENT: Normocephalic and atraumatic with dry oral mucous membranes Lungs: Clear to auscultation Cardiovascular: Regular  rhythm Extremities warm well perfused with trace edema Neurological exam Awake alert oriented to self.  Could not tell me the date.  Could not tell me where she is. Has difficulty repeating. She is able to name simple objects inconsistently Speech is mildly dysarthric. Poor attention concentration Cranial nerves: Pupils equal round reactive to light, extraocular movements appear unrestricted, visual fields appear full to threat, mild right lower facial weakness noted. Motor examination: Bilateral upper extremities antigravity with mild right upper extremity weakness 4+/5.  Right lower extremity is 3/5 and with a lot of coaching may be 4 -/5 at times.  Left lower extremity is 4+/5. Sensory exam: Intact to touch Coordination difficult to assess   Labs I have reviewed labs in epic and the results pertinent to this consultation are:   CBC    Component Value Date/Time   WBC 11.0 (H) 12/04/2019 0930   RBC 3.60 (L) 12/04/2019 0930   HGB 11.9 (L) 12/04/2019 0930   HCT 36.3 12/04/2019 0930   PLT 194 12/04/2019 0930   MCV 100.8 (H) 12/04/2019 0930   MCH 33.1 12/04/2019 0930   MCHC 32.8 12/04/2019 0930   RDW 14.8 12/04/2019 0930   LYMPHSABS 1.1 12/04/2019 0930   MONOABS 0.6 12/04/2019 0930   EOSABS 0.1 12/04/2019 0930   BASOSABS 0.1 12/04/2019 0930    CMP     Component Value Date/Time   NA 144 12/24/2019 1523   NA 141 07/15/2015 0000   NA 141 07/15/2015 0000   K 4.2 12/24/2019 1523   CL 103 12/24/2019 1523   CO2 32 12/24/2019 1523   GLUCOSE 86 12/24/2019 1523   BUN 14 12/24/2019 1523   BUN 22 (A) 07/15/2015 0000   BUN 22 (A) 07/15/2015 0000   CREATININE 0.98 12/24/2019 1523   CREATININE 0.84 12/26/2018 1217   CALCIUM 9.7 12/24/2019 1523   PROT 6.7 10/22/2019 1151   ALBUMIN 3.4 (L) 10/22/2019 1151   AST 30 10/22/2019 1151   ALT 31 10/22/2019 1151   ALKPHOS 51 10/22/2019 1151   BILITOT 0.9 10/22/2019 1151   GFRNONAA >60 12/04/2019 0930   GFRNONAA >60 12/26/2018 1217    GFRAA >60 12/26/2018 1217    Lipid Panel     Component Value Date/Time   CHOL 261 (H) 10/23/2019 0318   TRIG 115 10/23/2019 0318   HDL 56 10/23/2019 0318   CHOLHDL 4.7 10/23/2019 0318   VLDL 23 10/23/2019 0318   LDLCALC 182 (H) 10/23/2019 0318   LDLDIRECT 157.0 03/15/2019 1338     Imaging I have reviewed the images obtained: CT-scan of the brain obtained today shows postsurgical changes from bilateral frontal craniotomies for extra-axial tumor resection with a persistent large surgical cavity with thick nodular margins with the fluid portion measuring up to 5 cm similar to prior MRI from January 11, 2020.  Stable appearance of prominent edema within the white matter of the bilateral frontal lobes including  corpus callosum.  Unchanged appearance of extra-axial lesion centered in the left sphenoid wing/cavernous sinus.   MRI examination of the brain 01/11/2020-postop resection of bifrontal meningioma with a large surgical cavity in the left frontal lobe.  There is extensive nodular enhancement around the wall of the cavity that is compatible with recurrent meningioma.  This has grown rapidly since the prior MRI of 11/10/2019.  Mild progression of the bifrontal white matter edema.  Bulky left cavernous carotid meningioma that is stable.  Assessment: 75 year old woman with bilateral frontal meningiomas worse on the left status post multiple craniotomies for recurrence, presenting for 7 to 10 days worth of altered mental status, difficulty with ambulation and confusion along with word finding difficulty. No noted or witnessed seizure activity. MRI done 3 days ago with recurrence of the left frontal meningioma and CT scan done today with no bleed and stable findings from 3 days ago. Her symptoms are likely related to the worsening recurrent left frontal meningioma. It is prudent to rule out seizures. Currently does not appear to be in status epilepticus  Impression: Known meningiomas Likely  recurrence of her left frontal meningioma Underlying seizure disorder-evaluate for recurrent seizures Evaluate for underlying infection such as UTI or pneumonia  Recommendations: Additional Keppra load-2 g IV x1 followed by Keppra 750 twice daily. Use Ativan for any seizure lasting more than 5 minutes Routine EEG in the morning Seizure precautions Telemetry Will touch base with the neuro oncologist in the morning for further recommendations In the interim, would definitely also recommend steroids- start Decadron at least 4 mg twice daily after a 10 mg dose now.  Also check urinalysis, chest x-ray  Discussed my plan with Dr. Alvino Chapel in the ER  -- Amie Portland, MD Neurologist [facility] Pager: [phone]

## 2020-01-14 NOTE — ED Provider Notes (Signed)
[facility]ENC[facility] Provider Note   CSN: [id number] Arrival date & time: 01/14/20  1658     History Chief Complaint  Patient presents with  . Altered Mental Status    [name] AngerBetty [name] is a 75 y.o. female with pertinent past medical history of hypertension, left frontal meningioma and cavernous sinus meningioma followed by Dr. Barbaraann CaoVaslow neuro-oncology, hypertension, hyperlipidemia, focal seizures on Keppra, recent pulmonary embolism on Eliquis that presents emergency department today for altered mental status via EMS.  Patient's fianc is primary historian, patient is currently nonverbal.  Patient is level 5 caveat.  Patient fianc states that he does not live with her, however he has noticed for the past week she has been declining.  He states that she has been feeling weak and has stopped speaking. They did call Dr. Liana GeroldVaslow's office who instructed her to come to the emergency department today for questionable seizure-like activity and wanting a head CT.  Neysa BonitoFianc states that she has started to become nonverbal over the past week.  States that he has been giving her medication, has been taking her Eliquis and [place]ra.  Denies any fevers, chills, vomiting, diarrhea.  States that there has been a strong smell of urine in the house.  He denies any seizure like activity, has been compliant with seizure meds. He states that he is unable to provide any more history at this time.  Patient is following commands, however does not respond to questions.  HPI     Past Medical History:  Diagnosis Date  . Cancer (HCC)    skin cancer   . History of radiation therapy 01/09/19- 01/19/19   SRS brain radiation. 5 fractions to total 25 Gy  . Hyperlipidemia   . Hypertension   . IBS (irritable bowel syndrome)   . Osteoporosis   . PONV (postoperative nausea and vomiting)    with breast surgery  . SOBOE (shortness of breath on exertion)     Patient Active Problem List   Diagnosis Date  Noted  . Focal seizure (HCC) 12/03/2019  . Brain tumor (benign) (HCC) 11/09/2019  . Acute metabolic encephalopathy 10/23/2019  . AMS (altered mental status) 10/22/2019  . Meningioma, cerebral (HCC) 12/22/2018  . Acute pulmonary embolism (HCC) 09/28/2017  . Dyspnea on exertion 09/28/2017  . Hypoxia 09/28/2017  . Meningioma (HCC) 09/07/2017  . FATIGUE 09/12/2009  . MYALGIA 10/31/2008  . Exertional dyspnea 10/31/2008  . Hyperlipidemia 11/11/2006  . OTHER SPECIFIED ANEMIAS 11/11/2006  . GERD 11/11/2006  . Osteoporosis 11/11/2006  . IRRITABLE BOWEL SYNDROME, HX OF 10/20/2006    Past Surgical History:  Procedure Laterality Date  . APPLICATION OF CRANIAL NAVIGATION N/A 09/07/2017   Procedure: APPLICATION OF CRANIAL NAVIGATION;  Surgeon: Donalee Citrinram, Gary, MD;  [facility];  Service: Neurosurgery;  Laterality: N/A;  . APPLICATION OF CRANIAL NAVIGATION Bilateral 11/09/2019   Procedure: APPLICATION OF CRANIAL NAVIGATION;  Surgeon: Donalee Citrinram, Gary, MD;  [facility];  Service: Neurosurgery;  Laterality: Bilateral;  . BRAIN SURGERY  09/2017  . BUNIONECTOMY    . COLONOSCOPY     [facility] Dr Madilyn FiremanHayes probably about 10 years ago  . CRANIOTOMY N/A 09/07/2017   Procedure: CRAINIOTOMY BI CORONAL w/BRAINLAB;  Surgeon: Donalee Citrinram, Gary, MD;  [facility];  Service: Neurosurgery;  Laterality: N/A;  . CRANIOTOMY Bilateral 11/09/2019   Procedure: Craniotomy - bilateral - Frontal stereotactic;  Surgeon: Donalee Citrinram, Gary, MD;  [facility];  Service: Neurosurgery;  Laterality: Bilateral;  . EYE SURGERY Bilateral  cataract surgery  . SKIN CANCER EXCISION    . WISDOM TOOTH EXTRACTION       OB History   No obstetric history on file.     Family History  Problem Relation Age of Onset  . Breast cancer Mother   . Heart disease Father   . Colon cancer Maternal Aunt     Social History   Tobacco Use  . Smoking status: Never Smoker  . Smokeless tobacco: Never Used  Vaping Use  . Vaping Use: Never used  Substance  Use Topics  . Alcohol use: No  . Drug use: No    Home Medications Prior to Admission medications   Medication Sig Start Date End Date Taking? Authorizing Provider  acetaminophen (TYLENOL) 325 MG tablet Take 325-650 mg by mouth every 6 (six) hours as needed for mild pain or headache.    [provider]  apixaban (ELIQUIS) 5 MG TABS tablet Take 1 tablet (5 mg total) by mouth 2 (two) times daily. 12/24/19   Copland, Gay Filler, MD  aspirin EC 81 MG tablet Take 81 mg by mouth daily. Swallow whole.    [provider]  ezetimibe (ZETIA) 10 MG tablet Take 1 tablet (10 mg total) by mouth daily. 08/01/19   Copland, Gay Filler, MD  furosemide (LASIX) 20 MG tablet Take 1 tablet (20 mg total) by mouth daily. Take one in the am. May take a 2nd dose around noon if desired 12/25/19   Copland, Gay Filler, MD  levETIRAcetam (KEPPRA) 500 MG tablet Take 1 tablet (500 mg total) by mouth 2 (two) times daily. 10/25/19   Little Ishikawa, MD  potassium chloride (KLOR-CON) 10 MEQ tablet TAKE 1 TABLET BY MOUTH DAILY, ON DAYS THAT YOU USE THE LASIX 12/25/19   Copland, Gay Filler, MD  Prenatal MV & Min w/FA-DHA (PRENATAL ADULT GUMMY/DHA/FA PO) Take 1 tablet by mouth daily.     [provider]    Allergies    Bee venom and Losartan  Review of Systems   Review of Systems  Unable to perform ROS: Mental status change    Physical Exam Updated Vital Signs BP (!) 167/92   Pulse 96   Temp 98.5 F (36.9 C) (Oral)   Resp 19   SpO2 97%   Physical Exam Constitutional:      General: She is not in acute distress.    Appearance: Normal appearance. She is not ill-appearing, toxic-appearing or diaphoretic.     Comments: No acute distress.  Nonverbal.  HENT:     Head: Normocephalic and atraumatic.     Mouth/Throat:     Mouth: Mucous membranes are moist.     Pharynx: Oropharynx is clear.  Eyes:     General: No scleral icterus.    Extraocular Movements: Extraocular movements intact.      Pupils: Pupils are equal, round, and reactive to light.  Cardiovascular:     Rate and Rhythm: Regular rhythm. Tachycardia present.     Pulses: Normal pulses.     Heart sounds: Normal heart sounds.  Pulmonary:     Effort: Pulmonary effort is normal. No respiratory distress.     Breath sounds: Normal breath sounds. No stridor. No wheezing, rhonchi or rales.  Chest:     Chest wall: No tenderness.  Abdominal:     General: Abdomen is flat. There is no distension.     Palpations: Abdomen is soft.     Tenderness: There is no abdominal tenderness. There is no guarding  or rebound.  Musculoskeletal:        General: No swelling or tenderness. Normal range of motion.     Cervical back: Normal range of motion and neck supple. No rigidity.     Right lower leg: No edema.     Left lower leg: No edema.  Skin:    General: Skin is warm and dry.     Capillary Refill: Capillary refill takes less than 2 seconds.     Coloration: Skin is not pale.  Neurological:     General: No focal deficit present.     Mental Status: She is alert and oriented to person, place, and time.     Motor: Weakness present.     Comments: Patient is nonverbal.  Will follow some commands.  Right side is significantly more weak than left side, questionable facial droop on right side.  No clonus.  Patient will not lift up her right arm, when I lifted up it will immediately come back down versus left arm will stay up. 2/5 strength of right lower extremity.  Psychiatric:        Mood and Affect: Mood normal.        Behavior: Behavior normal.     ED Results / Procedures / Treatments   Labs (all labs ordered are listed, but only abnormal results are displayed) Labs Reviewed  COMPREHENSIVE METABOLIC PANEL - Abnormal; Notable for the following components:      Result Value   Albumin 3.1 (*)    All other components within normal limits  CBC WITH DIFFERENTIAL/PLATELET - Abnormal; Notable for the following components:   WBC 11.9 (*)     Platelets 403 (*)    Neutro Abs 8.9 (*)    All other components within normal limits  URINALYSIS, COMPLETE (UACMP) WITH MICROSCOPIC - Abnormal; Notable for the following components:   APPearance CLOUDY (*)    Ketones, ur 20 (*)    Bacteria, UA RARE (*)    All other components within normal limits  AMMONIA - Abnormal; Notable for the following components:   Ammonia 51 (*)    All other components within normal limits  CULTURE, BLOOD (ROUTINE X 2)  CULTURE, BLOOD (ROUTINE X 2)  RESP PANEL BY RT-PCR (FLU A&B, COVID) ARPGX2  LACTIC ACID, PLASMA  LACTIC ACID, PLASMA  CBG MONITORING, ED    EKG EKG Interpretation  Date/Time:  Monday January 14 2020 18:23:34 EST Ventricular Rate:  99 PR Interval:    QRS Duration: 81 QT Interval:  334 QTC Calculation: 429 R Axis:   43 Text Interpretation: Sinus rhythm Consider left ventricular hypertrophy Anterior Q waves, possibly due to LVH Confirmed by Davonna Belling [phone]) on 01/14/2020 6:25:29 PM   Radiology CT HEAD WO CONTRAST  Result Date: 01/14/2020 CLINICAL DATA:  Delirium. EXAM: CT HEAD WITHOUT CONTRAST TECHNIQUE: Contiguous axial images were obtained from the base of the skull through the vertex without intravenous contrast. COMPARISON:  MRI of the brain January 11, 2020. FINDINGS: Brain: Postsurgical changes from bifrontal craniotomy for left frontal extra-axial tumor resection. Persistent large surgical cavity with thick nodular margins with the fluid portion measuring approximately 5.0 x 4.6 x 2.7 cm, similar to prior MRI. Mass effect with mild rightward deviation of the anterior aspect of the cerebral falx (5 mm). Stable appearance of prominent edema within the white matter of the bilateral frontal lobes, including corpus callosum. Unchanged appearance of extra-axial lesion centered in the left sphenoid wing/cavernous sinus. No hydrocephalus or intracranial hemorrhage. Posterior fossa  structures appear preserved. Vascular: No hyperdense  vessel sign. Skull: Postsurgical changes from bilateral frontal craniotomy. Sinuses/Orbits: Mucosal thickening of the left sphenoid sinus. The orbits are maintained. IMPRESSION: 1. Postsurgical changes from bilateral frontal craniotomy for extra-axial tumor resection. Persistent large surgical cavity with thick nodular margins with the fluid portion measuring up to 5.0 cm, similar to prior MRI. Stable appearance of prominent edema within the white matter of the bilateral frontal lobes, including corpus callosum. 2. Unchanged appearance of extra-axial lesion centered in the left sphenoid wing/cavernous sinus. Electronically Signed   By: Pedro Earls M.D.   On: 01/14/2020 19:01    Procedures Procedures (including critical care time)  Medications Ordered in ED Medications  levETIRAcetam (KEPPRA) tablet 750 mg (has no administration in time range)  dexamethasone (DECADRON) injection 4 mg (has no administration in time range)  dexamethasone (DECADRON) injection 10 mg (10 mg Intravenous Given 01/14/20 2005)  levETIRAcetam (KEPPRA) 2,000 mg in sodium chloride 0.9 % 250 mL IVPB (0 mg Intravenous Stopped 01/14/20 2029)    ED Course  I have reviewed the triage vital signs and the nursing notes.  Pertinent labs & imaging results that were available during my care of the patient were reviewed by me and considered in my medical decision making (see chart for details).    MDM Rules/Calculators/A&P                         Jaylon Grode is a 75 y.o. female with pertinent past medical history of hypertension, left frontal meningioma and cavernous sinus meningioma followed by Dr. Mickeal Skinner neuro-oncology, hypertension, hyperlipidemia, focal seizures, recent pulmonary embolism on Eliquis that presents emergency department today for altered mental status via EMS.  Patient nonverbal on exam, patient with significant right-sided weakness compared to left side.  Patient does have history of hemiparesis  on right side, Dr. Renda Rolls note states that she does have 4 out of 5 strength on right side, however I think that her strength on her right side is 3 out of 5, could be worse or baseline.  Unsure when last known normal was, fianc states that its been a week.  Dr. Malen Gauze made aware, he said to call back once imaging and labs result.  Dr. Alvino Chapel also evaluated patient, do not think that we need to call code stroke at this time.  Work-up today for altered mental status initiated.  Temperature of 100.4 with EMS, retaken here temperature 98.5 without any antipyretics on board.  Patient is slightly tachycardic will obtain blood cultures and lactic acid at this time as well.  CT head below  IMPRESSION:  1. Postsurgical changes from bilateral frontal craniotomy for  extra-axial tumor resection. Persistent large surgical cavity with  thick nodular margins with the fluid portion measuring up to 5.0 cm,  similar to prior MRI. Stable appearance of prominent edema within  the white matter of the bilateral frontal lobes, including corpus  callosum.  2. Unchanged appearance of extra-axial lesion centered in the left  sphenoid wing/cavernous sinus.    Work-up today with CMP unremarkable, CBC with small leukocytosis of 11.9.  Lactic acid 1.1.  Ammonia slightly elevated at 51, urinalysis does not show signs of infection.  Dr. Malen Gauze came to see patient, probable seizure they will consult.  EEG in the morning.  Patient to be admitted at the hospitalist.  Upon reevaluation, mental status remains the same.  953 spoke to Dr. Roel Cluck, hospitalist will accept the patient.  The patient appears reasonably stabilized for admission considering the current resources, flow, and capabilities available in the ED at this time, and I doubt any other [facility] requiring further screening and/or treatment in the ED prior to admission.  Doubt need for further emergent work up at this time. I explained the diagnosis and have  given explicit precautions to return to the ER including for any other new or worsening symptoms. The patient understands and accepts the medical plan as it's been dictated and I have answered their questions. Discharge instructions concerning home care and prescriptions have been given. The patient is STABLE and is discharged to home in good condition.     Final Clinical Impression(s) / ED Diagnoses Final diagnoses:  Altered mental status, unspecified altered mental status type    Rx / DC Orders ED Discharge Orders    None       Alfredia Client, PA-C 01/14/20 2153    Davonna Belling, MD 01/14/20 2326

## 2020-01-14 NOTE — Progress Notes (Signed)
[name] [name] for Heparin Indication: Anticiagulation while holding Apixaban.   Allergies  Allergen Reactions  . Bee Venom Swelling and Other (See Comments)    Site of swelling not recalled by family- happened during childhood  . Losartan Itching    Patient Measurements: Height: 5\' 2"  (157.5 cm) Weight: 76 kg (167 lb 8.8 oz) IBW/kg (Calculated) : 50.1 Heparin Dosing Weight: 66.6kg  Vital Signs: Temp: 98.5 F (36.9 C) (01/10 1720) Temp Source: Oral (01/10 1720) BP: 173/95 (01/10 2145) Pulse Rate: 98 (01/10 2145)  Labs: Recent Labs    01/14/20 1735  HGB 13.6  HCT 43.9  PLT 403*  CREATININE 0.85    Estimated Creatinine Clearance: 57.1 mL/min (by C-G formula based on SCr of 0.85 mg/dL).   Medical History: Past Medical History:  Diagnosis Date  . Cancer ([place])    skin cancer   . History of radiation therapy 01/09/19- 01/19/19   SRS brain radiation. 5 fractions to total 25 Gy  . Hyperlipidemia   . Hypertension   . IBS (irritable bowel syndrome)   . Osteoporosis   . PONV (postoperative nausea and vomiting)    with breast surgery  . SOBOE (shortness of breath on exertion)     Medications:  Scheduled:  . [START ON 01/15/2020] dexamethasone (DECADRON) injection  4 mg Intravenous Q12H  . [START ON 01/15/2020] levETIRAcetam  750 mg Oral BID    Assessment: Patient is a 70 yof that is being admitted for AMS and concerns for seizures likely 2/2 expanding left frontal meningioma. The patient is on Apixaban PTA for a recent PE in November. Pharmacy has been asked to assist with transition to heparin at this time 2/2 AMS and possible neuro interventions.  Last dose of Apixaban is unknown however likely > 12 hours ago.  Goal of Therapy:  aPTT 66-102 seconds Monitor platelets by anticoagulation protocol: Yes   Plan:  - No Heparin bolus as on Apixaban PTA last dose is unknown at this time likely > 12 hours ago  - Start heparin drip @ 1150  units/hr  - Will monitor heparin using aPTT until aPTT and Heparin levels correlate - aPTT level in ~ 6 hours  - Monitor patient for s/s of bleeding and CBC while on heparin   Duanne Limerick PharmD. BCPS  01/14/2020,10:32 PM  \

## 2020-01-14 NOTE — ED Triage Notes (Signed)
Pt BIB [facility] for AMS, pt has a hx of brain tumor, oncologist suggested she get a CT scan to r/o focal seizures. Pt has a strong urine smell to her. Temporal temp 104.0 with EMS    Temp 104.0 BP

## 2020-01-14 NOTE — H&P (Signed)
[name] [name] JKD:[id number] DOB: 04/03/1945 DOA: 01/14/2020    PCP: Darreld Mclean, MD   Outpatient Specialists:   Dr. Mickeal Skinner meningioma  Patient arrived to ER on 01/14/20 at 1658 Referred by Attending Davonna Belling, MD   Patient coming from: home Lives alone,    but Son visits    Chief Complaint:   Chief Complaint  Patient presents with  . Altered Mental Status    HPI: [name] [name] is a 75 y.o. 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    Prior to this she was performing her ADLs. Family denies slurred speech But she has trouble finding words for sentences She had MRI done 3 days ago showed recurrence of left frontal meningioma at the resection site  Pt unable to provide her own hx  Infectious risk factors:  Reports confusion    Has  been vaccinated against COVID    Initial COVID TEST  NEGATIVE   Lab Results  Component Value Date   [place] 11/06/2019   [place] NEGATIVE 10/25/2019   [place] NEGATIVE 10/22/2019    Regarding pertinent Chronic problems:     Hyperlipidemia -  on statins Zetia Lipid Panel     Component Value Date/Time   CHOL 261 (H) 10/23/2019 0318   TRIG 115 10/23/2019 0318   HDL 56 10/23/2019 0318   CHOLHDL 4.7 10/23/2019 0318   VLDL 23 10/23/2019 0318   LDLCALC 182 (H) 10/23/2019 0318   LDLDIRECT 157.0 03/15/2019 1338   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 seizure precautions Steroids Decadron 10 mg now and 4 mg twice daily  seen in ER  Hospitalist was called for admission for recurrent meningioma with altered mental status  The following Work up has been ordered so far:  Orders Placed This Encounter  Procedures  . Blood culture (routine x 2)  . Resp Panel by RT-PCR (Flu A&B, Covid) Nasopharyngeal Swab  . CT HEAD WO CONTRAST  . DG Chest Port 1 View  . Comprehensive metabolic panel  . CBC WITH DIFFERENTIAL  . Urinalysis, Complete w Microscopic  . Ammonia  . Lactic acid, plasma  . Cardiac monitoring  . Initiate Carrier Fluid Protocol  . In and Out Cath  . Consult to neurology  ALL PATIENTS BEING ADMITTED/HAVING PROCEDURES NEED COVID-19 SCREENING  . Consult to hospitalist  ALL PATIENTS BEING ADMITTED/HAVING PROCEDURES NEED COVID-19 SCREENING  . Consult to hospitalist  ALL PATIENTS BEING ADMITTED/HAVING PROCEDURES NEED COVID-19 SCREENING  . Airborne and Contact precautions  . Pulse oximetry, continuous  . CBG monitoring, ED  . ED EKG  . EKG 12-Lead  . EEG adult  . Insert peripheral IV     Following Medications were ordered in  ER: Medications  levETIRAcetam (KEPPRA) tablet 750 mg (has no administration in time range)  dexamethasone (DECADRON) injection 4 mg (has no administration in time range)  dexamethasone (DECADRON) injection 10 mg (10 mg Intravenous Given 01/14/20 2005)  levETIRAcetam (KEPPRA) 2,000 mg in sodium chloride 0.9 % 250 mL IVPB (0 mg Intravenous Stopped 01/14/20 2029)        Consult Orders  (From admission, onward)         Start     Ordered   01/14/20 2125  Consult to hospitalist  ALL PATIENTS BEING ADMITTED/HAVING PROCEDURES NEED COVID-19 SCREENING A.B  Once       Comments: ALL  PATIENTS BEING ADMITTED/HAVING PROCEDURES NEED COVID-19 SCREENING  Provider:  (Not yet assigned)  Question Answer Comment  Place call to: [facility]   Reason for Consult Admit      01/14/20 2124   01/14/20 2033  Consult to hospitalist  ALL PATIENTS BEING ADMITTED/HAVING PROCEDURES NEED COVID-19 SCREENING  Once       Comments: ALL PATIENTS BEING ADMITTED/HAVING PROCEDURES NEED COVID-19 SCREENING  Provider:  (Not yet assigned)  Question Answer Comment  Place call to: [facility]   Reason for Consult Admit      01/14/20 2032          Significant initial  Findings: Abnormal Labs Reviewed  COMPREHENSIVE METABOLIC PANEL - Abnormal; Notable for the following components:      Result Value   Albumin 3.1 (*)    All other components within normal limits  CBC WITH DIFFERENTIAL/PLATELET - Abnormal; Notable for the following components:   WBC 11.9 (*)    Platelets 403 (*)    Neutro Abs 8.9 (*)    All other components within normal limits  URINALYSIS, COMPLETE (UACMP) WITH MICROSCOPIC - Abnormal; Notable for the following components:   APPearance CLOUDY (*)    Ketones, ur 20 (*)    Bacteria, UA RARE (*)    All other components within normal limits  AMMONIA - Abnormal; Notable for the following components:   Ammonia 51 (*)    All other components within normal limits  PROTIME-INR - Abnormal; Notable for the following components:   Prothrombin Time 17.9 (*)    INR 1.5 (*)    All other components within normal limits  I-STAT VENOUS BLOOD GAS, ED - Abnormal; Notable for the following components:   pH, Ven 7.474 (*)    pCO2, Ven 33.3 (*)    pO2, Ven 144.0 (*)    Sodium 147 (*)    Potassium 3.1 (*)    Calcium, Ion 0.97 (*)    HCT 31.0 (*)    Hemoglobin 10.5 (*)    All other components within normal limits    Otherwise labs showing:    Recent Labs  Lab 01/14/20 1735  NA 144  K 4.1  CO2 26  GLUCOSE 89  BUN 15  CREATININE 0.85  CALCIUM 9.4    Cr   stable,     Lab Results  Component Value Date   CREATININE 0.85 01/14/2020   CREATININE 0.98 12/24/2019   CREATININE 0.66 12/04/2019    Recent Labs  Lab 01/14/20 1735  AST 30  ALT 18  ALKPHOS 66  BILITOT 1.0  PROT 6.9  ALBUMIN 3.1*   Lab Results  Component Value Date   CALCIUM 9.4 01/14/2020     WBC      Component Value Date/Time   WBC 11.9 (H) 01/14/2020 1735   LYMPHSABS 1.7 01/14/2020 1735  MONOABS 0.9 01/14/2020 1735   EOSABS 0.3 01/14/2020 1735   BASOSABS 0.1 01/14/2020 1735   Plt: Lab Results  Component Value Date   PLT 403 (H) 01/14/2020     Lactic Acid, Venous    Component Value Date/Time   LATICACIDVEN 1.1 01/14/2020 1758      Venous  Blood Gas result:  pH  7.474  PCO2 33.3 .  ABG    Component Value Date/Time   PHART 7.464 (H) 11/09/2019 1339   PCO2ART 31.6 (L) 11/09/2019 1339   PO2ART 124 (H) 11/09/2019 1339   HCO3 24.4 01/14/2020 2218   TCO2 25 01/14/2020 2218   ACIDBASEDEF 1.0 11/09/2019 1339   O2SAT 99.0 01/14/2020 2218      HG/HCT   Stable,     Component Value Date/Time   HGB 13.6 01/14/2020 1735   HCT 43.9 01/14/2020 1735   MCV 100.0 01/14/2020 1735    No results for input(s): LIPASE, AMYLASE in the last 168 hours. Recent Labs  Lab 01/14/20 1758  AMMONIA 51*        Cardiac Panel (last 3 results) Recent Labs    01/14/20 1735  CKTOTAL 72       ECG: Ordered Personally reviewed by me showing: HR : 99 Rhythm:  NSR,   no evidence of ischemic changes QTC 429   BNP (last 3 results) Recent Labs    12/04/19 0930  BNP 65.0      DM  labs:  HbA1C: Recent Labs    03/15/19 1338 11/10/19 1530  HGBA1C 5.4 6.1*         UA  no evidence of UTI      Urine analysis:    Component Value Date/Time   COLORURINE YELLOW 01/14/2020 1934   APPEARANCEUR CLOUDY (A) 01/14/2020 1934   LABSPEC 1.024 01/14/2020 1934   PHURINE 5.0 01/14/2020 1934   GLUCOSEU NEGATIVE 01/14/2020 1934   HGBUR NEGATIVE 01/14/2020 1934   BILIRUBINUR  NEGATIVE 01/14/2020 1934   KETONESUR 20 (A) 01/14/2020 1934   PROTEINUR NEGATIVE 01/14/2020 1934   NITRITE NEGATIVE 01/14/2020 1934   LEUKOCYTESUR NEGATIVE 01/14/2020 1934      Ordered  CT HEADPersistent large surgical cavity with thick nodular margins with the fluid portion measuring up to 5.0 cm, similar to prior MRI. Stable appearance of prominent edema within the white matter of the bilateral frontal lobes, including corpus Callosum.   CXR -  NON acute     ED Triage Vitals  Enc Vitals Group     BP 01/14/20 1720 (!) 155/89     Pulse Rate 01/14/20 1720 (!) 105     Resp 01/14/20 1720 18     Temp 01/14/20 1720 98.5 F (36.9 C)     Temp Source 01/14/20 1720 Oral     SpO2 01/14/20 1712 98 %     Weight --      Height --      Head Circumference --      Peak Flow --      Pain Score --      Pain Loc --      Pain Edu? --      Excl. in [place]? --   TMAX(24)@       Latest  Blood pressure (!) 156/94, pulse 92, temperature 98.5 F (36.9 C), temperature source Oral, resp. rate 16, SpO2 95 %.    Review of Systems:    Pertinent positives include:    fatigue,  confusion  Constitutional:  No weight loss,  night sweats, Fevers, chills,weight loss  HEENT:  No headaches, Difficulty swallowing,Tooth/dental problems,Sore throat,  No sneezing, itching, ear ache, nasal congestion, post nasal drip,  Cardio-vascular:  No chest pain, Orthopnea, PND, anasarca, dizziness, palpitations.no Bilateral lower extremity swelling  GI:  No heartburn, indigestion, abdominal pain, nausea, vomiting, diarrhea, change in bowel habits, loss of appetite, melena, blood in stool, hematemesis Resp:  no shortness of breath at rest. No dyspnea on exertion, No excess mucus, no productive cough, No non-productive cough, No coughing up of blood.No change in color of mucus.No wheezing. Skin:  no rash or lesions. No jaundice GU:  no dysuria, change in color of urine, no urgency or frequency. No straining to urinate.   No flank pain.  Musculoskeletal:  No joint pain or no joint swelling. No decreased range of motion. No back pain.  Psych:  No change in mood or affect. No depression or anxiety. No memory loss.  Neuro: no localizing neurological complaints, no tingling, no weakness, no double vision, no gait abnormality, no slurred speech, no  All systems reviewed and apart from [place] all are negative  Past Medical History:   Past Medical History:  Diagnosis Date  . Cancer ([place])    skin cancer   . History of radiation therapy 01/09/19- 01/19/19   SRS brain radiation. 5 fractions to total 25 Gy  . Hyperlipidemia   . Hypertension   . IBS (irritable bowel syndrome)   . Osteoporosis   . PONV (postoperative nausea and vomiting)    with breast surgery  . SOBOE (shortness of breath on exertion)       Past Surgical History:  Procedure Laterality Date  . APPLICATION OF CRANIAL NAVIGATION N/A 09/07/2017   Procedure: APPLICATION OF CRANIAL NAVIGATION;  Surgeon: Kary Kos, MD;  Location: [facility];  Service: Neurosurgery;  Laterality: N/A;  . APPLICATION OF CRANIAL NAVIGATION Bilateral 11/09/2019   Procedure: APPLICATION OF CRANIAL NAVIGATION;  Surgeon: Kary Kos, MD;  Location: [facility];  Service: Neurosurgery;  Laterality: Bilateral;  . BRAIN SURGERY  09/2017  . BUNIONECTOMY    . COLONOSCOPY     [facility] Dr Amedeo Plenty probably about 10 years ago  . CRANIOTOMY N/A 09/07/2017   Procedure: CRAINIOTOMY BI CORONAL w/BRAINLAB;  Surgeon: Kary Kos, MD;  Location: [facility];  Service: Neurosurgery;  Laterality: N/A;  . CRANIOTOMY Bilateral 11/09/2019   Procedure: Craniotomy - bilateral - Frontal stereotactic;  Surgeon: Kary Kos, MD;  Location: [facility];  Service: Neurosurgery;  Laterality: Bilateral;  . EYE SURGERY Bilateral    cataract surgery  . SKIN CANCER EXCISION    . WISDOM TOOTH EXTRACTION      Social History:  Ambulatory  Independently      reports that she has never smoked. She has never used smokeless tobacco.  She reports that she does not drink alcohol and does not use drugs.   Family History:   Family History  Problem Relation Age of Onset  . Breast cancer Mother   . Heart disease Father   . Colon cancer Maternal Aunt     Allergies: Allergies  Allergen Reactions  . Bee Venom Swelling and Other (See Comments)    Site of swelling not recalled by family- happened during childhood  . Losartan Itching     Prior to Admission medications   Medication Sig Start Date End Date Taking? Authorizing Provider  acetaminophen (TYLENOL) 325 MG tablet Take 325-650 mg by mouth every 6 (six) hours as needed for mild pain or headache.  [provider]  apixaban (ELIQUIS) 5 MG TABS tablet Take 1 tablet (5 mg total) by mouth 2 (two) times daily. 12/24/19   Copland, Gay Filler, MD  aspirin EC 81 MG tablet Take 81 mg by mouth daily. Swallow whole.    [provider]  ezetimibe (ZETIA) 10 MG tablet Take 1 tablet (10 mg total) by mouth daily. 08/01/19   Copland, Gay Filler, MD  furosemide (LASIX) 20 MG tablet Take 1 tablet (20 mg total) by mouth daily. Take one in the am. May take a 2nd dose around noon if desired 12/25/19   Copland, Gay Filler, MD  levETIRAcetam (KEPPRA) 500 MG tablet Take 1 tablet (500 mg total) by mouth 2 (two) times daily. 10/25/19   Little Ishikawa, MD  potassium chloride (KLOR-CON) 10 MEQ tablet TAKE 1 TABLET BY MOUTH DAILY, ON DAYS THAT YOU USE THE LASIX 12/25/19   Copland, Gay Filler, MD  Prenatal MV & Min w/FA-DHA (PRENATAL ADULT GUMMY/DHA/FA PO) Take 1 tablet by mouth daily.     [provider]   Physical Exam: Vitals with BMI 01/14/2020 01/14/2020 01/14/2020  Height - - -  Weight - - -  BMI - - -  Systolic 962 229 798  Diastolic 94 90 90  Pulse 92 94 94     1. General:  in No Acute distress    Chronically ill   -appearing 2. Psychological: somnolent not Oriented 3. Head/ENT:   Dry Mucous Membranes                          Head Non traumatic, neck  supple                           Poor Dentition 4. SKIN:  decreased Skin turgor,  Skin clean Dry and intact no rash 5. Heart: Regular rate and rhythm no   Murmur, no Rub or gallop 6. Lungs: Clear to auscultation bilaterally, no wheezes or crackles   7. Abdomen: Soft,  non-tender, Non distended  bowel sounds present 8. Lower extremities: no clubbing, cyanosis, no  edema 9. Neurologically unable to cooperate, confused 10. MSK: Normal range of motion   All other LABS:     Recent Labs  Lab 01/14/20 1735  WBC 11.9*  NEUTROABS 8.9*  HGB 13.6  HCT 43.9  MCV 100.0  PLT 403*     Recent Labs  Lab 01/14/20 1735  NA 144  K 4.1  CL 103  CO2 26  GLUCOSE 89  BUN 15  CREATININE 0.85  CALCIUM 9.4     Recent Labs  Lab 01/14/20 1735  AST 30  ALT 18  ALKPHOS 66  BILITOT 1.0  PROT 6.9  ALBUMIN 3.1*       Cultures: No results found for: SDES, SPECREQUEST, CULT, REPTSTATUS   Radiological Exams on Admission: CT HEAD WO CONTRAST  Result Date: 01/14/2020 CLINICAL DATA:  Delirium. EXAM: CT HEAD WITHOUT CONTRAST TECHNIQUE: Contiguous axial images were obtained from the base of the skull through the vertex without intravenous contrast. COMPARISON:  MRI of the brain January 11, 2020. FINDINGS: Brain: Postsurgical changes from bifrontal craniotomy for left frontal extra-axial tumor resection. Persistent large surgical cavity with thick nodular margins with the fluid portion measuring approximately 5.0 x 4.6 x 2.7 cm, similar to prior MRI. Mass effect with mild rightward deviation of the anterior aspect of the cerebral falx (5 mm). Stable appearance of  prominent edema within the white matter of the bilateral frontal lobes, including corpus callosum. Unchanged appearance of extra-axial lesion centered in the left sphenoid wing/cavernous sinus. No hydrocephalus or intracranial hemorrhage. Posterior fossa structures appear preserved. Vascular: No hyperdense vessel sign. Skull: Postsurgical  changes from bilateral frontal craniotomy. Sinuses/Orbits: Mucosal thickening of the left sphenoid sinus. The orbits are maintained. IMPRESSION: 1. Postsurgical changes from bilateral frontal craniotomy for extra-axial tumor resection. Persistent large surgical cavity with thick nodular margins with the fluid portion measuring up to 5.0 cm, similar to prior MRI. Stable appearance of prominent edema within the white matter of the bilateral frontal lobes, including corpus callosum. 2. Unchanged appearance of extra-axial lesion centered in the left sphenoid wing/cavernous sinus. Electronically Signed   By: Pedro Earls M.D.   On: 01/14/2020 19:01   DG Chest Port 1 View  Result Date: 01/14/2020 CLINICAL DATA:  Sepsis EXAM: PORTABLE CHEST 1 VIEW COMPARISON:  12/24/2019 FINDINGS: Heart and mediastinal contours are within normal limits. No focal opacities or effusions. No acute bony abnormality. IMPRESSION: No active disease. Electronically Signed   By: Rolm Baptise M.D.   On: 01/14/2020 20:43    Chart has been reviewed   Assessment/Plan   75 y.o. female with medical history significant of left frontal meningioma and cavernous sinus meningioma status post resection, off of steroids, on Keppra for concern for seizures, HLD, IBS, HTN osteoporosis Patient is on Eliquis  PE in November Admitted for acute encephalopathy in the setting of progressive meningioma  Present on Admission: . Acute encephalopathy -   - most likely multifactorial secondary to combination of progressive meningioma versus seizure activity versus dehydration secondary to decreased by mouth intake,  EEG in a.m. Initiate steroids and Keppra  - Will rehydrate  No evidence of  infection  At this time       - neurological exam appears to be nonfocal but patient unable to cooperate fully   - VBG unremarkable no evidence of hypercarbia    - no history of liver disease ammonia mildly elevated in the 50s unclear if is  contributing  . Meningioma ([place])  Keppra load followed by Keppra 750 twice a day EEG in a.m. seizure precautions Steroids Decadron 10 mg now and 4 mg twice daily Neurology consulted  will email Dr. Mickeal Skinner . Hyperlipidemia -chronic stable continue Zetia when able to tolerate   . SIRS (systemic inflammatory response syndrome) (HCC) -   -SIRS criteria met with  elevated white blood cell count,       Component Value Date/Time   WBC 11.9 (H) 01/14/2020 1735   LYMPHSABS 1.7 01/14/2020 1735     fever was reported by EMS 100.4 vitals since arrival: Today's Vitals   01/14/20 2045 01/14/20 2100 01/14/20 2145 01/14/20 2200  BP: (!) 155/90 (!) 156/94 (!) 173/95   Pulse: 94 92 98   Resp: $Remo'16 16 13   'JZmlq$ Temp:      TempSrc:      SpO2: 95% 95% 94%   Weight:    76 kg  Height:    '5\' 2"'$  (1.575 m)    SIRS criteria were due to unclear etiology but At this Time no source of infection and SEPSIS IS RULED OUT   Continue to monitor for any evidence of infection  . History of pulmonary embolus (PE) -transition from Eliquis to heparin discussed with neurology meningioma is unlikely to cause bleeding CT head showed no evidence of hemorrhage at this time  . Chronic diastolic CHF (  congestive heart failure) ([place]) - currently appears to be slightly on the dry side, hold home diuretics for tonight and restart when appears euvolemic, carefuly follow fluid status and Cr     Other plan as per orders.  DVT prophylaxis:  Heparin drip    Code Status:    Code Status: Prior FULL CODE  Will need to confirm code status with family in AM   Family Communication:   Family not at  Bedside    Disposition Plan:   To home once workup is complete and patient is stable   Following barriers for discharge:                             Menatal status improving                                 white count improving                              Will need to be able to tolerate PO                            Will likely need  home health,                             Will need consultants to evaluate patient prior to discharge                         Would benefit from PT/OT eval prior to DC  Ordered                   Swallow eval - SLP ordered                                      Transition of care consulted                   Nutrition    consulted                 Consults called: Neurology is aware  Admission status:  ED Disposition    ED Disposition Condition [place]: [facility] [[id number]]  Level of Care: Telemetry Medical [104]  May admit patient to [facility] or [facility] if equivalent level of care is available:: No  Covid Evaluation: Confirmed COVID Negative  Diagnosis: Acute encephalopathy [326712]  Admitting Physician: Toy Baker [[id number]]  Attending Physician: Toy Baker [[id number]]  Estimated length of stay: past midnight tomorrow  Certification:: I certify this patient will need inpatient services for at least 2 midnights         inpatient     I Expect 2 midnight stay secondary to severity of patient's current illness need for inpatient interventions justified by the following:    Severe lab/radiological/exam abnormalities including:    abnormal head CT and extensive comorbidities including:     CHF    history of brain tumor with residual deficits .   Marland Kitchen Chronic anticoagulation  That are currently affecting medical management.   I expect  patient to be hospitalized  for 2 midnights requiring inpatient medical care.  Patient is at high risk for adverse outcome (such as loss of life or disability) if not treated.  Indication for inpatient stay as follows:  Severe change from baseline regarding mental status     inability to maintain oral hydration    Need for operative/procedural  intervention    Need for IV   IV fluids,  IV anticoagulation    Level of care    tele  For 12H   Lab Results  Component Value Date    [place] 11/06/2019     Precautions: admitted as  Covid Negative   No active isolations    PPE: Used by the provider:   P100  eye Goggles,  Gloves     [name] [name] 01/15/2020, 3:09 AM    [facility]     after 2 AM please page floor coverage PA If 7AM-7PM, please contact the day team taking care of the patient using [email or web address]   Patient was evaluated in the context of the global COVID-19 pandemic, which necessitated consideration that the patient might be at risk for infection with the SARS-CoV-2 virus that causes COVID-19. Institutional protocols and algorithms that pertain to the evaluation of patients at risk for COVID-19 are in a state of rapid change based on information released by regulatory bodies including the CDC and federal and state organizations. These policies and algorithms were followed during the patient's care.

## 2020-01-15 ENCOUNTER — Inpatient Hospital Stay (HOSPITAL_COMMUNITY): Payer: Medicare Other

## 2020-01-15 ENCOUNTER — Inpatient Hospital Stay: Payer: Medicare Other | Admitting: Internal Medicine

## 2020-01-15 DIAGNOSIS — G4089 Other seizures: Secondary | ICD-10-CM | POA: Diagnosis not present

## 2020-01-15 DIAGNOSIS — I2699 Other pulmonary embolism without acute cor pulmonale: Secondary | ICD-10-CM | POA: Diagnosis not present

## 2020-01-15 DIAGNOSIS — G9340 Encephalopathy, unspecified: Secondary | ICD-10-CM | POA: Diagnosis not present

## 2020-01-15 DIAGNOSIS — C700 Malignant neoplasm of cerebral meninges: Secondary | ICD-10-CM

## 2020-01-15 DIAGNOSIS — I5032 Chronic diastolic (congestive) heart failure: Secondary | ICD-10-CM | POA: Diagnosis not present

## 2020-01-15 LAB — APTT
aPTT: 84 seconds — ABNORMAL HIGH (ref 24–36)
aPTT: 129 seconds — ABNORMAL HIGH (ref 24–36)
aPTT: 92 seconds — ABNORMAL HIGH (ref 24–36)

## 2020-01-15 LAB — COMPREHENSIVE METABOLIC PANEL
ALT: 17 U/L (ref 0–44)
AST: 18 U/L (ref 15–41)
Albumin: 2.9 g/dL — ABNORMAL LOW (ref 3.5–5.0)
Alkaline Phosphatase: 62 U/L (ref 38–126)
Anion gap: 17 — ABNORMAL HIGH (ref 5–15)
BUN: 16 mg/dL (ref 8–23)
CO2: 21 mmol/L — ABNORMAL LOW (ref 22–32)
Calcium: 9.2 mg/dL (ref 8.9–10.3)
Chloride: 106 mmol/L (ref 98–111)
Creatinine, Ser: 0.8 mg/dL (ref 0.44–1.00)
GFR, Estimated: >60 mL/min (ref >60)
Glucose, Bld: 126 mg/dL — ABNORMAL HIGH (ref 70–99)
Potassium: 3.6 mmol/L (ref 3.5–5.1)
Sodium: 144 mmol/L (ref 135–145)
Total Bilirubin: 1.1 mg/dL (ref 0.3–1.2)
Total Protein: 6.6 g/dL (ref 6.5–8.1)

## 2020-01-15 LAB — CBC WITH DIFFERENTIAL/PLATELET
Abs Immature Granulocytes: 0.08 10*3/uL — ABNORMAL HIGH (ref 0.00–0.07)
Basophils Absolute: 0.1 10*3/uL (ref 0.0–0.1)
Basophils Relative: 0 %
Eosinophils Absolute: 0 10*3/uL (ref 0.0–0.5)
Eosinophils Relative: 0 %
HCT: 40.8 % (ref 36.0–46.0)
Hemoglobin: 13.1 g/dL (ref 12.0–15.0)
Immature Granulocytes: 1 %
Lymphocytes Relative: 6 %
Lymphs Abs: 0.7 10*3/uL (ref 0.7–4.0)
MCH: 32.3 pg (ref 26.0–34.0)
MCHC: 32.1 g/dL (ref 30.0–36.0)
MCV: 100.5 fL — ABNORMAL HIGH (ref 80.0–100.0)
Monocytes Absolute: 0.1 10*3/uL (ref 0.1–1.0)
Monocytes Relative: 1 %
Neutro Abs: 10.7 10*3/uL — ABNORMAL HIGH (ref 1.7–7.7)
Neutrophils Relative %: 92 %
Platelets: 379 10*3/uL (ref 150–400)
RBC: 4.06 MIL/uL (ref 3.87–5.11)
RDW: 12.6 % (ref 11.5–15.5)
WBC: 11.6 10*3/uL — ABNORMAL HIGH (ref 4.0–10.5)
nRBC: 0 % (ref 0.0–0.2)

## 2020-01-15 LAB — TSH: TSH: 0.629 u[IU]/mL (ref 0.350–4.500)

## 2020-01-15 LAB — CK: Total CK: 72 U/L (ref 38–234)

## 2020-01-15 LAB — MAGNESIUM
Magnesium: 2.3 mg/dL (ref 1.7–2.4)
Magnesium: 2.2 mg/dL (ref 1.7–2.4)

## 2020-01-15 LAB — HEPARIN LEVEL (UNFRACTIONATED): Heparin Unfractionated: >2.2 IU/mL — ABNORMAL HIGH (ref 0.30–0.70)

## 2020-01-15 LAB — PREALBUMIN: Prealbumin: 12.7 mg/dL — ABNORMAL LOW (ref 18–38)

## 2020-01-15 LAB — PHOSPHORUS
Phosphorus: 3.9 mg/dL (ref 2.5–4.6)
Phosphorus: 4.4 mg/dL (ref 2.5–4.6)

## 2020-01-15 NOTE — ED Notes (Signed)
Upon assessment of patient, this RN found heparin drip to be running at 1000 units/hr (25mL/hr). Per pharmacy note, correct dose is currently infusing.

## 2020-01-15 NOTE — Progress Notes (Signed)
[place] for Heparin Indication: Anticiagulation while holding Apixaban.   Allergies  Allergen Reactions  . Bee Venom Swelling and Other (See Comments)    Site of swelling not recalled by family- happened during childhood  . Losartan Itching    Patient Measurements: Height: 5\' 2"  (157.5 cm) Weight: 76 kg (167 lb 8.8 oz) IBW/kg (Calculated) : 50.1 Heparin Dosing Weight: 66.6kg  Vital Signs: Temp: 98.3 F (36.8 C) (01/11 1018) Temp Source: Oral (01/11 1018) BP: 134/77 (01/11 1900) Pulse Rate: 98 (01/11 1900)  Labs: Recent Labs    01/14/20 1735 01/14/20 2218 01/15/20 0308 01/15/20 1100 01/15/20 1947  HGB 13.6 10.5* 13.1  --   --   HCT 43.9 31.0* 40.8  --   --   PLT 403*  --  379  --   --   APTT  --   --  84* 129* 92*  LABPROT 17.9*  --   --   --   --   INR 1.5*  --   --   --   --   HEPARINUNFRC  --   --  >2.20*  --   --   CREATININE 0.85  --  0.80  --   --   CKTOTAL 72  --   --   --   --     Estimated Creatinine Clearance: 60.7 mL/min (by C-G formula based on SCr of 0.8 mg/dL).  Assessment: 52 yof admitted for AMS and concerns for seizures likely 2/2 expanding left frontal meningioma. Patient is on Apixaban PTA for a recent PE in November. Pharmacy has been asked to assist with transition to heparin 2/2 AMS and possible neuro interventions.Last dose of Apixaban is unknown.  aPTT is therapeutic at 92 on 1000 units/hr. Heparin level elevated this am, as expected, due to influence of apixaban. No bleeding noted.    Goal of Therapy:  aPTT 66-102 seconds Monitor platelets by anticoagulation protocol: Yes   Plan:  Continue heparin drip at 1000 units/hr Monitor daily CBC, heparin level/aPTT, s/sx bleeding  Thank you for involving pharmacy in this patient's care.  Renold Genta, PharmD, BCPS Clinical Pharmacist Clinical phone for 01/15/2020 until 10p is [phone] 01/15/2020 9:23 PM  **Pharmacist phone directory can be found on  [email or web address] listed under [place]**

## 2020-01-15 NOTE — Procedures (Signed)
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 EEG to evaluate for seizure   Level of alertness: Awake  AEDs during EEG study: LEV  Technical aspects: This EEG study was done with scalp electrodes positioned according to the 10-20 International system of electrode placement. Electrical activity was acquired at a sampling rate of 500Hz  and reviewed with a high frequency filter of 70Hz  and a low frequency filter of 1Hz . EEG data were recorded continuously and digitally stored.   Description: The posterior dominant rhythm consists of 9-10 Hz activity of moderate voltage (25-35 uV) seen predominantly in posterior head regions, symmetric and reactive to eye opening and eye closing. EEG showed 3 to 5 Hz theta-delta slowing in bifrontal region with sharply contoured morphology.  Sharp waves were also noted in bifrontal region.  Hyperventilation and photic stimulation were not performed.     ABNORMALITY -Sharp waves, bifrontal, left>right -Continuous slow, bifrontal region - Breech artifact, bifrontal region  IMPRESSION: This study showed evidence of epileptogenicity as well as cortical dysfunction arising from bifrontal ( left>right) region likely secondary to underlying structural abnormality as well as craniotomy.  No seizures were seen throughout the recording.  Syair Fricker [name]

## 2020-01-15 NOTE — Progress Notes (Addendum)
Neurology Progress Note   S:// Pt is seen and examined today.  Patient cannot tell her name but can repeat sentences and name simple objects.   O://   Current vital signs: BP (!) 162/93   Pulse 83   Temp 98 F (36.7 C) (Oral)   Resp 18   Ht 5\' 2"  (1.575 m)   Wt 76 kg   SpO2 98%   BMI 30.65 kg/m  Vital signs in last 24 hours: Temp:  [98 F (36.7 C)-98.5 F (36.9 C)] 98 F (36.7 C) (01/11 0700) Pulse Rate:  [78-105] 83 (01/11 0930) Resp:  [12-20] 18 (01/11 0930) BP: (107-173)/(76-101) 162/93 (01/11 0930) SpO2:  [92 %-98 %] 98 % (01/11 0930) Weight:  [76 kg] 76 kg (01/10 2200) GENERAL: Awake, alert in NAD HEENT: - Normocephalic and atraumatic LUNGS -symmetric chest rise, normal respiratory effort CV -no JVD, no peripheral edema. ABDOMEN - Soft, non distended Ext: warm, well perfused, no peripheral edema NEURO:  Mental Status: Alert, not oriented to self. Not able to tell date.  Not able to tell where she is.  Poor attention, slow in responding questions. Language: speech is mildly dysarthric.Able to name simple objects, good repetition , slow in responding. Cranial Nerves: PERRL, EOMI, visual fields full on right side but does not blink on threat on left side, face symmetric with no weakness,  hearing intact Motor: Patient is able to lift both hands against gravity with mild drift on right side.  Mild right upper extremity weakness 3+/5 in RUE and 4+/5 LUE in strength. Able to lift both legs against gravity but can lift left leg more than right. Rt LE 4+/5 and Lt LE 5/5 Tone: is normal and bulk is normal Sensation- Intact to touch Coordination:difficult to assess Gait- deferred   Medications  Current Facility-Administered Medications:  .  acetaminophen (TYLENOL) tablet 650 mg, 650 mg, Oral, Q6H PRN **OR** acetaminophen (TYLENOL) suppository 650 mg, 650 mg, Rectal, Q6H PRN, Doutova, Anastassia, MD .  dexamethasone (DECADRON) injection 4 mg, 4 mg, Intravenous, Q12H,  Doutova, Anastassia, MD, 4 mg at 01/15/20 [id number] .  ezetimibe (ZETIA) tablet 10 mg, 10 mg, Oral, Daily, Doutova, Anastassia, MD .  heparin ADULT infusion 100 units/mL (25000 units/264mL), 1,150 Units/hr, Intravenous, Continuous, Duanne Limerick, RPH, Last Rate: 11.5 mL/hr at 01/14/20 2257, 1,150 Units/hr at 01/14/20 2257 .  levETIRAcetam (KEPPRA) tablet 750 mg, 750 mg, Oral, BID, Doutova, Anastassia, MD, 750 mg at 01/15/20 [id number] .  ondansetron (ZOFRAN) tablet 4 mg, 4 mg, Oral, Q6H PRN **OR** ondansetron (ZOFRAN) injection 4 mg, 4 mg, Intravenous, Q6H PRN, Doutova, Anastassia, MD  Current Outpatient Medications:  .  acetaminophen (TYLENOL) 325 MG tablet, Take 325-650 mg by mouth every 6 (six) hours as needed for mild pain or headache., Disp: , Rfl:  .  apixaban (ELIQUIS) 5 MG TABS tablet, Take 1 tablet (5 mg total) by mouth 2 (two) times daily., Disp: 60 tablet, Rfl: 9 .  aspirin EC 81 MG tablet, Take 81 mg by mouth daily. Swallow whole., Disp: , Rfl:  .  ezetimibe (ZETIA) 10 MG tablet, Take 1 tablet (10 mg total) by mouth daily., Disp: 90 tablet, Rfl: 1 .  furosemide (LASIX) 20 MG tablet, Take 1 tablet (20 mg total) by mouth daily. Take one in the am. May take a 2nd dose around noon if desired, Disp: 60 tablet, Rfl: 3 .  levETIRAcetam (KEPPRA) 500 MG tablet, Take 1 tablet (500 mg total) by mouth 2 (two) times daily., Disp: 60 tablet,  Rfl: 0 .  potassium chloride (KLOR-CON) 10 MEQ tablet, TAKE 1 TABLET BY MOUTH DAILY, ON DAYS THAT YOU USE THE LASIX, Disp: 90 tablet, Rfl: 1 .  Prenatal MV & Min w/FA-DHA (PRENATAL ADULT GUMMY/DHA/FA PO), Take 1 tablet by mouth daily. , Disp: , Rfl:  Labs CBC    Component Value Date/Time   WBC 11.6 (H) 01/15/2020 0308   RBC 4.06 01/15/2020 0308   HGB 13.1 01/15/2020 0308   HCT 40.8 01/15/2020 0308   PLT 379 01/15/2020 0308   MCV 100.5 (H) 01/15/2020 0308   MCH 32.3 01/15/2020 0308   MCHC 32.1 01/15/2020 0308   RDW 12.6 01/15/2020 0308   LYMPHSABS 0.7 01/15/2020  0308   MONOABS 0.1 01/15/2020 0308   EOSABS 0.0 01/15/2020 0308   BASOSABS 0.1 01/15/2020 0308    CMP     Component Value Date/Time   NA 144 01/15/2020 0308   NA 141 07/15/2015 0000   NA 141 07/15/2015 0000   K 3.6 01/15/2020 0308   CL 106 01/15/2020 0308   CO2 21 (L) 01/15/2020 0308   GLUCOSE 126 (H) 01/15/2020 0308   BUN 16 01/15/2020 0308   BUN 22 (A) 07/15/2015 0000   BUN 22 (A) 07/15/2015 0000   CREATININE 0.80 01/15/2020 0308   CREATININE 0.84 12/26/2018 1217   CALCIUM 9.2 01/15/2020 0308   PROT 6.6 01/15/2020 0308   ALBUMIN 2.9 (L) 01/15/2020 0308   AST 18 01/15/2020 0308   ALT 17 01/15/2020 0308   ALKPHOS 62 01/15/2020 0308   BILITOT 1.1 01/15/2020 0308   GFRNONAA >60 01/15/2020 0308   GFRNONAA >60 12/26/2018 1217   GFRAA >60 12/26/2018 1217    glycosylated hemoglobin  Lipid Panel     Component Value Date/Time   CHOL 261 (H) 10/23/2019 0318   TRIG 115 10/23/2019 0318   HDL 56 10/23/2019 0318   CHOLHDL 4.7 10/23/2019 0318   VLDL 23 10/23/2019 0318   LDLCALC 182 (H) 10/23/2019 0318   LDLDIRECT 157.0 03/15/2019 1338     Imaging I have reviewed images in epic and the results pertinent to this consultation are: CBC    Component Value Date/Time   WBC 11.6 (H) 01/15/2020 0308   RBC 4.06 01/15/2020 0308   HGB 13.1 01/15/2020 0308   HCT 40.8 01/15/2020 0308   PLT 379 01/15/2020 0308   MCV 100.5 (H) 01/15/2020 0308   MCH 32.3 01/15/2020 0308   MCHC 32.1 01/15/2020 0308   RDW 12.6 01/15/2020 0308   LYMPHSABS 0.7 01/15/2020 0308   MONOABS 0.1 01/15/2020 0308   EOSABS 0.0 01/15/2020 0308   BASOSABS 0.1 01/15/2020 0308    CT-scan of the brain MRI examination of the brain DG Chest 2 View  Result Date: 12/24/2019 CLINICAL DATA:  Follow-up chest film 12/12/2019. Pulmonary infarct suspected. EXAM: CHEST - 2 VIEW COMPARISON:  Chest x-ray 12/12/2019, CT chest 12/04/2019 FINDINGS: The heart size and mediastinal contours are within normal limits. Biapical  pleural/pulmonary scarring. Interval resolution of peripheral right upper lobe airspace opacity. Bibasilar compressive changes. No new focal consolidation. No pulmonary edema. No pleural effusion. No pneumothorax. No acute osseous abnormality. Multilevel degenerative changes of the spine with a dextrocurvature of the lumbar spine and compensatory levocurvature of the thoracolumbar spine. IMPRESSION: 1. Interval resolution of previously noted right upper lobe airspace opacity. 2. Persistent bibasilar compressive changes with otherwise no acute cardiopulmonary disease. Electronically Signed   By: Iven Finn M.D.   On: 12/24/2019 15:45   CT HEAD WO  CONTRAST  Result Date: 01/14/2020 CLINICAL DATA:  Delirium. EXAM: CT HEAD WITHOUT CONTRAST TECHNIQUE: Contiguous axial images were obtained from the base of the skull through the vertex without intravenous contrast. COMPARISON:  MRI of the brain January 11, 2020. FINDINGS: Brain: Postsurgical changes from bifrontal craniotomy for left frontal extra-axial tumor resection. Persistent large surgical cavity with thick nodular margins with the fluid portion measuring approximately 5.0 x 4.6 x 2.7 cm, similar to prior MRI. Mass effect with mild rightward deviation of the anterior aspect of the cerebral falx (5 mm). Stable appearance of prominent edema within the white matter of the bilateral frontal lobes, including corpus callosum. Unchanged appearance of extra-axial lesion centered in the left sphenoid wing/cavernous sinus. No hydrocephalus or intracranial hemorrhage. Posterior fossa structures appear preserved. Vascular: No hyperdense vessel sign. Skull: Postsurgical changes from bilateral frontal craniotomy. Sinuses/Orbits: Mucosal thickening of the left sphenoid sinus. The orbits are maintained. IMPRESSION: 1. Postsurgical changes from bilateral frontal craniotomy for extra-axial tumor resection. Persistent large surgical cavity with thick nodular margins with the  fluid portion measuring up to 5.0 cm, similar to prior MRI. Stable appearance of prominent edema within the white matter of the bilateral frontal lobes, including corpus callosum. 2. Unchanged appearance of extra-axial lesion centered in the left sphenoid wing/cavernous sinus. Electronically Signed   By: Pedro Earls M.D.   On: 01/14/2020 19:01   MR BRAIN W WO CONTRAST  Result Date: 01/11/2020 CLINICAL DATA:  History of meningioma resection. Assess treatment response. EXAM: MRI HEAD WITHOUT AND WITH CONTRAST TECHNIQUE: Multiplanar, multiecho pulse sequences of the brain and surrounding structures were obtained without and with intravenous contrast. CONTRAST:  7.62mL GADAVIST GADOBUTROL 1 MMOL/ML IV SOLN COMPARISON:  MRI head 11/10/2019 FINDINGS: Brain: Bifrontal craniotomy for tumor resection. The anterior falx has been removed. There is a large postoperative fluid collection in the left frontal lobe crossing the midline to the right. There is extensive nodular enhancement along the wall of the surgical cavity which has developed since the prior study and is compatible with locally recurrent tumor. Extensive edema in the frontal lobes bilaterally with progression. Mild mass-effect on the left frontal horn. Second meningioma in the left cavernous sinus is stable. This is a bulky tumor encasing the left carotid artery with extension into the sella and suprasellar cistern. Ventricle size normal. Negative for acute infarct. Chronic blood products in the left frontal lobe from prior surgery. Vascular: Normal arterial flow voids. There is encasement of the left cavernous carotid which retains normal flow void. Skull and upper cervical spine: 1 cm enhancing lesion in the right parietal bone unchanged. This is hyperintense on T2 and FLAIR. This is hypointense on T1 prior to contrast. Sinuses/Orbits: Paranasal sinuses clear. Bilateral cataract extraction Other: None IMPRESSION: 1. Postop resection of  bifrontal meningioma. Large surgical cavity is present in the left frontal lobe. There is extensive nodular enhancement around the wall of the cavity compatible with the current meningioma. This has grown rapidly since the prior MRI 11/10/2019. Mild progression of bifrontal white matter edema. 2. Bulky left cavernous carotid meningioma stable. Electronically Signed   By: Franchot Gallo M.D.   On: 01/11/2020 16:21   DG Chest Port 1 View  Result Date: 01/14/2020 CLINICAL DATA:  Sepsis EXAM: PORTABLE CHEST 1 VIEW COMPARISON:  12/24/2019 FINDINGS: Heart and mediastinal contours are within normal limits. No focal opacities or effusions. No acute bony abnormality. IMPRESSION: No active disease. Electronically Signed   By: Rolm Baptise M.D.   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 EEG to evaluate for seizure  Level of alertness: Awake AEDs during EEG study: LEV Technical aspects: This EEG study was done with scalp electrodes positioned according to the 10-20 International system of electrode placement. Electrical activity was acquired at a sampling rate of 500Hz  and reviewed with a high frequency filter of 70Hz  and a low frequency filter of 1Hz . EEG data were recorded continuously and digitally stored. Description: The posterior dominant rhythm consists of 9-10 Hz activity of moderate voltage (25-35 uV) seen predominantly in posterior head regions, symmetric and reactive to eye opening and eye closing. EEG showed 3  to 5 Hz theta-delta slowing in bifrontal region with sharply contoured morphology.  Sharp waves were also noted in bifrontal region.  Hyperventilation and photic stimulation were not performed.    ABNORMALITY -Sharp waves, bifrontal, left>right -Continuous slow, bifrontal region - Breech artifact, bifrontal region  IMPRESSION: This study showed evidence of epileptogenicity as well as cortical dysfunction arising from bifrontal ( left>right) region likely secondary to underlying structural abnormality as well as craniotomy.  No seizures were seen throughout the recording.  Lora Havens   Assessment: Pt is a 75 year old female  PMH of Left Frontal Meningioma and Cavernous sinus meningioma s/p resection presented with altered mental status x 7-10 days, concern for seizures.  MRI revealed recurrence of Lt frontal Meningioma and bifrontal edema  which has grown rapidly since the prior MRI 11/10/2019.  Her symptoms are likely due to worsening of left frontal meningioma. Patient received loading dose of Decadron 10 mg and Keppra 2000mg .  Currently, patient is on Decadron 4 mg IV twice daily and Keppra 750 mg twice daily.  EEG showed evidence of epileptogenicity as well as cortical dysfunction arising from bifrontal (left>right). No seizures reported during EEG and since admission.  mpression: Meningioma Likely recurrence of left frontal meningioma Underlying seizure disorder  Recommendations:  -Continue Keppra 750 mg twice daily -Continue Decadron 4 mg IV twice daily -Order long-term EEG. -Continue telemetry -Seizure precautions  Per [facility] statutes, patients with seizures are not allowed to drive until they have been seizure-free for six months.   Use caution when using heavy equipment or power tools. Avoid working on ladders or at heights. Take showers instead of baths. Ensure the water temperature is not too high on the home water heater. Do not go swimming alone. Do not lock yourself  in a room alone (i.e. bathroom). When caring for infants or small children, sit down when holding, feeding, or changing them to minimize risk of injury to the child in the event you have a seizure. Maintain good sleep hygiene. Avoid alcohol.   If patient has another seizure, call 911 and bring them back to the ED if: A. The seizure lasts longer than 5 minutes.  B. The patient doesn't wake shortly after the seizure or has new problems such as difficulty seeing, speaking or moving following the seizure C. The patient was injured during the seizure D. The patient has a temperature over 102 F (39C)  E. The patient vomited during the seizure and now is having trouble breathing  Attending addendum Patient seen and examined independently Imaging reviewed Discussed with Dr. Mickeal Skinner as well. Dr. Mickeal Skinner is not very certain that the tumor progression alone can explain her sudden deterioration He will be in to see the patient at some point today. At this time, continue Decadron 4 mg IV twice daily, continue Keppra at current doses. In my discussion with Dr. Mickeal Skinner, he would like to make sure that the patient's not having intermittent seizures prior to having goals of care conversations. We will order LTM EEG. -- Amie Portland, MD Neurologist [facility] Pager: [phone]

## 2020-01-15 NOTE — ED Notes (Signed)
Lunch Tray Ordered @ 1715. 

## 2020-01-15 NOTE — Progress Notes (Signed)
PROGRESS NOTE  [name] [name] JEH:[id number] DOB: 1945-12-23 DOA: 01/14/2020 PCP: Darreld Mclean, MD  Brief History   [name] [name] is a 75 y.o. 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 Prior to this she was performing her ADLs. Family denies slurred speech, but she has trouble finding words for sentences. The patient had a MRI done 3 days ago that showed recurrence of left frontal meningioma at the resection site.   Pt unable to provide her own hx. She is undergoing EEG as I enter her bay. It has since been read and it demonstrated evidence of epileptogenicity as well as cortical dysfunction arising from bifrontal lobes (left >right). No seizures were seen during EEG or during hospital stay.  Neurology has recommended continuation of keppra 750 mg bid, decadron 4 mg IV bid, LTM EEG, telemetry monitoring, and seizure precautions.  Dr. Rory Percy also spoke with Dr. Mickeal Skinner, the patient's neurosurgeon. Dr. Mickeal Skinner apparently does not feel that the progression of the meningioma alone can explain the patient's symptoms. Concern is for intermittent seizures. He will see that patient himself.   [facility] were consulted to admit the patient for further evaluation and treatment. Neurology was consulted.   Consultants  . Neurology . Neurosurgery  Procedures  . EEG  Antibiotics   Anti-infectives (From admission, onward)   None    .  Subjective  The patient is lying on the gurney quietly. EEG is underway.  Objective   Vitals:  Vitals:   01/15/20 1130 01/15/20 1200  BP: (!) 164/86 (!) 144/92  Pulse: (!) 101 95  Resp: (!) 22 10  Temp:    SpO2: 95% 95%   Exam:  Constitutional:   . The patient is awake, alert, and oriented x 3. No acute distress. Respiratory:  . No increased work of breathing. . No wheezes, rales, or rhonchi . No tactile fremitus Cardiovascular:  . Regular rate and rhythm . No murmurs, ectopy, or gallups. . No lateral PMI. No thrills. Abdomen:  . Abdomen is soft, non-tender, non-distended . No hernias, masses, or organomegaly . Normoactive bowel sounds.  Musculoskeletal:  . No cyanosis, clubbing, or edema Skin:  . No rashes, lesions, ulcers . palpation of skin: no induration or nodules Neurologic:  . Unable to evaluate as the patient is unable to cooperate with exam. Psychiatric:  . Unable to evaluate as the patient is unable to cooperate with exam.  I have personally reviewed the following:   Today's Data  . Vitals, CMP, CBC, TSH  Micro Data  . Blood culture x 1  Imaging  . CXR  Cardiology Data  . EKG  Scheduled Meds: . dexamethasone (DECADRON) injection  4 mg Intravenous Q12H  . ezetimibe  10 mg Oral Daily  . levETIRAcetam  750 mg Oral BID   Continuous Infusions: . heparin 10 Units/hr (01/15/20 1242)    Active Problems:   Hyperlipidemia   Meningioma ([place])   Acute metabolic encephalopathy   Acute encephalopathy   Epilepsia ([place])   SIRS (systemic inflammatory response syndrome) ([place])   History of pulmonary embolus ([place])   Chronic diastolic CHF (congestive heart failure) ([place])   LOS: 1 day    A & P   Acute encephalopathy:  Seems to be due at least in part to recurrence of meningioma and seizure activity. The patient is also dehydrated due to poor PO intake. She is receiving IV fluids.   Recurrent meningioma: As per neurology instructions the patient will be admitted to a telemetry bed. She will be on seizure precautions. She has been continued on Keppra 750 mg bid and decodron 10 mg IV bid. Dr. Mickeal Skinner to evaluate.  Hyperlipidemia: Continue Zetia when the patient is able to tolerate PO.  Volume depletion:  Due  to poor PO intake.  Failure to thrive: Reported poor PO intake in recent weeks.  SIRS: On admission due to elevated WBC and fever. However, this is resolving with hydration. Believe it is likely due to hemoconcentration alone. Fever of 100.4 may have been due to seizure activity. No source of infection has been identified.   History of pulmonary embolus: The patient is on a heparin drip due to perceived risk of bleeding due to hemangioma. Dr. Rory Percy feels that this is unlikely.   Chronic diastolic CHF: Monitor volume status. Most recent echocardiogram was 06/21/2018. It demosntrated Ef 98-33%, and diastolic parameters consistent with impaired relaxation. Normal RV systolic function.   This patient has been seen and examined by me. I have spent 38 minutes in her evaluation and care.  DVT Prophylaxis: SCD's Code Status: Full Code Family Communication: None available Disposition: Status is: Inpatient  Remains inpatient appropriate because:Inpatient level of care appropriate due to severity of illness   Dispo: The patient is from: Home              Anticipated d/c is to: Home              Anticipated d/c date is: 2 days              Patient currently is not medically stable to d/c.  Fayette Hamada, DO [facility] Direct contact: see [email or web address]  7PM-7AM contact night coverage as above 01/15/2020, 2:14 PM  LOS: 1 day

## 2020-01-15 NOTE — Consult Note (Signed)
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 female presented with several days of rapidly declining functional status, described by her fiance.  As recently as the holidays she was fully functional and independent, and over recent days has developed impaired speech and loss of function of her right side.  She has become fully dependent on him/others for all activities of living.  No report of illness, fever, or any other systemic complaints.  She was able to obtain her brain MRI last week.   MEDICAL HISTORY:  Past Medical History:  Diagnosis Date  . Cancer ([place])    skin cancer   . History of radiation therapy 01/09/19- 01/19/19   SRS brain radiation. 5 fractions to total 25 Gy  . Hyperlipidemia   . Hypertension   . IBS (irritable bowel syndrome)   . Osteoporosis   . PONV (postoperative nausea and vomiting)    with breast surgery  . SOBOE (shortness of breath on exertion)     SURGICAL HISTORY: Past Surgical History:  Procedure Laterality Date  . APPLICATION OF CRANIAL NAVIGATION N/A 09/07/2017   Procedure: APPLICATION OF CRANIAL NAVIGATION;  Surgeon: Kary Kos, MD;  Location: [facility];  Service: Neurosurgery;  Laterality: N/A;  . APPLICATION OF CRANIAL NAVIGATION Bilateral 11/09/2019   Procedure: APPLICATION OF CRANIAL NAVIGATION;  Surgeon: Kary Kos, MD;  Location: [facility];  Service: Neurosurgery;  Laterality: Bilateral;  . BRAIN SURGERY  09/2017  . BUNIONECTOMY    . COLONOSCOPY     [facility] Dr Amedeo Plenty probably about 10 years ago  . CRANIOTOMY N/A 09/07/2017   Procedure: CRAINIOTOMY BI CORONAL w/BRAINLAB;  Surgeon: Kary Kos, MD;  Location: [facility];  Service: Neurosurgery;  Laterality: N/A;  . CRANIOTOMY Bilateral 11/09/2019   Procedure: Craniotomy - bilateral  - Frontal stereotactic;  Surgeon: Kary Kos, MD;  Location: [facility];  Service: Neurosurgery;  Laterality: Bilateral;  . EYE SURGERY Bilateral    cataract surgery  . SKIN CANCER EXCISION    . WISDOM TOOTH EXTRACTION      SOCIAL HISTORY: Social History   Socioeconomic History  . Marital status: Divorced    Spouse name: Not on file  . Number of children: Not on file  . Years of education: Not on file  . Highest education level: Not on file  Occupational History  . Not on file  Tobacco Use  . Smoking status: Never Smoker  . Smokeless tobacco: Never Used  Vaping Use  . Vaping Use: Never used  Substance and Sexual Activity  . Alcohol use: No  . Drug use: No  . Sexual activity: Not on file  Other Topics Concern  . Not on file  Social History Narrative   Lives home with home.  Not working.  Education:  HS grad.  [name] Fiance.     Social Determinants of Health   Financial Resource Strain: Not on file  Food Insecurity: Not on file  Transportation Needs: Not on file  Physical Activity: Not on file  Stress: Not on file  Social Connections: Not on file  Intimate Partner Violence: Not on file    FAMILY HISTORY: Family History  Problem Relation Age of Onset  . Breast cancer Mother   . Heart disease Father   . Colon cancer Maternal Aunt  ALLERGIES:  is allergic to bee venom and losartan.  MEDICATIONS:  Current Facility-Administered Medications  Medication Dose Route Frequency Provider Last Rate Last Admin  . acetaminophen (TYLENOL) tablet 650 mg  650 mg Oral Q6H PRN Toy Baker, MD       Or  . acetaminophen (TYLENOL) suppository 650 mg  650 mg Rectal Q6H PRN Doutova, Anastassia, MD      . dexamethasone (DECADRON) injection 4 mg  4 mg Intravenous Q12H Doutova, Anastassia, MD   4 mg at 01/15/20 [id number]  . ezetimibe (ZETIA) tablet 10 mg  10 mg Oral Daily Doutova, Anastassia, MD   10 mg at 01/15/20 1053  . heparin ADULT infusion 100 units/mL (25000 units/268mL)   1,000 Units/hr Intravenous Continuous von Dohlen, Haley B, RPH 0.5 mL/hr at 01/15/20 1242 10 Units/hr at 01/15/20 1242  . levETIRAcetam (KEPPRA) tablet 750 mg  750 mg Oral BID Toy Baker, MD   750 mg at 01/15/20 [id number]  . ondansetron (ZOFRAN) tablet 4 mg  4 mg Oral Q6H PRN Doutova, Anastassia, MD       Or  . ondansetron (ZOFRAN) injection 4 mg  4 mg Intravenous Q6H PRN Toy Baker, MD       Current Outpatient Medications  Medication Sig Dispense Refill  . acetaminophen (TYLENOL) 325 MG tablet Take 325-650 mg by mouth every 6 (six) hours as needed for mild pain or headache.    Marland Kitchen apixaban (ELIQUIS) 5 MG TABS tablet Take 1 tablet (5 mg total) by mouth 2 (two) times daily. 60 tablet 9  . aspirin EC 81 MG tablet Take 81 mg by mouth daily. Swallow whole.    . ezetimibe (ZETIA) 10 MG tablet Take 1 tablet (10 mg total) by mouth daily. 90 tablet 1  . furosemide (LASIX) 20 MG tablet Take 1 tablet (20 mg total) by mouth daily. Take one in the am. May take a 2nd dose around noon if desired 60 tablet 3  . levETIRAcetam (KEPPRA) 500 MG tablet Take 1 tablet (500 mg total) by mouth 2 (two) times daily. 60 tablet 0  . potassium chloride (KLOR-CON) 10 MEQ tablet TAKE 1 TABLET BY MOUTH DAILY, ON DAYS THAT YOU USE THE LASIX 90 tablet 1  . Prenatal MV & Min w/FA-DHA (PRENATAL ADULT GUMMY/DHA/FA PO) Take 1 tablet by mouth daily.       REVIEW OF SYSTEMS:   Limited by dysphasia   PHYSICAL EXAMINATION: Vitals:   01/15/20 1130 01/15/20 1200  BP: (!) 164/86 (!) 144/92  Pulse: (!) 101 95  Resp: (!) 22 10  Temp:    SpO2: 95% 95%   KPS: 50. General: Awake, drowsy.  EEG leads in place Head: EEG leads EENT: No conjunctival injection or scleral icterus. Oral mucosa moist Lungs: Resp effort normal Cardiac: Regular rate and rhythm Abdomen: Soft, non-distended abdomen Skin: No rashes cyanosis or petechiae. Extremities: No clubbing or edema  NEUROLOGIC EXAM: Mental Status: Awake, alert and  attentive to examiner with directed stimulation only.  Oriented to self but not environment. Language is densely impaired with regards to fluency, also impaired comprehension. Cranial Nerves: Visual acuity is grossly normal. Visual fields are full. Extra-ocular movements intact. No ptosis. Face is symmetric Motor: Tone and bulk are normal. Impaired power on right side, no sustained antigravity function of right arm or leg.   There may be component of motor neglect.  Reflexes are symmetric, no pathologic reflexes present. Sensory: Grossly intact to light touch and temperature Gait: Non-ambulatory   LABORATORY DATA:  I have reviewed the data as listed Lab Results  Component Value Date   WBC 11.6 (H) 01/15/2020   HGB 13.1 01/15/2020   HCT 40.8 01/15/2020   MCV 100.5 (H) 01/15/2020   PLT 379 01/15/2020   Recent Labs    10/22/19 1151 10/24/19 0505 12/04/19 0930 12/24/19 1523 01/14/20 1735 01/14/20 2218 01/15/20 0308  NA 141   < > 139 144 144 147* 144  K 3.6   < > 3.7 4.2 4.1 3.1* 3.6  CL 105   < > 101 103 103  --  106  CO2 23   < > 28 32 26  --  21*  GLUCOSE 103*   < > 78 86 89  --  126*  BUN 14   < > 22 14 15   --  16  CREATININE 0.84   < > 0.66 0.98 0.85  --  0.80  CALCIUM 9.2   < > 8.6* 9.7 9.4  --  9.2  GFRNONAA >60   < > >60  --  >60  --  >60  PROT 6.7  --   --   --  6.9  --  6.6  ALBUMIN 3.4*  --   --   --  3.1*  --  2.9*  AST 30  --   --   --  30  --  18  ALT 31  --   --   --  18  --  17  ALKPHOS 51  --   --   --  66  --  62  BILITOT 0.9  --   --   --  1.0  --  1.1   < > = values in this interval not displayed.    RADIOGRAPHIC STUDIES: I have personally reviewed the radiological images as listed and agreed with the findings in the report. DG Chest 2 View  Result Date: 12/24/2019 CLINICAL DATA:  Follow-up chest film 12/12/2019. Pulmonary infarct suspected. EXAM: CHEST - 2 VIEW COMPARISON:  Chest x-ray 12/12/2019, CT chest 12/04/2019 FINDINGS: The heart size and  mediastinal contours are within normal limits. Biapical pleural/pulmonary scarring. Interval resolution of peripheral right upper lobe airspace opacity. Bibasilar compressive changes. No new focal consolidation. No pulmonary edema. No pleural effusion. No pneumothorax. No acute osseous abnormality. Multilevel degenerative changes of the spine with a dextrocurvature of the lumbar spine and compensatory levocurvature of the thoracolumbar spine. IMPRESSION: 1. Interval resolution of previously noted right upper lobe airspace opacity. 2. Persistent bibasilar compressive changes with otherwise no acute cardiopulmonary disease. Electronically Signed   By: Iven Finn M.D.   On: 12/24/2019 15:45   CT HEAD WO CONTRAST  Result Date: 01/14/2020 CLINICAL DATA:  Delirium. EXAM: CT HEAD WITHOUT CONTRAST TECHNIQUE: Contiguous axial images were obtained from the base of the skull through the vertex without intravenous contrast. COMPARISON:  MRI of the brain January 11, 2020. FINDINGS: Brain: Postsurgical changes from bifrontal craniotomy for left frontal extra-axial tumor resection. Persistent large surgical cavity with thick nodular margins with the fluid portion measuring approximately 5.0 x 4.6 x 2.7 cm, similar to prior MRI. Mass effect with mild rightward deviation of the anterior aspect of the cerebral falx (5 mm). Stable appearance of prominent edema within the white matter of the bilateral frontal lobes, including corpus callosum. Unchanged appearance of extra-axial lesion centered in the left sphenoid wing/cavernous sinus. No hydrocephalus or intracranial hemorrhage. Posterior fossa structures appear preserved. Vascular: No hyperdense vessel sign. Skull: Postsurgical changes from bilateral frontal  craniotomy. Sinuses/Orbits: Mucosal thickening of the left sphenoid sinus. The orbits are maintained. IMPRESSION: 1. Postsurgical changes from bilateral frontal craniotomy for extra-axial tumor resection. Persistent large  surgical cavity with thick nodular margins with the fluid portion measuring up to 5.0 cm, similar to prior MRI. Stable appearance of prominent edema within the white matter of the bilateral frontal lobes, including corpus callosum. 2. Unchanged appearance of extra-axial lesion centered in the left sphenoid wing/cavernous sinus. Electronically Signed   By: Pedro Earls M.D.   On: 01/14/2020 19:01   MR BRAIN W WO CONTRAST  Result Date: 01/11/2020 CLINICAL DATA:  History of meningioma resection. Assess treatment response. EXAM: MRI HEAD WITHOUT AND WITH CONTRAST TECHNIQUE: Multiplanar, multiecho pulse sequences of the brain and surrounding structures were obtained without and with intravenous contrast. CONTRAST:  7.104mL GADAVIST GADOBUTROL 1 MMOL/ML IV SOLN COMPARISON:  MRI head 11/10/2019 FINDINGS: Brain: Bifrontal craniotomy for tumor resection. The anterior falx has been removed. There is a large postoperative fluid collection in the left frontal lobe crossing the midline to the right. There is extensive nodular enhancement along the wall of the surgical cavity which has developed since the prior study and is compatible with locally recurrent tumor. Extensive edema in the frontal lobes bilaterally with progression. Mild mass-effect on the left frontal horn. Second meningioma in the left cavernous sinus is stable. This is a bulky tumor encasing the left carotid artery with extension into the sella and suprasellar cistern. Ventricle size normal. Negative for acute infarct. Chronic blood products in the left frontal lobe from prior surgery. Vascular: Normal arterial flow voids. There is encasement of the left cavernous carotid which retains normal flow void. Skull and upper cervical spine: 1 cm enhancing lesion in the right parietal bone unchanged. This is hyperintense on T2 and FLAIR. This is hypointense on T1 prior to contrast. Sinuses/Orbits: Paranasal sinuses clear. Bilateral cataract extraction  Other: None IMPRESSION: 1. Postop resection of bifrontal meningioma. Large surgical cavity is present in the left frontal lobe. There is extensive nodular enhancement around the wall of the cavity compatible with the current meningioma. This has grown rapidly since the prior MRI 11/10/2019. Mild progression of bifrontal white matter edema. 2. Bulky left cavernous carotid meningioma stable. Electronically Signed   By: Franchot Gallo M.D.   On: 01/11/2020 16:21   DG Chest Port 1 View  Result Date: 01/14/2020 CLINICAL DATA:  Sepsis EXAM: PORTABLE CHEST 1 VIEW COMPARISON:  12/24/2019 FINDINGS: Heart and mediastinal contours are within normal limits. No focal opacities or effusions. No acute bony abnormality. IMPRESSION: No active disease. Electronically Signed   By: Rolm Baptise M.D.   On: 01/14/2020 20:43   EEG adult  Result Date: 01/15/2020 Lora Havens, MD     01/15/2020 10:27 AM Patient Name: Camarie Mctigue MRN: [id number] Epilepsy Attending: Lora Havens Referring Physician/Provider: Dr Leonie Man Date: 01/15/2020 Duration: 25.56 mins Patient history: 75 year old woman with bilateral frontal meningiomas worse on the left status post multiple craniotomies for recurrence, presenting for 7 to 10 days worth of altered mental status, difficulty with ambulation and confusion along with word finding difficulty. EEG to evaluate for seizure  Level of alertness: Awake AEDs during EEG study: LEV Technical aspects: This EEG study was done with scalp electrodes positioned according to the 10-20 International system of electrode placement. Electrical activity was acquired at a sampling rate of 500Hz  and reviewed with a high frequency filter of 70Hz  and a low frequency filter of 1Hz . EEG data  were recorded continuously and digitally stored. Description: The posterior dominant rhythm consists of 9-10 Hz activity of moderate voltage (25-35 uV) seen predominantly in posterior head regions, symmetric and  reactive to eye opening and eye closing. EEG showed 3 to 5 Hz theta-delta slowing in bifrontal region with sharply contoured morphology.  Sharp waves were also noted in bifrontal region.  Hyperventilation and photic stimulation were not performed.    ABNORMALITY -Sharp waves, bifrontal, left>right -Continuous slow, bifrontal region - Breech artifact, bifrontal region  IMPRESSION: This study showed evidence of epileptogenicity as well as cortical dysfunction arising from bifrontal ( left>right) region likely secondary to underlying structural abnormality as well as craniotomy.  No seizures were seen throughout the recording.  Priyanka [name]    ASSESSMENT & PLAN:  WHO Grade III Meningioma, Recurrent  Tanicia Weatherwax presents today with significant focal and global neurologic decompensation.  Last evaluation in the clinic by her PCP on 12/20 made no mention of any functional impairments.    The speed and depth of the decline feel out of proportion to the changes seen on MRI scan from 01/11/20.  True, there is clearly recurrence and regrowth of enhancing tumor in the left frontal lobe which could explain such a decline.  Both frontal lobes are affected by T2 signal abnormality, but that had been the case on prior scan from 11/10/19.      Plans for post-operative radiation were delayed because of breakthrough seizures, and then pulmonary embolism.  She would be a candidate for salvage radiation, and potentially systemic agent like Bevacizumab, if she were able to make improvement in functional status.  If not able to improve, we would then likely recommend palliative measures only.  We would like to continue to evaluate for systemic or non-oncologic etiology to account for decline in neurologic status.  Workup thus far has been unfortunately unremarkable. Long term EEG would be helpful to rule out occult seizures as etiology.      It would be reasonable to involve palliative care, given uncertainty in  outcome of workup and potential need for comfort measures.  We have been in communication with her significant other, [name] [name], well known to [place] from clinic encounters.   Will continue to follow along expectantly for develops in this challenging case.  Please feel free to call/text/page with questions, updates, concerns.  All questions were answered. The patient knows to call the clinic with any problems, questions or concerns.  The total time spent in the encounter was 55 minutes and more than 50% was on counseling and review of test results     Ventura Sellers, MD 01/15/2020 3:42 PM

## 2020-01-15 NOTE — ED Notes (Signed)
Lunch Tray Ordered @ [id number].

## 2020-01-15 NOTE — Progress Notes (Signed)
Pt was seen for mobility on side of bed, and was able to assist to get to sit up on side of bed.   Her sit balance is strongly listing to R side, but with cues can be assisted to L side.  However, does not like that feeling and will need work on midline awareness, strength of LE's esp RLE and to increase her control of balance in transition  to stand.   01/15/20 1400  PT Visit Information  Last PT Received On 01/15/20  Assistance Needed +2  History of Present Illness 75 yo female with onset of AMS and suspected seizures was brought to ED, has noted acute encephalopathy and history of recurrent frontal B meningiomas with craniotomies for returns.  PMHx:  meningiomas frontal lobes, skin CA, radiation of brain, HTN, HLD, PONV, IBS, sob, osteoporosis, steroid use,  Precautions  Precautions Fall  Precaution Comments seizure precautions  Restrictions  Weight Bearing Restrictions No  Other Position/Activity Restrictions has recently required walker and has sinking down episodes in standing per husband  Home Living  Family/patient expects to be discharged to: Private residence  Living Arrangements Spouse/significant other  Available Help at Discharge Family;Available 24 hours/day  Type of Home House  Home Access Stairs to enter  Home Layout Two level  Bathroom Shower/Tub Walk-in Designer, television/film set Standard  Bathroom Accessibility Yes  Home Equipment Walker - 2 wheels  Additional Comments significant other cannot assist pt well since she has started to sink down and fall on walker  Prior Function  Level of Independence Needs assistance  Gait / Transfers Assistance Needed walked without walker but recently has to use walker, cannot climb stairs last 4 days  ADL's / Zena has significant other to assist all care  Comments moving slowly and limited by fatigue. reports increase in falls  Communication  Communication No difficulties  Pain Assessment  Pain Assessment  Faces  Faces Pain Scale 0  Cognition  Arousal/Alertness Lethargic  Behavior During Therapy Flat affect  Overall Cognitive Status Impaired/Different from baseline  Area of Impairment Problem solving;Awareness;Safety/judgement;Following commands;Memory;Attention;Orientation  Orientation Level Situation  Current Attention Level Selective  Memory Decreased recall of precautions;Decreased short-term memory  Following Commands Follows one step commands inconsistently;Follows one step commands with increased time  Safety/Judgement Decreased awareness of safety;Decreased awareness of deficits  Awareness Intellectual  Problem Solving Slow processing;Requires verbal cues;Requires tactile cues  General Comments pt is on side of bed with PT and as she fatigued became harder to get her to move on command  Upper Extremity Assessment  Upper Extremity Assessment Generalized weakness  Lower Extremity Assessment  Lower Extremity Assessment Generalized weakness  Cervical / Trunk Assessment  Cervical / Trunk Assessment  (general weakness)  Bed Mobility  Overal bed mobility Needs Assistance  Bed Mobility Supine to Sit;Sit to Supine  Supine to sit Max assist  Sit to supine Max assist  General bed mobility comments RLE is especially weak and unsteady  Transfers  Overall transfer level Needs assistance  Transfers Sit to/from Stand  Sit to Stand Total assist  General transfer comment unable to stand but is fatigued quite fast from mobility  Ambulation/Gait  General Gait Details unable  Balance  Overall balance assessment Needs assistance  Sitting-balance support Feet supported  Sitting balance-Leahy Scale Poor  Sitting balance - Comments requires mod assist to maintain sit balance but occas min assist  General Comments  General comments (skin integrity, edema, etc.) pt is motivated to try to help sit  up but uses LUE to reach for the footstool handle and try to support herself that way  Exercises   Exercises Other exercises (RLE 3/5, LLE 4-)  PT - End of Session  Activity Tolerance Patient limited by fatigue;Treatment limited secondary to medical complications (Comment)  Patient left in bed;with call bell/phone within reach;with family/visitor present  Nurse Communication Mobility status  PT Assessment  PT Recommendation/Assessment Patient needs continued PT services  PT Visit Diagnosis Muscle weakness (generalized) (M62.81);Other abnormalities of gait and mobility (R26.89);Difficulty in walking, not elsewhere classified (R26.2)  PT Problem List Decreased strength;Decreased range of motion;Decreased activity tolerance;Decreased balance;Decreased mobility;Decreased coordination;Decreased cognition;Decreased knowledge of use of DME;Decreased safety awareness;Cardiopulmonary status limiting activity  Barriers to Discharge Decreased caregiver support;Inaccessible home environment  Barriers to Discharge Comments home with only one person and has stairs  PT Plan  PT Frequency (ACUTE ONLY) Min 4X/week  PT Treatment/Interventions (ACUTE ONLY) DME instruction;Gait training;Stair training;Functional mobility training;Therapeutic activities;Therapeutic exercise;Balance training;Neuromuscular re-education;Patient/family education  AM-PAC PT "6 Clicks" Mobility Outcome Measure (Version 2)  Help needed turning from your back to your side while in a flat bed without using bedrails? 3  Help needed moving from lying on your back to sitting on the side of a flat bed without using bedrails? 2  Help needed moving to and from a bed to a chair (including a wheelchair)? 2  Help needed standing up from a chair using your arms (e.g., wheelchair or bedside chair)? 1  Help needed to walk in hospital room? 1  Help needed climbing 3-5 steps with a railing?  1  6 Click Score 10  Consider Recommendation of Discharge To: CIR/SNF/LTACH  PT Recommendation  Recommendations for Other Services Rehab consult  Follow Up  Recommendations CIR  PT equipment None recommended by PT  Individuals Consulted  Consulted and Agree with Results and Recommendations Patient;Family member/caregiver  Family Member Consulted significant other  Acute Rehab PT Goals  Patient Stated Goal did not state  PT Goal Formulation With patient/family  Time For Goal Achievement 01/22/20  Potential to Achieve Goals Fair  PT Time Calculation  PT Start Time (ACUTE ONLY) 1222  PT Stop Time (ACUTE ONLY) 1244  PT Time Calculation (min) (ACUTE ONLY) 22 min  PT General Charges  $$ ACUTE PT VISIT 1 Visit  PT Evaluation  $PT Eval Moderate Complexity 1 Mod  Written Expression  Dominant Hand Right    Mee Hives, PT MS Acute Rehab Dept. Number: [place] [facility] [place]

## 2020-01-15 NOTE — Evaluation (Signed)
Clinical/Bedside Swallow Evaluation Patient Details  Name: [name] [name] MRN: [id number] Date of Birth: [birth year]  Today's Date: 01/15/2020 Time: SLP Start Time (ACUTE ONLY): 0940 SLP Stop Time (ACUTE ONLY): 1000 SLP Time Calculation (min) (ACUTE ONLY): 20 min  Past Medical History:  Past Medical History:  Diagnosis Date  . Cancer ([place])    skin cancer   . History of radiation therapy 01/09/19- 01/19/19   SRS brain radiation. 5 fractions to total 25 Gy  . Hyperlipidemia   . Hypertension   . IBS (irritable bowel syndrome)   . Osteoporosis   . PONV (postoperative nausea and vomiting)    with breast surgery  . SOBOE (shortness of breath on exertion)    Past Surgical History:  Past Surgical History:  Procedure Laterality Date  . APPLICATION OF CRANIAL NAVIGATION N/A 09/07/2017   Procedure: APPLICATION OF CRANIAL NAVIGATION;  Surgeon: Kary Kos, MD;  Location: [place];  Service: Neurosurgery;  Laterality: N/A;  . APPLICATION OF CRANIAL NAVIGATION Bilateral 11/09/2019   Procedure: APPLICATION OF CRANIAL NAVIGATION;  Surgeon: Kary Kos, MD;  Location: [place];  Service: Neurosurgery;  Laterality: Bilateral;  . BRAIN SURGERY  09/2017  . BUNIONECTOMY    . COLONOSCOPY     [facility] Dr Amedeo Plenty probably about 10 years ago  . CRANIOTOMY N/A 09/07/2017   Procedure: CRAINIOTOMY BI CORONAL w/BRAINLAB;  Surgeon: Kary Kos, MD;  Location: [place];  Service: Neurosurgery;  Laterality: N/A;  . CRANIOTOMY Bilateral 11/09/2019   Procedure: Craniotomy - bilateral - Frontal stereotactic;  Surgeon: Kary Kos, MD;  Location: [place];  Service: Neurosurgery;  Laterality: Bilateral;  . EYE SURGERY Bilateral    cataract surgery  . SKIN CANCER EXCISION    . WISDOM TOOTH EXTRACTION     HPI:  [name] [name] is a 75 y.o. 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 Presented with  progressive confusion and weakness and difficulty walking for the past 10 days.  Prior to this she was performing her ADLs. Family denies slurred speech  But she has trouble finding words for sentences. She had MRI done 3 days ago showed recurrence of left frontal meningioma at the resection site.   Assessment / Plan / Recommendation Clinical Impression  Pts swallow appears grossly within functional limits. No overt s/sx of aspiration. 3 oz water challenge was unremarkable. Pt with slowed bolus formation and delayed oral transit, supsected to be related to cognitive deficits. Recommend regular thin liquid diet with medicines as tolerated. No further ST needs identified.      Aspiration Risk  Mild aspiration risk    Diet Recommendation   Regular thin liquids   Medication Administration: Whole meds with puree    Other  Recommendations     Follow up Recommendations   N\A     Frequency and Duration   N/A         Prognosis Barriers to Reach Goals: Cognitive deficits      Swallow Study   General Date of Onset: 01/14/20 HPI: [name] [name] is a 75 y.o. 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 Presented with  progressive confusion and weakness and difficulty  walking for the past 10 days.  Prior to this she was performing her ADLs. Family denies slurred speech  But she has trouble finding words for sentences. She had MRI done 3 days ago showed recurrence of left frontal meningioma at the resection site. Type of Study: Bedside Swallow Evaluation Previous Swallow Assessment: none on file Diet Prior to this Study: NPO Temperature Spikes Noted: No Respiratory Status: Room air History of Recent Intubation: No Behavior/Cognition:  Alert;Requires cueing Oral Cavity Assessment: Within Functional Limits Oral Care Completed by SLP: No Oral Cavity - Dentition: Adequate natural dentition Vision: Functional for self-feeding Self-Feeding Abilities: Needs set up Volitional Swallow: Unable to elicit    Oral/Motor/Sensory Function Overall Oral Motor/Sensory Function: Within functional limits   Ice Chips Ice chips: Within functional limits Presentation: Spoon   Thin Liquid Thin Liquid: Within functional limits Presentation: Cup;Straw    Nectar Thick Nectar Thick Liquid: Not tested   Honey Thick Honey Thick Liquid: Not tested   Puree Puree: Within functional limits   Solid     Solid: Impaired Presentation: Self Fed Oral Phase Functional Implications: Prolonged oral transit      Joletta Manner E Ferrin Liebig MA, CCC-SLP Acute Rehabilitation Services 01/15/2020,10:09 AM

## 2020-01-15 NOTE — Progress Notes (Signed)
LTM started, new leads used. No skin break down noted. Pt event button tested.  RN educated regarding event button and transfer info.

## 2020-01-15 NOTE — Progress Notes (Signed)
[place] for Heparin Indication: Anticiagulation while holding Apixaban.   Allergies  Allergen Reactions  . Bee Venom Swelling and Other (See Comments)    Site of swelling not recalled by family- happened during childhood  . Losartan Itching    Patient Measurements: Height: 5\' 2"  (157.5 cm) Weight: 76 kg (167 lb 8.8 oz) IBW/kg (Calculated) : 50.1 Heparin Dosing Weight: 66.6kg  Vital Signs: Temp: 98.3 F (36.8 C) (01/11 1018) Temp Source: Oral (01/11 1018) BP: 144/92 (01/11 1200) Pulse Rate: 95 (01/11 1200)  Labs: Recent Labs    01/14/20 1735 01/14/20 2218 01/15/20 0308 01/15/20 1100  HGB 13.6 10.5* 13.1  --   HCT 43.9 31.0* 40.8  --   PLT 403*  --  379  --   APTT  --   --  84* 129*  LABPROT 17.9*  --   --   --   INR 1.5*  --   --   --   HEPARINUNFRC  --   --  >2.20*  --   CREATININE 0.85  --  0.80  --   CKTOTAL 72  --   --   --     Estimated Creatinine Clearance: 60.7 mL/min (by C-G formula based on SCr of 0.8 mg/dL).   Medical History: Past Medical History:  Diagnosis Date  . Cancer ([place])    skin cancer   . History of radiation therapy 01/09/19- 01/19/19   SRS brain radiation. 5 fractions to total 25 Gy  . Hyperlipidemia   . Hypertension   . IBS (irritable bowel syndrome)   . Osteoporosis   . PONV (postoperative nausea and vomiting)    with breast surgery  . SOBOE (shortness of breath on exertion)     Medications:  Scheduled:  . dexamethasone (DECADRON) injection  4 mg Intravenous Q12H  . ezetimibe  10 mg Oral Daily  . levETIRAcetam  750 mg Oral BID    Assessment: 15 yof admitted for AMS and concerns for seizures likely 2/2 expanding left frontal meningioma. Patient is on Apixaban PTA for a recent PE in November. Pharmacy has been asked to assist with transition to heparin 2/2 AMS and possible neuro interventions.Last dose of Apixaban is unknown.  Confirmatory aPTT is elevated at 129 after previously  therapeutic - drawn appropriately from opposite arm. Heparin level elevated, as expected, due to influence of apixaban. CBC wnl. No bleeding or issues with infusion per discussion with RN.   Goal of Therapy:  aPTT 66-102 seconds Monitor platelets by anticoagulation protocol: Yes   Plan:  Reduce heparin to 1000 units/hr Check 6hr aPTT Monitor daily CBC, heparin level/aPTT, s/sx bleeding   Arturo Morton, PharmD, BCPS Please check AMION for all [place] contact numbers Clinical Pharmacist 01/15/2020 12:22 PM

## 2020-01-15 NOTE — Progress Notes (Addendum)
ANTICOAGULATION CONSULT NOTE - Follow Up Consult  Pharmacy Consult for heparin Indication: recent h/o VTE  Labs: Recent Labs    01/14/20 1735 01/14/20 2218 01/15/20 0308  HGB 13.6 10.5* 13.1  HCT 43.9 31.0* 40.8  PLT 403*  --  379  APTT  --   --  84*  LABPROT 17.9*  --   --   INR 1.5*  --   --   CREATININE 0.85  --  0.80  CKTOTAL 72  --   --     Assessment/Plan:  75yo female therapeutic on heparin with initial dosing while Eliquis on hold. Will continue gtt at current rate and confirm stable with additional PTT.    Wynona Neat, PharmD, BCPS  01/15/2020,5:02 AM

## 2020-01-15 NOTE — Progress Notes (Signed)
EEG complete - results pending 

## 2020-01-16 ENCOUNTER — Inpatient Hospital Stay (HOSPITAL_COMMUNITY): Payer: Medicare Other

## 2020-01-16 ENCOUNTER — Other Ambulatory Visit: Payer: Self-pay

## 2020-01-16 ENCOUNTER — Encounter (HOSPITAL_COMMUNITY): Payer: Self-pay | Admitting: Internal Medicine

## 2020-01-16 DIAGNOSIS — G9340 Encephalopathy, unspecified: Secondary | ICD-10-CM | POA: Diagnosis not present

## 2020-01-16 DIAGNOSIS — I5032 Chronic diastolic (congestive) heart failure: Secondary | ICD-10-CM | POA: Diagnosis not present

## 2020-01-16 LAB — CBC
HCT: 39.1 % (ref 36.0–46.0)
Hemoglobin: 12.3 g/dL (ref 12.0–15.0)
MCH: 30.8 pg (ref 26.0–34.0)
MCHC: 31.5 g/dL (ref 30.0–36.0)
MCV: 97.8 fL (ref 80.0–100.0)
Platelets: 370 10*3/uL (ref 150–400)
RBC: 4 MIL/uL (ref 3.87–5.11)
RDW: 12.4 % (ref 11.5–15.5)
WBC: 16.1 10*3/uL — ABNORMAL HIGH (ref 4.0–10.5)
nRBC: 0 % (ref 0.0–0.2)

## 2020-01-16 LAB — APTT
aPTT: 61 s — ABNORMAL HIGH (ref 24–36)
aPTT: 63 seconds — ABNORMAL HIGH (ref 24–36)
aPTT: 61 seconds — ABNORMAL HIGH (ref 24–36)

## 2020-01-16 LAB — BASIC METABOLIC PANEL
Anion gap: 11 (ref 5–15)
BUN: 18 mg/dL (ref 8–23)
CO2: 26 mmol/L (ref 22–32)
Calcium: 9.1 mg/dL (ref 8.9–10.3)
Chloride: 108 mmol/L (ref 98–111)
Creatinine, Ser: 0.75 mg/dL (ref 0.44–1.00)
GFR, Estimated: >60 mL/min (ref >60)
Glucose, Bld: 124 mg/dL — ABNORMAL HIGH (ref 70–99)
Potassium: 3.6 mmol/L (ref 3.5–5.1)
Sodium: 145 mmol/L (ref 135–145)

## 2020-01-16 LAB — HEPARIN LEVEL (UNFRACTIONATED): Heparin Unfractionated: 1.28 [IU]/mL — ABNORMAL HIGH (ref 0.30–0.70)

## 2020-01-16 LAB — URINE CULTURE: Culture: NO GROWTH

## 2020-01-16 MED ORDER — LORAZEPAM 2 MG/ML IJ SOLN: 2.0000 mg | Freq: Once | INTRAMUSCULAR | Status: DC | PRN

## 2020-01-16 MED ORDER — LEVETIRACETAM 750 MG PO TABS
1250.0000 mg | ORAL_TABLET | Freq: Two times a day (BID) | ORAL | Status: DC
  Administered 2020-01-17: 1250 mg via ORAL
  Filled 2020-01-16 (×2): qty 1

## 2020-01-16 MED ORDER — ENSURE ENLIVE PO LIQD
237.0000 mL | Freq: Two times a day (BID) | ORAL | Status: DC
  Administered 2020-01-17 – 2020-01-18 (×3): 237 mL via ORAL

## 2020-01-16 MED ORDER — HYDRALAZINE HCL 20 MG/ML IJ SOLN: 10.0000 mg | Freq: Four times a day (QID) | INTRAMUSCULAR | Status: DC | PRN

## 2020-01-16 MED ORDER — LEVETIRACETAM 750 MG PO TABS
1250.0000 mg | ORAL_TABLET | Freq: Two times a day (BID) | ORAL | Status: DC
  Filled 2020-01-16: qty 1

## 2020-01-16 MED ORDER — LEVETIRACETAM IN NACL 1500 MG/100ML IV SOLN
1500.0000 mg | Freq: Once | INTRAVENOUS | Status: AC
  Administered 2020-01-16: 1500 mg via INTRAVENOUS
  Filled 2020-01-16: qty 100

## 2020-01-16 MED ORDER — ADULT MULTIVITAMIN W/MINERALS CH
1.0000 | ORAL_TABLET | Freq: Every day | ORAL | Status: DC
  Administered 2020-01-16 – 2020-01-18 (×3): 1 via ORAL
  Filled 2020-01-16 (×3): qty 1

## 2020-01-16 NOTE — Progress Notes (Signed)
I was notified by Rachael RN regarding a change in neuro exam. Reportedly, pt had more right sided weakness, right droop, and visual changes on the right. Rachael also notified Dr. Curly Shores who came to bedside. Upon my arrival, [name] [name] is alert, disoriented to self, place, time and situation. She was able to state how old she was but could not state the month. She intermittently follows commands with some delay. She could stick her tongue out and close and open eyes to command. Right droop. Blinks to threat on Left but not on the right.Some Dysarthria and aphasia (unchanged). There is concern she may be seizing and her Keppra was increased today however she has not received the increased dose yet tonight.  RUE 2/5 LUE 5/5 RLE 1/5 LLE 5/5  Dr. Curly Shores came to bedside shortly after my exam. Some of her exam was improved from mine as stated in her note. Please see her note for the POC.

## 2020-01-16 NOTE — Progress Notes (Signed)
PT Cancellation Note  Patient Details Name: Smriti Barkow MRN: [id number] DOB: [birth year]   Cancelled Treatment:    Reason Eval/Treat Not Completed: Patient not medically ready. Pt currently on continuous EEG. PT to return as able once EEG disconnected to progress mobility.  Kittie Plater, PT, DPT Acute Rehabilitation Services Pager #: [phone] Office #: [phone]    Berline Lopes 01/16/2020, 7:40 AM

## 2020-01-16 NOTE — Progress Notes (Signed)
Inpatient Rehab Admissions Coordinator Note:   Per therapy recommendations, pt was screened for CIR candidacy by Evlyn Amason, MS CCC-SLP. At this time, Pt. Appears to have functional decline and is a good candidate for CIR. Will place order for rehab consult per protocol.  Please contact me with questions.   Lizett Chowning, MS, CCC-SLP Rehab Admissions Coordinator  [phone] (celll) [phone] (office)  

## 2020-01-16 NOTE — Plan of Care (Signed)

## 2020-01-16 NOTE — Progress Notes (Signed)
[place] [facility] for Heparin Indication: Anticiagulation while holding Apixaban.   Allergies  Allergen Reactions  . Bee Venom Swelling and Other (See Comments)    Site of swelling not recalled by family- happened during childhood  . Losartan Itching    Patient Measurements: Height: 5\' 2"  (157.5 cm) Weight: 76 kg (167 lb 8.8 oz) IBW/kg (Calculated) : 50.1 Heparin Dosing Weight: 66.6kg  Vital Signs: Temp: 98.4 F (36.9 C) (01/12 0030) Temp Source: Oral (01/12 0030) BP: 165/93 (01/12 0030) Pulse Rate: 79 (01/12 0030)  Labs: Recent Labs    01/14/20 1735 01/14/20 2218 01/15/20 0308 01/15/20 1100 01/15/20 1947 01/16/20 0222 01/16/20 0605  HGB 13.6 10.5* 13.1  --   --  12.3  --   HCT 43.9 31.0* 40.8  --   --  39.1  --   PLT 403*  --  379  --   --  370  --   APTT  --   --  84*   < > 92* 61* 63*  LABPROT 17.9*  --   --   --   --   --   --   INR 1.5*  --   --   --   --   --   --   HEPARINUNFRC  --   --  >2.20*  --   --  1.28*  --   CREATININE 0.85  --  0.80  --   --   --   --   CKTOTAL 72  --   --   --   --   --   --    < > = values in this interval not displayed.    Estimated Creatinine Clearance: 60.7 mL/min (by C-G formula based on SCr of 0.8 mg/dL).  Assessment: 70 yof admitted for AMS and concerns for seizures likely 2/2 expanding left frontal meningioma. Patient is on Apixaban PTA for a recent PE in November. Pharmacy has been asked to assist with transition to heparin 2/2 AMS and possible neuro interventions.Last dose of Apixaban is unknown.  aPTT is slightly sub-therapeutic at 63 (repeated after pausing) on 1000 units/hr. Heparin level remains elevated this am, as expected, due to influence of apixaban. No bleeding noted. CBC is stable and within normal limits.    Goal of Therapy:  aPTT 66-102 seconds Monitor platelets by anticoagulation protocol: Yes   Plan:  Increase heparin drip at 1050 units/hr Recheck aPTT in 8  hours Monitor daily CBC, heparin level/aPTT, s/sx bleeding  Thank you for involving pharmacy in this patient's care.  Sloan Leiter, PharmD, BCPS, BCCCP Clinical Pharmacist 01/16/2020 9:33 AM  **Pharmacist phone directory can be found on [email or web address] listed under [place]**

## 2020-01-16 NOTE — Progress Notes (Signed)
OT Cancellation Note  Patient Details Name: [name] [name] MRN: [id number] DOB: [birth year]   Cancelled Treatment:    Reason Eval/Treat Not Completed: Medical issues which prohibited therapy;Patient at procedure or test/ unavailable. Pt still connected to continuous EEG. Plan to re-attempt OT evaluation as appropriate.   Layla Maw 01/16/2020, 12:58 PM

## 2020-01-16 NOTE — Progress Notes (Signed)
[place] for Heparin Indication:  Anticoagulation While Holding Apixaban  Allergies  Allergen Reactions  . Bee Venom Swelling and Other (See Comments)    Site of swelling not recalled by family- happened during childhood  . Losartan Itching    Patient Measurements: Height: 5\' 2"  (157.5 cm) Weight: 76 kg (167 lb 8.8 oz) IBW/kg (Calculated) : 50.1 Heparin Dosing Weight: 66.6 kg  Vital Signs: Temp: 98.1 F (36.7 C) (01/12 1446) BP: 121/70 (01/12 1446) Pulse Rate: 95 (01/12 1446)  Labs: Recent Labs    01/14/20 1735 01/14/20 2218 01/15/20 0308 01/15/20 1100 01/16/20 0222 01/16/20 0605 01/16/20 1731  HGB 13.6 10.5* 13.1  --  12.3  --   --   HCT 43.9 31.0* 40.8  --  39.1  --   --   PLT 403*  --  379  --  370  --   --   APTT  --   --  84*   < > 61* 63* 61*  LABPROT 17.9*  --   --   --   --   --   --   INR 1.5*  --   --   --   --   --   --   HEPARINUNFRC  --   --  >2.20*  --  1.28*  --   --   CREATININE 0.85  --  0.80  --   --  0.75  --   CKTOTAL 72  --   --   --   --   --   --    < > = values in this interval not displayed.    Estimated Creatinine Clearance: 60.7 mL/min (by C-G formula based on SCr of 0.75 mg/dL).  Assessment: 75 yr old female admitted for AMS and concerns for seizure, likely 2/2 expanding left frontal meningioma. Patient was on apixaban PTA for a recent PE in November (last dose apixaban unknown). Pharmacy has been asked to assist with transition to heparin 2/2 AMS and possible neuro interventions.  APTT ~7 hrs after heparin infusion was increased to 1050 units/hr was 61 sec, which remains below the goal range for this pt. H/H 12.3/39.1, plt 370. Per RN, no issues with IV or bleeding observed.   Goal of Therapy:  aPTT 66-102 seconds Monitor platelets by anticoagulation protocol: Yes   Plan:  Increase heparin infusion to 1150 units/hr Check aPTT, heparin level in 8 hours Monitor daily CBC, heparin level/aPTT,  s/sx bleeding  Thank you for involving pharmacy in this patient's care.  Gillermina Hu, PharmD, BCPS, [facility] Clinical Pharmacist 01/16/2020 6:34 PM

## 2020-01-16 NOTE — Procedures (Addendum)
Patient Name: [name] [name]  MRN: [id number]  Epilepsy Attending: Lora Havens  Referring Physician/Provider: Dr Amie Portland Duration:  01/15/2020 1529 to 01/16/2020 1529  Patient history: 75 year old woman withbilateral frontal meningiomas worse on the left status post multiple craniotomies for recurrence, presenting for 7 to 10 days worth of altered mental status, difficulty with ambulation and confusion along with word finding difficulty. EEG to evaluate for seizure   Level of alertness: Awake, asleep  AEDs during EEG study: LEV  Technical aspects: This EEG study was done with scalp electrodes positioned according to the 10-20 International system of electrode placement. Electrical activity was acquired at a sampling rate of 500Hz  and reviewed with a high frequency filter of 70Hz  and a low frequency filter of 1Hz . EEG data were recorded continuously and digitally stored.   Description: The posterior dominant rhythm consists of 9-10 Hz activity of moderate voltage (25-35 uV) seen predominantly in posterior head regions, symmetric and reactive to eye opening and eye closing.   Sleep was characterized by vertex waves, sleep spindles (12 to 14 Hz), maximal frontocentral region. EEG showed 3 to 5 Hz theta-delta slowing in bifrontal region with sharply contoured morphology which appears rhythmic at times.sharp waves were also noted in bifrontal region. Hyperventilation and photic stimulation were not performed.   ABNORMALITY -Sharp waves, bifrontal, left>right -Continuous slow,bifrontal region - Breech artifact, bifrontal region  IMPRESSION: This studyshowed evidence of epileptogenicity as well as cortical dysfunction arising from bifrontal ( left>right) region likely secondary to underlying structural abnormality as well as craniotomy. The sharp waves at times appear rhythmic which is on the ictal- interictal continuum. No definite seizureswere seen throughout the  recording.  [name] [name]

## 2020-01-16 NOTE — Care Plan (Signed)
Situation: Called to bedside for worsening.  In particular, nurse Apolonio Schneiders felt she had right visual field cut and significantly worsened right hemiplegia.  Rapid response team was called simultaneously and then assessed the patient while I was reviewing records and EEG.  I arrived approximately 10 minutes after rapid response nurse assessment.    Keppra was increased today to 1250 twice daily from 750 twice daily and the patient has not yet received this increased dose  Exam: By the time of my arrival, patient's right upper extremity was at least 3/5 and right lower extremity was 2/5.  She was able to follow simple commands (such as sticking out her tongue, squeezing and letting go of my hand).  Vital signs were stable (blood pressure 120s over 70s, baseline typically has been 150/90 this admission) and she was in no acute distress.    Data reviewed: EEG showed continued left greater than right cortical dysfunction with some rhythmicity and ictal- interictal continuum not clearly seizure activity; defer detailed full review of LTM to Dr. Hortense Ramal tomorrow.  Assessment: Delirium versus focal seizure of the left cerebral cortex, given the patient's rapid recovery.  Considered bleed in the setting of heparin drip but this seems very unlikely given her exam is rapidly improving.  She does have a worsening leukocytosis, so I will repeat some infectious studies given infection can also lower the seizure threshold  Plan: -Ativan 2 mg one-time dose as needed for seizure lasting greater than 5 minutes; notify neurology if dose used -Keppra 1500 mg IV once, then 1250 mg BID oral tomorrow  -Will consider second agent if she has repeat concern for seizure overnight -Urine analysis, urine culture -chest x-ray -CMP tomorrow morning  No charge follow-up note

## 2020-01-16 NOTE — Progress Notes (Signed)
Initial Nutrition Assessment  DOCUMENTATION CODES:   Not applicable  INTERVENTION:    Ensure Enlive po BID, each supplement provides 350 kcal and 20 grams of protein  MVI daily   NUTRITION DIAGNOSIS:   Increased nutrient needs related to acute illness as evidenced by estimated needs.  GOAL:   Patient will meet greater than or equal to 90% of their needs  MONITOR:   PO intake,Supplement acceptance,Weight trends,Labs,I & O's  REASON FOR ASSESSMENT:   Consult Assessment of nutrition requirement/status  ASSESSMENT:   Patient with PMH significant for L frontal meningioma, cavernous sinus meningioma s/p resection, HLD, IBS, and HTN. Presents this admission with acute encephalopathy most likely 2/2 to progressive meningioma.   Obtained history from husband at bedside. Reports pt 2-3 days of poor appetite given increased confusion. During this time she mostly consumed soups. Prior to this she ate 2 meals daily that consisted of B-eggs, grits D- meat, vegetable, grain. Did not use supplementation. Intake shows to be improving with the help of husband feeding pt. They are amenable to trying Ensure to maximize kcal and protein this admission.  Husband unsure of pt's UBW and denies recent weight loss. Records indicate pt has maintained her weight over the last 5 months.   Medications: decadron  Labs: reviewed   NUTRITION - FOCUSED PHYSICAL EXAM:  Flowsheet Row Most Recent Value  Orbital Region No depletion  Upper Arm Region No depletion  Thoracic and Lumbar Region Unable to assess  Buccal Region No depletion  Temple Region No depletion  Clavicle Bone Region No depletion  Clavicle and Acromion Bone Region No depletion  Scapular Bone Region Unable to assess  Dorsal Hand No depletion  Patellar Region No depletion  Anterior Thigh Region No depletion  Posterior Calf Region No depletion  Edema (RD Assessment) None  Hair Reviewed  Eyes Reviewed  Mouth Reviewed  Skin Reviewed   Nails Reviewed     Diet Order:   Diet Order            Diet regular Room service appropriate? Yes; Fluid consistency: Thin  Diet effective now                 EDUCATION NEEDS:   Education needs have been addressed  Skin:  Skin Assessment: Reviewed RN Assessment  Last BM:  PTA  Height:   Ht Readings from Last 1 Encounters:  01/16/20 5\' 2"  (1.575 m)    Weight:   Wt Readings from Last 1 Encounters:  01/16/20 76 kg    BMI:  Body mass index is 30.65 kg/m.  Estimated Nutritional Needs:   Kcal:  1800-2000 kcal  Protein:  90-105 grams  Fluid:  >/= 1.8 L/day  Mariana Single RD, LDN Clinical Nutrition Pager listed in [place]

## 2020-01-16 NOTE — Progress Notes (Addendum)
Neurology Progress Note   S:// Patient is seen and examined today. Patient cannot tell her name, age, and can repeat sentences and name simple objects.   O://Patient is doing little better.  Still slow in responding question.  Still has right UE and LE weakness.  BP has been running high.  Last BP 165/93 mmHg, WBCs 16.1 today increased from 11.6.  She has been afebrile. Patient is on Decadron 4 mg IV twice daily and Keppra 750 mg twice daily. LTM started yesterday. Current vital signs: BP (!) 165/93 (BP Location: Right Arm) Comment: nurse not.  Pulse 79   Temp 98.4 F (36.9 C) (Oral)   Resp 20   Ht 5\' 2"  (1.575 m)   Wt 76 kg   SpO2 98%   BMI 30.65 kg/m  Vital signs in last 24 hours: Temp:  [98.3 F (36.8 C)-98.4 F (36.9 C)] 98.4 F (36.9 C) (01/12 0030) Pulse Rate:  [79-101] 79 (01/12 0030) Resp:  [10-22] 20 (01/12 0030) BP: (116-173)/(72-101) 165/93 (01/12 0030) SpO2:  [93 %-98 %] 98 % (01/12 0030) Weight:  [76 kg] 76 kg (01/12 0030) GENERAL: Awake, alert in NAD HEENT: - Normocephalic and atraumatic LUNGS -symmetrical chest rise, normal respiratory effort  CV -no JVD, no peripheral edema. ABDOMEN - Soft,nondistended Ext: warm, well perfused, ,no peripheral edema NEURO:  Mental Status: Alert, oriented to self, able to tell her name, age.  Not able to tell where she is, date, month, year. Language: speech is mildly dysarthric. slow in responding questions,  able to name some objects but blanks out on others, normal repetition,and comprehension intact. Cranial Nerves: PERRL,  Not able to track finger, visual fields full on right side but does not blink on threat on left side, no facial asymmetry with no weakness, able to smile, raise eyebrows, stick tongue out.  Not able to tell facial sensations, hearing intact, tongue/uvula/soft palate midline, normal sternocleidomastoid and trapezius muscle strength. No evidence of tongue atrophy or fibrillations. Motor: Patient is able to  lift both hands against gravity with mild drift on right side.  Mild right upper extremity weakness 4+/5 in right U ED and 5/5 in LUE, able to lift both legs against gravity but can lift left leg more than the right.  Right LE 4+/5 and left LE 5/5 Tone: is normal and bulk is normal Sensation- Intact to light touch bilaterally  Coordination: Not able to assess as patient does not follow command.  Gait- deferred   Medications  Current Facility-Administered Medications:  .  acetaminophen (TYLENOL) tablet 650 mg, 650 mg, Oral, Q6H PRN **OR** acetaminophen (TYLENOL) suppository 650 mg, 650 mg, Rectal, Q6H PRN, Doutova, Anastassia, MD .  dexamethasone (DECADRON) injection 4 mg, 4 mg, Intravenous, Q12H, Doutova, Anastassia, MD, 4 mg at 01/15/20 2157 .  ezetimibe (ZETIA) tablet 10 mg, 10 mg, Oral, Daily, Doutova, Anastassia, MD, 10 mg at 01/15/20 1053 .  heparin ADULT infusion 100 units/mL (25000 units/243mL), 1,000 Units/hr, Intravenous, Continuous, von Dohlen, Haley B, RPH, Last Rate: 10 mL/hr at 01/15/20 2133, 1,000 Units/hr at 01/15/20 2133 .  levETIRAcetam (KEPPRA) tablet 750 mg, 750 mg, Oral, BID, Doutova, Anastassia, MD, 750 mg at 01/15/20 2157 .  ondansetron (ZOFRAN) tablet 4 mg, 4 mg, Oral, Q6H PRN **OR** ondansetron (ZOFRAN) injection 4 mg, 4 mg, Intravenous, Q6H PRN, Doutova, Anastassia, MD Labs CBC    Component Value Date/Time   WBC 16.1 (H) 01/16/2020 0222   RBC 4.00 01/16/2020 0222   HGB 12.3 01/16/2020 0222   HCT  39.1 01/16/2020 0222   PLT 370 01/16/2020 0222   MCV 97.8 01/16/2020 0222   MCH 30.8 01/16/2020 0222   MCHC 31.5 01/16/2020 0222   RDW 12.4 01/16/2020 0222   LYMPHSABS 0.7 01/15/2020 0308   MONOABS 0.1 01/15/2020 0308   EOSABS 0.0 01/15/2020 0308   BASOSABS 0.1 01/15/2020 0308    CMP     Component Value Date/Time   NA 144 01/15/2020 0308   NA 141 07/15/2015 0000   NA 141 07/15/2015 0000   K 3.6 01/15/2020 0308   CL 106 01/15/2020 0308   CO2 21 (L) 01/15/2020  0308   GLUCOSE 126 (H) 01/15/2020 0308   BUN 16 01/15/2020 0308   BUN 22 (A) 07/15/2015 0000   BUN 22 (A) 07/15/2015 0000   CREATININE 0.80 01/15/2020 0308   CREATININE 0.84 12/26/2018 1217   CALCIUM 9.2 01/15/2020 0308   PROT 6.6 01/15/2020 0308   ALBUMIN 2.9 (L) 01/15/2020 0308   AST 18 01/15/2020 0308   ALT 17 01/15/2020 0308   ALKPHOS 62 01/15/2020 0308   BILITOT 1.1 01/15/2020 0308   GFRNONAA >60 01/15/2020 0308   GFRNONAA >60 12/26/2018 1217   GFRAA >60 12/26/2018 1217    glycosylated hemoglobin  Lipid Panel     Component Value Date/Time   CHOL 261 (H) 10/23/2019 0318   TRIG 115 10/23/2019 0318   HDL 56 10/23/2019 0318   CHOLHDL 4.7 10/23/2019 0318   VLDL 23 10/23/2019 0318   LDLCALC 182 (H) 10/23/2019 0318   LDLDIRECT 157.0 03/15/2019 1338     Imaging I have reviewed images in epic and the results pertinent to this consultation are:  CT-scan of the brain Postsurgical changes from bilateral frontal craniotomy for extra-axial tumor resection. Persistent large surgical cavity with thick nodular margins with the fluid portion measuring up to 5.0 cm, similar to prior MRI. Stable appearance of prominent edema within the white matter of the bilateral frontal lobes, including corpus callosum. 2. Unchanged appearance of extra-axial lesion centered in the left sphenoid wing/cavernous sinus.  MRI examination of the brain 1. Postop resection of bifrontal meningioma. Large surgical cavity is present in the left frontal lobe. There is extensive nodular enhancement around the wall of the cavity compatible with the current meningioma. This has grown rapidly since the prior MRI 11/10/2019. Mild progression of bifrontal white matter edema. 2. Bulky left cavernous carotid meningioma stable.  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  MRI revealed recurrence of left frontal meningioma and bifrontal edema which has grown rapidly since the prior MRI 11/10/2019.  Her symptoms are likely due to worsening of left frontal meningioma. Patient is on Decadron 4 mg IV twice daily and Keppra 750 mg twice daily.  EEG on 1/11 showed evidence of epileptogenicity as well as cortical dysfunction arising from bifrontal (left > right) region likely secondary to underlying structural abnormality as well as craniotomy.  No seizures were seen throughout the recording.  Patient was put on LTM EEG on 1/11.  Dr. Mickeal Skinner saw her on 1/11 and thinks that the decline feels out of proportion to the changes seen on MRI scan.  He would plan for salvage radiation and systemic agent like Bevacizumab if she makes improvement in functional status.   Impression: Meningioma Likely recurrence of left frontal meningioma Underlying seizure disorder  Recommendations:  -Increase Keppra to  1250 mg twice daily -Continue Decadron 4 mg IV twice daily -Continue telemetry -Continue LTM -Seizure precautions.  Per [facility] [employer] statutes, patients with seizures are not allowed to drive until they have been seizure-free for six months.   Use caution when using heavy equipment or power tools. Avoid working on ladders or at heights. Take showers instead of baths. Ensure the water temperature is not too high on the home water heater. Do not go swimming alone. Do not lock yourself in a room alone (i.e. bathroom). When caring for infants or small children, sit down when holding, feeding, or changing them to minimize risk of injury to the child in the event you have a seizure. Maintain good sleep hygiene. Avoid alcohol.   If patient has another seizure, call 911 and bring them back to the ED if: A. The seizure lasts longer than 5 minutes.  B. The patient doesn't wake shortly after the seizure or has new problems such as difficulty seeing,  speaking or moving following the seizure C. The patient was injured during the seizure D. The patient has a temperature over 102 F (39C) E. The patient vomited during the seizure and now is having trouble breathing   Dr Armando Reichert MD PGY1 [facility] Neurology   Attending addendum Agree with history and physical above Appreciate Dr. Renda Rolls input Her overnight LTM EEG showed evidence of epileptogenicity as well as cortical dysfunction bifrontal left greater than right likely secondary to the underlying structural abnormality as well as craniotomy.  This sharp waves at times appeared rhythmic which is on the ictal interictal continuum. No definitive seizures seen through the recording Because of the above EEG findings, we will continue LTM EEG for one more day, will increase her Keppra as above and continue steroids. If in spite of the increased antiepileptic, she remains unchanged on exam, will have to consider goals of care conversations because the only palliative options from a interventional standpoint would be as outlined by Dr. Wenda Overland radiation, and potentially systemic bevacizumab, if she were to make improvement in her functional status. Plan communicated to the primary hospitalist -- Amie Portland, MD Neurologist [facility] Pager: [phone]

## 2020-01-16 NOTE — Progress Notes (Addendum)
Upon assessment, patient not responding to verbal questions. Unable to state name. Patient following simple commands. Patient has unilateral weakness, greater on right side. Patient unable to hold right arm raise against gravity and immediately falls. Hand grasp weaker on right side than left. Right pedal push/pull weaker on right side. Patient has facial asymmetry with noted right side droop with smile. Patient not able to track finger and does not blink on threat to right side. This is a change from Dr. Rory Percy (Neurology) assessment earlier.   1940: Rapid response nurse called for further evaluation.   1950: Dr. Curly Shores paged.   1951: Rapid response nurse Shanon Brow at bedside. Vitals stable (see flowsheet).  2001: Dr. Curly Shores at bedside. Provider to review EEG. Will give increased dose of Keppra IV at this time. With increase in leukocytosis, provider ordered urinalysis, urine and blood cultures. Portable chest xray ordered as well. Will notify provider if right side weakness increases or for any further changes in neuro status.

## 2020-01-16 NOTE — Progress Notes (Signed)
LTM maint complete - no skin breakdown under: FP1, FP2. Computer was not online pt go moved to the floor. Now connected back to server

## 2020-01-16 NOTE — Progress Notes (Signed)
PROGRESS NOTE    [name] [name]  BOF:[id number] DOB: [birth year] DOA: 01/14/2020 PCP: Darreld Mclean, MD   Brief Narrative:  [name] [name] a 75 y.o.femalewith medical history significant of left frontal meningioma and cavernous sinus meningioma status post resection, off of steroids, on Keppra for concern for seizures, HLD, IBS, HTN osteoporosis Patient is on EliquisPE in November  Presented withprogressive confusion and weakness and difficulty walking for the past 10 days.Prior to this she was performing her ADLs. Family denies slurred speech, but she has trouble finding words for sentences. The patient had a MRI done 3 days ago that showed recurrence of left frontal meningioma at the resection site.   Pt unable to provide her own hx. She is undergoing EEG as I enter her bay. It has since been read and it demonstrated evidence of epileptogenicity as well as cortical dysfunction arising from bifrontal lobes (left >right). No seizures were seen during EEG or during hospital stay.  Neurology has recommended continuation of keppra 750 mg bid, decadron 4 mg IV bid, LTM EEG, telemetry monitoring, and seizure precautions.  Dr. Rory Percy also spoke with Dr. Mickeal Skinner, the patient's neurosurgeon. Dr. Mickeal Skinner apparently does not feel that the progression of the meningioma alone can explain the patient's symptoms. Concern is for intermittent seizures. He will see that patient himself.   [facility] were consulted to admit the patient for further evaluation and treatment. Neurology was consulted.   Assessment & Plan:   Active Problems:   Hyperlipidemia   Meningioma (HCC)   Acute metabolic encephalopathy   Acute encephalopathy   Epilepsia ([place])   SIRS (systemic inflammatory response syndrome) (HCC)   History of pulmonary embolus (PE)   Chronic diastolic CHF (congestive heart failure) (HCC)   Acute encephalopathy/recurrent meningioma/?  Seizures: Mainly secondary to  recurrence of meningioma and seizure activity.  Neurology neurooncology on board.  Patient is undergoing LTM.  She remains on Keppra as well as dexamethasone.  Further plan of care by neuro-oncology and neurology.  Hyperlipidemia: Continue Zetia.  Failure to thrive: Reported poor PO intake in recent weeks.  Will consult nutrition.  SIRS: On admission due to elevated WBC and fever. However, this is resolving with hydration. Believe it is likely due to hemoconcentration alone. Fever of 100.4 may have been due to seizure activity. No source of infection has been identified.   History of pulmonary embolus: The patient is on a heparin drip due to perceived risk of bleeding due to hemangioma. Dr. Rory Percy feels that this is unlikely.   Chronic diastolic CHF: Appears euvolemic.  Elevated blood pressure: No history of hypertension in the past however since last 24 hours, patient's blood pressure has remained elevated.  Will start on IV as needed hydralazine for now.  DVT prophylaxis: SCDs Start: 01/14/20 2234   Code Status: Full Code  Family Communication:  None present at bedside.    Status is: Inpatient  Remains inpatient appropriate because:Inpatient level of care appropriate due to severity of illness   Dispo: The patient is from: Home              Anticipated d/c is to: Home              Anticipated d/c date is: 2 days              Patient currently is not medically stable to d/c.        Estimated body mass index is 30.65 kg/m as calculated from the following:   Height as  of this encounter: 5\' 2"  (1.575 m).   Weight as of this encounter: 76 kg.      Nutritional status:               Consultants:   Neuro-oncology  Neurology  Procedures:   None  Antimicrobials:  Anti-infectives (From admission, onward)   None         Subjective: Seen and examined.  Looks comfortable and alert however keeps repeating herself and very slow in response.  Seems to be  confused but pleasant.  No complaints.  Objective: Vitals:   01/15/20 1900 01/15/20 2200 01/15/20 2300 01/16/20 0030  BP: 134/77 (!) 160/88 (!) 150/94 (!) 165/93  Pulse: 98 91 86 79  Resp: 18 (!) 22 16 20   Temp:    98.4 F (36.9 C)  TempSrc:    Oral  SpO2: 93% 96% 94% 98%  Weight:    76 kg  Height:    5\' 2"  (1.575 m)   No intake or output data in the 24 hours ending 01/16/20 1041 Filed Weights   01/14/20 2200 01/16/20 0030  Weight: 76 kg 76 kg    Examination:  General exam: Appears calm and comfortable  Respiratory system: Clear to auscultation. Respiratory effort normal. Cardiovascular system: S1 & S2 heard, RRR. No JVD, murmurs, rubs, gallops or clicks. No pedal edema. Gastrointestinal system: Abdomen is nondistended, soft and nontender. No organomegaly or masses felt. Normal bowel sounds heard. Central nervous system: Alert and oriented x1. No focal neurological deficits. Extremities: Symmetric 5 x 5 power. Skin: No rashes, lesions or ulcers  Data Reviewed: I have personally reviewed following labs and imaging studies  CBC: Recent Labs  Lab 01/14/20 1735 01/14/20 2218 01/15/20 0308 01/16/20 0222  WBC 11.9*  --  11.6* 16.1*  NEUTROABS 8.9*  --  10.7*  --   HGB 13.6 10.5* 13.1 12.3  HCT 43.9 31.0* 40.8 39.1  MCV 100.0  --  100.5* 97.8  PLT 403*  --  379 258   Basic Metabolic Panel: Recent Labs  Lab 01/14/20 1735 01/14/20 2218 01/15/20 0308  NA 144 147* 144  K 4.1 3.1* 3.6  CL 103  --  106  CO2 26  --  21*  GLUCOSE 89  --  126*  BUN 15  --  16  CREATININE 0.85  --  0.80  CALCIUM 9.4  --  9.2  MG 2.3  --  2.2  PHOS 3.9  --  4.4   GFR: Estimated Creatinine Clearance: 60.7 mL/min (by C-G formula based on SCr of 0.8 mg/dL). Liver Function Tests: Recent Labs  Lab 01/14/20 1735 01/15/20 0308  AST 30 18  ALT 18 17  ALKPHOS 66 62  BILITOT 1.0 1.1  PROT 6.9 6.6  ALBUMIN 3.1* 2.9*   No results for input(s): LIPASE, AMYLASE in the last 168  hours. Recent Labs  Lab 01/14/20 1758  AMMONIA 51*   Coagulation Profile: Recent Labs  Lab 01/14/20 1735  INR 1.5*   Cardiac Enzymes: Recent Labs  Lab 01/14/20 1735  CKTOTAL 72   BNP (last 3 results) Recent Labs    11/19/19 1512  PROBNP 96.0   HbA1C: No results for input(s): HGBA1C in the last 72 hours. CBG: No results for input(s): GLUCAP in the last 168 hours. Lipid Profile: No results for input(s): CHOL, HDL, LDLCALC, TRIG, CHOLHDL, LDLDIRECT in the last 72 hours. Thyroid Function Tests: Recent Labs    01/15/20 0308  TSH 0.629   Anemia Panel:  No results for input(s): VITAMINB12, FOLATE, FERRITIN, TIBC, IRON, RETICCTPCT in the last 72 hours. Sepsis Labs: Recent Labs  Lab 01/14/20 1735 01/14/20 1758  PROCALCITON <0.10  --   LATICACIDVEN  --  1.1    Recent Results (from the past 240 hour(s))  Blood culture (routine x 2)     Status: None (Preliminary result)   Collection Time: 01/14/20  6:29 PM   Specimen: BLOOD  Result Value Ref Range Status   Specimen Description BLOOD SITE NOT SPECIFIED  Final   Special Requests   Final    BOTTLES DRAWN AEROBIC AND ANAEROBIC Blood Culture adequate volume   Culture   Final    NO GROWTH 2 DAYS Performed at [facility], [address], [place], [place] [address]    Report Status PENDING  Incomplete  Resp Panel by RT-PCR (Flu A&B, Covid) Nasopharyngeal Swab     Status: None   Collection Time: 01/14/20  8:11 PM   Specimen: Nasopharyngeal Swab; Nasopharyngeal(NP) swabs in vial transport medium  Result Value Ref Range Status   SARS Coronavirus 2 by RT PCR NEGATIVE NEGATIVE Final    Comment: (NOTE) SARS-CoV-2 target nucleic acids are NOT DETECTED.  The SARS-CoV-2 RNA is generally detectable in upper respiratory specimens during the acute phase of infection. The lowest concentration of SARS-CoV-2 viral copies this assay can detect is 138 copies/mL. A negative result does not preclude SARS-Cov-2 infection and  should not be used as the sole basis for treatment or other patient management decisions. A negative result may occur with  improper specimen collection/handling, submission of specimen other than nasopharyngeal swab, presence of viral mutation(s) within the areas targeted by this assay, and inadequate number of viral copies(<138 copies/mL). A negative result must be combined with clinical observations, patient history, and epidemiological information. The expected result is Negative.  Fact Sheet for Patients:  EntrepreneurPulse.com.au  Fact Sheet for Healthcare Providers:  IncredibleEmployment.be  This test is no t yet approved or cleared by the [place] FDA and  has been authorized for detection and/or diagnosis of SARS-CoV-2 by FDA under an Emergency Use Authorization (EUA). This EUA will remain  in effect (meaning this test can be used) for the duration of the COVID-19 declaration under Section 564(b)(1) of the Act, 21 U.S.C.section 360bbb-3(b)(1), unless the authorization is terminated  or revoked sooner.       Influenza A by PCR NEGATIVE NEGATIVE Final   Influenza B by PCR NEGATIVE NEGATIVE Final    Comment: (NOTE) The Xpert Xpress SARS-CoV-2/FLU/RSV plus assay is intended as an aid in the diagnosis of influenza from Nasopharyngeal swab specimens and should not be used as a sole basis for treatment. Nasal washings and aspirates are unacceptable for Xpert Xpress SARS-CoV-2/FLU/RSV testing.  Fact Sheet for Patients: EntrepreneurPulse.com.au  Fact Sheet for Healthcare Providers: IncredibleEmployment.be  This test is not yet approved or cleared by the [place] FDA and has been authorized for detection and/or diagnosis of SARS-CoV-2 by FDA under an Emergency Use Authorization (EUA). This EUA will remain in effect (meaning this test can be used) for the duration of the COVID-19 declaration  under Section 564(b)(1) of the Act, 21 U.S.C. section 360bbb-3(b)(1), unless the authorization is terminated or revoked.  Performed at [facility], [place] [address] [place], [place] [address]       Radiology Studies: CT HEAD WO CONTRAST  Result Date: 01/14/2020 CLINICAL DATA:  Delirium. EXAM: CT HEAD WITHOUT CONTRAST TECHNIQUE: Contiguous axial images were obtained from  the base of the skull through the vertex without intravenous contrast. COMPARISON:  MRI of the brain January 11, 2020. FINDINGS: Brain: Postsurgical changes from bifrontal craniotomy for left frontal extra-axial tumor resection. Persistent large surgical cavity with thick nodular margins with the fluid portion measuring approximately 5.0 x 4.6 x 2.7 cm, similar to prior MRI. Mass effect with mild rightward deviation of the anterior aspect of the cerebral falx (5 mm). Stable appearance of prominent edema within the white matter of the bilateral frontal lobes, including corpus callosum. Unchanged appearance of extra-axial lesion centered in the left sphenoid wing/cavernous sinus. No hydrocephalus or intracranial hemorrhage. Posterior fossa structures appear preserved. Vascular: No hyperdense vessel sign. Skull: Postsurgical changes from bilateral frontal craniotomy. Sinuses/Orbits: Mucosal thickening of the left sphenoid sinus. The orbits are maintained. IMPRESSION: 1. Postsurgical changes from bilateral frontal craniotomy for extra-axial tumor resection. Persistent large surgical cavity with thick nodular margins with the fluid portion measuring up to 5.0 cm, similar to prior MRI. Stable appearance of prominent edema within the white matter of the bilateral frontal lobes, including corpus callosum. 2. Unchanged appearance of extra-axial lesion centered in the left sphenoid wing/cavernous sinus. Electronically Signed   By: Pedro Earls M.D.   On: 01/14/2020 19:01   DG Chest Port 1 View  Result Date:  01/14/2020 CLINICAL DATA:  Sepsis EXAM: PORTABLE CHEST 1 VIEW COMPARISON:  12/24/2019 FINDINGS: Heart and mediastinal contours are within normal limits. No focal opacities or effusions. No acute bony abnormality. IMPRESSION: No active disease. Electronically Signed   By: Rolm Baptise M.D.   On: 01/14/2020 20:43   EEG adult  Result Date: 01/15/2020 Lora Havens, MD     01/15/2020 10:27 AM Patient Name: Sallye Henner MRN: [id number] Epilepsy Attending: Lora Havens Referring Physician/Provider: Dr Leonie Man Date: 01/15/2020 Duration: 25.56 mins Patient history: 75 year old woman with bilateral frontal meningiomas worse on the left status post multiple craniotomies for recurrence, presenting for 7 to 10 days worth of altered mental status, difficulty with ambulation and confusion along with word finding difficulty. EEG to evaluate for seizure  Level of alertness: Awake AEDs during EEG study: LEV Technical aspects: This EEG study was done with scalp electrodes positioned according to the 10-20 International system of electrode placement. Electrical activity was acquired at a sampling rate of 500Hz  and reviewed with a high frequency filter of 70Hz  and a low frequency filter of 1Hz . EEG data were recorded continuously and digitally stored. Description: The posterior dominant rhythm consists of 9-10 Hz activity of moderate voltage (25-35 uV) seen predominantly in posterior head regions, symmetric and reactive to eye opening and eye closing. EEG showed 3 to 5 Hz theta-delta slowing in bifrontal region with sharply contoured morphology.  Sharp waves were also noted in bifrontal region.  Hyperventilation and photic stimulation were not performed.    ABNORMALITY -Sharp waves, bifrontal, left>right -Continuous slow, bifrontal region - Breech artifact, bifrontal region  IMPRESSION: This study showed evidence of epileptogenicity as well as cortical dysfunction arising from bifrontal ( left>right) region  likely secondary to underlying structural abnormality as well as craniotomy.  No seizures were seen throughout the recording.  Priyanka [name]   Overnight EEG with video  Result Date: 01/16/2020 Lora Havens, MD     01/16/2020 10:30 AM Patient Name: Cheila Glacken MRN: [id number] Epilepsy Attending: Lora Havens Referring Physician/Provider: Dr Amie [place] Duration:  01/15/2020 1529 to 01/16/2020 1015  Patient history: 75 year old woman withbilateral frontal meningiomas worse on the left  status post multiple craniotomies for recurrence, presenting for 7 to 10 days worth of altered mental status, difficulty with ambulation and confusion along with word finding difficulty. EEG to evaluate for seizure  Level of alertness: Awake, asleep  AEDs during EEG study: LEV  Technical aspects: This EEG study was done with scalp electrodes positioned according to the 10-20 International system of electrode placement. Electrical activity was acquired at a sampling rate of 500Hz  and reviewed with a high frequency filter of 70Hz  and a low frequency filter of 1Hz . EEG data were recorded continuously and digitally stored.  Description: The posterior dominant rhythm consists of 9-10 Hz activity of moderate voltage (25-35 uV) seen predominantly in posterior head regions, symmetric and reactive to eye opening and eye closing.   Sleep was characterized by vertex waves, sleep spindles (12 to 14 Hz), maximal frontocentral region. EEG showed 3 to 5 Hz theta-delta slowing in bifrontal region with sharply contoured morphology which appears rhythmic at times.sharp waves were also noted in bifrontal region. Hyperventilation and photic stimulation were not performed.   ABNORMALITY -Sharp waves, bifrontal, left>right -Continuous slow,bifrontal region - Breech artifact, bifrontal region  IMPRESSION: This studyshowed evidence of epileptogenicity as well as cortical dysfunction arising from bifrontal ( left>right) region  likely secondary to underlying structural abnormality as well as craniotomy. The sharp waves at times appear rhythmic which is on the ictal- interictal continuum. No definite seizureswere seen throughout the recording.  Priyanka [name]   Scheduled Meds: . dexamethasone (DECADRON) injection  4 mg Intravenous Q12H  . ezetimibe  10 mg Oral Daily  . levETIRAcetam  750 mg Oral BID   Continuous Infusions: . heparin 1,000 Units/hr (01/15/20 2133)     LOS: 2 days   Time spent: 35 minutes   Darliss Cheney, MD [facility]  01/16/2020, 10:41 AM   To contact the attending provider between 7A-7P or the covering provider during after hours 7P-7A, please log into the web site [email or web address].

## 2020-01-16 NOTE — Evaluation (Signed)
Occupational Therapy Evaluation Patient Details Name: [name] [name] MRN: [id number] DOB: 04/14/1945 Today's Date: 01/16/2020    History of Present Illness 75 yo female with onset of AMS and suspected seizures was brought to ED, has noted acute encephalopathy and history of recurrent frontal B meningiomas with craniotomies for returns.  PMHx:  meningiomas frontal lobes, skin CA, radiation of brain, HTN, HLD, PONV, IBS, sob, osteoporosis, steroid use,   Clinical Impression   Pt presents with sharp decline in functional and cognitive status per fiance who was present and engaged during session. Pt seen in conjunction with PT to maximize therapeutic benefit. Evaluation focused on EOB ADLs due to pt hooked up to EEG. Pt overall Max A x2 for bed mobility and sit to stand transfer at bedside. Pt overall Max A for self feeding and grooming tasks, Max A for UB ADLs and Total A for LB ADLs at this time. Pt with slow processing, impaired sequencing and decreased attention span during tasks. Recommend CIR consult for comprehensive therapies to maximize independence/safety in ADLs. Plan to progress OOB and further address sequencing/attention during simple ADLs.     Follow Up Recommendations  CIR;Supervision/Assistance - 24 hour    Equipment Recommendations  Wheelchair (measurements OT);Wheelchair cushion (measurements OT);Hospital bed;3 in 1 bedside commode    Recommendations for Other Services Rehab consult     Precautions / Restrictions Precautions Precautions: Fall Precaution Comments: seizure precautions Restrictions Weight Bearing Restrictions: No      Mobility Bed Mobility Overal bed mobility: Needs Assistance Bed Mobility: Supine to Sit;Sit to Supine Rolling: +2 for physical assistance;Max assist Sidelying to sit: Max assist;+2 for physical assistance Supine to sit: Max assist;+2 for physical assistance;+2 for safety/equipment;HOB elevated Sit to supine: Total assist;+2 for physical  assistance;+2 for safety/equipment   General bed mobility comments: Max A x 2 to advance EOB, assist for B LE and handheld assist for trunk, Total A x 2 to return to supine and scoot up in bed.    Transfers Overall transfer level: Needs assistance Equipment used: 2 person hand held assist Transfers: Sit to/from Stand Sit to Stand: +2 physical assistance;+2 safety/equipment;Max assist         General transfer comment: Max A x 2  for sit to stand with pt initiating lifting hips from bed. Guided pt in side steps at bedside with Max A x 2, difficulty sequencing and advancing feet and reliant on external support to maintain balance    Balance Overall balance assessment: Needs assistance Sitting-balance support: Feet supported;Bilateral upper extremity supported Sitting balance-Leahy Scale: Poor Sitting balance - Comments: Grossly Min A to maintain sitting balance EOB with noted posterior lean when distracted or fatigued Postural control: Posterior lean   Standing balance-Leahy Scale: Zero Standing balance comment: dependent on physical assist                           ADL either performed or assessed with clinical judgement   ADL Overall ADL's : Needs assistance/impaired Eating/Feeding: Maximal assistance;Sitting;Bed level Eating/Feeding Details (indicate cue type and reason): Max A overall for self feeding, progressing to Min A at times but requiring increased assist with waning attention/endurance. Assistance needed under elbow of R dominant UE (though weaker UE) to bring to mouth, assistance to scoop food. Pt able to hold cookie and bring to mouth with L hand. Max A to drink from cup due to difficulty holding cup securely Grooming: Maximal assistance;Sitting;Wash/dry face Grooming Details (indicate cue type and  reason): Max A to wash face, manual assist needed under elbow to bring to face. only washed nose briefly, assistance for thoroughness                                General ADL Comments: Largest impaiments in function attributed to change in cognitive status, impacting processing, problem solving     Vision Patient Visual Report: No change from baseline Vision Assessment?: No apparent visual deficits     Perception     Praxis      Pertinent Vitals/Pain Pain Assessment: Faces Faces Pain Scale: No hurt Pain Intervention(s): Monitored during session     Hand Dominance Right   Extremity/Trunk Assessment Upper Extremity Assessment Upper Extremity Assessment: RUE deficits/detail;LUE deficits/detail RUE Deficits / Details: hx of R sided weakness, more prominent in shoulder, biceps/triceps area RUE Coordination: decreased fine motor;decreased gross motor LUE Deficits / Details: Improved ability to reach to tray table but limited by weakness, incoordination LUE Coordination: decreased fine motor;decreased gross motor   Lower Extremity Assessment Lower Extremity Assessment: Defer to PT evaluation   Cervical / Trunk Assessment Cervical / Trunk Assessment: Normal   Communication Communication Communication: No difficulties   Cognition Arousal/Alertness: Awake/alert Behavior During Therapy: Flat affect Overall Cognitive Status: Impaired/Different from baseline Area of Impairment: Orientation;Attention;Memory;Following commands;Safety/judgement;Awareness;Problem solving                 Orientation Level: Time;Situation Current Attention Level: Sustained Memory: Decreased recall of precautions;Decreased short-term memory Following Commands: Follows one step commands inconsistently;Follows one step commands with increased time Safety/Judgement: Decreased awareness of safety;Decreased awareness of deficits Awareness: Intellectual Problem Solving: Slow processing;Requires tactile cues;Decreased initiation;Difficulty sequencing;Requires verbal cues General Comments: Pt with inconsistent answering to questions but with  improvements with repetition and allowing increased time to respond. Difficulty sequencing one-two step tasks. Demo appropriate behavioral responses to jokes from fiance in room.   General Comments  Pt with EEG still attached, received permission to sit EOB with pt during session but avoid transferring to chair until EEG complete. Fiance present during session and helping throughout    Exercises     Shoulder Instructions      Home Living Family/patient expects to be discharged to:: Private residence Living Arrangements: Spouse/significant other Available Help at Discharge: Family;Available 24 hours/day Type of Home: House Home Access: Stairs to enter     Home Layout: Two level     Bathroom Shower/Tub: Occupational psychologist: Standard Bathroom Accessibility: Yes   Home Equipment: Environmental consultant - 2 wheels   Additional Comments: Pt and fiance live in separate houses but if pt is to DC home and continues requiring assist, plan is to DC to fiance's home      Prior Functioning/Environment Level of Independence: Needs assistance  Gait / Transfers Assistance Needed: walked without walker but recently has to use walker, cannot climb stairs last 4 days ADL's / Homemaking Assistance Needed: has significant other to assist all care with sharp decline in week prior to admission. Pt typically able to feed self, requires assist for bathing, dressing   Comments: increase in falls, noted to fidget with items        OT Problem List: Decreased strength;Decreased activity tolerance;Impaired balance (sitting and/or standing);Decreased coordination;Decreased cognition;Decreased safety awareness;Decreased knowledge of use of DME or AE;Impaired UE functional use      OT Treatment/Interventions: Self-care/ADL training;Therapeutic exercise;DME and/or AE instruction;Therapeutic activities;Patient/family education;Balance training    OT  Goals(Current goals can be found in the care plan section)  Acute Rehab OT Goals Patient Stated Goal: pt reports she would like to be able to eat more spaghetti. fiance would like to increase pt independence to be able to go home OT Goal Formulation: With patient/family Time For Goal Achievement: 01/30/20 Potential to Achieve Goals: Good ADL Goals Pt Will Perform Eating: with supervision;sitting Pt Will Perform Grooming: with supervision;sitting Pt Will Perform Upper Body Bathing: with min assist;sitting Pt Will Transfer to Toilet: with min assist;stand pivot transfer;bedside commode Additional ADL Goal #1: Pt to demonstrate ability to follow one step commands >75% of the time to increase ADL participation Additional ADL Goal #2: Pt to demonstrate ability to sustain attention to functional task > 2 minutes Additional ADL Goal #3: Pt to demonstrate ability to sit EOB > 5 minutes with no more than supervision needed for maintaining balance during ADLs  OT Frequency: Min 2X/week   Barriers to D/C:            Co-evaluation PT/OT/SLP Co-Evaluation/Treatment: Yes Reason for Co-Treatment: Complexity of the patient's impairments (multi-system involvement);To address functional/ADL transfers;For patient/therapist safety PT goals addressed during session: Mobility/safety with mobility OT goals addressed during session: ADL's and self-care;Strengthening/ROM      AM-PAC OT "6 Clicks" Daily Activity     Outcome Measure Help from another person eating meals?: A Lot Help from another person taking care of personal grooming?: A Lot Help from another person toileting, which includes using toliet, bedpan, or urinal?: Total Help from another person bathing (including washing, rinsing, drying)?: Total Help from another person to put on and taking off regular upper body clothing?: A Lot Help from another person to put on and taking off regular lower body clothing?: Total 6 Click Score: 9   End of Session Equipment Utilized During Treatment: Gait belt Nurse  Communication: Mobility status  Activity Tolerance: Treatment limited secondary to medical complications (Comment);Patient limited by fatigue (limited by EEG attachments) Patient left: in bed;with call bell/phone within reach;with bed alarm set;with family/visitor present  OT Visit Diagnosis: Unsteadiness on feet (R26.81);Other abnormalities of gait and mobility (R26.89);Muscle weakness (generalized) (M62.81);Apraxia (R48.2);Ataxia, unspecified (R27.0);Other symptoms and signs involving cognitive function                Time: 1478-2956 OT Time Calculation (min): 33 min Charges:  OT General Charges $OT Visit: 1 Visit OT Evaluation $OT Eval Moderate Complexity: 1 Mod  Layla Maw, OTR/L  Layla Maw 01/16/2020, 2:01 PM

## 2020-01-16 NOTE — Progress Notes (Signed)
Physical Therapy Treatment Patient Details Name: [name] [name] MRN: [id number] DOB: 10-Nov-1945 Today's Date: 01/16/2020    History of Present Illness 75 yo female with onset of AMS and suspected seizures was brought to ED, has noted acute encephalopathy and history of recurrent frontal B meningiomas with craniotomies for returns.  PMHx:  meningiomas frontal lobes, skin CA, radiation of brain, HTN, HLD, PONV, IBS, sob, osteoporosis, steroid use,    PT Comments    Pt seen with OT today with focus on EOB balance as OOB limited by pt hooked up to EEG. Pt presenting with very delayed processing, impaired sequencing and initiation of task, minimal verbal response, generalized weakness R >L, and confusion. Pt currently requiring mod//maxAx2 for all mobility and requiring modA to maintain EOB balance while working with OT on self feeding. Continue to recommend CIR upon d/c. Acute PT to cont to follow.    Follow Up Recommendations  CIR     Equipment Recommendations   (TBD at next venue)    Recommendations for Other Services Rehab consult     Precautions / Restrictions Precautions Precautions: Fall Precaution Comments: seizure precautions, on continuous EEG Restrictions Weight Bearing Restrictions: No    Mobility  Bed Mobility Overal bed mobility: Needs Assistance Bed Mobility: Rolling;Sidelying to Sit;Sit to Supine Rolling: +2 for physical assistance;Max assist Sidelying to sit: Max assist;+2 for physical assistance   Sit to supine: Max assist   General bed mobility comments: pt did initiate LE mvmt to EOB when asked to sit EOB however ultimately required maxAX2 to achieve transfer to EOB  Transfers Overall transfer level: Needs assistance Equipment used: 2 person hand held assist (2 person lift with gait belt) Transfers: Sit to/from Stand Sit to Stand: Max assist;+2 physical assistance         General transfer comment: pt initiated power up but ultimately required maxAX2  to achieve full standing, max tactile cues for weight shifting and advancing LE to take steps towards [facility] however pt unable to toelrate and sat down on bed  Ambulation/Gait             General Gait Details: unable   Stairs             Wheelchair Mobility    Modified Rankin (Stroke Patients Only)       Balance Overall balance assessment: Needs assistance Sitting-balance support: Feet supported;No upper extremity supported Sitting balance-Leahy Scale: Poor Sitting balance - Comments: pt with posterior lean tendency requiring verbal and tactile cues to correct, pt ultimately requiring modA to maintain midline upright posture, pt without R lateral list this date Postural control: Posterior lean   Standing balance-Leahy Scale: Zero Standing balance comment: dependent on physical assist                            Cognition Arousal/Alertness: Awake/alert Behavior During Therapy: Flat affect Overall Cognitive Status: Impaired/Different from baseline Area of Impairment: Problem solving;Following commands;Attention                 Orientation Level:  (pt didnt answer orientation questions, stated Name when asked what she likes to be called but could never state her fiances name) Current Attention Level: Focused Memory: Decreased short-term memory Following Commands: Follows one step commands inconsistently;Follows one step commands with increased time Safety/Judgement: Decreased awareness of safety;Decreased awareness of deficits Awareness: Intellectual Problem Solving: Slow processing;Decreased initiation;Difficulty sequencing;Requires verbal cues;Requires tactile cues General Comments: pt very flat with significant delay  in processing and response, pt with about 50% simple command follow, requires tactile cues for initiation of task asked, minimally verbal      Exercises      General Comments General comments (skin integrity, edema, etc.): pt with  EEG still attached      Pertinent Vitals/Pain Pain Assessment: Faces Faces Pain Scale: No hurt    Home Living                      Prior Function            PT Goals (current goals can now be found in the care plan section) Progress towards PT goals: Progressing toward goals    Frequency    Min 4X/week      PT Plan Current plan remains appropriate    Co-evaluation PT/OT/SLP Co-Evaluation/Treatment: Yes Reason for Co-Treatment: Complexity of the patient's impairments (multi-system involvement) PT goals addressed during session: Mobility/safety with mobility        AM-PAC PT "6 Clicks" Mobility   Outcome Measure  Help needed turning from your back to your side while in a flat bed without using bedrails?: A Lot Help needed moving from lying on your back to sitting on the side of a flat bed without using bedrails?: A Lot Help needed moving to and from a bed to a chair (including a wheelchair)?: A Lot Help needed standing up from a chair using your arms (e.g., wheelchair or bedside chair)?: A Lot Help needed to walk in hospital room?: Total Help needed climbing 3-5 steps with a railing? : Total 6 Click Score: 10    End of Session Equipment Utilized During Treatment: Gait belt Activity Tolerance: Patient limited by fatigue Patient left: in bed;with call bell/phone within reach;with family/visitor present Nurse Communication: Mobility status PT Visit Diagnosis: Muscle weakness (generalized) (M62.81);Other abnormalities of gait and mobility (R26.89);Difficulty in walking, not elsewhere classified (R26.2)     Time: 9767-3419 PT Time Calculation (min) (ACUTE ONLY): 24 min  Charges:  $Neuromuscular Re-education: 8-22 mins                     Kittie Plater, PT, DPT Acute Rehabilitation Services Pager #: [phone] Office #: [phone]    Berline Lopes 01/16/2020, 1:48 PM

## 2020-01-17 DIAGNOSIS — G9341 Metabolic encephalopathy: Secondary | ICD-10-CM | POA: Diagnosis not present

## 2020-01-17 DIAGNOSIS — G9340 Encephalopathy, unspecified: Secondary | ICD-10-CM | POA: Diagnosis not present

## 2020-01-17 DIAGNOSIS — I5032 Chronic diastolic (congestive) heart failure: Secondary | ICD-10-CM | POA: Diagnosis not present

## 2020-01-17 LAB — COMPREHENSIVE METABOLIC PANEL
ALT: 18 U/L (ref 0–44)
AST: 17 U/L (ref 15–41)
Albumin: 2.3 g/dL — ABNORMAL LOW (ref 3.5–5.0)
Alkaline Phosphatase: 48 U/L (ref 38–126)
Anion gap: 9 (ref 5–15)
BUN: 22 mg/dL (ref 8–23)
CO2: 25 mmol/L (ref 22–32)
Calcium: 8.7 mg/dL — ABNORMAL LOW (ref 8.9–10.3)
Chloride: 109 mmol/L (ref 98–111)
Creatinine, Ser: 0.71 mg/dL (ref 0.44–1.00)
GFR, Estimated: >60 mL/min (ref >60)
Glucose, Bld: 139 mg/dL — ABNORMAL HIGH (ref 70–99)
Potassium: 3.7 mmol/L (ref 3.5–5.1)
Sodium: 143 mmol/L (ref 135–145)
Total Bilirubin: 0.4 mg/dL (ref 0.3–1.2)
Total Protein: 5.5 g/dL — ABNORMAL LOW (ref 6.5–8.1)

## 2020-01-17 LAB — CBC
HCT: 34.4 % — ABNORMAL LOW (ref 36.0–46.0)
Hemoglobin: 10.8 g/dL — ABNORMAL LOW (ref 12.0–15.0)
MCH: 30.9 pg (ref 26.0–34.0)
MCHC: 31.4 g/dL (ref 30.0–36.0)
MCV: 98.6 fL (ref 80.0–100.0)
Platelets: 344 10*3/uL (ref 150–400)
RBC: 3.49 MIL/uL — ABNORMAL LOW (ref 3.87–5.11)
RDW: 12.5 % (ref 11.5–15.5)
WBC: 12.1 10*3/uL — ABNORMAL HIGH (ref 4.0–10.5)
nRBC: 0 % (ref 0.0–0.2)

## 2020-01-17 LAB — BLOOD CULTURE ID PANEL (REFLEXED) - BCID2

## 2020-01-17 LAB — APTT
aPTT: 81 seconds — ABNORMAL HIGH (ref 24–36)
aPTT: 95 seconds — ABNORMAL HIGH (ref 24–36)

## 2020-01-17 LAB — HEPARIN LEVEL (UNFRACTIONATED)
Heparin Unfractionated: 1.04 IU/mL — ABNORMAL HIGH (ref 0.30–0.70)
Heparin Unfractionated: 0.95 IU/mL — ABNORMAL HIGH (ref 0.30–0.70)

## 2020-01-17 MED ORDER — LEVETIRACETAM 100 MG/ML PO SOLN
1500.0000 mg | Freq: Two times a day (BID) | ORAL | Status: DC
  Administered 2020-01-18 – 2020-01-31 (×26): 1500 mg via ORAL
  Filled 2020-01-17 (×27): qty 15

## 2020-01-17 MED ORDER — LEVETIRACETAM 500 MG PO TABS
1500.0000 mg | ORAL_TABLET | Freq: Two times a day (BID) | ORAL | Status: DC
  Administered 2020-01-17: 1500 mg via ORAL
  Filled 2020-01-17: qty 3

## 2020-01-17 NOTE — Progress Notes (Addendum)
Neurology Progress Note   S:// Patient is seen and examined today.  Not able to tell her name, age.  Able to name simple objects.  Able to follow some commands.  O:// Current vital signs: BP (!) 143/88 (BP Location: Right Arm)   Pulse 73   Temp 98.4 F (36.9 C)   Resp 17   Ht 5\' 2"  (1.575 m)   Wt 76 kg   SpO2 95%   BMI 30.65 kg/m  Vital signs in last 24 hours: Temp:  [98 F (36.7 C)-99 F (37.2 C)] 98.4 F (36.9 C) (01/13 5188) Pulse Rate:  [73-95] 73 (01/13 0638) Resp:  [16-20] 17 (01/13 4166) BP: (121-143)/(70-88) 143/88 (01/13 0630) SpO2:  [95 %] 95 % (01/13 1601) GENERAL: Awake, alert in NAD HEENT: - Normocephalic and atraumatic LUNGS -symmetrical chest rise, normal respiratory effort CV -no JVD, no peripheral edema.   ABDOMEN - Soft, nondistended  Ext: warm, well perfused, no peripheral edema. NEURO:  Mental Status: Alert, not oriented to self, not able to tell her name, age , not able to tell me where she is, date, month, year.  Able to name simple objects. Language: speech is mildly dysarthric.  Slow in responding questions, blanks out sometimes.  Able to name simple objects, not able to repeat.  Cranial Nerves: PERRL, not able to to track finger, Pt observed to move eyes other times. no facial asymmetry, Not able to tell  facial sensation, hearing intact, tongue/uvula/soft palate midline, normal sternocleidomastoid and trapezius muscle strength. No evidence of tongue atrophy or fibrillations Motor:Pt is able to left both hands with Mild drift on Rt side. 3+ in RUE and 5/5 in LUE Not able to lift Rt leg against gravity but is able to wiggle toes. Able to lift Left leg against gravity. Rt LE- 1+/5 and LLE- 4+/5 Reflexes 2+ UE and LE's. Tone: is normal and bulk is normal Sensation- Difficult to assess as Pt not able to tell.  Coordination: Not able to assess as Pt not able to follow commands.  Gait- deferred   Medications  Current Facility-Administered  Medications:  .  acetaminophen (TYLENOL) tablet 650 mg, 650 mg, Oral, Q6H PRN **OR** acetaminophen (TYLENOL) suppository 650 mg, 650 mg, Rectal, Q6H PRN, Doutova, Anastassia, MD .  dexamethasone (DECADRON) injection 4 mg, 4 mg, Intravenous, Q12H, Doutova, Anastassia, MD, 4 mg at 01/17/20 0945 .  ezetimibe (ZETIA) tablet 10 mg, 10 mg, Oral, Daily, Doutova, Anastassia, MD, 10 mg at 01/17/20 0945 .  feeding supplement (ENSURE ENLIVE / ENSURE PLUS) liquid 237 mL, 237 mL, Oral, BID BM, Pahwani, Ravi, MD .  heparin ADULT infusion 100 units/mL (25000 units/29mL), 1,150 Units/hr, Intravenous, Continuous, Pahwani, Ravi, MD, Last Rate: 11.5 mL/hr at 01/16/20 2152, 1,150 Units/hr at 01/16/20 2152 .  hydrALAZINE (APRESOLINE) injection 10 mg, 10 mg, Intravenous, Q6H PRN, Pahwani, Ravi, MD .  levETIRAcetam (KEPPRA) tablet 1,250 mg, 1,250 mg, Oral, BID, Bhagat, Srishti L, MD, 1,250 mg at 01/17/20 0944 .  LORazepam (ATIVAN) injection 2 mg, 2 mg, Intravenous, Once PRN, Bhagat, Srishti L, MD .  multivitamin with minerals tablet 1 tablet, 1 tablet, Oral, Daily, Darliss Cheney, MD, 1 tablet at 01/17/20 0945 .  ondansetron (ZOFRAN) tablet 4 mg, 4 mg, Oral, Q6H PRN **OR** ondansetron (ZOFRAN) injection 4 mg, 4 mg, Intravenous, Q6H PRN, Doutova, Anastassia, MD Labs CBC    Component Value Date/Time   WBC 12.1 (H) 01/17/2020 0433   RBC 3.49 (L) 01/17/2020 0433   HGB 10.8 (L) 01/17/2020 0932  HCT 34.4 (L) 01/17/2020 0433   PLT 344 01/17/2020 0433   MCV 98.6 01/17/2020 0433   MCH 30.9 01/17/2020 0433   MCHC 31.4 01/17/2020 0433   RDW 12.5 01/17/2020 0433   LYMPHSABS 0.7 01/15/2020 0308   MONOABS 0.1 01/15/2020 0308   EOSABS 0.0 01/15/2020 0308   BASOSABS 0.1 01/15/2020 0308    CMP     Component Value Date/Time   NA 143 01/17/2020 0433   NA 141 07/15/2015 0000   NA 141 07/15/2015 0000   K 3.7 01/17/2020 0433   CL 109 01/17/2020 0433   CO2 25 01/17/2020 0433   GLUCOSE 139 (H) 01/17/2020 0433   BUN 22  01/17/2020 0433   BUN 22 (A) 07/15/2015 0000   BUN 22 (A) 07/15/2015 0000   CREATININE 0.71 01/17/2020 0433   CREATININE 0.84 12/26/2018 1217   CALCIUM 8.7 (L) 01/17/2020 0433   PROT 5.5 (L) 01/17/2020 0433   ALBUMIN 2.3 (L) 01/17/2020 0433   AST 17 01/17/2020 0433   ALT 18 01/17/2020 0433   ALKPHOS 48 01/17/2020 0433   BILITOT 0.4 01/17/2020 0433   GFRNONAA >60 01/17/2020 0433   GFRNONAA >60 12/26/2018 1217   GFRAA >60 12/26/2018 1217    glycosylated hemoglobin  Lipid Panel     Component Value Date/Time   CHOL 261 (H) 10/23/2019 0318   TRIG 115 10/23/2019 0318   HDL 56 10/23/2019 0318   CHOLHDL 4.7 10/23/2019 0318   VLDL 23 10/23/2019 0318   LDLCALC 182 (H) 10/23/2019 0318   LDLDIRECT 157.0 03/15/2019 1338     Imaging I have reviewed images in epic and the results pertinent to this consultation are:  CT-scan of the brain  Postsurgical changes from bilateral frontal craniotomy for extra-axial tumor resection. Persistent large surgical cavity with thick nodular margins with the fluid portion measuring up to 5.0 cm, similar to prior MRI. Stable appearance of prominent edema within the white matter of the bilateral frontal lobes, including corpus callosum. 2. Unchanged appearance of extra-axial lesion centered in the left sphenoid wing/cavernous sinus.  MRI examination of the brain  1. Postop resection of bifrontal meningioma. Large surgical cavity is present in the left frontal lobe. There is extensive nodular enhancement around the wall of the cavity compatible with the current meningioma. This has grown rapidly since the prior MRI 11/10/2019. Mild progression of bifrontal white matter edema. 2. Bulky left cavernous carotid meningioma stable.  Assessment: Patient is 75 year old female with past medical history of left frontal meningioma and cavernous sinus meningioma s/p resection presented with altered mental status, concerns for seizures.  MRI revealed  recurrence of left frontal meningioma and bifrontal edema which has grown rapidly since the prior MRI 11/10/2019.  Her symptoms are likely due to worsening of left frontal meningioma. Patient is on Decadron 4 mg IV twice daily and Keppra was increased to1250 mg twice daily yesterday.  EEG on 1/11 showed evidence of epileptogenicity as well as cortical dysfunction arising from bifrontal (left > right) region likely secondary to underlying structural abnormality as well as craniotomy. Patient was put on LTM EEG on 1/11. Rapid response team was called yesterday because of the concerns of worsening of Rt hemiplegia and Rt visual field cut. It was believed to be Delirium vs Focal seizures of Left cerebral cortex.  Pt was given Keppra 1500 mg IV yesterday. This morning Pt is not able to left Rt leg against gravity. Able to wiggle her toes. CXR normal, repeat Urinalysis was ordered yesterday. Blood  culture 1 tube is positive for Staph (most likely contaminated).   Impression:  Meningioma Likely recurrence of left frontal meningioma Underlying seizure disorder  Recommendations: -Order Urinalysis -Increase Keppra to 1500 mg twice daily -Continue Decadron 4 mg IV twice daily -Continue telemetry -Continue LTM EEG.  -Seizure precautions.  Dr. Armando Reichert MD PGY1 [facility] Neurology   Attending addendum Patient seen and examined Has a fluctuating exam On my examination earlier this morning, she was nonverbal but she was able to follow commands and mimic.  No focal cranial nerve deficits.  No motor deficits that she was able to lift all 4 extremities antigravity with subtle weakness on the right compared to the left.  I would continue the EEG, increase the Keppra to 1500 twice daily and watch her for at least another day. Continue Decadron as well. I will continue to follow with you.  -- Amie Portland, MD Neurologist [facility] Pager: [phone]

## 2020-01-17 NOTE — Care Plan (Signed)
Per Nursing: Patient not able to swallow whole pills and ended up chewing them.  Keppra changed to liquid from tablet formulation  Lesleigh Noe MD-PhD [facility] [phone]

## 2020-01-17 NOTE — Progress Notes (Signed)
PHARMACY - PHYSICIAN COMMUNICATION CRITICAL VALUE ALERT - BLOOD CULTURE IDENTIFICATION (BCID)  [name] [name] is an 75 y.o. female who presented to [facility] on 01/14/2020 with a chief complaint of altered mental status  Assessment:  75 year old woman with blood cultures drawn on admission.  One was found to have coagulase negative staph.  She is afebrile and not on antibiotics.  I suspect contamination  Name of physician (or Provider) Contacted: Dr. Doristine Bosworth notified  Current antibiotics: none  Changes to prescribed antibiotics recommended: Do not recommend starting antibiotics at this time   Results for orders placed or performed during the hospital encounter of 01/14/20  Blood Culture ID Panel (Reflexed) (Collected: 01/14/2020  6:29 PM)  Result Value Ref Range   Enterococcus faecalis NOT DETECTED NOT DETECTED   Enterococcus Faecium NOT DETECTED NOT DETECTED   Listeria monocytogenes NOT DETECTED NOT DETECTED   Staphylococcus species DETECTED (A) NOT DETECTED   Staphylococcus aureus (BCID) NOT DETECTED NOT DETECTED   Staphylococcus epidermidis NOT DETECTED NOT DETECTED   Staphylococcus lugdunensis NOT DETECTED NOT DETECTED   Streptococcus species NOT DETECTED NOT DETECTED   Streptococcus agalactiae NOT DETECTED NOT DETECTED   Streptococcus pneumoniae NOT DETECTED NOT DETECTED   Streptococcus pyogenes NOT DETECTED NOT DETECTED   A.calcoaceticus-baumannii NOT DETECTED NOT DETECTED   Bacteroides fragilis NOT DETECTED NOT DETECTED   Enterobacterales NOT DETECTED NOT DETECTED   Enterobacter cloacae complex NOT DETECTED NOT DETECTED   Escherichia coli NOT DETECTED NOT DETECTED   Klebsiella aerogenes NOT DETECTED NOT DETECTED   Klebsiella oxytoca NOT DETECTED NOT DETECTED   Klebsiella pneumoniae NOT DETECTED NOT DETECTED   Proteus species NOT DETECTED NOT DETECTED   Salmonella species NOT DETECTED NOT DETECTED   Serratia marcescens NOT DETECTED NOT DETECTED   Haemophilus influenzae  NOT DETECTED NOT DETECTED   Neisseria meningitidis NOT DETECTED NOT DETECTED   Pseudomonas aeruginosa NOT DETECTED NOT DETECTED   Stenotrophomonas maltophilia NOT DETECTED NOT DETECTED   Candida albicans NOT DETECTED NOT DETECTED   Candida auris NOT DETECTED NOT DETECTED   Candida glabrata NOT DETECTED NOT DETECTED   Candida krusei NOT DETECTED NOT DETECTED   Candida parapsilosis NOT DETECTED NOT DETECTED   Candida tropicalis NOT DETECTED NOT DETECTED   Cryptococcus neoformans/gattii NOT DETECTED NOT DETECTED    Candie Mile 01/17/2020  9:46 AM

## 2020-01-17 NOTE — Progress Notes (Signed)
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 Family denies slurred speech, but she has trouble finding words for sentences. The patient had a MRI done 3 days ago that showed recurrence of left frontal meningioma at the resection site.   Pt unable to provide her own hx. She is undergoing EEG as I enter her bay. It has since been read and it demonstrated evidence of epileptogenicity as well as cortical dysfunction arising from bifrontal lobes (left >right). No seizures were seen during EEG or during hospital stay.  Neurology has recommended continuation of keppra 750 mg bid, decadron 4 mg IV bid, LTM EEG, telemetry monitoring, and seizure precautions.  Dr. Rory Percy also spoke with Dr. Mickeal Skinner, the patient's neurosurgeon. Dr. Mickeal Skinner apparently does not feel that the progression of the meningioma alone can explain the patient's symptoms. Concern is for intermittent seizures. He will see that patient himself.   [facility] were consulted to admit the patient for further evaluation and treatment. Neurology was consulted.   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  Seizures: Mainly secondary to  recurrence of meningioma and seizure activity.  Neurology and neurooncology on board.  Patient is undergoing LTM.  She remains on Keppra as well as dexamethasone.  According to the reports and notes, there was a rapid response called last night due to decreased responsiveness and right facial droop on the patient.  Patient was evaluated by neurology at the bedside as well.  According to the notes, patient symptoms had resolved quickly.  It was opined by neurologist that it was likely secondary to seizure.  No repeat head scan was done.  Currently, patient is slow in response like she was yesterday.  She is following commands up to only closing and opening her eyes and sticking her tongue out but she is not following commands to lift her extremities.  Once lifted, she is able to hold her left upper extremity but does not have the strength to hold her right upper extremity.  She cannot lift her right lower extremity above gravity either.  Further plan of care by neuro-oncology and neurology.  I have consulted palliative care per neurology and neuro-oncology recommendations.  Hyperlipidemia: Continue Zetia.  Failure to thrive: Reported poor PO intake in recent weeks.  Will consult nutrition.  SIRS: On admission due to elevated WBC and fever. However, this is resolving with hydration. Believe it is likely due to hemoconcentration alone. Fever of 100.4 may have been due to seizure activity. No source of infection has been identified.  She has remained afebrile since then.  History of pulmonary embolus: The patient is on a heparin drip due to perceived risk of bleeding due to hemangioma. Dr. Rory Percy feels that this is unlikely.   Chronic diastolic CHF: Appears euvolemic.  Elevated blood  pressure: Blood pressure now much better.  Continue as needed hydralazine.  DVT prophylaxis: SCDs Start: 01/14/20 2234   Code Status: Full Code  Family Communication:  None present at bedside.    Status is:  Inpatient  Remains inpatient appropriate because:Inpatient level of care appropriate due to severity of illness   Dispo: The patient is from: Home              Anticipated d/c is to: Home              Anticipated d/c date is: 2 days              Patient currently is not medically stable to d/c.        Estimated body mass index is 30.65 kg/m as calculated from the following:   Height as of this encounter: 5\' 2"  (1.575 m).   Weight as of this encounter: 76 kg.      Nutritional status:  Nutrition Problem: Increased nutrient needs Etiology: acute illness   Signs/Symptoms: estimated needs   Interventions: Ensure Enlive (each supplement provides 350kcal and 20 grams of protein)    Consultants:   Neuro-oncology  Neurology  Procedures:   None  Antimicrobials:  Anti-infectives (From admission, onward)   None         Subjective: Patient seen and examined.  Awake but slow in response as she was yesterday.  Looks comfortable.  Objective: Vitals:   01/16/20 1446 01/16/20 1956 01/16/20 2134 01/17/20 0638  BP: 121/70 127/78 122/81 (!) 143/88  Pulse: 95 90 84 73  Resp: 20 18 16 17   Temp: 98.1 F (36.7 C) 98 F (36.7 C) 99 F (37.2 C) 98.4 F (36.9 C)  TempSrc:  Oral    SpO2: 95% 95% 95% 95%  Weight:      Height:        Intake/Output Summary (Last 24 hours) at 01/17/2020 1013 Last data filed at 01/17/2020 0955 Gross per 24 hour  Intake 489.41 ml  Output 400 ml  Net 89.41 ml   Filed Weights   01/14/20 2200 01/16/20 0030  Weight: 76 kg 76 kg    Examination:  General exam: Appears calm and comfortable  Respiratory system: Clear to auscultation. Respiratory effort normal. Cardiovascular system: S1 & S2 heard, RRR. No JVD, murmurs, rubs, gallops or clicks. No pedal edema. Gastrointestinal system: Abdomen is nondistended, soft and nontender. No organomegaly or masses felt. Normal bowel sounds heard. Central nervous system: Alert but not oriented.   Right upper extremity and right lower extremity weakness. Skin: No rashes, lesions or ulcers.   Data Reviewed: I have personally reviewed following labs and imaging studies  CBC: Recent Labs  Lab 01/14/20 1735 01/14/20 2218 01/15/20 0308 01/16/20 0222 01/17/20 0433  WBC 11.9*  --  11.6* 16.1* 12.1*  NEUTROABS 8.9*  --  10.7*  --   --   HGB 13.6 10.5* 13.1 12.3 10.8*  HCT 43.9 31.0* 40.8 39.1 34.4*  MCV 100.0  --  100.5* 97.8 98.6  PLT 403*  --  379 370 462   Basic Metabolic Panel: Recent Labs  Lab 01/14/20 1735 01/14/20 2218 01/15/20 0308 01/16/20 0605 01/17/20 0433  NA 144 147* 144 145 143  K 4.1 3.1* 3.6 3.6 3.7  CL 103  --  106 108 109  CO2 26  --  21* 26 25  GLUCOSE 89  --  126* 124* 139*  BUN 15  --  16 18 22   CREATININE 0.85  --  0.80 0.75 0.71  CALCIUM 9.4  --  9.2 9.1 8.7*  MG 2.3  --  2.2  --   --   PHOS 3.9  --  4.4  --   --    GFR: Estimated Creatinine Clearance: 60.7 mL/min (by C-G formula based on SCr of 0.71 mg/dL). Liver Function Tests: Recent Labs  Lab 01/14/20 1735 01/15/20 0308 01/17/20 0433  AST 30 18 17   ALT 18 17 18   ALKPHOS 66 62 48  BILITOT 1.0 1.1 0.4  PROT 6.9 6.6 5.5*  ALBUMIN 3.1* 2.9* 2.3*   No results for input(s): LIPASE, AMYLASE in the last 168 hours. Recent Labs  Lab 01/14/20 1758  AMMONIA 51*   Coagulation Profile: Recent Labs  Lab 01/14/20 1735  INR 1.5*   Cardiac Enzymes: Recent Labs  Lab 01/14/20 1735  CKTOTAL 72   BNP (last 3 results) Recent Labs    11/19/19 1512  PROBNP 96.0   HbA1C: No results for input(s): HGBA1C in the last 72 hours. CBG: No results for input(s): GLUCAP in the last 168 hours. Lipid Profile: No results for input(s): CHOL, HDL, LDLCALC, TRIG, CHOLHDL, LDLDIRECT in the last 72 hours. Thyroid Function Tests: Recent Labs    01/15/20 0308  TSH 0.629   Anemia Panel: No results for input(s): VITAMINB12, FOLATE, FERRITIN, TIBC, IRON, RETICCTPCT in the last 72 hours. Sepsis  Labs: Recent Labs  Lab 01/14/20 1735 01/14/20 1758  PROCALCITON <0.10  --   LATICACIDVEN  --  1.1    Recent Results (from the past 240 hour(s))  Blood culture (routine x 2)     Status: None (Preliminary result)   Collection Time: 01/14/20  6:29 PM   Specimen: BLOOD  Result Value Ref Range Status   Specimen Description BLOOD SITE NOT SPECIFIED  Final   Special Requests   Final    BOTTLES DRAWN AEROBIC AND ANAEROBIC Blood Culture adequate volume   Culture  Setup Time   Final    GRAM POSITIVE COCCI IN CLUSTERS AEROBIC BOTTLE ONLY Organism ID to follow CRITICAL RESULT CALLED TO, READ BACK BY AND VERIFIED WITH: Andres Shad PharmD 8:55 01/17/20 ([email or web address]) Performed at [facility], [place] [address] [place], [place] [address]    Culture GRAM POSITIVE COCCI  Final   Report Status PENDING  Incomplete  Blood Culture ID Panel (Reflexed)     Status: Abnormal   Collection Time: 01/14/20  6:29 PM  Result Value Ref Range Status   Enterococcus faecalis NOT DETECTED NOT DETECTED Final   Enterococcus Faecium NOT DETECTED NOT DETECTED Final   Listeria monocytogenes NOT DETECTED NOT DETECTED Final   Staphylococcus species DETECTED (A) NOT DETECTED Final    Comment: CRITICAL RESULT CALLED TO, READ BACK BY AND VERIFIED WITH: Andres Shad PharmD 8:55 01/17/20 ([email or web address])    Staphylococcus aureus (BCID) NOT DETECTED NOT DETECTED Final   Staphylococcus epidermidis NOT DETECTED NOT DETECTED Final   Staphylococcus lugdunensis NOT DETECTED NOT DETECTED Final   Streptococcus species NOT DETECTED NOT DETECTED Final   Streptococcus agalactiae NOT DETECTED NOT DETECTED Final   Streptococcus pneumoniae NOT DETECTED NOT DETECTED Final   Streptococcus pyogenes NOT DETECTED NOT DETECTED Final   A.calcoaceticus-baumannii NOT DETECTED NOT DETECTED Final   Bacteroides fragilis NOT DETECTED NOT DETECTED Final   Enterobacterales NOT DETECTED NOT DETECTED Final   Enterobacter cloacae complex NOT DETECTED NOT DETECTED  Final   Escherichia coli NOT DETECTED NOT DETECTED Final   Klebsiella aerogenes NOT DETECTED NOT DETECTED Final  Klebsiella oxytoca NOT DETECTED NOT DETECTED Final   Klebsiella pneumoniae NOT DETECTED NOT DETECTED Final   Proteus species NOT DETECTED NOT DETECTED Final   Salmonella species NOT DETECTED NOT DETECTED Final   Serratia marcescens NOT DETECTED NOT DETECTED Final   Haemophilus influenzae NOT DETECTED NOT DETECTED Final   Neisseria meningitidis NOT DETECTED NOT DETECTED Final   Pseudomonas aeruginosa NOT DETECTED NOT DETECTED Final   Stenotrophomonas maltophilia NOT DETECTED NOT DETECTED Final   Candida albicans NOT DETECTED NOT DETECTED Final   Candida auris NOT DETECTED NOT DETECTED Final   Candida glabrata NOT DETECTED NOT DETECTED Final   Candida krusei NOT DETECTED NOT DETECTED Final   Candida parapsilosis NOT DETECTED NOT DETECTED Final   Candida tropicalis NOT DETECTED NOT DETECTED Final   Cryptococcus neoformans/gattii NOT DETECTED NOT DETECTED Final    Comment: Performed at [facility], [place] [address] [place], [place] [address]  Urine culture     Status: None   Collection Time: 01/14/20  7:34 PM   Specimen: Urine, Clean Catch  Result Value Ref Range Status   Specimen Description URINE, CLEAN CATCH  Final   Special Requests NONE  Final   Culture   Final    NO GROWTH Performed at [facility], [place] [address], [place], [place] [address]    Report Status 01/16/2020 FINAL  Final  Resp Panel by RT-PCR (Flu A&B, Covid) Nasopharyngeal Swab     Status: None   Collection Time: 01/14/20  8:11 PM   Specimen: Nasopharyngeal Swab; Nasopharyngeal(NP) swabs in vial transport medium  Result Value Ref Range Status   SARS Coronavirus 2 by RT PCR NEGATIVE NEGATIVE Final    Comment: (NOTE) SARS-CoV-2 target nucleic acids are NOT DETECTED.  The SARS-CoV-2 RNA is generally detectable in upper respiratory specimens during the acute phase of infection. The  lowest concentration of SARS-CoV-2 viral copies this assay can detect is 138 copies/mL. A negative result does not preclude SARS-Cov-2 infection and should not be used as the sole basis for treatment or other patient management decisions. A negative result may occur with  improper specimen collection/handling, submission of specimen other than nasopharyngeal swab, presence of viral mutation(s) within the areas targeted by this assay, and inadequate number of viral copies(<138 copies/mL). A negative result must be combined with clinical observations, patient history, and epidemiological information. The expected result is Negative.  Fact Sheet for Patients:  EntrepreneurPulse.com.au  Fact Sheet for Healthcare Providers:  IncredibleEmployment.be  This test is no t yet approved or cleared by the [place] FDA and  has been authorized for detection and/or diagnosis of SARS-CoV-2 by FDA under an Emergency Use Authorization (EUA). This EUA will remain  in effect (meaning this test can be used) for the duration of the COVID-19 declaration under Section 564(b)(1) of the Act, 21 U.S.C.section 360bbb-3(b)(1), unless the authorization is terminated  or revoked sooner.       Influenza A by PCR NEGATIVE NEGATIVE Final   Influenza B by PCR NEGATIVE NEGATIVE Final    Comment: (NOTE) The Xpert Xpress SARS-CoV-2/FLU/RSV plus assay is intended as an aid in the diagnosis of influenza from Nasopharyngeal swab specimens and should not be used as a sole basis for treatment. Nasal washings and aspirates are unacceptable for Xpert Xpress SARS-CoV-2/FLU/RSV testing.  Fact Sheet for Patients: EntrepreneurPulse.com.au  Fact Sheet for Healthcare Providers: IncredibleEmployment.be  This test is not yet approved or cleared by the [place] FDA and has been authorized for detection  and/or diagnosis of SARS-CoV-2 by FDA under  an Emergency Use Authorization (EUA). This EUA will remain in effect (meaning this test can be used) for the duration of the COVID-19 declaration under Section 564(b)(1) of the Act, 21 U.S.C. section 360bbb-3(b)(1), unless the authorization is terminated or revoked.  Performed at [facility], [place] [address] [place], [place] [address]       Radiology Studies: DG CHEST PORT 1 VIEW  Result Date: 01/16/2020 CLINICAL DATA:  75 year old female with altered mental status. EXAM: PORTABLE CHEST 1 VIEW COMPARISON:  Chest radiograph dated 01/14/2020. FINDINGS: No focal consolidation, pleural effusion, or pneumothorax. Stable cardiomediastinal silhouette. No acute osseous pathology. IMPRESSION: No active disease.  No interval change. Electronically Signed   By: Anner Crete M.D.   On: 01/16/2020 20:59   EEG adult  Result Date: 01/15/2020 Lora Havens, MD     01/15/2020 10:27 AM Patient Name: [name] [name] MRN: [id number] Epilepsy Attending: Lora Havens Referring Physician/Provider: Dr Leonie Man Date: 01/15/2020 Duration: 25.56 mins Patient history: 75 year old woman with bilateral frontal meningiomas worse on the left status post multiple craniotomies for recurrence, presenting for 7 to 10 days worth of altered mental status, difficulty with ambulation and confusion along with word finding difficulty. EEG to evaluate for seizure  Level of alertness: Awake AEDs during EEG study: LEV Technical aspects: This EEG study was done with scalp electrodes positioned according to the 10-20 International system of electrode placement. Electrical activity was acquired at a sampling rate of 500Hz  and reviewed with a high frequency filter of 70Hz  and a low frequency filter of 1Hz . EEG data were recorded continuously and digitally stored. Description: The posterior dominant rhythm consists of 9-10 Hz activity of moderate voltage (25-35 uV) seen predominantly in posterior head regions, symmetric  and reactive to eye opening and eye closing. EEG showed 3 to 5 Hz theta-delta slowing in bifrontal region with sharply contoured morphology.  Sharp waves were also noted in bifrontal region.  Hyperventilation and photic stimulation were not performed.    ABNORMALITY -Sharp waves, bifrontal, left>right -Continuous slow, bifrontal region - Breech artifact, bifrontal region  IMPRESSION: This study showed evidence of epileptogenicity as well as cortical dysfunction arising from bifrontal ( left>right) region likely secondary to underlying structural abnormality as well as craniotomy.  No seizures were seen throughout the recording.  Priyanka [name]   Overnight EEG with video  Result Date: 01/16/2020 Lora Havens, MD     01/17/2020  8:32 AM Patient Name: [name] [name] MRN: [id number] Epilepsy Attending: Lora Havens Referring Physician/Provider: Dr Amie [place] Duration:  01/15/2020 1529 to 01/16/2020 1529  Patient history: 75 year old woman withbilateral frontal meningiomas worse on the left status post multiple craniotomies for recurrence, presenting for 7 to 10 days worth of altered mental status, difficulty with ambulation and confusion along with word finding difficulty. EEG to evaluate for seizure  Level of alertness: Awake, asleep  AEDs during EEG study: LEV  Technical aspects: This EEG study was done with scalp electrodes positioned according to the 10-20 International system of electrode placement. Electrical activity was acquired at a sampling rate of 500Hz  and reviewed with a high frequency filter of 70Hz  and a low frequency filter of 1Hz . EEG data were recorded continuously and digitally stored.  Description: The posterior dominant rhythm consists of 9-10 Hz activity of moderate voltage (25-35 uV) seen predominantly in posterior head regions, symmetric and reactive to eye opening and eye closing.   Sleep was characterized by vertex  waves, sleep spindles (12 to 14 Hz), maximal frontocentral  region. EEG showed 3 to 5 Hz theta-delta slowing in bifrontal region with sharply contoured morphology which appears rhythmic at times.sharp waves were also noted in bifrontal region. Hyperventilation and photic stimulation were not performed.   ABNORMALITY -Sharp waves, bifrontal, left>right -Continuous slow,bifrontal region - Breech artifact, bifrontal region  IMPRESSION: This studyshowed evidence of epileptogenicity as well as cortical dysfunction arising from bifrontal ( left>right) region likely secondary to underlying structural abnormality as well as craniotomy. The sharp waves at times appear rhythmic which is on the ictal- interictal continuum. No definite seizureswere seen throughout the recording.  Priyanka [name]   Scheduled Meds: . dexamethasone (DECADRON) injection  4 mg Intravenous Q12H  . ezetimibe  10 mg Oral Daily  . feeding supplement  237 mL Oral BID BM  . levETIRAcetam  1,250 mg Oral BID  . multivitamin with minerals  1 tablet Oral Daily   Continuous Infusions: . heparin 1,150 Units/hr (01/16/20 2152)     LOS: 3 days   Time spent: 30 minutes   Darliss Cheney, MD [facility]  01/17/2020, 10:13 AM   To contact the attending provider between 7A-7P or the covering provider during after hours 7P-7A, please log into the web site [email or web address].

## 2020-01-17 NOTE — Progress Notes (Signed)
[place] for Heparin Indication:  Recent PE  Allergies  Allergen Reactions  . Bee Venom Swelling and Other (See Comments)    Site of swelling not recalled by family- happened during childhood  . Losartan Itching    Patient Measurements: Height: 5\' 2"  (157.5 cm) Weight: 76 kg (167 lb 8.8 oz) IBW/kg (Calculated) : 50.1 Heparin Dosing Weight: 66.6 kg  Vital Signs: Temp: 98.4 F (36.9 C) (01/13 0638) BP: 143/88 (01/13 0638) Pulse Rate: 73 (01/13 0638)  Labs: Recent Labs    01/14/20 1735 01/14/20 2218 01/15/20 0308 01/15/20 1100 01/16/20 0222 01/16/20 0605 01/16/20 1731 01/17/20 0433 01/17/20 1206  HGB 13.6   < > 13.1  --  12.3  --   --  10.8*  --   HCT 43.9   < > 40.8  --  39.1  --   --  34.4*  --   PLT 403*  --  379  --  370  --   --  344  --   APTT  --   --  84*   < > 61* 63* 61* 81* 95*  LABPROT 17.9*  --   --   --   --   --   --   --   --   INR 1.5*  --   --   --   --   --   --   --   --   HEPARINUNFRC  --    < > >2.20*  --  1.28*  --   --  1.04* 0.95*  CREATININE 0.85  --  0.80  --   --  0.75  --  0.71  --   CKTOTAL 72  --   --   --   --   --   --   --   --    < > = values in this interval not displayed.    Estimated Creatinine Clearance: 60.7 mL/min (by C-G formula based on SCr of 0.71 mg/dL).  Assessment: 75 yr old female admitted for AMS and concerns for seizure, likely 2/2 expanding left frontal meningioma. Patient was on apixaban PTA for a recent PE in November (last dose apixaban unknown). Pharmacy has been asked to assist with transition to heparin 2/2 AMS and possible neuro interventions.  APTT is therapeutic and trending up.  Heparin level is elevated due to the effect of Eliquis.  No bleeding reported.  Goal of Therapy:  aPTT 66-102 sec Heparin level 0.3-0.7 units/mL Monitor platelets by anticoagulation protocol: Yes   Plan:  Reduce heparin gtt slightly to 1100 units/hr Daily heparin level, aPTT and  CBC  Charmelle Soh D. Mina Marble, PharmD, BCPS, [place] 01/17/2020, 1:08 PM

## 2020-01-17 NOTE — Procedures (Signed)
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 Electrical activity was acquired at a sampling rate of 500Hz  and reviewed with a high frequency filter of 70Hz  and a low frequency filter of 1Hz . EEG data were recorded continuously and digitally stored.   Description:The posterior dominant rhythm consists of 9-10 Hz activity of moderate voltage (25-35 uV) seen predominantly in posterior head regions, symmetric and reactive to eye opening and eye closing. Sleep was characterized by vertex waves, sleep spindles (12 to 14 Hz), maximal frontocentral region. EEG showed 3 to 5 Hz theta-delta slowing in bifrontal regionwith sharply contoured morphology which appears rhythmic at times.Sharp waves were also noted in bifrontal region.  Patient event was recorded at [facility] on 01/16/2020 during which patient was noted to be less responsive.  Concomitant EEG before, during and after the event continued to show left>right sharp waves which appeared rhythmic without definite evolution. After 2200, the frequency of sharp waves improved and did not appear rhythmic  ABNORMALITY -Sharp waves, bifrontal, left>right -Continuous slow,bifrontal region - Breech artifact, bifrontal region  IMPRESSION: This studyshowed evidence of epileptogenicity as well as cortical dysfunction  arising from bifrontal( left>right)region likely secondary to underlying structural abnormality as well as craniotomy. The sharp waves at times appear rhythmic which is on the ictal- interictal continuum.  After 2200, the frequency of sharp waves improved and did not appear rhythmic.   Patient event was recorded at [facility] on 01/16/2020 during which patient was noted to be less responsive.  Concomitant EEG continued to show left>right sharp waves which appeared rhythmic without definite evolution.  However, focal seizures may not be seen on scalp EEG.  Therefore clinical correlation is recommended.  Giovonnie Trettel [name]

## 2020-01-17 NOTE — Progress Notes (Signed)
Physical Therapy Treatment Patient Details Name: [name] [name] MRN: [id number] DOB: [birth year] Today's Date: 01/17/2020    History of Present Illness Pt is 75 yo female with onset of AMS and suspected seizures was brought to ED, has noted acute encephalopathy.  Pt with continuous EEG at this time.  Pt has history of recurrent frontal B meningiomas with craniotomies.  PMHx:  meningiomas frontal lobes, skin CA, radiation of brain, HTN, HLD, PONV, IBS, sob, osteoporosis, steroid use,    PT Comments    Pt demonstrating excellent progress today.  She was able to sit EOB and stand with mod A of 1 to min A of 2.  She needs verbal and tactile cues for initiation with transfers and exercises.  She did take a couple steps at EOB with assist to weight shift.  Limited distance due to EEG lines and fatigue.   Follow Up Recommendations  CIR     Equipment Recommendations       Recommendations for Other Services Rehab consult     Precautions / Restrictions Precautions Precautions: Fall Precaution Comments: seizure precautions Restrictions Weight Bearing Restrictions: No    Mobility  Bed Mobility Overal bed mobility: Needs Assistance Bed Mobility: Supine to Sit;Sit to Supine     Supine to sit: Mod assist Sit to supine: Mod assist;+2 for safety/equipment   General bed mobility comments: Supine to sit: pt was able to advance L LE to EOB, min A and increased cues for R LE, with cues reached with R UE and mod A to lift trunk.  Sit to supine: Cues for sequence mod A of 2 for line managment and safety with return to supine  Transfers Overall transfer level: Needs assistance Equipment used: 2 person hand held assist Transfers: Sit to/from Stand Sit to Stand: Min assist;+2 physical assistance         General transfer comment: Performed sit to stand x 4 with tactile cues to initiate and min A of 2 to power up.  Ambulation/Gait Ambulation/Gait assistance: Min assist;+2 physical  assistance Gait Distance (Feet): 2 Feet Assistive device: 2 person hand held assist Gait Pattern/deviations: Step-to pattern;Decreased stride length;Shuffle Gait velocity: decreased   General Gait Details: Side steps toward HOB, required assist to weight shift for steps and min A of 2 to steady; limited distance due to EEG lines and pt's fatigue   Stairs             Wheelchair Mobility    Modified Rankin (Stroke Patients Only)       Balance Overall balance assessment: Needs assistance Sitting-balance support: Feet supported;No upper extremity supported;Bilateral upper extremity supported;Single extremity supported Sitting balance-Leahy Scale: Fair Sitting balance - Comments: Pt sat EOB for 15 mins during session.  At times tending to lean posteriorly and right requiring min A for balance; however, maintained 90% of the time without support.     Standing balance-Leahy Scale: Poor Standing balance comment: Pt requiring bil UE support and min A for balance.  Stood 4 x for ~30-45 secs each rep                            Cognition Arousal/Alertness: Awake/alert Behavior During Therapy: Flat affect Overall Cognitive Status: Impaired/Different from baseline Area of Impairment: Orientation;Attention;Memory;Following commands;Safety/judgement;Awareness;Problem solving                 Orientation Level: Time;Situation Current Attention Level: Sustained Memory: Decreased recall of precautions;Decreased short-term memory Following Commands: Follows one  step commands inconsistently Safety/Judgement: Decreased awareness of safety;Decreased awareness of deficits Awareness: Intellectual Problem Solving: Slow processing;Requires tactile cues;Decreased initiation;Difficulty sequencing;Requires verbal cues General Comments: Pt with inconsitent ability to answer questions and following commands. R sided inattention      Exercises General Exercises - Lower  Extremity Ankle Circles/Pumps: AAROM;Right;10 reps;Seated Long Arc Quad: AAROM;10 reps;Seated;Right Heel Slides: AAROM;Right;10 reps;Supine Hip ABduction/ADduction: AAROM;Right;10 reps;Supine Other Exercises Other Exercises: Pt with difficulty following commands on R side - required AAROM for exercises to initiate but able to maintain positions and continue motions once initiated    General Comments General comments (skin integrity, edema, etc.): VSS; EEG attached      Pertinent Vitals/Pain Pain Assessment: No/denies pain    Home Living                      Prior Function            PT Goals (current goals can now be found in the care plan section) Acute Rehab PT Goals Patient Stated Goal: Pt unable to state. fiance would like to increase pt independence to be able to go home PT Goal Formulation: With patient/family Time For Goal Achievement: 01/22/20 Potential to Achieve Goals: Good Progress towards PT goals: Progressing toward goals    Frequency    Min 4X/week      PT Plan Current plan remains appropriate    Co-evaluation              AM-PAC PT "6 Clicks" Mobility   Outcome Measure  Help needed turning from your back to your side while in a flat bed without using bedrails?: A Little Help needed moving from lying on your back to sitting on the side of a flat bed without using bedrails?: A Lot Help needed moving to and from a bed to a chair (including a wheelchair)?: A Lot Help needed standing up from a chair using your arms (e.g., wheelchair or bedside chair)?: A Lot Help needed to walk in hospital room?: A Lot Help needed climbing 3-5 steps with a railing? : Total 6 Click Score: 12    End of Session Equipment Utilized During Treatment: Gait belt Activity Tolerance: Other (comment) (did not transfer to chair due to EEG lines and unable to position chair near EEG) Patient left: in bed;with call bell/phone within reach;with family/visitor  present;with bed alarm set;with nursing/sitter in room (in chair position) Nurse Communication: Mobility status PT Visit Diagnosis: Muscle weakness (generalized) (M62.81);Other abnormalities of gait and mobility (R26.89);Difficulty in walking, not elsewhere classified (R26.2)     Time: 8416-6063 PT Time Calculation (min) (ACUTE ONLY): 23 min  Charges:  $Therapeutic Exercise: 8-22 mins $Therapeutic Activity: 8-22 mins                     Abran Richard, PT Acute Rehab Services Pager [phone] [facility] [phone]     Karlton Lemon 01/17/2020, 12:18 PM

## 2020-01-17 NOTE — Plan of Care (Signed)

## 2020-01-17 NOTE — Progress Notes (Signed)
Inpatient Rehab Admissions:  Inpatient Rehab Consult received.  I met with patient and her significant other at the bedside for rehabilitation assessment and to discuss goals and expectations of an inpatient rehab admission.  We discussed average length of stay to be about 2 weeks, with increased/decresed length of stay based on progress.  Goals would be expected to be supervision level for mobility and ADLs and significant other confirms that 24/7 supervision can be provided.  Plan for pt to d/c home with him and he and friends will provide supervision.  We discussed timing of insurance authorization, and that insurance will not approve SNF following a CIR admission.  He verbalizes understanding, and does agree to continue with CIR at this time.  Will follow for medical workup and open insurance when appropriate.   Signed: Shann Medal, PT, DPT Admissions Coordinator [phone] 01/17/20  2:00 PM

## 2020-01-17 NOTE — Progress Notes (Signed)
[facility] for Heparin Indication:  Anticoagulation While Holding Apixaban  Allergies  Allergen Reactions  . Bee Venom Swelling and Other (See Comments)    Site of swelling not recalled by family- happened during childhood  . Losartan Itching    Patient Measurements: Height: 5\' 2"  (157.5 cm) Weight: 76 kg (167 lb 8.8 oz) IBW/kg (Calculated) : 50.1 Heparin Dosing Weight: 66.6 kg  Vital Signs: Temp: 99 F (37.2 C) (01/12 2134) Temp Source: Oral (01/12 1956) BP: 122/81 (01/12 2134) Pulse Rate: 84 (01/12 2134)  Labs: Recent Labs    01/14/20 1735 01/14/20 2218 01/15/20 0308 01/15/20 1100 01/16/20 0222 01/16/20 0605 01/16/20 1731 01/17/20 0433  HGB 13.6   < > 13.1  --  12.3  --   --  10.8*  HCT 43.9   < > 40.8  --  39.1  --   --  34.4*  PLT 403*  --  379  --  370  --   --  344  APTT  --   --  84*   < > 61* 63* 61* 81*  LABPROT 17.9*  --   --   --   --   --   --   --   INR 1.5*  --   --   --   --   --   --   --   HEPARINUNFRC  --   --  >2.20*  --  1.28*  --   --  1.04*  CREATININE 0.85  --  0.80  --   --  0.75  --  0.71  CKTOTAL 72  --   --   --   --   --   --   --    < > = values in this interval not displayed.    Estimated Creatinine Clearance: 60.7 mL/min (by C-G formula based on SCr of 0.71 mg/dL).  Assessment: 75 yr old female admitted for AMS and concerns for seizure, likely 2/2 expanding left frontal meningioma. Patient was on apixaban PTA for a recent PE in November (last dose apixaban unknown). Pharmacy has been asked to assist with transition to heparin 2/2 AMS and possible neuro interventions.  APTT ~7 hrs after heparin infusion was increased to 1050 units/hr was 61 sec, which remains below the goal range for this pt. H/H 12.3/39.1, plt 370. Per RN, no issues with IV or bleeding observed.  1/13 AM update:  APTT therapeutic x 1 after rate increase  Goal of Therapy:  aPTT 66-102 seconds Monitor platelets by  anticoagulation protocol: Yes   Plan:  Cont heparin infusion at 1150 units/hr Check aPTT, heparin level in 8 hours Monitor daily CBC, heparin level/aPTT, s/sx bleeding  Narda Bonds, PharmD, BCPS Clinical Pharmacist Phone: [phone]

## 2020-01-18 DIAGNOSIS — Z515 Encounter for palliative care: Secondary | ICD-10-CM | POA: Diagnosis not present

## 2020-01-18 DIAGNOSIS — G9340 Encephalopathy, unspecified: Secondary | ICD-10-CM | POA: Diagnosis not present

## 2020-01-18 DIAGNOSIS — D329 Benign neoplasm of meninges, unspecified: Secondary | ICD-10-CM | POA: Diagnosis not present

## 2020-01-18 DIAGNOSIS — Z66 Do not resuscitate: Secondary | ICD-10-CM

## 2020-01-18 DIAGNOSIS — G4089 Other seizures: Secondary | ICD-10-CM | POA: Diagnosis not present

## 2020-01-18 DIAGNOSIS — Z7189 Other specified counseling: Secondary | ICD-10-CM | POA: Diagnosis not present

## 2020-01-18 DIAGNOSIS — Z86711 Personal history of pulmonary embolism: Secondary | ICD-10-CM | POA: Diagnosis not present

## 2020-01-18 DIAGNOSIS — I2699 Other pulmonary embolism without acute cor pulmonale: Secondary | ICD-10-CM | POA: Diagnosis not present

## 2020-01-18 DIAGNOSIS — I5032 Chronic diastolic (congestive) heart failure: Secondary | ICD-10-CM | POA: Diagnosis not present

## 2020-01-18 DIAGNOSIS — C700 Malignant neoplasm of cerebral meninges: Secondary | ICD-10-CM | POA: Diagnosis not present

## 2020-01-18 LAB — URINALYSIS, ROUTINE W REFLEX MICROSCOPIC
Bilirubin Urine: NEGATIVE
Glucose, UA: NEGATIVE mg/dL
Hgb urine dipstick: NEGATIVE
Ketones, ur: NEGATIVE mg/dL
Nitrite: NEGATIVE
Protein, ur: NEGATIVE mg/dL
Specific Gravity, Urine: 1.015 (ref 1.005–1.030)
pH: 8 (ref 5.0–8.0)

## 2020-01-18 LAB — CULTURE, BLOOD (ROUTINE X 2): Special Requests: ADEQUATE

## 2020-01-18 LAB — CBC
HCT: 40 % (ref 36.0–46.0)
Hemoglobin: 12.7 g/dL (ref 12.0–15.0)
MCH: 30.9 pg (ref 26.0–34.0)
MCHC: 31.8 g/dL (ref 30.0–36.0)
MCV: 97.3 fL (ref 80.0–100.0)
Platelets: 305 10*3/uL (ref 150–400)
RBC: 4.11 MIL/uL (ref 3.87–5.11)
RDW: 12.3 % (ref 11.5–15.5)
WBC: 13.9 10*3/uL — ABNORMAL HIGH (ref 4.0–10.5)
nRBC: 0 % (ref 0.0–0.2)

## 2020-01-18 LAB — HEPARIN LEVEL (UNFRACTIONATED): Heparin Unfractionated: 1.05 IU/mL — ABNORMAL HIGH (ref 0.30–0.70)

## 2020-01-18 LAB — APTT: aPTT: 100 seconds — ABNORMAL HIGH (ref 24–36)

## 2020-01-18 MED ORDER — BIOTENE DRY MOUTH MT LIQD: 15.0000 mL | OROMUCOSAL | Status: DC | PRN

## 2020-01-18 MED ORDER — POLYVINYL ALCOHOL 1.4 % OP SOLN
1.0000 [drp] | Freq: Four times a day (QID) | OPHTHALMIC | Status: DC | PRN
Start: 2020-01-18 — End: 2020-01-31

## 2020-01-18 MED ORDER — MORPHINE SULFATE (CONCENTRATE) 10 MG/0.5ML PO SOLN
5.0000 mg | Freq: Four times a day (QID) | ORAL | Status: DC
Start: 2020-01-18 — End: 2020-01-28
  Administered 2020-01-18 – 2020-01-28 (×38): 5 mg via SUBLINGUAL
  Filled 2020-01-18 (×39): qty 0.5

## 2020-01-18 MED ORDER — GLYCOPYRROLATE 0.2 MG/ML IJ SOLN: 0.2000 mg | INTRAMUSCULAR | Status: DC | PRN

## 2020-01-18 MED ORDER — LORAZEPAM 2 MG/ML IJ SOLN: 1.0000 mg | INTRAMUSCULAR | Status: DC | PRN

## 2020-01-18 NOTE — Consult Note (Cosign Needed Addendum)
Consultation Note Date: 01/18/2020   Patient Name: [name] [name]  DOB: March 30, 1945  MRN: [id number]  Age / Sex: 75 y.o., female  PCP: Copland, Gay Filler, MD Referring Physician: Darliss Cheney, MD  Reason for Consultation: Establishing goals of care  HPI/Patient Profile: 75 y.o. female  with past medical history of left frontal meningioma and sinus meningioma, PE on Eliquis, chronic diastolic CHF, hypertension, osteroporosis presenting to the emergency department on 01/14/2020 with altered mental status, weakness, and difficulty walking for the past 10 days. MRI revealed recurrence of left frontal meningioma and bifrontal edema which has grown rapidly since the prior MRI 11/10/19. She was admitted to [facility] for acute encephalopathy in the setting of progressive meningioma.  EEG 1/11 showed evidence of epileptogenicity as well as cortical dysfunction arising from bifrontal region likely secondary to underlying structural abnormality as well as craniotomy. No seizures were seen during EEG. Neurology has felt her symptoms are likely due to worsening of left frontal meningioma  Primary decision maker: [name] (significant other and HCPOA) [phone]  Clinical Assessment and Goals of Care: I have reviewed medical records including EPIC notes, labs and imaging, examined the patient and met at bedside with significant other [name]  to discuss diagnosis, prognosis, GOC, EOL wishes, disposition, and options.  I introduced Palliative Medicine as specialized medical care for people living with serious illness. It focuses on providing relief from the symptoms and stress of a serious illness.   We discussed a brief life review of the patient. She and [name] have been engaged for 23 years. They have maintained separate residences throughout their time together. She was previously married, but does not have children. Only other  family is cousins. She has designated Geographical information systems officer as her durable POA and healthcare POA.   As far as functional status, patient has been living independently in her home in [place]. She was ambulatory with a cane and independent with ADLs.  About 10 days prior to admission, she had a significant decline with confusion, weakness, and difficulty talking. [name] reports her oral intake had also started to decrease. Discussed that her oral intake has ben minimal since admission. Of note, albumin has decreased significantly from 3.1 on admission to 2.3 on 1/13.   We discussed her oncology history starting with initial diagnosis in 2019.  Discussed that she had done well after the first craniotomy/resection in September 2019, and that she was  stable for months until MRI showed progression of previously debulked left frontal region. She underwent SRS in January 2021, and then underwent a second resection in November 2021. Discussed concern about the degree of progression on imaging since then.   I attempted to elicit values and goals of care important to the patient. [name] expresses "she would not want to live like this".  He reports that PT and OT have noticed some improvement in her functional status and are recommending inpatient rehab. We discussed being cautiously optimistic for much improvement in rehab.    I introduced the concept of a comfort path to  [name] and encouraged him to think about at what point he would want to stop supportive medical interventions and focus on helping the patient feel as best she can for as long as she can with the goal of comfort rather than cure/prolonging life. Introduced hospice philosophy and provided information on home vs residential hospice services - answered all questions.    Advanced directives, concepts specific to code status, artifical feeding and hydration, and rehospitalization were considered and discussed. A MOST form was completed with the following treatment  decisions:   Cardiopulmonary Resuscitation: Do Not Attempt Resuscitation (DNR/No CPR)  Medical Interventions: Comfort Measures: Keep clean, warm, and dry. Use medication by any route, positioning, wound care, and other measures to relieve pain and suffering. Use oxygen, suction and manual treatment of airway obstruction as needed for comfort. Do not transfer to the hospital unless comfort needs cannot be met in current location.  Antibiotics: Determine use of limitation of antibiotics when infection occurs  IV Fluids: IV fluids for a defined trial period  Feeding Tube: No feeding tube    Update:  Dr. Mickeal Skinner notified me that he just spoke with [name] at length. They discussed that her condition is terminal and the tumor would continue to progress. Discussed that she would not benefit from inpatient rehab. [name] has decided to proceed with transition to comfort care and transfer to residential hospice.   Discussed transitioning to comfort care while in the hospital, and what that would look like--keeping her clean and dry, no labs, no artificial hydration or feeding, no antibiotics, minimizing of medications, comfort feeds, and medication for pain and dyspnea.     SUMMARY OF RECOMMENDATIONS    Full comfort measures initiated  Code status changed to DNR/DNI  Transfer to hospice home in [facility] when bed becomes available - TOC consult placed; TOC and hospice liaison notifed  Added orders for symptom management at EOL as well as discontinued orders that were not focused on comfort  D/C heparin infusion  Continue Keppra and Decadron  Provide frequent assessments and administer PRN medications as clinically necessary to ensure EOL comfort  PMT will continue to follow holistically  Code Status/Advance Care Planning:  DNR  Symptom Management:   Morphine solution 5 mg every 6 hours for comfort  Lorazepam (ATIVAN) prn for anxiety  Glycopyrrolate (ROBINUL) for excessive  secretions  Ondansetron (ZOFRAN) prn for nausea  Polyvinyl alcohol (LIQUIFILM TEARS) prn for dry eyes  Antiseptic oral rinse (BIOTENE) prn for dry mouth   Palliative Prophylaxis:   Oral Care and Turn Reposition  Additional Recommendations (Limitations, Scope, Preferences):  Full Comfort Care  Psycho-social/Spiritual:   Created space and opportunity for family to express thoughts and feelings regarding patient's current medical situation.   Emotional support provided   Prognosis:   < 2 weeks  Discharge Planning: Hospice facility      Primary Diagnoses: Present on Admission: . Acute encephalopathy . Meningioma ([place]) . Hyperlipidemia . Acute metabolic encephalopathy . SIRS (systemic inflammatory response syndrome) (HCC) . History of pulmonary embolus (PE) . Chronic diastolic CHF (congestive heart failure) ([place])   I have reviewed the medical record, interviewed the patient and family, and examined the patient. The following aspects are pertinent.  Past Medical History:  Diagnosis Date  . Cancer ([place])    skin cancer   . History of radiation therapy 01/09/19- 01/19/19   SRS brain radiation. 5 fractions to total 25 Gy  . Hyperlipidemia   . Hypertension   . IBS (irritable bowel  syndrome)   . Osteoporosis   . PONV (postoperative nausea and vomiting)    with breast surgery  . SOBOE (shortness of breath on exertion)     Scheduled Meds: . dexamethasone (DECADRON) injection  4 mg Intravenous Q12H  . ezetimibe  10 mg Oral Daily  . feeding supplement  237 mL Oral BID BM  . levETIRAcetam  1,500 mg Oral BID  . multivitamin with minerals  1 tablet Oral Daily   Continuous Infusions: . heparin 1,100 Units/hr (01/17/20 1822)   PRN Meds:.acetaminophen **OR** acetaminophen, hydrALAZINE, LORazepam, ondansetron **OR** ondansetron (ZOFRAN) IV Medications Prior to Admission:  Prior to Admission medications   Medication Sig Start Date End Date Taking? Authorizing Provider   acetaminophen (TYLENOL) 325 MG tablet Take 325-650 mg by mouth every 6 (six) hours as needed for mild pain or headache.   Yes [provider]  apixaban (ELIQUIS) 5 MG TABS tablet Take 1 tablet (5 mg total) by mouth 2 (two) times daily. 12/24/19  Yes Copland, Gay Filler, MD  ezetimibe (ZETIA) 10 MG tablet Take 1 tablet (10 mg total) by mouth daily. 08/01/19  Yes Copland, Gay Filler, MD  furosemide (LASIX) 20 MG tablet Take 1 tablet (20 mg total) by mouth daily. Take one in the am. May take a 2nd dose around noon if desired 12/25/19  Yes Copland, Gay Filler, MD  levETIRAcetam (KEPPRA) 500 MG tablet Take 1 tablet (500 mg total) by mouth 2 (two) times daily. 10/25/19  Yes Little Ishikawa, MD  potassium chloride (KLOR-CON) 10 MEQ tablet TAKE 1 TABLET BY MOUTH DAILY, ON DAYS THAT YOU USE THE LASIX Patient taking differently: Take 10 mEq by mouth daily. TAKE 1 TABLET BY MOUTH DAILY, ON DAYS THAT YOU USE THE LASIX 12/25/19  Yes Copland, Gay Filler, MD   Allergies  Allergen Reactions  . Bee Venom Swelling and Other (See Comments)    Site of swelling not recalled by family- happened during childhood  . Losartan Itching   Review of Systems  Unable to perform ROS: Mental status change    Physical Exam Vitals reviewed.  Constitutional:      General: She is not in acute distress.    Appearance: She is ill-appearing.  Cardiovascular:     Rate and Rhythm: Normal rate and regular rhythm.  Pulmonary:     Effort: Pulmonary effort is normal.  Neurological:     Mental Status: She is alert.     Motor: Weakness present.     Comments: Not able to tell me who she is or where she is Follows commands     Vital Signs: BP (!) 143/89 (BP Location: Right Arm)   Pulse 68   Temp 97.6 F (36.4 C)   Resp 14   Ht $R'5\' 2"'mM$  (1.575 m)   Wt 76 kg   SpO2 97%   BMI 30.65 kg/m  Pain Scale: Faces   Pain Score: 0-No pain   SpO2: SpO2: 97 % O2 Device:SpO2: 97 % O2 Flow Rate: .   IO: Intake/output  summary:   Intake/Output Summary (Last 24 hours) at 01/18/2020 1116 Last data filed at 01/18/2020 1100 Gross per 24 hour  Intake 602.07 ml  Output 1000 ml  Net -397.93 ml    LBM: Last BM Date: 01/18/20 Baseline Weight: Weight: 76 kg Most recent weight: Weight: 76 kg      Palliative Assessment/Data: PPS 20%     Time In: 12:30 Time Out: 13:45 Time Total: 75 minutes Greater than 50%  of this time was spent counseling and coordinating care related to the above assessment and plan.  Signed by: Lavena Bullion, NP   Please contact Palliative Medicine Team phone at [phone] for questions and concerns.  For individual provider: See Shea Evans

## 2020-01-18 NOTE — Procedures (Signed)
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 Electrical activity was acquired at a sampling rate of 500Hz  and reviewed with a high frequency filter of 70Hz  and a low frequency filter of 1Hz . EEG data were recorded continuously and digitally stored.    Description:The posterior dominant rhythm consists of 9-10 Hz activity of moderate voltage (25-35 uV) seen predominantly in posterior head regions, symmetric and reactive to eye opening and eye closing.Sleep was characterized by vertex waves, sleep spindles (12 to 14 Hz), maximal frontocentral region.EEG showed 3 to 5 Hz theta-delta slowing in bifrontal regionwith sharply contoured morphologywhich appears rhythmic at times.Sharp waves were also noted in bifrontal region, at times PLED like at 1hz .    ABNORMALITY -Sharp waves, bifrontal, left>right -Continuous slow,bifrontal region - Breech artifact, bifrontal region  IMPRESSION: This studyshowed evidence of epileptogenicity as well as cortical dysfunction arising from bifrontal( left>right)region likely secondary to underlying structural abnormality as well as craniotomy. The sharp waves at times appear PLED like and rhythmic which is on the ictal-interictal continuum with high potential for seizures.  EEG appears similar to previously.     Priest Lockridge [name]

## 2020-01-18 NOTE — Plan of Care (Signed)

## 2020-01-18 NOTE — Progress Notes (Signed)
Physical Therapy Treatment Patient Details Name: [name] [name] MRN: [id number] DOB: 1945-08-26 Today's Date: 01/18/2020    History of Present Illness Pt is 75 yo female with onset of AMS and suspected seizures was brought to ED, has noted acute encephalopathy.  Pt with continuous EEG at this time.  Pt has history of recurrent frontal B meningiomas with craniotomies.  PMHx:  meningiomas frontal lobes, skin CA, radiation of brain, HTN, HLD, PONV, IBS, sob, osteoporosis, steroid use,    PT Comments    Pt displaying progress as she was able to perform stand step transfer towards L with a RW to chair from EOB this date. She required modAx1 with minAx1 from rehab tech to provide stability, manage lines, manage RW, and cue/assist with bil weight shifting and leg advancement (primarily on the R). Pt displays impaired aerobic and muscular endurance, fatiguing after x2 additional standing bouts lasting ~1-2 min each with bil UE support on RW and modAx1 to provide assistance to extend hips and block posterior lean. Pt is at high risk for falls. Decreased processing speed with cues and impaired ability to sequence tasks. Will continue to follow acutely. Current recommendations remain appropriate.  Follow Up Recommendations  CIR     Equipment Recommendations  Other (comment) (TBD at next venue)    Recommendations for Other Services Rehab consult     Precautions / Restrictions Precautions Precautions: Fall Precaution Comments: seizure precautions; continuous EEG; R sided inattention Restrictions Weight Bearing Restrictions: No Other Position/Activity Restrictions: has recently required walker and has sinking down episodes in standing per husband    Mobility  Bed Mobility Overal bed mobility: Needs Assistance Bed Mobility: Supine to Sit     Supine to sit: Mod assist;HOB elevated     General bed mobility comments: Cued pt to bring legs off EOB, with pt able to initiate moving L leg off bed  but required repeated tactile and VCs to manage R leg. Cues for hand placement on rail to assist with pulling trunk to ascension, modA to complete.  Transfers Overall transfer level: Needs assistance Equipment used: Rolling walker (2 wheeled) Transfers: Sit to/from [facility] Sit to Stand: Min assist;+2 physical assistance;Mod assist;+2 safety/equipment Stand pivot transfers: Mod assist;Min assist;+2 physical assistance;+2 safety/equipment       General transfer comment: Performed sit to stand 1x from EOB and 2x from recliner to RW, cuing for proper hand placement of L on current sitting usrface and R on RW to start, poor carryover. ModAx1 with minAx1 and extra time to power up to stand each rep and then to manage RW and provide stability and cues to legs for stand step transfer towards L EOB > recliner.  Ambulation/Gait Ambulation/Gait assistance: Min assist;+2 physical assistance;Mod assist;+2 safety/equipment Gait Distance (Feet): 2 Feet Assistive device: Rolling walker (2 wheeled) Gait Pattern/deviations: Step-to pattern;Decreased stride length;Shuffle;Trunk flexed Gait velocity: decreased Gait velocity interpretation: <1.31 ft/sec, indicative of household ambulator General Gait Details: Several small shuffling steps towards L to transfer to recliner, required assist to weight shift and avdance R leg for steps and modAx1 with minAx1 to steady, manage lines, and manage RW; limited distance due to EEG lines and pt's fatigue   Stairs             Wheelchair Mobility    Modified Rankin (Stroke Patients Only)       Balance Overall balance assessment: Needs assistance Sitting-balance support: Feet supported;Bilateral upper extremity supported Sitting balance-Leahy Scale: Poor Sitting balance - Comments: Static sitting EOB with  min guard for safety.   Standing balance support: Bilateral upper extremity supported Standing balance-Leahy Scale: Poor Standing  balance comment: Pt requiring bil UE support and mod A to extend hips and dec posterior lean for balance.  Stood statically 2 x for ~1-2 min each rep.                            Cognition Arousal/Alertness: Awake/alert Behavior During Therapy: Flat affect Overall Cognitive Status: Impaired/Different from baseline Area of Impairment: Orientation;Attention;Memory;Following commands;Safety/judgement;Awareness;Problem solving                 Orientation Level: Time;Situation;Disoriented to Current Attention Level: Sustained Memory: Decreased recall of precautions;Decreased short-term memory Following Commands: Follows one step commands inconsistently;Follows one step commands with increased time Safety/Judgement: Decreased awareness of safety;Decreased awareness of deficits Awareness: Intellectual Problem Solving: Slow processing;Requires tactile cues;Decreased initiation;Difficulty sequencing;Requires verbal cues General Comments: Pt with inconsitent ability to answer questions and following commands. R sided inattention, despite max multi-modal cues to acknowledge R hand placement. Increased time to process all simple cues. Pt sitting in stool upon arrival with no noted acknowledgement.      Exercises      General Comments        Pertinent Vitals/Pain Pain Assessment: Faces Faces Pain Scale: Hurts a little bit Pain Location: Generalized with standing Pain Intervention(s): Limited activity within patient's tolerance;Monitored during session;Repositioned    Home Living                      Prior Function            PT Goals (current goals can now be found in the care plan section) Acute Rehab PT Goals Patient Stated Goal: Pt did not explicitly state PT Goal Formulation: With patient Time For Goal Achievement: 01/22/20 Potential to Achieve Goals: Good Progress towards PT goals: Progressing toward goals    Frequency    Min 4X/week      PT  Plan Current plan remains appropriate    Co-evaluation              AM-PAC PT "6 Clicks" Mobility   Outcome Measure  Help needed turning from your back to your side while in a flat bed without using bedrails?: A Little Help needed moving from lying on your back to sitting on the side of a flat bed without using bedrails?: A Lot Help needed moving to and from a bed to a chair (including a wheelchair)?: A Lot Help needed standing up from a chair using your arms (e.g., wheelchair or bedside chair)?: A Lot Help needed to walk in hospital room?: A Lot Help needed climbing 3-5 steps with a railing? : Total 6 Click Score: 12    End of Session Equipment Utilized During Treatment: Gait belt Activity Tolerance: Patient tolerated treatment well;Patient limited by fatigue Patient left: with call bell/phone within reach;in chair;with chair alarm set Nurse Communication: Mobility status;Other (comment) (need for +2 and RW with transfers towards L being easiest/safest at this time) PT Visit Diagnosis: Muscle weakness (generalized) (M62.81);Other abnormalities of gait and mobility (R26.89);Difficulty in walking, not elsewhere classified (R26.2);Unsteadiness on feet (R26.81)     Time: 2951-8841 PT Time Calculation (min) (ACUTE ONLY): 32 min  Charges:  $Therapeutic Activity: 8-22 mins $Neuromuscular Re-education: 8-22 mins                     Raymond Gurney, PT, DPT Acute  Rehabilitation Services  Pager: [phone] Office: [place] 01/18/2020, 1:13 PM
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
## 2020-01-18 NOTE — Progress Notes (Addendum)
Neurology Progress Note   S:// Patient is seen and examined today.  Not able to tell her name, age, where she is, date, month, year.  Not able to follow most commands.  O:// Current vital signs: BP (!) 143/89 (BP Location: Right Arm)   Pulse 68   Temp 97.6 F (36.4 C)   Resp 14   Ht 5\' 2"  (1.575 m)   Wt 76 kg   SpO2 97%   BMI 30.65 kg/m  Vital signs in last 24 hours: Temp:  [97.6 F (36.4 C)-98.9 F (37.2 C)] 97.6 F (36.4 C) (01/14 2440) Pulse Rate:  [68-98] 68 (01/14 0613) Resp:  [14-20] 14 (01/14 1027) BP: (131-160)/(82-90) 143/89 (01/14 0613) SpO2:  [95 %-98 %] 97 % (01/14 2536) GENERAL: Awake, alert in NAD, not oriented to place, person, time. HEENT: - Normocephalic and atraumatic LUNGS - Symmetrical chest rise, No labored breathing noted CV - no JVD, No Peripheral Edema ABDOMEN - Soft,  nondistended  Ext: warm, well perfused, No Peripheral edema NEURO:  Mental Status: AA, Not oriented to self, age, place, why Pt  is here.  Language: Pt is mute, didn't speak anything.  Pt is not able to name simple objects. Pt is able to follow some commands only. Cranial Nerves: PERRL, not able to track finger, no facial asymmetry, not able to tell facial sensation, hearing intact,  Not able to move Rt sternocleidomastoid and trapezius muscle, shoulder shrug normal on Lt side. No evidence of tongue atrophy or fibrillations. Motor: Patient is not able to move her right hand against gravity but able to squeeze my fingers.  Able to lift left leg against gravity but not able to move or lift right leg. Able to wiggle toes only.   Strength 1/5 in Rt UE, 4/5 in Lt UE, Strength in Rt LE  1/5, Lt LE 4/5 Reflexes- UE -2+  , LE-  2+, Planters- Down going  Tone: is normal and bulk is normal Sensation-difficult to assess as patient not able to follow commands Coordination: Not able to follow commands. Gait- deferred  Medications  Current Facility-Administered Medications:  .  acetaminophen  (TYLENOL) tablet 650 mg, 650 mg, Oral, Q6H PRN **OR** acetaminophen (TYLENOL) suppository 650 mg, 650 mg, Rectal, Q6H PRN, Doutova, Anastassia, MD .  dexamethasone (DECADRON) injection 4 mg, 4 mg, Intravenous, Q12H, Doutova, Anastassia, MD, 4 mg at 01/18/20 1046 .  ezetimibe (ZETIA) tablet 10 mg, 10 mg, Oral, Daily, Doutova, Anastassia, MD, 10 mg at 01/18/20 1046 .  feeding supplement (ENSURE ENLIVE / ENSURE PLUS) liquid 237 mL, 237 mL, Oral, BID BM, Pahwani, Ravi, MD, 237 mL at 01/18/20 1046 .  heparin ADULT infusion 100 units/mL (25000 units/276mL), 1,100 Units/hr, Intravenous, Continuous, Dang, Thuy D, RPH, Last Rate: 11 mL/hr at 01/17/20 1822, 1,100 Units/hr at 01/17/20 1822 .  hydrALAZINE (APRESOLINE) injection 10 mg, 10 mg, Intravenous, Q6H PRN, Darliss Cheney, MD .  levETIRAcetam (KEPPRA) 100 MG/ML solution 1,500 mg, 1,500 mg, Oral, BID, Bhagat, Srishti L, MD, 1,500 mg at 01/18/20 1046 .  LORazepam (ATIVAN) injection 2 mg, 2 mg, Intravenous, Once PRN, Bhagat, Srishti L, MD .  multivitamin with minerals tablet 1 tablet, 1 tablet, Oral, Daily, Pahwani, Ravi, MD, 1 tablet at 01/18/20 1046 .  ondansetron (ZOFRAN) tablet 4 mg, 4 mg, Oral, Q6H PRN **OR** ondansetron (ZOFRAN) injection 4 mg, 4 mg, Intravenous, Q6H PRN, Doutova, Anastassia, MD Labs CBC    Component Value Date/Time   WBC 13.9 (H) 01/18/2020 0328   RBC 4.11 01/18/2020  0328   HGB 12.7 01/18/2020 0328   HCT 40.0 01/18/2020 0328   PLT 305 01/18/2020 0328   MCV 97.3 01/18/2020 0328   MCH 30.9 01/18/2020 0328   MCHC 31.8 01/18/2020 0328   RDW 12.3 01/18/2020 0328   LYMPHSABS 0.7 01/15/2020 0308   MONOABS 0.1 01/15/2020 0308   EOSABS 0.0 01/15/2020 0308   BASOSABS 0.1 01/15/2020 0308    CMP     Component Value Date/Time   NA 143 01/17/2020 0433   NA 141 07/15/2015 0000   NA 141 07/15/2015 0000   K 3.7 01/17/2020 0433   CL 109 01/17/2020 0433   CO2 25 01/17/2020 0433   GLUCOSE 139 (H) 01/17/2020 0433   BUN 22 01/17/2020  0433   BUN 22 (A) 07/15/2015 0000   BUN 22 (A) 07/15/2015 0000   CREATININE 0.71 01/17/2020 0433   CREATININE 0.84 12/26/2018 1217   CALCIUM 8.7 (L) 01/17/2020 0433   PROT 5.5 (L) 01/17/2020 0433   ALBUMIN 2.3 (L) 01/17/2020 0433   AST 17 01/17/2020 0433   ALT 18 01/17/2020 0433   ALKPHOS 48 01/17/2020 0433   BILITOT 0.4 01/17/2020 0433   GFRNONAA >60 01/17/2020 0433   GFRNONAA >60 12/26/2018 1217   GFRAA >60 12/26/2018 1217    glycosylated hemoglobin  Lipid Panel     Component Value Date/Time   CHOL 261 (H) 10/23/2019 0318   TRIG 115 10/23/2019 0318   HDL 56 10/23/2019 0318   CHOLHDL 4.7 10/23/2019 0318   VLDL 23 10/23/2019 0318   LDLCALC 182 (H) 10/23/2019 0318   LDLDIRECT 157.0 03/15/2019 1338     Imaging I have reviewed images in epic and the results pertinent to this consultation are:  CT-scan of the brain Postsurgical changes from bilateral frontal craniotomy for extra-axial tumor resection. Persistent large surgical cavity with thick nodular margins with the fluid portion measuring up to 5.0 cm, similar to prior MRI. Stable appearance of prominent edema within the white matter of the bilateral frontal lobes, including corpus callosum. 2. Unchanged appearance of extra-axial lesion centered in the left sphenoid wing/cavernous sinus.  MRI examination of the brain 1. Postop resection of bifrontal meningioma. Large surgical cavity is present in the left frontal lobe. There is extensive nodular enhancement around the wall of the cavity compatible with the current meningioma. This has grown rapidly since the prior MRI 11/10/2019. Mild progression of bifrontal white matter edema. 2. Bulky left cavernous carotid meningioma stable.  Assessment: Patient is 75 year old female with past medical history of left frontal meningioma and cavernous sinus meningioma s/p resection presented with altered mental status, concerns for seizures.MRI revealed recurrence of left  frontal meningioma and bifrontal edema which has grown rapidly since the prior MRI 11/10/2019.Her symptoms are likely due to worsening of left frontal meningioma. Patient is on Decadron 4 mg IV twice daily and Keppra was increased to 1500 mg twice daily.LTM EEG showed evidence of epileptogenicity as well as cortical dysfunction arising from bifrontal (left >right) region likely secondary to underlying structural abnormality as well as craniotomy. Patient's event recorded at [facility] on 01/16/2020 during which patient was noted to be less responsive.  Concomitant EEG continue to show left >right sharp waves which appeared rhythmic without definite evolution.  Focal seizures may not be seen on scalp EEG.  Pt's BP in last 24 hrs systolic of [id number] and diastolic of [id number] mm HG. WBC 12.1, Urinalysis shows 11-20 wbc's and rare bacteria. Urine culture pending.There was concern at night that Pt was  not swallowing the whole Keppra pill and end up chewing it so Keppra was changed to liquid form.  Impression: Meningioma Likely recurrence of left frontal meningioma Underlying seizure disorder  Recommendations: -Continue Keppra 1500 mg twice daily PO (on liquid form now) -Continue Decadron 4 mg IV twice daily -Continue telemetry -Continue OT/PT -Stop LTM EEG.  -Seizure precautions.   Dr. Armando Reichert MD PGY1 [facility] Neurology   Attending addendum Patient seen and examined Also discussed with Dr. Mickeal Skinner who has very kindly seen the patient again in follow-up. Decisions have been made to progress to comfort measures only, which I think is the right step. Inpatient neurology service will be available as needed. Sincerely appreciate Dr. Renda Rolls as well as palliative care team contribution to care of this patient.  -- Amie Portland, MD Neurologist [facility] Pager: [phone]

## 2020-01-18 NOTE — Progress Notes (Signed)
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 Family denies slurred speech, but she has trouble finding words for sentences. The patient had a MRI done 3 days ago that showed recurrence of left frontal meningioma at the resection site.   Pt unable to provide her own hx. She is undergoing EEG as I enter her bay. It has since been read and it demonstrated evidence of epileptogenicity as well as cortical dysfunction arising from bifrontal lobes (left >right). No seizures were seen during EEG or during hospital stay.  Neurology has recommended continuation of keppra 750 mg bid, decadron 4 mg IV bid, LTM EEG, telemetry monitoring, and seizure precautions.  Dr. Wilford [name] also spoke with Dr. Barbaraann [name], the patient's neurosurgeon. Dr. Barbaraann [name] apparently does not feel that the progression of the meningioma alone can explain the patient's symptoms. Concern is for intermittent seizures. He will see that patient himself.   [facility] were consulted to admit the patient for further evaluation and treatment. Neurology was consulted.   Assessment & Plan:   Active Problems:   Hyperlipidemia   Meningioma (HCC)   Acute metabolic encephalopathy   Acute encephalopathy   Epilepsia (HCC)   SIRS (systemic inflammatory response syndrome) (HCC)   History of pulmonary embolus (PE)   Chronic diastolic CHF (congestive heart failure) (HCC)   Acute encephalopathy/recurrent meningioma/?  Seizures: Mainly secondary to  recurrence of meningioma and seizure activity.  Neurology and neurooncology on board.  Patient is undergoing LTM.  She remains on Keppra as well as dexamethasone.  According to the reports and notes, there was a rapid response called last night due to decreased responsiveness and right facial droop on the patient.  Patient was evaluated by neurology at the bedside as well.  According to the notes, patient symptoms had resolved quickly.  It was opined by neurologist that it was likely secondary to seizure.  No repeat head scan was done.  Patient is slightly more awake today.  She followed the command to lift her left upper extremity and left lower extremity but she seems to have significant weakness in right upper and lower extremity.  No obvious facial droop.  Her Keppra has been increased to 1500 twice daily.  Defer further management to neurology.  She remains on continuous EEG.  Hyperlipidemia: Continue Zetia.  Failure to thrive: Reported poor PO intake in recent weeks.  Dietitian has been consulted since 2 days but I do not see a note.  SIRS: On admission due to elevated WBC and fever. However, this is resolving with hydration. Believe it is likely due to hemoconcentration alone. Fever of 100.4 may have been due to seizure activity. No source of infection has been identified.  She has remained afebrile since then.  History of pulmonary embolus: The patient is on a heparin drip due to perceived risk of bleeding due to hemangioma. Dr. Wilford [name] feels that this is unlikely.   Chronic diastolic CHF: Appears euvolemic.  Elevated blood pressure: Blood pressure labile but fairly controlled.  Continue as needed hydralazine.  DVT prophylaxis: SCDs Start: 01/14/20 2234  Code Status: Full Code  Family Communication:  None present at bedside.    Status is: Inpatient  Remains inpatient appropriate because:Inpatient level of care appropriate due to severity of illness   Dispo: The patient is from:  Home              Anticipated d/c is to: Home              Anticipated d/c date is: 2 days              Patient currently is not medically stable to d/c.        Estimated body mass index is 30.65 kg/m as calculated from the following:   Height as of this encounter: 5\' 2"  (1.575 m).   Weight as of this encounter: 76 kg.      Nutritional status:  Nutrition Problem: Increased nutrient needs Etiology: acute illness   Signs/Symptoms: estimated needs   Interventions: Ensure Enlive (each supplement provides 350kcal and 20 grams of protein)    Consultants:   Neuro-oncology  Neurology  Procedures:   None  Antimicrobials:  Anti-infectives (From admission, onward)   None         Subjective: Patient seen and examined.  Once again she is alert but slow in response and likely disoriented.  Was also repeating some words.  Objective: Vitals:   01/17/20 1355 01/17/20 1400 01/17/20 2127 01/18/20 0613  BP: 138/86 131/82 (!) 160/90 (!) 143/89  Pulse: 96 98 71 68  Resp: 20  14 14   Temp: 98.9 F (37.2 C)  97.8 F (36.6 C) 97.6 F (36.4 C)  TempSrc:      SpO2: 95%  98% 97%  Weight:      Height:        Intake/Output Summary (Last 24 hours) at 01/18/2020 1056 Last data filed at 01/18/2020 0950 Gross per 24 hour  Intake 562.07 ml  Output 1000 ml  Net -437.93 ml   Filed Weights   01/14/20 2200 01/16/20 0030  Weight: 76 kg 76 kg    Examination:  General exam: Appears calm and comfortable  Respiratory system: Clear to auscultation. Respiratory effort normal. Cardiovascular system: S1 & S2 heard, RRR. No JVD, murmurs, rubs, gallops or clicks. No pedal edema. Gastrointestinal system: Abdomen is nondistended, soft and nontender. No organomegaly or masses felt. Normal bowel sounds heard. Central nervous system: Alert but disoriented.  Decreased strength in right upper and lower extremity.     Data Reviewed: I have personally reviewed following labs and imaging  studies  CBC: Recent Labs  Lab 01/14/20 1735 01/14/20 2218 01/15/20 0308 01/16/20 0222 01/17/20 0433 01/18/20 0328  WBC 11.9*  --  11.6* 16.1* 12.1* 13.9*  NEUTROABS 8.9*  --  10.7*  --   --   --   HGB 13.6 10.5* 13.1 12.3 10.8* 12.7  HCT 43.9 31.0* 40.8 39.1 34.4* 40.0  MCV 100.0  --  100.5* 97.8 98.6 97.3  PLT 403*  --  379 370 344 341   Basic Metabolic Panel: Recent Labs  Lab 01/14/20 1735 01/14/20 2218 01/15/20 0308 01/16/20 0605 01/17/20 0433  NA 144 147* 144 145 143  K 4.1 3.1* 3.6 3.6 3.7  CL 103  --  106 108 109  CO2 26  --  21* 26 25  GLUCOSE 89  --  126* 124* 139*  BUN 15  --  16 18 22   CREATININE 0.85  --  0.80 0.75 0.71  CALCIUM 9.4  --  9.2  9.1 8.7*  MG 2.3  --  2.2  --   --   PHOS 3.9  --  4.4  --   --    GFR: Estimated Creatinine Clearance: 60.7 mL/min (by C-G formula based on SCr of 0.71 mg/dL). Liver Function Tests: Recent Labs  Lab 01/14/20 1735 01/15/20 0308 01/17/20 0433  AST 30 18 17   ALT 18 17 18   ALKPHOS 66 62 48  BILITOT 1.0 1.1 0.4  PROT 6.9 6.6 5.5*  ALBUMIN 3.1* 2.9* 2.3*   No results for input(s): LIPASE, AMYLASE in the last 168 hours. Recent Labs  Lab 01/14/20 1758  AMMONIA 51*   Coagulation Profile: Recent Labs  Lab 01/14/20 1735  INR 1.5*   Cardiac Enzymes: Recent Labs  Lab 01/14/20 1735  CKTOTAL 72   BNP (last 3 results) Recent Labs    11/19/19 1512  PROBNP 96.0   HbA1C: No results for input(s): HGBA1C in the last 72 hours. CBG: No results for input(s): GLUCAP in the last 168 hours. Lipid Profile: No results for input(s): CHOL, HDL, LDLCALC, TRIG, CHOLHDL, LDLDIRECT in the last 72 hours. Thyroid Function Tests: No results for input(s): TSH, T4TOTAL, FREET4, T3FREE, THYROIDAB in the last 72 hours. Anemia Panel: No results for input(s): VITAMINB12, FOLATE, FERRITIN, TIBC, IRON, RETICCTPCT in the last 72 hours. Sepsis Labs: Recent Labs  Lab 01/14/20 1735 01/14/20 1758  PROCALCITON <0.10  --    LATICACIDVEN  --  1.1    Recent Results (from the past 240 hour(s))  Blood culture (routine x 2)     Status: Abnormal   Collection Time: 01/14/20  6:29 PM   Specimen: BLOOD  Result Value Ref Range Status   Specimen Description BLOOD SITE NOT SPECIFIED  Final   Special Requests   Final    BOTTLES DRAWN AEROBIC AND ANAEROBIC Blood Culture adequate volume   Culture  Setup Time   Final    GRAM POSITIVE COCCI IN CLUSTERS AEROBIC BOTTLE ONLY CRITICAL RESULT CALLED TO, READ BACK BY AND VERIFIED WITH: Andres Shad PharmD 8:55 01/17/20 ([email or web address])    Culture (A)  Final    STAPHYLOCOCCUS CAPITIS THE SIGNIFICANCE OF ISOLATING THIS ORGANISM FROM A SINGLE SET OF BLOOD CULTURES WHEN MULTIPLE SETS ARE DRAWN IS UNCERTAIN. PLEASE NOTIFY THE MICROBIOLOGY DEPARTMENT WITHIN ONE WEEK IF SPECIATION AND SENSITIVITIES ARE REQUIRED.    Report Status 01/18/2020 FINAL  Final  Blood Culture ID Panel (Reflexed)     Status: Abnormal   Collection Time: 01/14/20  6:29 PM  Result Value Ref Range Status   Enterococcus faecalis NOT DETECTED NOT DETECTED Final   Enterococcus Faecium NOT DETECTED NOT DETECTED Final   Listeria monocytogenes NOT DETECTED NOT DETECTED Final   Staphylococcus species DETECTED (A) NOT DETECTED Final    Comment: CRITICAL RESULT CALLED TO, READ BACK BY AND VERIFIED WITH: Andres Shad PharmD 8:55 01/17/20 ([email or web address])    Staphylococcus aureus (BCID) NOT DETECTED NOT DETECTED Final   Staphylococcus epidermidis NOT DETECTED NOT DETECTED Final   Staphylococcus lugdunensis NOT DETECTED NOT DETECTED Final   Streptococcus species NOT DETECTED NOT DETECTED Final   Streptococcus agalactiae NOT DETECTED NOT DETECTED Final   Streptococcus pneumoniae NOT DETECTED NOT DETECTED Final   Streptococcus pyogenes NOT DETECTED NOT DETECTED Final   A.calcoaceticus-baumannii NOT DETECTED NOT DETECTED Final   Bacteroides fragilis NOT DETECTED NOT DETECTED Final   Enterobacterales NOT DETECTED NOT DETECTED Final    Enterobacter cloacae complex NOT DETECTED NOT DETECTED Final   Escherichia coli NOT DETECTED NOT  DETECTED Final   Klebsiella aerogenes NOT DETECTED NOT DETECTED Final   Klebsiella oxytoca NOT DETECTED NOT DETECTED Final   Klebsiella pneumoniae NOT DETECTED NOT DETECTED Final   Proteus species NOT DETECTED NOT DETECTED Final   Salmonella species NOT DETECTED NOT DETECTED Final   Serratia marcescens NOT DETECTED NOT DETECTED Final   Haemophilus influenzae NOT DETECTED NOT DETECTED Final   Neisseria meningitidis NOT DETECTED NOT DETECTED Final   Pseudomonas aeruginosa NOT DETECTED NOT DETECTED Final   Stenotrophomonas maltophilia NOT DETECTED NOT DETECTED Final   Candida albicans NOT DETECTED NOT DETECTED Final   Candida auris NOT DETECTED NOT DETECTED Final   Candida glabrata NOT DETECTED NOT DETECTED Final   Candida krusei NOT DETECTED NOT DETECTED Final   Candida parapsilosis NOT DETECTED NOT DETECTED Final   Candida tropicalis NOT DETECTED NOT DETECTED Final   Cryptococcus neoformans/gattii NOT DETECTED NOT DETECTED Final    Comment: Performed at [facility], [place] [address] [place], [place] [address]  Urine culture     Status: None   Collection Time: 01/14/20  7:34 PM   Specimen: Urine, Clean Catch  Result Value Ref Range Status   Specimen Description URINE, CLEAN CATCH  Final   Special Requests NONE  Final   Culture   Final    NO GROWTH Performed at [facility], [place] [address] [place], [place] [address]    Report Status 01/16/2020 FINAL  Final  Resp Panel by RT-PCR (Flu A&B, Covid) Nasopharyngeal Swab     Status: None   Collection Time: 01/14/20  8:11 PM   Specimen: Nasopharyngeal Swab; Nasopharyngeal(NP) swabs in vial transport medium  Result Value Ref Range Status   SARS Coronavirus 2 by RT PCR NEGATIVE NEGATIVE Final    Comment: (NOTE) SARS-CoV-2 target nucleic acids are NOT DETECTED.  The SARS-CoV-2 RNA is generally detectable in upper  respiratory specimens during the acute phase of infection. The lowest concentration of SARS-CoV-2 viral copies this assay can detect is 138 copies/mL. A negative result does not preclude SARS-Cov-2 infection and should not be used as the sole basis for treatment or other patient management decisions. A negative result may occur with  improper specimen collection/handling, submission of specimen other than nasopharyngeal swab, presence of viral mutation(s) within the areas targeted by this assay, and inadequate number of viral copies(<138 copies/mL). A negative result must be combined with clinical observations, patient history, and epidemiological information. The expected result is Negative.  Fact Sheet for Patients:  EntrepreneurPulse.com.au  Fact Sheet for Healthcare Providers:  IncredibleEmployment.be  This test is no t yet approved or cleared by the [place] FDA and  has been authorized for detection and/or diagnosis of SARS-CoV-2 by FDA under an Emergency Use Authorization (EUA). This EUA will remain  in effect (meaning this test can be used) for the duration of the COVID-19 declaration under Section 564(b)(1) of the Act, 21 U.S.C.section 360bbb-3(b)(1), unless the authorization is terminated  or revoked sooner.       Influenza A by PCR NEGATIVE NEGATIVE Final   Influenza B by PCR NEGATIVE NEGATIVE Final    Comment: (NOTE) The Xpert Xpress SARS-CoV-2/FLU/RSV plus assay is intended as an aid in the diagnosis of influenza from Nasopharyngeal swab specimens and should not be used as a sole basis for treatment. Nasal washings and aspirates are unacceptable for Xpert Xpress SARS-CoV-2/FLU/RSV testing.  Fact Sheet for Patients: EntrepreneurPulse.com.au  Fact Sheet for Healthcare Providers: IncredibleEmployment.be  This test is not yet approved  or cleared by the [place] and has been  authorized for detection and/or diagnosis of SARS-CoV-2 by FDA under an Emergency Use Authorization (EUA). This EUA will remain in effect (meaning this test can be used) for the duration of the COVID-19 declaration under Section 564(b)(1) of the Act, 21 U.S.C. section 360bbb-3(b)(1), unless the authorization is terminated or revoked.  Performed at [facility], [place] [address] [place], [place] [address]       Radiology Studies: DG CHEST PORT 1 VIEW  Result Date: 01/16/2020 CLINICAL DATA:  75 year old female with altered mental status. EXAM: PORTABLE CHEST 1 VIEW COMPARISON:  Chest radiograph dated 01/14/2020. FINDINGS: No focal consolidation, pleural effusion, or pneumothorax. Stable cardiomediastinal silhouette. No acute osseous pathology. IMPRESSION: No active disease.  No interval change. Electronically Signed   By: Anner Crete M.D.   On: 01/16/2020 20:59    Scheduled Meds: . dexamethasone (DECADRON) injection  4 mg Intravenous Q12H  . ezetimibe  10 mg Oral Daily  . feeding supplement  237 mL Oral BID BM  . levETIRAcetam  1,500 mg Oral BID  . multivitamin with minerals  1 tablet Oral Daily   Continuous Infusions: . heparin 1,100 Units/hr (01/17/20 1822)     LOS: 4 days   Time spent: 28 minutes   Darliss Cheney, MD [facility]  01/18/2020, 10:56 AM   To contact the attending provider between 7A-7P or the covering provider during after hours 7P-7A, please log into the web site [email or web address].

## 2020-01-18 NOTE — Progress Notes (Signed)
[place] for Heparin Indication:  Recent PE  Allergies  Allergen Reactions  . Bee Venom Swelling and Other (See Comments)    Site of swelling not recalled by family- happened during childhood  . Losartan Itching    Patient Measurements: Height: 5\' 2"  (157.5 cm) Weight: 76 kg (167 lb 8.8 oz) IBW/kg (Calculated) : 50.1 Heparin Dosing Weight: 66.6 kg  Vital Signs: Temp: 97.6 F (36.4 C) (01/14 0613) BP: 143/89 (01/14 0613) Pulse Rate: 68 (01/14 0613)  Labs: Recent Labs    01/16/20 0222 01/16/20 0605 01/16/20 1731 01/17/20 0433 01/17/20 1206 01/18/20 0328  HGB 12.3  --   --  10.8*  --  12.7  HCT 39.1  --   --  34.4*  --  40.0  PLT 370  --   --  344  --  305  APTT 61* 63*   < > 81* 95* 100*  HEPARINUNFRC 1.28*  --   --  1.04* 0.95* 1.05*  CREATININE  --  0.75  --  0.71  --   --    < > = values in this interval not displayed.    Estimated Creatinine Clearance: 60.7 mL/min (by C-G formula based on SCr of 0.71 mg/dL).  Assessment: 75 yr old female admitted for AMS and concerns for seizure, likely 2/2 expanding left frontal meningioma. Patient was on apixaban PTA for a recent PE in November (last dose apixaban unknown). Pharmacy has been asked to assist with transition to heparin 2/2 AMS and possible neuro interventions.  APTT is therapeutic.  Heparin level is still elevated due to the effect of Eliquis.  No bleeding reported.  Goal of Therapy:  aPTT 66-102 sec Heparin level 0.3-0.7 units/mL Monitor platelets by anticoagulation protocol: Yes   Plan:  Cont heparin gtt 1100 units/hr Daily heparin level, aPTT and CBC  Onnie Boer, PharmD, BCIDP, AAHIVP, CPP Infectious Disease Pharmacist 01/18/2020 8:45 AM

## 2020-01-19 DIAGNOSIS — Z515 Encounter for palliative care: Secondary | ICD-10-CM

## 2020-01-19 DIAGNOSIS — I5032 Chronic diastolic (congestive) heart failure: Secondary | ICD-10-CM | POA: Diagnosis not present

## 2020-01-19 DIAGNOSIS — D329 Benign neoplasm of meninges, unspecified: Secondary | ICD-10-CM | POA: Diagnosis not present

## 2020-01-19 DIAGNOSIS — Z86711 Personal history of pulmonary embolism: Secondary | ICD-10-CM | POA: Diagnosis not present

## 2020-01-19 NOTE — Progress Notes (Signed)
Daily Progress Note   Patient Name: [name] [name]       Date: 01/19/2020 DOB: Jul 16, 1945  Age: 75 y.o. MRN#: [id number] Attending Physician: Barb Merino, MD Primary Care Physician: Darreld Mclean, MD Admit Date: 01/14/2020  Reason for Follow-up: terminal care, symptom management  Subjective: [name] [name] is at bedside feeding patient lunch. She is completely unable to feed herself, but will eat when someone feeds her.  She is unable to verbally respond to any of my questions - mostly stares blankly at the TV.   Discussed with [name] [name] that I ordered scheduled pain medication since she is unable to verbalize if/when she having discomfort - he expresses appreciation and reports she seems more comfortable compared to yesterday.  Length of Stay: 5  Current Medications: Scheduled Meds:  . dexamethasone (DECADRON) injection  4 mg Intravenous Q12H  . levETIRAcetam  1,500 mg Oral BID  . morphine CONCENTRATE  5 mg Sublingual Q6H      PRN Meds: acetaminophen **OR** acetaminophen, antiseptic oral rinse, glycopyrrolate **OR** glycopyrrolate, hydrALAZINE, LORazepam, ondansetron **OR** ondansetron (ZOFRAN) IV, polyvinyl alcohol  Physical Exam Vitals reviewed.  Constitutional:      General: She is not in acute distress.    Appearance: She is ill-appearing.  Pulmonary:     Effort: Pulmonary effort is normal.  Neurological:     Mental Status: She is alert.     Motor: Weakness present.             Vital Signs: BP 100/70 (BP Location: Left Arm)   Pulse 87   Temp 98.4 F (36.9 C) (Oral)   Resp 17   Ht $R'5\' 2"'Ik$  (1.575 m)   Wt 76 kg   SpO2 93%   BMI 30.65 kg/m  SpO2: SpO2: 93 % O2 Device: O2 Device: Room Air O2 Flow Rate:    Intake/output summary:   Intake/Output Summary (Last 24 hours)  at 01/19/2020 1433 Last data filed at 01/19/2020 1400 Gross per 24 hour  Intake 725.43 ml  Output 400 ml  Net 325.43 ml   LBM: Last BM Date: 01/18/20 Baseline Weight: Weight: 76 kg Most recent weight: Weight: 76 kg       Palliative Assessment/Data: PPS 30%       Palliative Care Assessment & Plan   HPI/Patient Profile: 75 y.o. female  with past  medical history of left frontal meningioma and sinus meningioma, PE on Eliquis, chronic diastolic CHF, hypertension, osteroporosis presenting to the emergency department on 01/14/2020 with altered mental status, weakness, and difficulty walking for the past 10 days. MRI revealed recurrence of left frontal meningioma and bifrontal edema which has grown rapidly since the prior MRI 11/10/19. She was admitted to [facility] for acute encephalopathy in the setting of progressive meningioma.  EEG 1/11 showed evidence of epileptogenicity as well as cortical dysfunction arising from bifrontal region likely secondary to underlying structural abnormality as well as craniotomy. No seizures were seen during EEG. Neurology has felt her symptoms are likely due to worsening of left frontal meningioma  Assessment: - acute encephalopathy due to recurrent meningioma - suspected seizure - history of PE - chronic diastolic heart failure - failure to thrive  - end of life care  Recommendations/Plan:  Continue ull comfort measures  DNR/DNI as previously documented  Transfer to hospice home in [facility] when bed becomes available   Continue Morphine 5 mg every 6 hours for comfort  Continue Keppra and Decadron  Provide frequent assessments and administer PRN medications as clinically necessary to ensure EOL comfort  PMT will continue to follow holistically   Goals of Care and Additional Recommendations: Cardiopulmonary Resuscitation: Do Not Attempt Resuscitation (DNR/No CPR)  Medical Interventions: Comfort Measures: Keep clean, warm, and dry. Use medication  by any route, positioning, wound care, and other measures to relieve pain and suffering. Use oxygen, suction and manual treatment of airway obstruction as needed for comfort. Do not transfer to the hospital unless comfort needs cannot be met in current location.  Antibiotics: Determine use of limitation of antibiotics when infection occurs  IV Fluids: IV fluids for a defined trial period  Feeding Tube: No feeding tube    Code Status: DNR/DNI  Prognosis:  Poor in the setting of aggressive recurrent meningioma and recent rapid decline in functional status.   Discharge Planning:  Hospice facility  Care plan was discussed with primary RN  Thank you for allowing the Palliative Medicine Team to assist in the care of this patient.   Total Time 15 minutes Prolonged Time Billed  no      Greater than 50%  of this time was spent counseling and coordinating care related to the above assessment and plan.  Lavena Bullion, NP  Please contact Palliative Medicine Team phone at [phone] for questions and concerns.

## 2020-01-19 NOTE — TOC Initial Note (Signed)
Transition of Care [facility]) - Initial/Assessment Note    Patient Details  Name: [name] [name] MRN: [id number] Date of Birth: Nov 26, 1945  Transition of Care [facility]) CM/SW Contact:    Bartholomew Crews, RN Phone Number: [phone] 01/19/2020, 9:10 AM  Clinical Narrative:                  Acknowledging [facility] consult for residential [place]. Spoke with liaison, Juliann Pulse, at [place]. Referral received late yesterday - work up in process. Provided decision maker and contact information. TOC to advise team when bed approved and available.   Expected Discharge Plan: [place]     Patient Goals and CMS Choice        Expected Discharge Plan and Services Expected Discharge Plan: [place]                                              Prior Living Arrangements/Services                       Activities of Daily Living Home Assistive Devices/Equipment: Cane (specify quad or straight) ADL Screening (condition at time of admission) Patient's cognitive ability adequate to safely complete daily activities?: No Is the patient deaf or have difficulty hearing?: No Does the patient have difficulty seeing, even when wearing glasses/contacts?: No Does the patient have difficulty concentrating, remembering, or making decisions?: Yes Patient able to express need for assistance with ADLs?: Yes Does the patient have difficulty dressing or bathing?: Yes Independently performs ADLs?: No Communication: Independent Grooming: Needs assistance Is this a change from baseline?: Pre-admission baseline Feeding: Needs assistance Is this a change from baseline?: Pre-admission baseline Bathing: Dependent Is this a change from baseline?: Pre-admission baseline Toileting: Dependent Is this a change from baseline?: Pre-admission baseline In/Out Bed: Needs assistance Is this a change from baseline?: Pre-admission baseline Walks  in Home: Needs assistance Is this a change from baseline?: Pre-admission baseline Does the patient have difficulty walking or climbing stairs?: Yes Weakness of Legs: Both Weakness of Arms/Hands: Both  Permission Sought/Granted                  Emotional Assessment              Admission diagnosis:  Acute encephalopathy [G93.40] Altered mental status, unspecified altered mental status type [R41.82] Patient Active Problem List   Diagnosis Date Noted  . Acute encephalopathy 01/14/2020  . Epilepsia ([place]) 01/14/2020  . SIRS (systemic inflammatory response syndrome) ([place]) 01/14/2020  . History of pulmonary embolus (PE) 01/14/2020  . Chronic diastolic CHF (congestive heart failure) ([place]) 01/14/2020  . Focal seizure ([place]) 12/03/2019  . Brain tumor (benign) ([place]) 11/09/2019  . Acute metabolic encephalopathy 24/09/7351  . AMS (altered mental status) 10/22/2019  . Meningioma, cerebral ([place]) 12/22/2018  . Acute pulmonary embolism ([place]) 09/28/2017  . Dyspnea on exertion 09/28/2017  . Hypoxia 09/28/2017  . Meningioma ([place]) 09/07/2017  . FATIGUE 09/12/2009  . MYALGIA 10/31/2008  . Exertional dyspnea 10/31/2008  . Hyperlipidemia 11/11/2006  . OTHER SPECIFIED ANEMIAS 11/11/2006  . GERD 11/11/2006  . Osteoporosis 11/11/2006  . IRRITABLE BOWEL SYNDROME, HX OF 10/20/2006   PCP:  Copland, Gay Filler, MD Pharmacy:   [id number] - [place], [place] - [address] AT [place]  [place] [address] [place] [address] Phone: [phone] Fax: [phone]     Social Determinants of Health (SDOH) Interventions    Readmission Risk Interventions Readmission Risk Prevention Plan 11/13/2019  Post Dischage Appt Complete  Medication Screening Complete  Transportation Screening Complete  Some recent data might be hidden

## 2020-01-19 NOTE — Progress Notes (Signed)
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  Recent MRI showed recurrence of left frontal meningioma at the resection site. She was admitted to the hospital, underwent different investigations to look for any secondary cause for encephalopathy and confusion. Followed by oncology, palliative. 1/14, decision made for comfort care.   Assessment & Plan:   Active Problems:   Hyperlipidemia   Meningioma (HCC)   Acute metabolic encephalopathy   Acute encephalopathy   Epilepsia (HCC)   SIRS (systemic inflammatory response syndrome) (HCC)   History of pulmonary embolus (PE)   Chronic diastolic CHF (congestive heart failure) (HCC)   End of life care  Acute encephalopathy due to recurrent meningioma and suspected seizure: Failure to thrive in adult History of pulmonary embolism Chronic diastolic heart failure End-of-life care  Plan: Full comfort measure DNR/DNI, MOST and DNR signed. Referral to hospice, family wanting [facility] [place] [facility]. All symptom management and end-of-life care orders available, focus on keeping comfort. RN to pronounce death if happens in the hospital. No escalation of care. Continue dexamethasone and Keppra, will help to prevent seizure as long as she can take it.    DVT prophylaxis: Comfort care   Code Status: Comfort care Family Communication: None at bedside Disposition Plan: Status is: Inpatient  Remains inpatient appropriate because:Unsafe d/c plan   Dispo: The patient is from: Home              Anticipated d/c is to: Inpatient hospice              Anticipated d/c  date is: 1 day              Patient currently is medically stable to d/c.  Only to the hospice level of care.         Consultants:   Neuro oncology  Neurology  Palliative  Procedures:   EEG  Antimicrobials:   None   Subjective: Patient seen and examined.  No overnight events.  Remains pleasant and confused.  Able to swallow medications.  Denies any pain nausea or vomiting.  She is not sure why she is in the hospital.  Objective: Vitals:   01/17/20 2127 01/18/20 0613 01/18/20 1412 01/18/20 2153  BP: (!) 160/90 (!) 143/89 109/74 100/70  Pulse: 71 68 88 87  Resp: 14 14 16 17   Temp: 97.8 F (36.6 C) 97.6 F (36.4 C) 98.3 F (36.8 C) 98.4 F (36.9 C)  TempSrc:   Oral Oral  SpO2: 98% 97% 94% 93%  Weight:      Height:        Intake/Output Summary (Last 24 hours) at 01/19/2020 0911 Last data filed at 01/18/2020 1820 Gross per 24 hour  Intake 385.43 ml  Output 400 ml  Net -14.57 ml   Filed Weights   01/14/20 2200 01/16/20 0030  Weight: 76 kg 76 kg    Examination:  General exam: Appears calm and comfortable on room air. Patient is alert but oriented x0.  She looks calm and quiet, slow to respond. Respiratory system: Clear to auscultation. Respiratory effort normal.  No added sounds. Cardiovascular system: S1 & S2 heard, RRR.  Gastrointestinal system: Soft.  Nontender.  Skin: No rashes, lesions or ulcers Psychiatry: Judgement and insight appear compromised.  Affect is flat.  Mood is normal.  Has generalized weakness.    Data Reviewed: I have personally reviewed following labs and imaging studies  CBC: Recent Labs  Lab 01/14/20 1735 01/14/20 2218 01/15/20 0308 01/16/20 0222 01/17/20 0433 01/18/20 0328  WBC 11.9*  --  11.6* 16.1* 12.1* 13.9*  NEUTROABS 8.9*  --  10.7*  --   --   --   HGB 13.6 10.5* 13.1 12.3 10.8* 12.7  HCT 43.9 31.0* 40.8 39.1 34.4* 40.0  MCV 100.0  --  100.5* 97.8 98.6 97.3  PLT 403*  --  379 370 344 956   Basic Metabolic  Panel: Recent Labs  Lab 01/14/20 1735 01/14/20 2218 01/15/20 0308 01/16/20 0605 01/17/20 0433  NA 144 147* 144 145 143  K 4.1 3.1* 3.6 3.6 3.7  CL 103  --  106 108 109  CO2 26  --  21* 26 25  GLUCOSE 89  --  126* 124* 139*  BUN 15  --  16 18 22   CREATININE 0.85  --  0.80 0.75 0.71  CALCIUM 9.4  --  9.2 9.1 8.7*  MG 2.3  --  2.2  --   --   PHOS 3.9  --  4.4  --   --    GFR: Estimated Creatinine Clearance: 60.7 mL/min (by C-G formula based on SCr of 0.71 mg/dL). Liver Function Tests: Recent Labs  Lab 01/14/20 1735 01/15/20 0308 01/17/20 0433  AST 30 18 17   ALT 18 17 18   ALKPHOS 66 62 48  BILITOT 1.0 1.1 0.4  PROT 6.9 6.6 5.5*  ALBUMIN 3.1* 2.9* 2.3*   No results for input(s): LIPASE, AMYLASE in the last 168 hours. Recent Labs  Lab 01/14/20 1758  AMMONIA 51*   Coagulation Profile: Recent Labs  Lab 01/14/20 1735  INR 1.5*   Cardiac Enzymes: Recent Labs  Lab 01/14/20 1735  CKTOTAL 72   BNP (last 3 results) Recent Labs    11/19/19 1512  PROBNP 96.0   HbA1C: No results for input(s): HGBA1C in the last 72 hours. CBG: No results for input(s): GLUCAP in the last 168 hours. Lipid Profile: No results for input(s): CHOL, HDL, LDLCALC, TRIG, CHOLHDL, LDLDIRECT in the last 72 hours. Thyroid Function Tests: No results for input(s): TSH, T4TOTAL, FREET4, T3FREE, THYROIDAB in the last 72 hours. Anemia Panel: No results for input(s): VITAMINB12, FOLATE, FERRITIN, TIBC, IRON, RETICCTPCT in the last 72 hours. Sepsis Labs: Recent Labs  Lab 01/14/20 1735 01/14/20 1758  PROCALCITON <0.10  --   LATICACIDVEN  --  1.1    Recent Results (from the past 240 hour(s))  Blood culture (routine x 2)     Status: Abnormal   Collection Time: 01/14/20  6:29 PM   Specimen: BLOOD  Result Value Ref Range Status   Specimen Description BLOOD SITE NOT SPECIFIED  Final   Special Requests   Final    BOTTLES DRAWN AEROBIC AND ANAEROBIC Blood Culture adequate volume   Culture   Setup Time   Final    GRAM POSITIVE COCCI IN CLUSTERS AEROBIC BOTTLE ONLY CRITICAL RESULT CALLED TO, READ BACK BY AND VERIFIED WITH: Andres Shad PharmD 8:55 01/17/20 ([email or web address])    Culture (A)  Final    STAPHYLOCOCCUS CAPITIS THE SIGNIFICANCE OF ISOLATING THIS ORGANISM FROM A SINGLE SET OF BLOOD CULTURES WHEN MULTIPLE SETS ARE DRAWN IS UNCERTAIN. PLEASE NOTIFY THE MICROBIOLOGY DEPARTMENT WITHIN ONE WEEK IF  SPECIATION AND SENSITIVITIES ARE REQUIRED.    Report Status 01/18/2020 FINAL  Final  Blood Culture ID Panel (Reflexed)     Status: Abnormal   Collection Time: 01/14/20  6:29 PM  Result Value Ref Range Status   Enterococcus faecalis NOT DETECTED NOT DETECTED Final   Enterococcus Faecium NOT DETECTED NOT DETECTED Final   Listeria monocytogenes NOT DETECTED NOT DETECTED Final   Staphylococcus species DETECTED (A) NOT DETECTED Final    Comment: CRITICAL RESULT CALLED TO, READ BACK BY AND VERIFIED WITH: Peter Minium PharmD 8:55 01/17/20 ([email or web address])    Staphylococcus aureus (BCID) NOT DETECTED NOT DETECTED Final   Staphylococcus epidermidis NOT DETECTED NOT DETECTED Final   Staphylococcus lugdunensis NOT DETECTED NOT DETECTED Final   Streptococcus species NOT DETECTED NOT DETECTED Final   Streptococcus agalactiae NOT DETECTED NOT DETECTED Final   Streptococcus pneumoniae NOT DETECTED NOT DETECTED Final   Streptococcus pyogenes NOT DETECTED NOT DETECTED Final   A.calcoaceticus-baumannii NOT DETECTED NOT DETECTED Final   Bacteroides fragilis NOT DETECTED NOT DETECTED Final   Enterobacterales NOT DETECTED NOT DETECTED Final   Enterobacter cloacae complex NOT DETECTED NOT DETECTED Final   Escherichia coli NOT DETECTED NOT DETECTED Final   Klebsiella aerogenes NOT DETECTED NOT DETECTED Final   Klebsiella oxytoca NOT DETECTED NOT DETECTED Final   Klebsiella pneumoniae NOT DETECTED NOT DETECTED Final   Proteus species NOT DETECTED NOT DETECTED Final   Salmonella species NOT DETECTED NOT DETECTED Final    Serratia marcescens NOT DETECTED NOT DETECTED Final   Haemophilus influenzae NOT DETECTED NOT DETECTED Final   Neisseria meningitidis NOT DETECTED NOT DETECTED Final   Pseudomonas aeruginosa NOT DETECTED NOT DETECTED Final   Stenotrophomonas maltophilia NOT DETECTED NOT DETECTED Final   Candida albicans NOT DETECTED NOT DETECTED Final   Candida auris NOT DETECTED NOT DETECTED Final   Candida glabrata NOT DETECTED NOT DETECTED Final   Candida krusei NOT DETECTED NOT DETECTED Final   Candida parapsilosis NOT DETECTED NOT DETECTED Final   Candida tropicalis NOT DETECTED NOT DETECTED Final   Cryptococcus neoformans/gattii NOT DETECTED NOT DETECTED Final    Comment: Performed at [facility], [address], [place], [place] [address]  Urine culture     Status: None   Collection Time: 01/14/20  7:34 PM   Specimen: Urine, Clean Catch  Result Value Ref Range Status   Specimen Description URINE, CLEAN CATCH  Final   Special Requests NONE  Final   Culture   Final    NO GROWTH Performed at [facility], [address] [place], [place] [address]    Report Status 01/16/2020 FINAL  Final  Resp Panel by RT-PCR (Flu A&B, Covid) Nasopharyngeal Swab     Status: None   Collection Time: 01/14/20  8:11 PM   Specimen: Nasopharyngeal Swab; Nasopharyngeal(NP) swabs in vial transport medium  Result Value Ref Range Status   SARS Coronavirus 2 by RT PCR NEGATIVE NEGATIVE Final    Comment: (NOTE) SARS-CoV-2 target nucleic acids are NOT DETECTED.  The SARS-CoV-2 RNA is generally detectable in upper respiratory specimens during the acute phase of infection. The lowest concentration of SARS-CoV-2 viral copies this assay can detect is 138 copies/mL. A negative result does not preclude SARS-Cov-2 infection and should not be used as the sole basis for treatment or other patient management decisions. A negative result may occur with  improper specimen collection/handling, submission of specimen  other than nasopharyngeal swab, presence of viral mutation(s) within the areas targeted by  this assay, and inadequate number of viral copies(<138 copies/mL). A negative result must be combined with clinical observations, patient history, and epidemiological information. The expected result is Negative.  Fact Sheet for Patients:  EntrepreneurPulse.com.au  Fact Sheet for Healthcare Providers:  IncredibleEmployment.be  This test is no t yet approved or cleared by the [place] FDA and  has been authorized for detection and/or diagnosis of SARS-CoV-2 by FDA under an Emergency Use Authorization (EUA). This EUA will remain  in effect (meaning this test can be used) for the duration of the COVID-19 declaration under Section 564(b)(1) of the Act, 21 U.S.C.section 360bbb-3(b)(1), unless the authorization is terminated  or revoked sooner.       Influenza A by PCR NEGATIVE NEGATIVE Final   Influenza B by PCR NEGATIVE NEGATIVE Final    Comment: (NOTE) The Xpert Xpress SARS-CoV-2/FLU/RSV plus assay is intended as an aid in the diagnosis of influenza from Nasopharyngeal swab specimens and should not be used as a sole basis for treatment. Nasal washings and aspirates are unacceptable for Xpert Xpress SARS-CoV-2/FLU/RSV testing.  Fact Sheet for Patients: EntrepreneurPulse.com.au  Fact Sheet for Healthcare Providers: IncredibleEmployment.be  This test is not yet approved or cleared by the [place] FDA and has been authorized for detection and/or diagnosis of SARS-CoV-2 by FDA under an Emergency Use Authorization (EUA). This EUA will remain in effect (meaning this test can be used) for the duration of the COVID-19 declaration under Section 564(b)(1) of the Act, 21 U.S.C. section 360bbb-3(b)(1), unless the authorization is terminated or revoked.  Performed at [facility], [place] [address] [place],  [place] [address]          Radiology Studies: No results found.      Scheduled Meds: . dexamethasone (DECADRON) injection  4 mg Intravenous Q12H  . levETIRAcetam  1,500 mg Oral BID  . morphine CONCENTRATE  5 mg Sublingual Q6H   Continuous Infusions:   LOS: 5 days    Time spent: 30 minutes    Barb Merino, MD [facility] Pager [phone]

## 2020-01-20 DIAGNOSIS — Z515 Encounter for palliative care: Secondary | ICD-10-CM | POA: Diagnosis not present

## 2020-01-20 NOTE — Progress Notes (Signed)
PROGRESS NOTE    [name] [name]  WUJ:[id number] DOB: Oct 23, 1945 DOA: 01/14/2020 PCP: Darreld Mclean, MD    Brief Narrative:  75 year old female with history of left frontal meningioma and cavernous sinus meningioma s/p resection, on steroids and Keppra, hypertension hyperlipidemia, recent history of pulmonary embolism brought to the ER with progressive confusion and weakness, difficulty walking for 10 days.  Recent MRI showed recurrence of left frontal meningioma at the resection site. She was admitted to the hospital, underwent different investigations to look for any secondary cause for encephalopathy and confusion. Followed by oncology, palliative. 1/14, decision made for comfort care.   Assessment & Plan:   Active Problems:   Hyperlipidemia   Meningioma (HCC)   Acute metabolic encephalopathy   Acute encephalopathy   Epilepsia ([place])   SIRS (systemic inflammatory response syndrome) (HCC)   History of pulmonary embolus (PE)   Chronic diastolic CHF (congestive heart failure) (HCC)   End of life care  Acute encephalopathy due to recurrent meningioma and suspected seizure: Failure to thrive in adult History of pulmonary embolism Chronic diastolic heart failure End-of-life care  Plan: Full comfort measure DNR/DNI, MOST and DNR signed. Referral to hospice, family wanting [facility] [place] [facility]. All symptom management and end-of-life care orders available, focus on keeping comfort. RN to pronounce death if happens in the hospital. No escalation of care. Continue dexamethasone and Keppra, will help to prevent seizure as long as she can take it.    DVT prophylaxis: Comfort care   Code Status: Comfort care Family Communication: None at bedside Disposition Plan: Status is: Inpatient  Remains inpatient appropriate because:Unsafe d/c plan   Dispo: The patient is from: Home              Anticipated d/c is to: Inpatient hospice              Anticipated d/c  date is: 1 day              Patient currently is medically stable to d/c.  Only to the hospice level of care.         Consultants:   Neuro oncology  Neurology  Palliative  Procedures:   EEG  Antimicrobials:   None   Subjective: Patient seen and examined.  No overnight events.  Remains quiet and calm.  Denies any pain.  Pleasantly confused.  Objective: Vitals:   01/18/20 1412 01/18/20 2153 01/19/20 1618 01/19/20 2058  BP: 109/74 100/70 115/73 125/82  Pulse: 88 87 77 85  Resp: 16 17 14 16   Temp: 98.3 F (36.8 C) 98.4 F (36.9 C) 98.1 F (36.7 C) 99 F (37.2 C)  TempSrc: Oral Oral Oral Oral  SpO2: 94% 93% 95% 96%  Weight:      Height:        Intake/Output Summary (Last 24 hours) at 01/20/2020 1012 Last data filed at 01/19/2020 2353 Gross per 24 hour  Intake 480 ml  Output 600 ml  Net -120 ml   Filed Weights   01/14/20 2200 01/16/20 0030  Weight: 76 kg 76 kg    Examination:  General exam: Appears calm and comfortable on room air. Patient is alert but oriented x0.  She looks calm and quiet, slow to respond. Respiratory system: Clear to auscultation. Respiratory effort normal.  No added sounds. Cardiovascular system: S1 & S2 heard, RRR.  Gastrointestinal system: Soft.  Nontender. Skin: No rashes, lesions or ulcers Psychiatry: Judgement and insight appear compromised.  Affect is flat.  Mood  is normal.  Has generalized weakness.    Data Reviewed: I have personally reviewed following labs and imaging studies  CBC: Recent Labs  Lab 01/14/20 1735 01/14/20 2218 01/15/20 0308 01/16/20 0222 01/17/20 0433 01/18/20 0328  WBC 11.9*  --  11.6* 16.1* 12.1* 13.9*  NEUTROABS 8.9*  --  10.7*  --   --   --   HGB 13.6 10.5* 13.1 12.3 10.8* 12.7  HCT 43.9 31.0* 40.8 39.1 34.4* 40.0  MCV 100.0  --  100.5* 97.8 98.6 97.3  PLT 403*  --  379 370 344 [id number]   Basic Metabolic Panel: Recent Labs  Lab 01/14/20 1735 01/14/20 2218 01/15/20 0308 01/16/20 0605  01/17/20 0433  NA 144 147* 144 145 143  K 4.1 3.1* 3.6 3.6 3.7  CL 103  --  106 108 109  CO2 26  --  21* 26 25  GLUCOSE 89  --  126* 124* 139*  BUN 15  --  16 18 22   CREATININE 0.85  --  0.80 0.75 0.71  CALCIUM 9.4  --  9.2 9.1 8.7*  MG 2.3  --  2.2  --   --   PHOS 3.9  --  4.4  --   --    GFR: Estimated Creatinine Clearance: 60.7 mL/min (by C-G formula based on SCr of 0.71 mg/dL). Liver Function Tests: Recent Labs  Lab 01/14/20 1735 01/15/20 0308 01/17/20 0433  AST 30 18 17   ALT 18 17 18   ALKPHOS 66 62 48  BILITOT 1.0 1.1 0.4  PROT 6.9 6.6 5.5*  ALBUMIN 3.1* 2.9* 2.3*   No results for input(s): LIPASE, AMYLASE in the last 168 hours. Recent Labs  Lab 01/14/20 1758  AMMONIA 51*   Coagulation Profile: Recent Labs  Lab 01/14/20 1735  INR 1.5*   Cardiac Enzymes: Recent Labs  Lab 01/14/20 1735  CKTOTAL 72   BNP (last 3 results) Recent Labs    11/19/19 1512  PROBNP 96.0   HbA1C: No results for input(s): HGBA1C in the last 72 hours. CBG: No results for input(s): GLUCAP in the last 168 hours. Lipid Profile: No results for input(s): CHOL, HDL, LDLCALC, TRIG, CHOLHDL, LDLDIRECT in the last 72 hours. Thyroid Function Tests: No results for input(s): TSH, T4TOTAL, FREET4, T3FREE, THYROIDAB in the last 72 hours. Anemia Panel: No results for input(s): VITAMINB12, FOLATE, FERRITIN, TIBC, IRON, RETICCTPCT in the last 72 hours. Sepsis Labs: Recent Labs  Lab 01/14/20 1735 01/14/20 1758  PROCALCITON <0.10  --   LATICACIDVEN  --  1.1    Recent Results (from the past 240 hour(s))  Blood culture (routine x 2)     Status: Abnormal   Collection Time: 01/14/20  6:29 PM   Specimen: BLOOD  Result Value Ref Range Status   Specimen Description BLOOD SITE NOT SPECIFIED  Final   Special Requests   Final    BOTTLES DRAWN AEROBIC AND ANAEROBIC Blood Culture adequate volume   Culture  Setup Time   Final    GRAM POSITIVE COCCI IN CLUSTERS AEROBIC BOTTLE ONLY CRITICAL  RESULT CALLED TO, READ BACK BY AND VERIFIED WITH: Andres Shad PharmD 8:55 01/17/20 ([email or web address])    Culture (A)  Final    STAPHYLOCOCCUS CAPITIS THE SIGNIFICANCE OF ISOLATING THIS ORGANISM FROM A SINGLE SET OF BLOOD CULTURES WHEN MULTIPLE SETS ARE DRAWN IS UNCERTAIN. PLEASE NOTIFY THE MICROBIOLOGY DEPARTMENT WITHIN ONE WEEK IF SPECIATION AND SENSITIVITIES ARE REQUIRED.    Report Status 01/18/2020 FINAL  Final  Blood Culture ID  Panel (Reflexed)     Status: Abnormal   Collection Time: 01/14/20  6:29 PM  Result Value Ref Range Status   Enterococcus faecalis NOT DETECTED NOT DETECTED Final   Enterococcus Faecium NOT DETECTED NOT DETECTED Final   Listeria monocytogenes NOT DETECTED NOT DETECTED Final   Staphylococcus species DETECTED (A) NOT DETECTED Final    Comment: CRITICAL RESULT CALLED TO, READ BACK BY AND VERIFIED WITH: Andres Shad PharmD 8:55 01/17/20 ([email or web address])    Staphylococcus aureus (BCID) NOT DETECTED NOT DETECTED Final   Staphylococcus epidermidis NOT DETECTED NOT DETECTED Final   Staphylococcus lugdunensis NOT DETECTED NOT DETECTED Final   Streptococcus species NOT DETECTED NOT DETECTED Final   Streptococcus agalactiae NOT DETECTED NOT DETECTED Final   Streptococcus pneumoniae NOT DETECTED NOT DETECTED Final   Streptococcus pyogenes NOT DETECTED NOT DETECTED Final   A.calcoaceticus-baumannii NOT DETECTED NOT DETECTED Final   Bacteroides fragilis NOT DETECTED NOT DETECTED Final   Enterobacterales NOT DETECTED NOT DETECTED Final   Enterobacter cloacae complex NOT DETECTED NOT DETECTED Final   Escherichia coli NOT DETECTED NOT DETECTED Final   Klebsiella aerogenes NOT DETECTED NOT DETECTED Final   Klebsiella oxytoca NOT DETECTED NOT DETECTED Final   Klebsiella pneumoniae NOT DETECTED NOT DETECTED Final   Proteus species NOT DETECTED NOT DETECTED Final   Salmonella species NOT DETECTED NOT DETECTED Final   Serratia marcescens NOT DETECTED NOT DETECTED Final   Haemophilus influenzae NOT  DETECTED NOT DETECTED Final   Neisseria meningitidis NOT DETECTED NOT DETECTED Final   Pseudomonas aeruginosa NOT DETECTED NOT DETECTED Final   Stenotrophomonas maltophilia NOT DETECTED NOT DETECTED Final   Candida albicans NOT DETECTED NOT DETECTED Final   Candida auris NOT DETECTED NOT DETECTED Final   Candida glabrata NOT DETECTED NOT DETECTED Final   Candida krusei NOT DETECTED NOT DETECTED Final   Candida parapsilosis NOT DETECTED NOT DETECTED Final   Candida tropicalis NOT DETECTED NOT DETECTED Final   Cryptococcus neoformans/gattii NOT DETECTED NOT DETECTED Final    Comment: Performed at [facility], [address], [place], [place] [address]  Urine culture     Status: None   Collection Time: 01/14/20  7:34 PM   Specimen: Urine, Clean Catch  Result Value Ref Range Status   Specimen Description URINE, CLEAN CATCH  Final   Special Requests NONE  Final   Culture   Final    NO GROWTH Performed at [facility], [place] [address], [place], [place] [address]    Report Status 01/16/2020 FINAL  Final  Resp Panel by RT-PCR (Flu A&B, Covid) Nasopharyngeal Swab     Status: None   Collection Time: 01/14/20  8:11 PM   Specimen: Nasopharyngeal Swab; Nasopharyngeal(NP) swabs in vial transport medium  Result Value Ref Range Status   SARS Coronavirus 2 by RT PCR NEGATIVE NEGATIVE Final    Comment: (NOTE) SARS-CoV-2 target nucleic acids are NOT DETECTED.  The SARS-CoV-2 RNA is generally detectable in upper respiratory specimens during the acute phase of infection. The lowest concentration of SARS-CoV-2 viral copies this assay can detect is 138 copies/mL. A negative result does not preclude SARS-Cov-2 infection and should not be used as the sole basis for treatment or other patient management decisions. A negative result may occur with  improper specimen collection/handling, submission of specimen other than nasopharyngeal swab, presence of viral mutation(s) within the areas  targeted by this assay, and inadequate number of viral copies(<138 copies/mL). A negative result must be combined with clinical observations,  patient history, and epidemiological information. The expected result is Negative.  Fact Sheet for Patients:  EntrepreneurPulse.com.au  Fact Sheet for Healthcare Providers:  IncredibleEmployment.be  This test is no t yet approved or cleared by the [place] FDA and  has been authorized for detection and/or diagnosis of SARS-CoV-2 by FDA under an Emergency Use Authorization (EUA). This EUA will remain  in effect (meaning this test can be used) for the duration of the COVID-19 declaration under Section 564(b)(1) of the Act, 21 U.S.C.section 360bbb-3(b)(1), unless the authorization is terminated  or revoked sooner.       Influenza A by PCR NEGATIVE NEGATIVE Final   Influenza B by PCR NEGATIVE NEGATIVE Final    Comment: (NOTE) The Xpert Xpress SARS-CoV-2/FLU/RSV plus assay is intended as an aid in the diagnosis of influenza from Nasopharyngeal swab specimens and should not be used as a sole basis for treatment. Nasal washings and aspirates are unacceptable for Xpert Xpress SARS-CoV-2/FLU/RSV testing.  Fact Sheet for Patients: EntrepreneurPulse.com.au  Fact Sheet for Healthcare Providers: IncredibleEmployment.be  This test is not yet approved or cleared by the [place] FDA and has been authorized for detection and/or diagnosis of SARS-CoV-2 by FDA under an Emergency Use Authorization (EUA). This EUA will remain in effect (meaning this test can be used) for the duration of the COVID-19 declaration under Section 564(b)(1) of the Act, 21 U.S.C. section 360bbb-3(b)(1), unless the authorization is terminated or revoked.  Performed at [facility], [place] [address] [place], [place] [address]   Culture, Urine     Status: Abnormal (Preliminary result)    Collection Time: 01/18/20  5:30 AM   Specimen: Urine, Random  Result Value Ref Range Status   Specimen Description URINE, RANDOM  Final   Special Requests NONE  Final   Culture (A)  Final    >=100,000 COLONIES/mL ESCHERICHIA COLI CULTURE REINCUBATED FOR BETTER GROWTH Performed at [facility], [place] [address], [place], [place] [address]    Report Status PENDING  Incomplete   Organism ID, Bacteria ESCHERICHIA COLI (A)  Final      Susceptibility   Escherichia coli - MIC*    AMPICILLIN <=2 SENSITIVE Sensitive     CEFAZOLIN <=4 SENSITIVE Sensitive     CEFEPIME <=0.12 SENSITIVE Sensitive     CEFTRIAXONE <=0.25 SENSITIVE Sensitive     CIPROFLOXACIN <=0.25 SENSITIVE Sensitive     GENTAMICIN <=1 SENSITIVE Sensitive     IMIPENEM <=0.25 SENSITIVE Sensitive     NITROFURANTOIN <=16 SENSITIVE Sensitive     TRIMETH/SULFA <=20 SENSITIVE Sensitive     AMPICILLIN/SULBACTAM <=2 SENSITIVE Sensitive     PIP/TAZO <=4 SENSITIVE Sensitive     * >=100,000 COLONIES/mL ESCHERICHIA COLI         Radiology Studies: No results found.      Scheduled Meds: . dexamethasone (DECADRON) injection  4 mg Intravenous Q12H  . levETIRAcetam  1,500 mg Oral BID  . morphine CONCENTRATE  5 mg Sublingual Q6H   Continuous Infusions:   LOS: 6 days    Time spent: 25 minutes    Barb Merino, MD [facility] Pager [phone]

## 2020-01-21 DIAGNOSIS — Z515 Encounter for palliative care: Secondary | ICD-10-CM | POA: Diagnosis not present

## 2020-01-21 LAB — URINE CULTURE: Culture: >=100000 — AB

## 2020-01-21 MED ORDER — CEPHALEXIN 500 MG PO CAPS
500.0000 mg | ORAL_CAPSULE | Freq: Three times a day (TID) | ORAL | Status: AC
  Administered 2020-01-22 – 2020-01-25 (×12): 500 mg via ORAL
  Filled 2020-01-21 (×12): qty 1

## 2020-01-21 MED ORDER — SODIUM CHLORIDE 0.9 % IV SOLN
1.0000 g | Freq: Once | INTRAVENOUS | Status: AC
  Administered 2020-01-21: 1 g via INTRAVENOUS
  Filled 2020-01-21: qty 1

## 2020-01-21 MED ORDER — DEXAMETHASONE 4 MG PO TABS
4.0000 mg | ORAL_TABLET | Freq: Two times a day (BID) | ORAL | Status: DC
  Administered 2020-01-21 – 2020-01-28 (×15): 4 mg via ORAL
  Filled 2020-01-21 (×16): qty 1

## 2020-01-21 MED ORDER — MORPHINE SULFATE (CONCENTRATE) 10 MG/0.5ML PO SOLN: 10.0000 mg | ORAL | Status: DC | PRN

## 2020-01-21 NOTE — Progress Notes (Signed)
Palliative Medicine RN Note: Symptom check. RN reports pt has eaten some, but requires full assist.  Pt wakes easily when I arrive but is unable to answer questions other than nodding when I ask if she is tired. She appears very drowsy.   I note that there are no prn morphine orders; obtained these in case of increased discomfort overnight. PMT will follow up tomorrow if she does not transfer to hospice. I worry that if she does not get a bed soon, we will miss our window for safe transportation. PMT will continue to monitor for decline.  Marjie Skiff Sten Dematteo, RN, BSN, [facility] Palliative Medicine Team 01/21/2020 4:42 PM Office [phone]

## 2020-01-21 NOTE — Care Management Important Message (Signed)
Important Message  Patient Details  Name: Salayah Meares MRN: [id number] Date of Birth: 1945-09-13   Medicare Important Message Given:  Yes - Important Message mailed due to current National Emergency  Verbal consent obtained due to current National Emergency  Relationship to patient: Spouse/Significant Other Contact Name: [name] Call Date: 01/21/20  Time: 1348 Phone: [phone] Outcome: Spoke with contact Important Message mailed to:  (additional copy not needed)    [employer] 01/21/2020, 1:49 PM

## 2020-01-21 NOTE — Progress Notes (Signed)
Nutrition Brief Note  Chart reviewed. Pt now transitioning to comfort care.  No further nutrition interventions warranted at this time.  Please re-consult as needed.   Mariana Single RD, LDN Clinical Nutrition Pager listed in [place]

## 2020-01-21 NOTE — Progress Notes (Signed)
Inpatient Rehab Admissions Coordinator:   Note plans for transition to residential hospice.  CIR will sign off this time.    Shann Medal, PT, DPT Admissions Coordinator [phone] 01/21/20  9:11 AM

## 2020-01-21 NOTE — Plan of Care (Signed)
  Problem: Education: Goal: Knowledge of General Education information will improve Description: Including pain rating scale, medication(s)/side effects and non-pharmacologic comfort measures Outcome: Not Progressing   

## 2020-01-21 NOTE — TOC Progression Note (Signed)
Transition of Care [facility]) - Progression Note    Patient Details  Name: Yu Cragun MRN: [id number] Date of Birth: [birth year]  Transition of Care [facility]) CM/SW Contact  Joanne Chars, LCSW Phone Number: 01/21/2020, 2:47 PM  Clinical Narrative:   CSW spoke with [facility] [place].  No residential bed today, potential for tomorrow.    Expected Discharge Plan: [place]    Expected Discharge Plan and Services Expected Discharge Plan: [place]                                               Social Determinants of Health (SDOH) Interventions    Readmission Risk Interventions Readmission Risk Prevention Plan 11/13/2019  Post Dischage Appt Complete  Medication Screening Complete  Transportation Screening Complete  Some recent data might be hidden

## 2020-01-21 NOTE — Progress Notes (Signed)
PROGRESS NOTE    Christany Beller  BUL:[id number] DOB: Jun 28, 1945 DOA: 01/14/2020 PCP: Pearline Cables, MD    Brief Narrative:  75 year old female with history of left frontal meningioma and cavernous sinus meningioma s/p resection, on steroids and Keppra, hypertension hyperlipidemia, recent history of pulmonary embolism brought to the ER with progressive confusion and weakness, difficulty walking for 10 days.  Recent MRI showed recurrence of left frontal meningioma at the resection site. She was admitted to the hospital, underwent different investigations to look for any secondary cause for encephalopathy and confusion. Followed by oncology, palliative. 1/14, decision made for comfort care. 1/17, remains a stable.  Waiting for inpatient hospice bed availability.   Does not have any family support at home to go home with home hospice.  It may help symptomatically, will treat her E. coli UTI.   Assessment & Plan:   Active Problems:   Hyperlipidemia   Meningioma (HCC)   Acute metabolic encephalopathy   Acute encephalopathy   Epilepsia (HCC)   SIRS (systemic inflammatory response syndrome) (HCC)   History of pulmonary embolus (PE)   Chronic diastolic CHF (congestive heart failure) (HCC)   End of life care  Acute encephalopathy due to recurrent meningioma and suspected seizure: Failure to thrive in adult History of pulmonary embolism Chronic diastolic heart failure End-of-life care Acute UTI present on admission due to E. coli  Plan: Full comfort measure DNR/DNI, MOST and DNR signed. Referral to hospice, waiting for inpatient bed availability at both [facility] and [facility]. All symptom management and end-of-life care orders available, focus on keeping comfort. RN to pronounce death if happens in the hospital. No escalation of care. Continue dexamethasone and Keppra, will help to prevent seizure as long as she can take it. Patient is taking oral medications, will  change to oral dexamethasone, she is already on oral Keppra. Since patient is currently stable, will treat her UTI with 1 dose of IV Rocephin followed by 4 days of oral Keflex.    DVT prophylaxis: Comfort care   Code Status: Comfort care Family Communication: Significant other, Mr. Baldo Ash called and updated. Disposition Plan: Status is: Inpatient  Remains inpatient appropriate because:Unsafe d/c plan Patient has to go to inpatient hospice.  No support at home to go home with home hospice.  Dispo: The patient is from: Home              Anticipated d/c is to: Inpatient hospice              Anticipated d/c date is: 1 day              Patient currently is medically stable to d/c.  Only to the hospice level of care.         Consultants:   Neuro oncology  Neurology  Palliative  Procedures:   EEG  Antimicrobials:   Rocephin, 1/17 1 dose.  Keflex, 1/18----   Subjective: Patient seen and examined.  No overnight events.  She herself denies any complaints.  She is pleasant and confused.  Afebrile. Called and discussed with her significant other and updated. Nursing reported that she was able to eat some food without any trouble, needed some help to set up.  Objective: Vitals:   01/19/20 1618 01/19/20 2058 01/20/20 1444 01/20/20 2058  BP: 115/73 125/82 (!) 151/81 (!) 156/89  Pulse: 77 85 79 66  Resp: 14 16 17 18   Temp: 98.1 F (36.7 C) 99 F (37.2 C) 98.1 F (36.7 C) 98  F (36.7 C)  TempSrc: Oral Oral    SpO2: 95% 96% 95% 95%  Weight:      Height:       No intake or output data in the 24 hours ending 01/21/20 0957 Filed Weights   01/14/20 2200 01/16/20 0030  Weight: 76 kg 76 kg    Examination:  General exam: Appears calm and comfortable on room air. Patient is alert but oriented x 1-2.  She looks calm and quiet, slow to respond but appropriately answers. Respiratory system: Clear to auscultation. Respiratory effort normal.  No added  sounds. Cardiovascular system: S1 & S2 heard, RRR.  Gastrointestinal system: Soft.  Nontender. Skin: No rashes, lesions or ulcers Psychiatry: Judgement and insight appear compromised.  Affect is flat.  Mood is normal.  She has generalized weakness of all extremities.    Data Reviewed: I have personally reviewed following labs and imaging studies  CBC: Recent Labs  Lab 01/14/20 1735 01/14/20 2218 01/15/20 0308 01/16/20 0222 01/17/20 0433 01/18/20 0328  WBC 11.9*  --  11.6* 16.1* 12.1* 13.9*  NEUTROABS 8.9*  --  10.7*  --   --   --   HGB 13.6 10.5* 13.1 12.3 10.8* 12.7  HCT 43.9 31.0* 40.8 39.1 34.4* 40.0  MCV 100.0  --  100.5* 97.8 98.6 97.3  PLT 403*  --  379 370 344 322   Basic Metabolic Panel: Recent Labs  Lab 01/14/20 1735 01/14/20 2218 01/15/20 0308 01/16/20 0605 01/17/20 0433  NA 144 147* 144 145 143  K 4.1 3.1* 3.6 3.6 3.7  CL 103  --  106 108 109  CO2 26  --  21* 26 25  GLUCOSE 89  --  126* 124* 139*  BUN 15  --  16 18 22   CREATININE 0.85  --  0.80 0.75 0.71  CALCIUM 9.4  --  9.2 9.1 8.7*  MG 2.3  --  2.2  --   --   PHOS 3.9  --  4.4  --   --    GFR: Estimated Creatinine Clearance: 60.7 mL/min (by C-G formula based on SCr of 0.71 mg/dL). Liver Function Tests: Recent Labs  Lab 01/14/20 1735 01/15/20 0308 01/17/20 0433  AST 30 18 17   ALT 18 17 18   ALKPHOS 66 62 48  BILITOT 1.0 1.1 0.4  PROT 6.9 6.6 5.5*  ALBUMIN 3.1* 2.9* 2.3*   No results for input(s): LIPASE, AMYLASE in the last 168 hours. Recent Labs  Lab 01/14/20 1758  AMMONIA 51*   Coagulation Profile: Recent Labs  Lab 01/14/20 1735  INR 1.5*   Cardiac Enzymes: Recent Labs  Lab 01/14/20 1735  CKTOTAL 72   BNP (last 3 results) Recent Labs    11/19/19 1512  PROBNP 96.0   HbA1C: No results for input(s): HGBA1C in the last 72 hours. CBG: No results for input(s): GLUCAP in the last 168 hours. Lipid Profile: No results for input(s): CHOL, HDL, LDLCALC, TRIG, CHOLHDL,  LDLDIRECT in the last 72 hours. Thyroid Function Tests: No results for input(s): TSH, T4TOTAL, FREET4, T3FREE, THYROIDAB in the last 72 hours. Anemia Panel: No results for input(s): VITAMINB12, FOLATE, FERRITIN, TIBC, IRON, RETICCTPCT in the last 72 hours. Sepsis Labs: Recent Labs  Lab 01/14/20 1735 01/14/20 1758  PROCALCITON <0.10  --   LATICACIDVEN  --  1.1    Recent Results (from the past 240 hour(s))  Blood culture (routine x 2)     Status: Abnormal   Collection Time: 01/14/20  6:29  PM   Specimen: BLOOD  Result Value Ref Range Status   Specimen Description BLOOD SITE NOT SPECIFIED  Final   Special Requests   Final    BOTTLES DRAWN AEROBIC AND ANAEROBIC Blood Culture adequate volume   Culture  Setup Time   Final    GRAM POSITIVE COCCI IN CLUSTERS AEROBIC BOTTLE ONLY CRITICAL RESULT CALLED TO, READ BACK BY AND VERIFIED WITH: Andres Shad PharmD 8:55 01/17/20 ([email or web address])    Culture (A)  Final    STAPHYLOCOCCUS CAPITIS THE SIGNIFICANCE OF ISOLATING THIS ORGANISM FROM A SINGLE SET OF BLOOD CULTURES WHEN MULTIPLE SETS ARE DRAWN IS UNCERTAIN. PLEASE NOTIFY THE MICROBIOLOGY DEPARTMENT WITHIN ONE WEEK IF SPECIATION AND SENSITIVITIES ARE REQUIRED.    Report Status 01/18/2020 FINAL  Final  Blood Culture ID Panel (Reflexed)     Status: Abnormal   Collection Time: 01/14/20  6:29 PM  Result Value Ref Range Status   Enterococcus faecalis NOT DETECTED NOT DETECTED Final   Enterococcus Faecium NOT DETECTED NOT DETECTED Final   Listeria monocytogenes NOT DETECTED NOT DETECTED Final   Staphylococcus species DETECTED (A) NOT DETECTED Final    Comment: CRITICAL RESULT CALLED TO, READ BACK BY AND VERIFIED WITH: Andres Shad PharmD 8:55 01/17/20 ([email or web address])    Staphylococcus aureus (BCID) NOT DETECTED NOT DETECTED Final   Staphylococcus epidermidis NOT DETECTED NOT DETECTED Final   Staphylococcus lugdunensis NOT DETECTED NOT DETECTED Final   Streptococcus species NOT DETECTED NOT DETECTED Final    Streptococcus agalactiae NOT DETECTED NOT DETECTED Final   Streptococcus pneumoniae NOT DETECTED NOT DETECTED Final   Streptococcus pyogenes NOT DETECTED NOT DETECTED Final   A.calcoaceticus-baumannii NOT DETECTED NOT DETECTED Final   Bacteroides fragilis NOT DETECTED NOT DETECTED Final   Enterobacterales NOT DETECTED NOT DETECTED Final   Enterobacter cloacae complex NOT DETECTED NOT DETECTED Final   Escherichia coli NOT DETECTED NOT DETECTED Final   Klebsiella aerogenes NOT DETECTED NOT DETECTED Final   Klebsiella oxytoca NOT DETECTED NOT DETECTED Final   Klebsiella pneumoniae NOT DETECTED NOT DETECTED Final   Proteus species NOT DETECTED NOT DETECTED Final   Salmonella species NOT DETECTED NOT DETECTED Final   Serratia marcescens NOT DETECTED NOT DETECTED Final   Haemophilus influenzae NOT DETECTED NOT DETECTED Final   Neisseria meningitidis NOT DETECTED NOT DETECTED Final   Pseudomonas aeruginosa NOT DETECTED NOT DETECTED Final   Stenotrophomonas maltophilia NOT DETECTED NOT DETECTED Final   Candida albicans NOT DETECTED NOT DETECTED Final   Candida auris NOT DETECTED NOT DETECTED Final   Candida glabrata NOT DETECTED NOT DETECTED Final   Candida krusei NOT DETECTED NOT DETECTED Final   Candida parapsilosis NOT DETECTED NOT DETECTED Final   Candida tropicalis NOT DETECTED NOT DETECTED Final   Cryptococcus neoformans/gattii NOT DETECTED NOT DETECTED Final    Comment: Performed at [facility], [address], [place], [place] [address]  Urine culture     Status: None   Collection Time: 01/14/20  7:34 PM   Specimen: Urine, Clean Catch  Result Value Ref Range Status   Specimen Description URINE, CLEAN CATCH  Final   Special Requests NONE  Final   Culture   Final    NO GROWTH Performed at [facility], [place] [address] [place],  [address]    Report Status 01/16/2020 FINAL  Final  Resp Panel by RT-PCR (Flu A&B, Covid) Nasopharyngeal Swab     Status: None    Collection Time: 01/14/20  8:11 PM   Specimen: Nasopharyngeal  Swab; Nasopharyngeal(NP) swabs in vial transport medium  Result Value Ref Range Status   SARS Coronavirus 2 by RT PCR NEGATIVE NEGATIVE Final    Comment: (NOTE) SARS-CoV-2 target nucleic acids are NOT DETECTED.  The SARS-CoV-2 RNA is generally detectable in upper respiratory specimens during the acute phase of infection. The lowest concentration of SARS-CoV-2 viral copies this assay can detect is 138 copies/mL. A negative result does not preclude SARS-Cov-2 infection and should not be used as the sole basis for treatment or other patient management decisions. A negative result may occur with  improper specimen collection/handling, submission of specimen other than nasopharyngeal swab, presence of viral mutation(s) within the areas targeted by this assay, and inadequate number of viral copies(<138 copies/mL). A negative result must be combined with clinical observations, patient history, and epidemiological information. The expected result is Negative.  Fact Sheet for Patients:  EntrepreneurPulse.com.au  Fact Sheet for Healthcare Providers:  IncredibleEmployment.be  This test is no t yet approved or cleared by the [place] FDA and  has been authorized for detection and/or diagnosis of SARS-CoV-2 by FDA under an Emergency Use Authorization (EUA). This EUA will remain  in effect (meaning this test can be used) for the duration of the COVID-19 declaration under Section 564(b)(1) of the Act, 21 U.S.C.section 360bbb-3(b)(1), unless the authorization is terminated  or revoked sooner.       Influenza A by PCR NEGATIVE NEGATIVE Final   Influenza B by PCR NEGATIVE NEGATIVE Final    Comment: (NOTE) The Xpert Xpress SARS-CoV-2/FLU/RSV plus assay is intended as an aid in the diagnosis of influenza from Nasopharyngeal swab specimens and should not be used as a sole basis for treatment.  Nasal washings and aspirates are unacceptable for Xpert Xpress SARS-CoV-2/FLU/RSV testing.  Fact Sheet for Patients: EntrepreneurPulse.com.au  Fact Sheet for Healthcare Providers: IncredibleEmployment.be  This test is not yet approved or cleared by the [place] FDA and has been authorized for detection and/or diagnosis of SARS-CoV-2 by FDA under an Emergency Use Authorization (EUA). This EUA will remain in effect (meaning this test can be used) for the duration of the COVID-19 declaration under Section 564(b)(1) of the Act, 21 U.S.C. section 360bbb-3(b)(1), unless the authorization is terminated or revoked.  Performed at [facility], [place] [address], [place], [place] [address]   Culture, Urine     Status: Abnormal   Collection Time: 01/18/20  5:30 AM   Specimen: Urine, Random  Result Value Ref Range Status   Specimen Description URINE, RANDOM  Final   Special Requests   Final    NONE Performed at [facility], [place] [address], [place], [place] [address]    Culture >=100,000 COLONIES/mL ESCHERICHIA COLI (A)  Final   Report Status 01/21/2020 FINAL  Final   Organism ID, Bacteria ESCHERICHIA COLI (A)  Final      Susceptibility   Escherichia coli - MIC*    AMPICILLIN <=2 SENSITIVE Sensitive     CEFAZOLIN <=4 SENSITIVE Sensitive     CEFEPIME <=0.12 SENSITIVE Sensitive     CEFTRIAXONE <=0.25 SENSITIVE Sensitive     CIPROFLOXACIN <=0.25 SENSITIVE Sensitive     GENTAMICIN <=1 SENSITIVE Sensitive     IMIPENEM <=0.25 SENSITIVE Sensitive     NITROFURANTOIN <=16 SENSITIVE Sensitive     TRIMETH/SULFA <=20 SENSITIVE Sensitive     AMPICILLIN/SULBACTAM <=2 SENSITIVE Sensitive     PIP/TAZO <=4 SENSITIVE Sensitive     * >=100,000 COLONIES/mL ESCHERICHIA COLI  Radiology Studies: No results found.      Scheduled Meds: . [START ON 01/22/2020] cephALEXin  500 mg Oral Q8H  . dexamethasone  4 mg Oral Q12H  . levETIRAcetam   1,500 mg Oral BID  . morphine CONCENTRATE  5 mg Sublingual Q6H   Continuous Infusions: . cefTRIAXone (ROCEPHIN)  IV       LOS: 7 days    Time spent: 25 minutes    Barb Merino, MD [facility] Pager [phone]

## 2020-01-22 DIAGNOSIS — Z515 Encounter for palliative care: Secondary | ICD-10-CM | POA: Diagnosis not present

## 2020-01-22 NOTE — Progress Notes (Signed)
Palliative Medicine RN Note: Symptom check.  Pt is able to open her eyes and shake her head no when asked about pain/SOB, but otherwise did not move while I assessed her. She appears sleepier than yesterday, and the NT confirmed that she has not been awake today.   I will continue to follow.  Marjie Skiff Genita Nilsson, RN, BSN, [facility] Palliative Medicine Team 01/22/2020 3:58 PM Office [phone]

## 2020-01-22 NOTE — Plan of Care (Signed)
  Problem: Education: Goal: Knowledge of General Education information will improve Description Including pain rating scale, medication(s)/side effects and non-pharmacologic comfort measures Outcome: Progressing   

## 2020-01-22 NOTE — Progress Notes (Signed)
PROGRESS NOTE    [name] [name]  [id number] DOB: 1945/11/08 DOA: 01/14/2020 PCP: Darreld Mclean, MD    Brief Narrative:  75 year old female with history of left frontal meningioma and cavernous sinus meningioma s/p resection, on steroids and Keppra, hypertension hyperlipidemia, recent history of pulmonary embolism brought to the ER with progressive confusion and weakness, difficulty walking for 10 days.  Recent MRI showed recurrence of left frontal meningioma at the resection site. She was admitted to the hospital, underwent different investigations to look for any secondary cause for encephalopathy and confusion. Followed by oncology, palliative. 1/14, decision made for comfort care. 1/17, remains a stable.  Waiting for inpatient hospice bed availability.   Does not have any family support at home to go home with home hospice.  It may help symptomatically, will treat her E. coli UTI.   Assessment & Plan:   Active Problems:   Hyperlipidemia   Meningioma (HCC)   Acute metabolic encephalopathy   Acute encephalopathy   Epilepsia ([place])   SIRS (systemic inflammatory response syndrome) (HCC)   History of pulmonary embolus (PE)   Chronic diastolic CHF (congestive heart failure) (HCC)   End of life care  Acute encephalopathy due to recurrent meningioma and suspected seizure: Failure to thrive in adult History of pulmonary embolism Chronic diastolic heart failure End-of-life care Acute UTI present on admission due to E. coli  Plan: Full comfort measure DNR/DNI, MOST and DNR signed. Referral to hospice, waiting for inpatient bed availability at both [facility] and [facility] facility. All symptom management and end-of-life care orders available, focus on keeping comfort. RN to pronounce death if happens in the hospital. No escalation of care. We will continue oral dexamethasone and Keppra, will consider it as comfort care medications. 1 dose of Rocephin given for E. coli  UTI, will keep on 4 days of Keflex.    DVT prophylaxis: Comfort care   Code Status: Comfort care Family Communication: Significant other, Mr. [name] called and updated 1/17. Disposition Plan: Status is: Inpatient  Remains inpatient appropriate because:Unsafe d/c plan Patient has to go to inpatient hospice.  No support at home to go home with home hospice.  Dispo: The patient is from: Home              Anticipated d/c is to: Inpatient hospice              Anticipated d/c date is: 1 day              Patient currently is medically stable to d/c.  Only to the hospice level of care.  Medically stable to transfer to inpatient hospice whenever bed is available.       Consultants:   Neuro oncology  Neurology  Palliative  Procedures:   EEG  Antimicrobials:   Rocephin, 1/17 1 dose.  Keflex, 1/18----   Subjective: Patient seen and examined.  No overnight events.  Pleasantly confused and denies any complaints.  Nursing reported that she is able to eat minimum amount of food with help.  Objective: Vitals:   01/20/20 1444 01/20/20 2058 01/21/20 1356 01/21/20 1939  BP: (!) 151/81 (!) 156/89 (!) 154/85 122/73  Pulse: 79 66 83 76  Resp: 17 18  18   Temp: 98.1 F (36.7 C) 98 F (36.7 C) 98 F (36.7 C) 97.8 F (36.6 C)  TempSrc:    Oral  SpO2: 95% 95% 95% 94%  Weight:      Height:        Intake/Output Summary (  Last 24 hours) at 01/22/2020 0930 Last data filed at 01/22/2020 0345 Gross per 24 hour  Intake -  Output 50 ml  Net -50 ml   Filed Weights   01/14/20 2200 01/16/20 0030  Weight: 76 kg 76 kg    Examination:  General exam: Appears calm and comfortable on room air. Patient is alert but oriented x 1.  She looks calm and quiet, slow to respond but appropriately answers some questions. Respiratory system: Clear to auscultation. Respiratory effort normal.  No added sounds. Cardiovascular system: S1 & S2 heard, RRR.  Gastrointestinal system: Soft.   Nontender. Skin: No rashes, lesions or ulcers Psychiatry: Judgement and insight appear compromised.  Affect is flat.  Mood is normal.  She has generalized weakness of all extremities.    Data Reviewed: I have personally reviewed following labs and imaging studies  CBC: Recent Labs  Lab 01/16/20 0222 01/17/20 0433 01/18/20 0328  WBC 16.1* 12.1* 13.9*  HGB 12.3 10.8* 12.7  HCT 39.1 34.4* 40.0  MCV 97.8 98.6 97.3  PLT 370 344 008   Basic Metabolic Panel: Recent Labs  Lab 01/16/20 0605 01/17/20 0433  NA 145 143  K 3.6 3.7  CL 108 109  CO2 26 25  GLUCOSE 124* 139*  BUN 18 22  CREATININE 0.75 0.71  CALCIUM 9.1 8.7*   GFR: Estimated Creatinine Clearance: 60.7 mL/min (by C-G formula based on SCr of 0.71 mg/dL). Liver Function Tests: Recent Labs  Lab 01/17/20 0433  AST 17  ALT 18  ALKPHOS 48  BILITOT 0.4  PROT 5.5*  ALBUMIN 2.3*   No results for input(s): LIPASE, AMYLASE in the last 168 hours. No results for input(s): AMMONIA in the last 168 hours. Coagulation Profile: No results for input(s): INR, PROTIME in the last 168 hours. Cardiac Enzymes: No results for input(s): CKTOTAL, CKMB, CKMBINDEX, TROPONINI in the last 168 hours. BNP (last 3 results) Recent Labs    11/19/19 1512  PROBNP 96.0   HbA1C: No results for input(s): HGBA1C in the last 72 hours. CBG: No results for input(s): GLUCAP in the last 168 hours. Lipid Profile: No results for input(s): CHOL, HDL, LDLCALC, TRIG, CHOLHDL, LDLDIRECT in the last 72 hours. Thyroid Function Tests: No results for input(s): TSH, T4TOTAL, FREET4, T3FREE, THYROIDAB in the last 72 hours. Anemia Panel: No results for input(s): VITAMINB12, FOLATE, FERRITIN, TIBC, IRON, RETICCTPCT in the last 72 hours. Sepsis Labs: No results for input(s): PROCALCITON, LATICACIDVEN in the last 168 hours.  Recent Results (from the past 240 hour(s))  Blood culture (routine x 2)     Status: Abnormal   Collection Time: 01/14/20  6:29 PM    Specimen: BLOOD  Result Value Ref Range Status   Specimen Description BLOOD SITE NOT SPECIFIED  Final   Special Requests   Final    BOTTLES DRAWN AEROBIC AND ANAEROBIC Blood Culture adequate volume   Culture  Setup Time   Final    GRAM POSITIVE COCCI IN CLUSTERS AEROBIC BOTTLE ONLY CRITICAL RESULT CALLED TO, READ BACK BY AND VERIFIED WITH: Andres Shad PharmD 8:55 01/17/20 ([email or web address])    Culture (A)  Final    STAPHYLOCOCCUS CAPITIS THE SIGNIFICANCE OF ISOLATING THIS ORGANISM FROM A SINGLE SET OF BLOOD CULTURES WHEN MULTIPLE SETS ARE DRAWN IS UNCERTAIN. PLEASE NOTIFY THE MICROBIOLOGY DEPARTMENT WITHIN ONE WEEK IF SPECIATION AND SENSITIVITIES ARE REQUIRED.    Report Status 01/18/2020 FINAL  Final  Blood Culture ID Panel (Reflexed)     Status: Abnormal   Collection Time:  01/14/20  6:29 PM  Result Value Ref Range Status   Enterococcus faecalis NOT DETECTED NOT DETECTED Final   Enterococcus Faecium NOT DETECTED NOT DETECTED Final   Listeria monocytogenes NOT DETECTED NOT DETECTED Final   Staphylococcus species DETECTED (A) NOT DETECTED Final    Comment: CRITICAL RESULT CALLED TO, READ BACK BY AND VERIFIED WITH: Andres Shad PharmD 8:55 01/17/20 ([email or web address])    Staphylococcus aureus (BCID) NOT DETECTED NOT DETECTED Final   Staphylococcus epidermidis NOT DETECTED NOT DETECTED Final   Staphylococcus lugdunensis NOT DETECTED NOT DETECTED Final   Streptococcus species NOT DETECTED NOT DETECTED Final   Streptococcus agalactiae NOT DETECTED NOT DETECTED Final   Streptococcus pneumoniae NOT DETECTED NOT DETECTED Final   Streptococcus pyogenes NOT DETECTED NOT DETECTED Final   A.calcoaceticus-baumannii NOT DETECTED NOT DETECTED Final   Bacteroides fragilis NOT DETECTED NOT DETECTED Final   Enterobacterales NOT DETECTED NOT DETECTED Final   Enterobacter cloacae complex NOT DETECTED NOT DETECTED Final   Escherichia coli NOT DETECTED NOT DETECTED Final   Klebsiella aerogenes NOT DETECTED NOT DETECTED Final    Klebsiella oxytoca NOT DETECTED NOT DETECTED Final   Klebsiella pneumoniae NOT DETECTED NOT DETECTED Final   Proteus species NOT DETECTED NOT DETECTED Final   Salmonella species NOT DETECTED NOT DETECTED Final   Serratia marcescens NOT DETECTED NOT DETECTED Final   Haemophilus influenzae NOT DETECTED NOT DETECTED Final   Neisseria meningitidis NOT DETECTED NOT DETECTED Final   Pseudomonas aeruginosa NOT DETECTED NOT DETECTED Final   Stenotrophomonas maltophilia NOT DETECTED NOT DETECTED Final   Candida albicans NOT DETECTED NOT DETECTED Final   Candida auris NOT DETECTED NOT DETECTED Final   Candida glabrata NOT DETECTED NOT DETECTED Final   Candida krusei NOT DETECTED NOT DETECTED Final   Candida parapsilosis NOT DETECTED NOT DETECTED Final   Candida tropicalis NOT DETECTED NOT DETECTED Final   Cryptococcus neoformans/gattii NOT DETECTED NOT DETECTED Final    Comment: Performed at [facility], [address], [place], [place] [address]  Urine culture     Status: None   Collection Time: 01/14/20  7:34 PM   Specimen: Urine, Clean Catch  Result Value Ref Range Status   Specimen Description URINE, CLEAN CATCH  Final   Special Requests NONE  Final   Culture   Final    NO GROWTH Performed at [facility], [place] [address] [place], [place] [address]    Report Status 01/16/2020 FINAL  Final  Resp Panel by RT-PCR (Flu A&B, Covid) Nasopharyngeal Swab     Status: None   Collection Time: 01/14/20  8:11 PM   Specimen: Nasopharyngeal Swab; Nasopharyngeal(NP) swabs in vial transport medium  Result Value Ref Range Status   SARS Coronavirus 2 by RT PCR NEGATIVE NEGATIVE Final    Comment: (NOTE) SARS-CoV-2 target nucleic acids are NOT DETECTED.  The SARS-CoV-2 RNA is generally detectable in upper respiratory specimens during the acute phase of infection. The lowest concentration of SARS-CoV-2 viral copies this assay can detect is 138 copies/mL. A negative result does not preclude  SARS-Cov-2 infection and should not be used as the sole basis for treatment or other patient management decisions. A negative result may occur with  improper specimen collection/handling, submission of specimen other than nasopharyngeal swab, presence of viral mutation(s) within the areas targeted by this assay, and inadequate number of viral copies(<138 copies/mL). A negative result must be combined with clinical observations, patient history, and epidemiological information. The expected result is Negative.  Fact  Sheet for Patients:  EntrepreneurPulse.com.au  Fact Sheet for Healthcare Providers:  IncredibleEmployment.be  This test is no t yet approved or cleared by the [place] FDA and  has been authorized for detection and/or diagnosis of SARS-CoV-2 by FDA under an Emergency Use Authorization (EUA). This EUA will remain  in effect (meaning this test can be used) for the duration of the COVID-19 declaration under Section 564(b)(1) of the Act, 21 U.S.C.section 360bbb-3(b)(1), unless the authorization is terminated  or revoked sooner.       Influenza A by PCR NEGATIVE NEGATIVE Final   Influenza B by PCR NEGATIVE NEGATIVE Final    Comment: (NOTE) The Xpert Xpress SARS-CoV-2/FLU/RSV plus assay is intended as an aid in the diagnosis of influenza from Nasopharyngeal swab specimens and should not be used as a sole basis for treatment. Nasal washings and aspirates are unacceptable for Xpert Xpress SARS-CoV-2/FLU/RSV testing.  Fact Sheet for Patients: EntrepreneurPulse.com.au  Fact Sheet for Healthcare Providers: IncredibleEmployment.be  This test is not yet approved or cleared by the [place] FDA and has been authorized for detection and/or diagnosis of SARS-CoV-2 by FDA under an Emergency Use Authorization (EUA). This EUA will remain in effect (meaning this test can be used) for the duration of  the COVID-19 declaration under Section 564(b)(1) of the Act, 21 U.S.C. section 360bbb-3(b)(1), unless the authorization is terminated or revoked.  Performed at [facility], [place] [address], [place], [place] [address]   Culture, Urine     Status: Abnormal   Collection Time: 01/18/20  5:30 AM   Specimen: Urine, Random  Result Value Ref Range Status   Specimen Description URINE, RANDOM  Final   Special Requests   Final    NONE Performed at [facility], [place] [address] [place], [place] [address]    Culture >=100,000 COLONIES/mL ESCHERICHIA COLI (A)  Final   Report Status 01/21/2020 FINAL  Final   Organism ID, Bacteria ESCHERICHIA COLI (A)  Final      Susceptibility   Escherichia coli - MIC*    AMPICILLIN <=2 SENSITIVE Sensitive     CEFAZOLIN <=4 SENSITIVE Sensitive     CEFEPIME <=0.12 SENSITIVE Sensitive     CEFTRIAXONE <=0.25 SENSITIVE Sensitive     CIPROFLOXACIN <=0.25 SENSITIVE Sensitive     GENTAMICIN <=1 SENSITIVE Sensitive     IMIPENEM <=0.25 SENSITIVE Sensitive     NITROFURANTOIN <=16 SENSITIVE Sensitive     TRIMETH/SULFA <=20 SENSITIVE Sensitive     AMPICILLIN/SULBACTAM <=2 SENSITIVE Sensitive     PIP/TAZO <=4 SENSITIVE Sensitive     * >=100,000 COLONIES/mL ESCHERICHIA COLI         Radiology Studies: No results found.      Scheduled Meds: . cephALEXin  500 mg Oral Q8H  . dexamethasone  4 mg Oral Q12H  . levETIRAcetam  1,500 mg Oral BID  . morphine CONCENTRATE  5 mg Sublingual Q6H   Continuous Infusions:    LOS: 8 days    Time spent: 25 minutes    Barb Merino, MD [facility] Pager [phone]

## 2020-01-23 DIAGNOSIS — Z515 Encounter for palliative care: Secondary | ICD-10-CM | POA: Diagnosis not present

## 2020-01-23 NOTE — Plan of Care (Signed)
  Problem: Education: Goal: Knowledge of General Education information will improve Description Including pain rating scale, medication(s)/side effects and non-pharmacologic comfort measures Outcome: Progressing   

## 2020-01-23 NOTE — Progress Notes (Signed)
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  Recent MRI showed recurrence of left frontal meningioma at the resection site. She was admitted to the hospital, underwent different investigations to look for any secondary cause for encephalopathy and confusion. Followed by oncology, palliative. 1/14, decision made for comfort care. 1/17, remains a stable.  Waiting for inpatient hospice bed availability.   Does not have any family support at home to go home with home hospice.  It may help symptomatically, will treat her E. coli UTI. 1/19, remains in the hospital waiting for inpatient hospice bed.  Fairly comfortable and symptoms are managed.   Assessment & Plan:   Active Problems:   Hyperlipidemia   Meningioma (HCC)   Acute metabolic encephalopathy   Acute encephalopathy   Epilepsia ([place])   SIRS (systemic inflammatory response syndrome) (HCC)   History of pulmonary embolus (PE)   Chronic diastolic CHF (congestive heart failure) (HCC)   End of life care  Acute encephalopathy due to recurrent meningioma and suspected seizure: Failure to thrive in adult History of pulmonary embolism Chronic diastolic heart failure End-of-life care Acute UTI present on admission due to E. coli  Plan: Full comfort measure DNR/DNI, MOST and DNR signed. Referral to hospice, waiting for inpatient bed availability at both [facility] and [facility] facility. All symptom management and end-of-life care orders available, focus on keeping comfort. RN to pronounce death if happens in the hospital. No escalation of care. We will continue oral  dexamethasone and Keppra, will consider it as comfort care medications. 1 dose of Rocephin given for E. coli UTI, will keep on 4 days of Keflex.    DVT prophylaxis: Comfort care   Code Status: Comfort care Family Communication: Significant other, Mr. [name] called and updated 1/17. Disposition Plan: Status is: Inpatient  Remains inpatient appropriate because:Unsafe d/c plan Patient has to go to inpatient hospice.  No support at home to go home with home hospice.  Dispo: The patient is from: Home              Anticipated d/c is to: Inpatient hospice              Anticipated d/c date is: 1 day              Patient currently is medically stable to d/c.  Only to the hospice level of care.  Medically stable to transfer to inpatient hospice whenever bed is available.       Consultants:   Neuro oncology  Neurology  Palliative  Procedures:   EEG  Antimicrobials:   Rocephin, 1/17 1 dose.  Keflex, 1/18----   Subjective: Patient seen and examined.  No overnight events.  Poor historian and denies any complaints. Today, to nursing staff helped her to get to the chair, she was happy with it.  Objective: Vitals:   01/21/20 1939 01/22/20 1403 01/22/20 1403 01/22/20 1938  BP: 122/73  130/77 116/74  Pulse: 76  82 74  Resp: 18   18  Temp: 97.8 F (36.6 C) 98.7 F (37.1 C)  98.2 F (36.8 C)  TempSrc: Oral Oral  Oral  SpO2: 94%   95%  Weight:  Height:        Intake/Output Summary (Last 24 hours) at 01/23/2020 1053 Last data filed at 01/23/2020 0906 Gross per 24 hour  Intake 760 ml  Output 1150 ml  Net -390 ml   Filed Weights   01/14/20 2200 01/16/20 0030  Weight: 76 kg 76 kg    Examination:  General exam: Appears calm and comfortable on room air.  Iron Patient is alert but not oriented.  Withdrawn, flat affect.  She looks calm and quiet, slow to respond but appropriately answers some questions. Respiratory system: Clear to auscultation. Respiratory effort  normal.  No added sounds. Cardiovascular system: S1 & S2 heard, RRR.  Gastrointestinal system: Soft.  Nontender. Skin: No rashes, lesions or ulcers Psychiatry: Judgement and insight appear compromised.  Affect is flat.  She has generalized weakness of all extremities.    Data Reviewed: I have personally reviewed following labs and imaging studies  CBC: Recent Labs  Lab 01/17/20 0433 01/18/20 0328  WBC 12.1* 13.9*  HGB 10.8* 12.7  HCT 34.4* 40.0  MCV 98.6 97.3  PLT 344 [id number]   Basic Metabolic Panel: Recent Labs  Lab 01/17/20 0433  NA 143  K 3.7  CL 109  CO2 25  GLUCOSE 139*  BUN 22  CREATININE 0.71  CALCIUM 8.7*   GFR: Estimated Creatinine Clearance: 60.7 mL/min (by C-G formula based on SCr of 0.71 mg/dL). Liver Function Tests: Recent Labs  Lab 01/17/20 0433  AST 17  ALT 18  ALKPHOS 48  BILITOT 0.4  PROT 5.5*  ALBUMIN 2.3*   No results for input(s): LIPASE, AMYLASE in the last 168 hours. No results for input(s): AMMONIA in the last 168 hours. Coagulation Profile: No results for input(s): INR, PROTIME in the last 168 hours. Cardiac Enzymes: No results for input(s): CKTOTAL, CKMB, CKMBINDEX, TROPONINI in the last 168 hours. BNP (last 3 results) Recent Labs    11/19/19 1512  PROBNP 96.0   HbA1C: No results for input(s): HGBA1C in the last 72 hours. CBG: No results for input(s): GLUCAP in the last 168 hours. Lipid Profile: No results for input(s): CHOL, HDL, LDLCALC, TRIG, CHOLHDL, LDLDIRECT in the last 72 hours. Thyroid Function Tests: No results for input(s): TSH, T4TOTAL, FREET4, T3FREE, THYROIDAB in the last 72 hours. Anemia Panel: No results for input(s): VITAMINB12, FOLATE, FERRITIN, TIBC, IRON, RETICCTPCT in the last 72 hours. Sepsis Labs: No results for input(s): PROCALCITON, LATICACIDVEN in the last 168 hours.  Recent Results (from the past 240 hour(s))  Blood culture (routine x 2)     Status: Abnormal   Collection Time: 01/14/20  6:29 PM    Specimen: BLOOD  Result Value Ref Range Status   Specimen Description BLOOD SITE NOT SPECIFIED  Final   Special Requests   Final    BOTTLES DRAWN AEROBIC AND ANAEROBIC Blood Culture adequate volume   Culture  Setup Time   Final    GRAM POSITIVE COCCI IN CLUSTERS AEROBIC BOTTLE ONLY CRITICAL RESULT CALLED TO, READ BACK BY AND VERIFIED WITH: Andres Shad PharmD 8:55 01/17/20 ([email or web address])    Culture (A)  Final    STAPHYLOCOCCUS CAPITIS THE SIGNIFICANCE OF ISOLATING THIS ORGANISM FROM A SINGLE SET OF BLOOD CULTURES WHEN MULTIPLE SETS ARE DRAWN IS UNCERTAIN. PLEASE NOTIFY THE MICROBIOLOGY DEPARTMENT WITHIN ONE WEEK IF SPECIATION AND SENSITIVITIES ARE REQUIRED.    Report Status 01/18/2020 FINAL  Final  Blood Culture ID Panel (Reflexed)     Status: Abnormal   Collection Time: 01/14/20  6:29 PM  Result Value Ref Range Status   Enterococcus faecalis NOT DETECTED NOT DETECTED Final   Enterococcus Faecium NOT DETECTED NOT DETECTED Final   Listeria monocytogenes NOT DETECTED NOT DETECTED Final   Staphylococcus species DETECTED (A) NOT DETECTED Final    Comment: CRITICAL RESULT CALLED TO, READ BACK BY AND VERIFIED WITH: Andres Shad PharmD 8:55 01/17/20 ([email or web address])    Staphylococcus aureus (BCID) NOT DETECTED NOT DETECTED Final   Staphylococcus epidermidis NOT DETECTED NOT DETECTED Final   Staphylococcus lugdunensis NOT DETECTED NOT DETECTED Final   Streptococcus species NOT DETECTED NOT DETECTED Final   Streptococcus agalactiae NOT DETECTED NOT DETECTED Final   Streptococcus pneumoniae NOT DETECTED NOT DETECTED Final   Streptococcus pyogenes NOT DETECTED NOT DETECTED Final   A.calcoaceticus-baumannii NOT DETECTED NOT DETECTED Final   Bacteroides fragilis NOT DETECTED NOT DETECTED Final   Enterobacterales NOT DETECTED NOT DETECTED Final   Enterobacter cloacae complex NOT DETECTED NOT DETECTED Final   Escherichia coli NOT DETECTED NOT DETECTED Final   Klebsiella aerogenes NOT DETECTED NOT DETECTED Final    Klebsiella oxytoca NOT DETECTED NOT DETECTED Final   Klebsiella pneumoniae NOT DETECTED NOT DETECTED Final   Proteus species NOT DETECTED NOT DETECTED Final   Salmonella species NOT DETECTED NOT DETECTED Final   Serratia marcescens NOT DETECTED NOT DETECTED Final   Haemophilus influenzae NOT DETECTED NOT DETECTED Final   Neisseria meningitidis NOT DETECTED NOT DETECTED Final   Pseudomonas aeruginosa NOT DETECTED NOT DETECTED Final   Stenotrophomonas maltophilia NOT DETECTED NOT DETECTED Final   Candida albicans NOT DETECTED NOT DETECTED Final   Candida auris NOT DETECTED NOT DETECTED Final   Candida glabrata NOT DETECTED NOT DETECTED Final   Candida krusei NOT DETECTED NOT DETECTED Final   Candida parapsilosis NOT DETECTED NOT DETECTED Final   Candida tropicalis NOT DETECTED NOT DETECTED Final   Cryptococcus neoformans/gattii NOT DETECTED NOT DETECTED Final    Comment: Performed at [facility], [address] [place], [place] [address]  Urine culture     Status: None   Collection Time: 01/14/20  7:34 PM   Specimen: Urine, Clean Catch  Result Value Ref Range Status   Specimen Description URINE, CLEAN CATCH  Final   Special Requests NONE  Final   Culture   Final    NO GROWTH Performed at [facility], [place] [address], [place], [place] [address]    Report Status 01/16/2020 FINAL  Final  Resp Panel by RT-PCR (Flu A&B, Covid) Nasopharyngeal Swab     Status: None   Collection Time: 01/14/20  8:11 PM   Specimen: Nasopharyngeal Swab; Nasopharyngeal(NP) swabs in vial transport medium  Result Value Ref Range Status   SARS Coronavirus 2 by RT PCR NEGATIVE NEGATIVE Final    Comment: (NOTE) SARS-CoV-2 target nucleic acids are NOT DETECTED.  The SARS-CoV-2 RNA is generally detectable in upper respiratory specimens during the acute phase of infection. The lowest concentration of SARS-CoV-2 viral copies this assay can detect is 138 copies/mL. A negative result does not preclude  SARS-Cov-2 infection and should not be used as the sole basis for treatment or other patient management decisions. A negative result may occur with  improper specimen collection/handling, submission of specimen other than nasopharyngeal swab, presence of viral mutation(s) within the areas targeted by this assay, and inadequate number of viral copies(<138 copies/mL). A negative result must be combined with clinical observations, patient history, and epidemiological information. The expected result is Negative.  Fact Sheet for Patients:  EntrepreneurPulse.com.au  Fact Sheet for Healthcare Providers:  IncredibleEmployment.be  This test is no t yet approved or cleared by the [place] FDA and  has been authorized for detection and/or diagnosis of SARS-CoV-2 by FDA under an Emergency Use Authorization (EUA). This EUA will remain  in effect (meaning this test can be used) for the duration of the COVID-19 declaration under Section 564(b)(1) of the Act, 21 U.S.C.section 360bbb-3(b)(1), unless the authorization is terminated  or revoked sooner.       Influenza A by PCR NEGATIVE NEGATIVE Final   Influenza B by PCR NEGATIVE NEGATIVE Final    Comment: (NOTE) The Xpert Xpress SARS-CoV-2/FLU/RSV plus assay is intended as an aid in the diagnosis of influenza from Nasopharyngeal swab specimens and should not be used as a sole basis for treatment. Nasal washings and aspirates are unacceptable for Xpert Xpress SARS-CoV-2/FLU/RSV testing.  Fact Sheet for Patients: EntrepreneurPulse.com.au  Fact Sheet for Healthcare Providers: IncredibleEmployment.be  This test is not yet approved or cleared by the [place] FDA and has been authorized for detection and/or diagnosis of SARS-CoV-2 by FDA under an Emergency Use Authorization (EUA). This EUA will remain in effect (meaning this test can be used) for the duration of  the COVID-19 declaration under Section 564(b)(1) of the Act, 21 U.S.C. section 360bbb-3(b)(1), unless the authorization is terminated or revoked.  Performed at [facility], [place] [address] [place], [place] [address]   Culture, Urine     Status: Abnormal   Collection Time: 01/18/20  5:30 AM   Specimen: Urine, Random  Result Value Ref Range Status   Specimen Description URINE, RANDOM  Final   Special Requests   Final    NONE Performed at [facility], [place] [address], [place], [place] [address]    Culture >=100,000 COLONIES/mL ESCHERICHIA COLI (A)  Final   Report Status 01/21/2020 FINAL  Final   Organism ID, Bacteria ESCHERICHIA COLI (A)  Final      Susceptibility   Escherichia coli - MIC*    AMPICILLIN <=2 SENSITIVE Sensitive     CEFAZOLIN <=4 SENSITIVE Sensitive     CEFEPIME <=0.12 SENSITIVE Sensitive     CEFTRIAXONE <=0.25 SENSITIVE Sensitive     CIPROFLOXACIN <=0.25 SENSITIVE Sensitive     GENTAMICIN <=1 SENSITIVE Sensitive     IMIPENEM <=0.25 SENSITIVE Sensitive     NITROFURANTOIN <=16 SENSITIVE Sensitive     TRIMETH/SULFA <=20 SENSITIVE Sensitive     AMPICILLIN/SULBACTAM <=2 SENSITIVE Sensitive     PIP/TAZO <=4 SENSITIVE Sensitive     * >=100,000 COLONIES/mL ESCHERICHIA COLI         Radiology Studies: No results found.      Scheduled Meds: . cephALEXin  500 mg Oral Q8H  . dexamethasone  4 mg Oral Q12H  . levETIRAcetam  1,500 mg Oral BID  . morphine CONCENTRATE  5 mg Sublingual Q6H   Continuous Infusions:    LOS: 9 days    Time spent: 25 minutes    Barb Merino, MD [facility] Pager [phone]

## 2020-01-23 NOTE — TOC Progression Note (Addendum)
Transition of Care [facility]) - Progression Note    Patient Details  Name: [name] [name] MRN: [id number] Date of Birth: [birth year]  Transition of Care [facility]) CM/SW [place],  Phone Number: 01/23/2020, 11:48 AM  Clinical Narrative:   CSW spoke with Admissions at [facility], still no bed available at this time. CSW to follow.  UPDATE 5:11 PM: CSW received call from Admissions at [facility] that after further review of patient's current clinical condition, she is no longer meeting criteria for residential hospice. Hospice Admissions spoke with patient's family, and they have decided they will take her home with hospice services instead. Hospice to order needed DME and update CSW when equipment is delivered for patient to go home. CSW to follow.   Expected Discharge Plan: [place]    Expected Discharge Plan and Services Expected Discharge Plan: [place]                                               Social Determinants of Health (SDOH) Interventions    Readmission Risk Interventions Readmission Risk Prevention Plan 11/13/2019  Post Dischage Appt Complete  Medication Screening Complete  Transportation Screening Complete  Some recent data might be hidden

## 2020-01-23 NOTE — Progress Notes (Signed)
   Referral received several days ago and have been monitoring the pt while in hospital. I have spoke to the SO several times and today after discussion with my Market researcher. She feels the pt is not appropriate for the hospice facility GIP level of care. She wonders about prognosis and knows that this is a rapidly growing tumor but feels that with her appetite as good as it is and her getting to chair with assistance etc. That she may have prognosis of couple months and is recommending pt go home with hospice. I had a long discussion with the SO about the support of our interdisplinary team at home and he is in agreement to this. He states he will need some equipment which I have ordered for the pt. Hospital bed, over bed table  and a wheelchair.  He will take her to his house which is in [place] because his house is one story and not two story like hers. She has been approved for hospice care at home.   Webb Silversmith RN [phone]

## 2020-01-23 NOTE — Plan of Care (Signed)

## 2020-01-23 NOTE — Progress Notes (Signed)
Palliative Medicine RN Note: Symptom check.  Pt is sleeping in the recliner. She is, again, very sleepy during my visit and denies pain/SOB. Per Cheri at [facility], there are no residential beds open for her today.  Marjie Skiff Zilla Shartzer, RN, BSN, [facility] Palliative Medicine Team 01/23/2020 11:03 AM Office [phone]

## 2020-01-24 MED ORDER — LORAZEPAM 2 MG/ML IJ SOLN: 1.0000 mg | INTRAMUSCULAR | Status: DC | PRN

## 2020-01-24 NOTE — TOC Progression Note (Signed)
Transition of Care [facility]) - Progression Note    Patient Details  Name: [name] [name] MRN: [id number] Date of Birth: 1945-12-12  Transition [facility] Care [facility]) CM/SW Contact  Joanne Chars, LCSW Phone Number: 01/24/2020, 2:42 PM  Clinical Narrative:   CSW spoke with pt and pt significant other, [name] [name], to discuss next steps.  Pt gives permission to speak with [name] [name]. He is interested in [facility] if they would be an option, CSW will make this referral but shared that they have a wait list currently.  [name] [name] in agreement that pt would go to SNF, pending recommendation from PT.  He called his daughter who is also assisting in this and she is visiting [name], [name], and [name].  Choice document given.  Pt is vaccinated for covid and boosted.  1445: CSW left message with [facility].      Expected Discharge Plan: [place]    Expected Discharge Plan and Services Expected Discharge Plan: [place]                                               Social Determinants of Health (SDOH) Interventions    Readmission Risk Interventions Readmission Risk Prevention Plan 11/13/2019  Post Dischage Appt Complete  Medication Screening Complete  Transportation Screening Complete  Some recent data might be hidden

## 2020-01-24 NOTE — Progress Notes (Signed)
Palliative Medicine RN Note: Symptom check. Patient is the most awake I've seen her today. She is in bed, denies pain or SOB. She is relaxed and not picking; she did pick at my sleeve when I reached across her for the TV remote, but other than that, she made sense.  RN reports she did not sleep well last night. I will ensure she has orders for prn insomnia.  Marjie Skiff Devon Kingdon, RN, BSN, [facility] Palliative Medicine Team 01/24/2020 11:46 AM Office [phone]

## 2020-01-24 NOTE — Progress Notes (Signed)
PROGRESS NOTE    Tula Schryver  ZOX:[id number] DOB: 1945/07/06 DOA: 01/14/2020 PCP: Darreld Mclean, MD    Brief Narrative:  75 year old female with history of left frontal meningioma and cavernous sinus meningioma s/p resection, on steroids and Keppra, hypertension hyperlipidemia, recent history of pulmonary embolism brought to the ER with progressive confusion and weakness, difficulty walking for 10 days.  Recent MRI showed recurrence of left frontal meningioma at the resection site. She was admitted to the hospital, underwent different investigations to look for any secondary cause for encephalopathy and confusion. Followed by oncology, palliative. 1/14, decision made for comfort care. 1/17, remains a stable.  Waiting for inpatient hospice bed availability.   Does not have any family support at home to go home with home hospice.  It may help symptomatically, will treat her E. coli UTI. 1/19, remains in the hospital waiting for inpatient hospice bed.  Fairly comfortable and symptoms are managed. 1/20, patient has well-managed symptoms and currently stabilized.  Declined by residential hospice.  No enough support at home to go home with home hospice. Refer to SNF.   Assessment & Plan:   Active Problems:   Hyperlipidemia   Meningioma (HCC)   Acute metabolic encephalopathy   Acute encephalopathy   Epilepsia ([place])   SIRS (systemic inflammatory response syndrome) (HCC)   History of pulmonary embolus (PE)   Chronic diastolic CHF (congestive heart failure) (HCC)   End of life care  Acute encephalopathy due to recurrent meningioma and suspected seizure: Failure to thrive in adult History of pulmonary embolism Chronic diastolic heart failure End-of-life care Acute UTI present on admission due to E. coli  Plan: Full comfort measure DNR/DNI, MOST and DNR. Since patient did clinically well, declined by residential hospice.   All symptom management and end-of-life care orders  available, focus on keeping comfort. RN to pronounce death if happens in the hospital. No escalation of care. We will continue oral dexamethasone and Keppra, will consider it as comfort care medications. 1 dose of Rocephin given for E. coli UTI, will keep on 4 days of Keflex. Unable to go to residential hospice.  No family support available to go home. Work with PT OT today. Will be best served at skilled nursing facility or ALF.    DVT prophylaxis: Comfort care   Code Status: Comfort care Family Communication: Significant other, Mr. [name] called and updated by hospice team. Disposition Plan: Status is: Inpatient  Remains inpatient appropriate because:Unsafe d/c plan Patient needs placement.  No support at home to go home with home hospice.  Dispo: The patient is from: Home              Anticipated d/c is to: Unknown.              Anticipated d/c date is: Whenever bed available.              Patient currently is medically stable to d/c.        Consultants:   Neuro oncology  Neurology  Palliative  Procedures:   EEG  Antimicrobials:   Rocephin, 1/17 1 dose.  Keflex, 1/18----   Subjective: Patient seen and examined.  No overnight events.  Patient herself is more alert and awake today.  She knows that she cannot go home and she needs more support.  Objective: Vitals:   01/22/20 1403 01/22/20 1938 01/23/20 1257 01/23/20 1931  BP: 130/77 116/74 124/80 122/76  Pulse: 82 74 77 80  Resp:  18 19 18   Temp:  98.2 F (36.8 C) (!) 97.5 F (36.4 C) 98.1 F (36.7 C)  TempSrc:  Oral  Oral  SpO2:  95% 98% 96%  Weight:      Height:        Intake/Output Summary (Last 24 hours) at 01/24/2020 1246 Last data filed at 01/24/2020 0810 Gross per 24 hour  Intake 236 ml  Output 700 ml  Net -464 ml   Filed Weights   01/14/20 2200 01/16/20 0030  Weight: 76 kg 76 kg    Examination:  General exam: Appears calm and comfortable on room air.  Patient is alert and awake.   She is slow to respond, however oriented x1-2. Respiratory system: Clear to auscultation. Respiratory effort normal.  No added sounds. Cardiovascular system: S1 & S2 heard, RRR.  Gastrointestinal system: Soft.  Nontender. Skin: No rashes, lesions or ulcers Psychiatry: Judgement and insight appear compromised.  Affect is flat.  She has generalized weakness of all extremities.    Data Reviewed: I have personally reviewed following labs and imaging studies  CBC: Recent Labs  Lab 01/18/20 0328  WBC 13.9*  HGB 12.7  HCT 40.0  MCV 97.3  PLT 123456   Basic Metabolic Panel: No results for input(s): NA, K, CL, CO2, GLUCOSE, BUN, CREATININE, CALCIUM, MG, PHOS in the last 168 hours. GFR: Estimated Creatinine Clearance: 60.7 mL/min (by C-G formula based on SCr of 0.71 mg/dL). Liver Function Tests: No results for input(s): AST, ALT, ALKPHOS, BILITOT, PROT, ALBUMIN in the last 168 hours. No results for input(s): LIPASE, AMYLASE in the last 168 hours. No results for input(s): AMMONIA in the last 168 hours. Coagulation Profile: No results for input(s): INR, PROTIME in the last 168 hours. Cardiac Enzymes: No results for input(s): CKTOTAL, CKMB, CKMBINDEX, TROPONINI in the last 168 hours. BNP (last 3 results) Recent Labs    11/19/19 1512  PROBNP 96.0   HbA1C: No results for input(s): HGBA1C in the last 72 hours. CBG: No results for input(s): GLUCAP in the last 168 hours. Lipid Profile: No results for input(s): CHOL, HDL, LDLCALC, TRIG, CHOLHDL, LDLDIRECT in the last 72 hours. Thyroid Function Tests: No results for input(s): TSH, T4TOTAL, FREET4, T3FREE, THYROIDAB in the last 72 hours. Anemia Panel: No results for input(s): VITAMINB12, FOLATE, FERRITIN, TIBC, IRON, RETICCTPCT in the last 72 hours. Sepsis Labs: No results for input(s): PROCALCITON, LATICACIDVEN in the last 168 hours.  Recent Results (from the past 240 hour(s))  Blood culture (routine x 2)     Status: Abnormal    Collection Time: 01/14/20  6:29 PM   Specimen: BLOOD  Result Value Ref Range Status   Specimen Description BLOOD SITE NOT SPECIFIED  Final   Special Requests   Final    BOTTLES DRAWN AEROBIC AND ANAEROBIC Blood Culture adequate volume   Culture  Setup Time   Final    GRAM POSITIVE COCCI IN CLUSTERS AEROBIC BOTTLE ONLY CRITICAL RESULT CALLED TO, READ BACK BY AND VERIFIED WITH: Andres Shad PharmD 8:55 01/17/20 ([email or web address])    Culture (A)  Final    STAPHYLOCOCCUS CAPITIS THE SIGNIFICANCE OF ISOLATING THIS ORGANISM FROM A SINGLE SET OF BLOOD CULTURES WHEN MULTIPLE SETS ARE DRAWN IS UNCERTAIN. PLEASE NOTIFY THE MICROBIOLOGY DEPARTMENT WITHIN ONE WEEK IF SPECIATION AND SENSITIVITIES ARE REQUIRED.    Report Status 01/18/2020 FINAL  Final  Blood Culture ID Panel (Reflexed)     Status: Abnormal   Collection Time: 01/14/20  6:29 PM  Result Value Ref Range Status   Enterococcus faecalis NOT  DETECTED NOT DETECTED Final   Enterococcus Faecium NOT DETECTED NOT DETECTED Final   Listeria monocytogenes NOT DETECTED NOT DETECTED Final   Staphylococcus species DETECTED (A) NOT DETECTED Final    Comment: CRITICAL RESULT CALLED TO, READ BACK BY AND VERIFIED WITH: Andres Shad PharmD 8:55 01/17/20 ([email or web address])    Staphylococcus aureus (BCID) NOT DETECTED NOT DETECTED Final   Staphylococcus epidermidis NOT DETECTED NOT DETECTED Final   Staphylococcus lugdunensis NOT DETECTED NOT DETECTED Final   Streptococcus species NOT DETECTED NOT DETECTED Final   Streptococcus agalactiae NOT DETECTED NOT DETECTED Final   Streptococcus pneumoniae NOT DETECTED NOT DETECTED Final   Streptococcus pyogenes NOT DETECTED NOT DETECTED Final   A.calcoaceticus-baumannii NOT DETECTED NOT DETECTED Final   Bacteroides fragilis NOT DETECTED NOT DETECTED Final   Enterobacterales NOT DETECTED NOT DETECTED Final   Enterobacter cloacae complex NOT DETECTED NOT DETECTED Final   Escherichia coli NOT DETECTED NOT DETECTED Final   Klebsiella  aerogenes NOT DETECTED NOT DETECTED Final   Klebsiella oxytoca NOT DETECTED NOT DETECTED Final   Klebsiella pneumoniae NOT DETECTED NOT DETECTED Final   Proteus species NOT DETECTED NOT DETECTED Final   Salmonella species NOT DETECTED NOT DETECTED Final   Serratia marcescens NOT DETECTED NOT DETECTED Final   Haemophilus influenzae NOT DETECTED NOT DETECTED Final   Neisseria meningitidis NOT DETECTED NOT DETECTED Final   Pseudomonas aeruginosa NOT DETECTED NOT DETECTED Final   Stenotrophomonas maltophilia NOT DETECTED NOT DETECTED Final   Candida albicans NOT DETECTED NOT DETECTED Final   Candida auris NOT DETECTED NOT DETECTED Final   Candida glabrata NOT DETECTED NOT DETECTED Final   Candida krusei NOT DETECTED NOT DETECTED Final   Candida parapsilosis NOT DETECTED NOT DETECTED Final   Candida tropicalis NOT DETECTED NOT DETECTED Final   Cryptococcus neoformans/gattii NOT DETECTED NOT DETECTED Final    Comment: Performed at [facility], [address] [place], [place] [address]  Urine culture     Status: None   Collection Time: 01/14/20  7:34 PM   Specimen: Urine, Clean Catch  Result Value Ref Range Status   Specimen Description URINE, CLEAN CATCH  Final   Special Requests NONE  Final   Culture   Final    NO GROWTH Performed at [facility], [place] [address] [place], [place] [address]    Report Status 01/16/2020 FINAL  Final  Resp Panel by RT-PCR (Flu A&B, Covid) Nasopharyngeal Swab     Status: None   Collection Time: 01/14/20  8:11 PM   Specimen: Nasopharyngeal Swab; Nasopharyngeal(NP) swabs in vial transport medium  Result Value Ref Range Status   SARS Coronavirus 2 by RT PCR NEGATIVE NEGATIVE Final    Comment: (NOTE) SARS-CoV-2 target nucleic acids are NOT DETECTED.  The SARS-CoV-2 RNA is generally detectable in upper respiratory specimens during the acute phase of infection. The lowest concentration of SARS-CoV-2 viral copies this assay can detect is 138  copies/mL. A negative result does not preclude SARS-Cov-2 infection and should not be used as the sole basis for treatment or other patient management decisions. A negative result may occur with  improper specimen collection/handling, submission of specimen other than nasopharyngeal swab, presence of viral mutation(s) within the areas targeted by this assay, and inadequate number of viral copies(<138 copies/mL). A negative result must be combined with clinical observations, patient history, and epidemiological information. The expected result is Negative.  Fact Sheet for Patients:  EntrepreneurPulse.com.au  Fact Sheet for Healthcare Providers:  IncredibleEmployment.be  This  test is no t yet approved or cleared by the [place] and  has been authorized for detection and/or diagnosis of SARS-CoV-2 by FDA under an Emergency Use Authorization (EUA). This EUA will remain  in effect (meaning this test can be used) for the duration of the COVID-19 declaration under Section 564(b)(1) of the Act, 21 U.S.C.section 360bbb-3(b)(1), unless the authorization is terminated  or revoked sooner.       Influenza A by PCR NEGATIVE NEGATIVE Final   Influenza B by PCR NEGATIVE NEGATIVE Final    Comment: (NOTE) The Xpert Xpress SARS-CoV-2/FLU/RSV plus assay is intended as an aid in the diagnosis of influenza from Nasopharyngeal swab specimens and should not be used as a sole basis for treatment. Nasal washings and aspirates are unacceptable for Xpert Xpress SARS-CoV-2/FLU/RSV testing.  Fact Sheet for Patients: EntrepreneurPulse.com.au  Fact Sheet for Healthcare Providers: IncredibleEmployment.be  This test is not yet approved or cleared by the [place] FDA and has been authorized for detection and/or diagnosis of SARS-CoV-2 by FDA under an Emergency Use Authorization (EUA). This EUA will remain in effect (meaning  this test can be used) for the duration of the COVID-19 declaration under Section 564(b)(1) of the Act, 21 U.S.C. section 360bbb-3(b)(1), unless the authorization is terminated or revoked.  Performed at [facility], [place] [address], [place], [place] [address]   Culture, Urine     Status: Abnormal   Collection Time: 01/18/20  5:30 AM   Specimen: Urine, Random  Result Value Ref Range Status   Specimen Description URINE, RANDOM  Final   Special Requests   Final    NONE Performed at [facility], [place] [address], [place], [place] [address]    Culture >=100,000 COLONIES/mL ESCHERICHIA COLI (A)  Final   Report Status 01/21/2020 FINAL  Final   Organism ID, Bacteria ESCHERICHIA COLI (A)  Final      Susceptibility   Escherichia coli - MIC*    AMPICILLIN <=2 SENSITIVE Sensitive     CEFAZOLIN <=4 SENSITIVE Sensitive     CEFEPIME <=0.12 SENSITIVE Sensitive     CEFTRIAXONE <=0.25 SENSITIVE Sensitive     CIPROFLOXACIN <=0.25 SENSITIVE Sensitive     GENTAMICIN <=1 SENSITIVE Sensitive     IMIPENEM <=0.25 SENSITIVE Sensitive     NITROFURANTOIN <=16 SENSITIVE Sensitive     TRIMETH/SULFA <=20 SENSITIVE Sensitive     AMPICILLIN/SULBACTAM <=2 SENSITIVE Sensitive     PIP/TAZO <=4 SENSITIVE Sensitive     * >=100,000 COLONIES/mL ESCHERICHIA COLI         Radiology Studies: No results found.      Scheduled Meds: . cephALEXin  500 mg Oral Q8H  . dexamethasone  4 mg Oral Q12H  . levETIRAcetam  1,500 mg Oral BID  . morphine CONCENTRATE  5 mg Sublingual Q6H   Continuous Infusions:    LOS: 10 days    Time spent: 25 minutes    Barb Merino, MD [facility] Pager [phone]

## 2020-01-24 NOTE — Evaluation (Signed)
Physical Therapy Evaluation Patient Details Name: [name] [name] MRN: [id number] DOB: Jan 06, 1945 Today's Date: 01/24/2020   History of Present Illness  75 year old female with history of left frontal meningioma and cavernous sinus meningioma s/p resection, on steroids and Keppra, hypertension hyperlipidemia, recent history of pulmonary embolism brought to the ER with progressive confusion and weakness, difficulty walking for 10 days.  Recent MRI showed recurrence of left frontal meningioma at the resection site. Pt made comfort care on 1/14, has remained stable and was denied residential hospice 1/20.  Clinical Impression  Pt presents to PT with deficits in functional mobility, gait, balance, cognition, strength, power. Pt requires physical assistance for all functional mobility and to maintain sitting balance due to posterior lean. Pt with delayed processing and requires multiple PT cues to follow commands. Pt is at a high risk for falls due to altered cognition and impaired mobility and strength. Pt will benefit from continued acute PT POC to improve mobility quality and to reduce caregiver burden. PT recommends discharge to SNF.    Follow Up Recommendations SNF;Supervision/Assistance - 24 hour    Equipment Recommendations  Wheelchair (measurements PT);Hospital bed    Recommendations for Other Services       Precautions / Restrictions Precautions Precautions: Fall Precaution Comments: seizure precautions Restrictions Weight Bearing Restrictions: No      Mobility  Bed Mobility Overal bed mobility: Needs Assistance Bed Mobility: Supine to Sit     Supine to sit: Max assist;HOB elevated     General bed mobility comments: pt requires assistance to mobilize LE to edge of bed, is able to assist in pulling trunk into sitting with LUE support    Transfers Overall transfer level: Needs assistance Equipment used: 1 person hand held assist Transfers: Sit to/from [employer]  Transfers Sit to Stand: Mod assist Stand pivot transfers: Mod assist       General transfer comment: pt requires BUE support  Ambulation/Gait                Stairs            Wheelchair Mobility    Modified Rankin (Stroke Patients Only)       Balance Overall balance assessment: Needs assistance Sitting-balance support: No upper extremity supported;Feet supported Sitting balance-Leahy Scale: Poor Sitting balance - Comments: modA due to posterior lean Postural control: Posterior lean Standing balance support: Bilateral upper extremity supported Standing balance-Leahy Scale: Poor Standing balance comment: modA with BUE support of PT hand hold                             Pertinent Vitals/Pain Pain Assessment: Faces Faces Pain Scale: No hurt    Home Living Family/patient expects to be discharged to:: Skilled nursing facility Living Arrangements: Spouse/significant other Available Help at Discharge: Family;Available 24 hours/day Type of Home: House Home Access: Stairs to enter Entrance Stairs-Rails: None Entrance Stairs-Number of Steps: 3 Home Layout: Two level Home Equipment: Walker - 2 wheels      Prior Function Level of Independence: Needs assistance   Gait / Transfers Assistance Needed: requiring use of RWrecently prior to admission  ADL's / Homemaking Assistance Needed: has significant other to assist all care with sharp decline in week prior to admission. Pt typically able to feed self, requires assist for bathing, dressing        Hand Dominance   Dominant Hand: Right    Extremity/Trunk Assessment   Upper Extremity Assessment Upper Extremity Assessment:  RUE deficits/detail RUE Deficits / Details: history of R weakness, pt having difficulty following commands for formal MMT is able to mobilize RUE at 3/5 level during functional mobility    Lower Extremity Assessment Lower Extremity Assessment: RLE deficits/detail RLE Deficits  / Details: grossly 3+/5 during functional mobility    Cervical / Trunk Assessment Cervical / Trunk Assessment: Normal  Communication   Communication: Receptive difficulties;Expressive difficulties (very slowed processing)  Cognition Arousal/Alertness: Awake/alert Behavior During Therapy: Flat affect Overall Cognitive Status: Impaired/Different from baseline Area of Impairment: Orientation;Attention;Memory;Following commands;Safety/judgement;Awareness;Problem solving                 Orientation Level: Disoriented to;Time;Situation Current Attention Level: Focused Memory: Decreased recall of precautions;Decreased short-term memory Following Commands: Follows one step commands with increased time Safety/Judgement: Decreased awareness of safety;Decreased awareness of deficits Awareness: Intellectual Problem Solving: Slow processing;Requires tactile cues;Decreased initiation;Difficulty sequencing;Requires verbal cues        General Comments General comments (skin integrity, edema, etc.): VSS on RA    Exercises     Assessment/Plan    PT Assessment Patient needs continued PT services  PT Problem List Decreased strength;Decreased range of motion;Decreased activity tolerance;Decreased balance;Decreased mobility;Decreased coordination;Decreased cognition;Decreased knowledge of use of DME;Decreased safety awareness;Cardiopulmonary status limiting activity       PT Treatment Interventions DME instruction;Gait training;Functional mobility training;Therapeutic activities;Therapeutic exercise;Balance training;Neuromuscular re-education;Cognitive remediation;Patient/family education;Wheelchair mobility training    PT Goals (Current goals can be found in the Care Plan section)  Acute Rehab PT Goals Patient Stated Goal: to discharge to SNF for increased caregiver support PT Goal Formulation: With family Time For Goal Achievement: 02/07/20 Potential to Achieve Goals: Fair     Frequency Min 2X/week (comfort care, seen for placement)   Barriers to discharge        Co-evaluation               AM-PAC PT "6 Clicks" Mobility  Outcome Measure Help needed turning from your back to your side while in a flat bed without using bedrails?: A Lot Help needed moving from lying on your back to sitting on the side of a flat bed without using bedrails?: A Lot Help needed moving to and from a bed to a chair (including a wheelchair)?: A Lot Help needed standing up from a chair using your arms (e.g., wheelchair or bedside chair)?: A Lot Help needed to walk in hospital room?: A Lot Help needed climbing 3-5 steps with a railing? : Total 6 Click Score: 11    End of Session   Activity Tolerance: Patient limited by fatigue Patient left: in chair;with call bell/phone within reach;with chair alarm set;with family/visitor present Nurse Communication: Mobility status PT Visit Diagnosis: Muscle weakness (generalized) (M62.81);Other abnormalities of gait and mobility (R26.89);Difficulty in walking, not elsewhere classified (R26.2);Unsteadiness on feet (R26.81)    Time: 1412-1430 PT Time Calculation (min) (ACUTE ONLY): 18 min   Charges:   PT Evaluation $PT Eval Moderate Complexity: 1 Mod          Zenaida Niece, PT, DPT Acute Rehabilitation Pager: [phone]   Zenaida Niece 01/24/2020, 4:03 PM

## 2020-01-24 NOTE — NC FL2 (Signed)
[place] LEVEL OF CARE SCREENING TOOL     IDENTIFICATION  Patient Name: [name] [name] Birthdate: Dec 24, 1945 Sex: female Admission Date (Current Location): 01/14/2020  [facility] and [place] Number:  Herbalist and Address:  [facility], [place] [address], [employer], [place] [address]      Provider Number: [id number]  Attending Physician Name and Address:  Barb Merino, MD  Relative Name and Phone Number:  Eller,Carl Significant other [phone]    Current Level of Care: Hospital Recommended Level of Care: [place] Prior Approval Number:    Date Approved/Denied:   PASRR Number: [id number]  Discharge Plan: SNF    Current Diagnoses: Patient Active Problem List   Diagnosis Date Noted  . End of life care 01/19/2020  . Acute encephalopathy 01/14/2020  . 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 SIRS (systemic inflammatory response syndrome) ([place]) 01/14/2020  . History of pulmonary embolus (PE) 01/14/2020  . Chronic diastolic CHF (congestive heart failure) ([place]) 01/14/2020  . Focal seizure ([place]) 12/03/2019  . Brain tumor (benign) ([place]) 11/09/2019  . Acute metabolic encephalopathy 62/22/9798  . AMS (altered mental status) 10/22/2019  . Meningioma, cerebral ([place]) 12/22/2018  . Acute pulmonary embolism ([place]) 09/28/2017  . Dyspnea on exertion 09/28/2017  . Hypoxia 09/28/2017  . Meningioma ([place]) 09/07/2017  . FATIGUE 09/12/2009  . MYALGIA 10/31/2008  . Exertional dyspnea 10/31/2008  . Hyperlipidemia 11/11/2006  . OTHER SPECIFIED ANEMIAS 11/11/2006  . GERD 11/11/2006  . Osteoporosis 11/11/2006  . IRRITABLE BOWEL SYNDROME, HX OF 10/20/2006    Orientation RESPIRATION BLADDER Height & Weight     Self  Normal Incontinent Weight: 167 lb 8.8 oz (76 kg) Height:  5\' 2"  (157.5 cm)  BEHAVIORAL SYMPTOMS/MOOD NEUROLOGICAL BOWEL NUTRITION STATUS      Incontinent Diet (Regular diet.  See discharge summary)  AMBULATORY STATUS  COMMUNICATION OF NEEDS Skin   Limited Assist Verbally Normal                       Personal Care Assistance Level of Assistance  Bathing,Feeding,Dressing Bathing Assistance: Maximum assistance Feeding assistance: Maximum assistance Dressing Assistance: Maximum assistance     Functional Limitations Info  Sight,Hearing,Speech Sight Info: Adequate Hearing Info: Adequate Speech Info: Adequate    SPECIAL CARE FACTORS FREQUENCY  PT (By licensed PT),OT (By licensed OT)     PT Frequency: 5x week OT Frequency: 5x week            Contractures Contractures Info: Not present    Additional Factors Info  Code Status,Allergies Code Status Info: DNR Allergies Info: Bee Venom, Losartan           Current Medications (01/24/2020):  This is the current hospital active medication list Current Facility-Administered Medications  Medication Dose Route Frequency Provider Last Rate Last Admin  . acetaminophen (TYLENOL) tablet 650 mg  650 mg Oral Q6H PRN Toy Baker, MD       Or  . acetaminophen (TYLENOL) suppository 650 mg  650 mg Rectal Q6H PRN Doutova, Anastassia, MD      . antiseptic oral rinse (BIOTENE) solution 15 mL  15 mL Topical PRN McIlquham, Julia B, NP      . cephALEXin (KEFLEX) capsule 500 mg  500 mg Oral Q8H Barb Merino, MD   500 mg at 01/24/20 1336  . dexamethasone (DECADRON) tablet 4 mg  4 mg Oral Q12H Barb Merino, MD   4 mg at 01/24/20 0955  . glycopyrrolate (ROBINUL) injection  0.2 mg  0.2 mg Subcutaneous Q4H PRN Elie Confer B, NP       Or  . glycopyrrolate (ROBINUL) injection 0.2 mg  0.2 mg Intravenous Q4H PRN Elie Confer B, NP      . hydrALAZINE (APRESOLINE) injection 10 mg  10 mg Intravenous Q6H PRN Darliss Cheney, MD      . levETIRAcetam (KEPPRA) 100 MG/ML solution 1,500 mg  1,500 mg Oral BID Bhagat, Srishti L, MD   1,500 mg at 01/24/20 0955  . LORazepam (ATIVAN) injection 1 mg  1 mg Intravenous Q4H PRN Lane Hacker L, DO      .  morphine CONCENTRATE 10 MG/0.5ML oral solution 10 mg  10 mg Oral Q30 min PRN Lane Hacker L, DO      . morphine CONCENTRATE 10 MG/0.5ML oral solution 5 mg  5 mg Sublingual Q6H McIlquham, Julia B, NP   5 mg at 01/24/20 1223  . ondansetron (ZOFRAN) tablet 4 mg  4 mg Oral Q6H PRN Toy Baker, MD       Or  . ondansetron (ZOFRAN) injection 4 mg  4 mg Intravenous Q6H PRN Doutova, Anastassia, MD      . polyvinyl alcohol (LIQUIFILM TEARS) 1.4 % ophthalmic solution 1 drop  1 drop Both Eyes QID PRN Lavena Bullion, NP         Discharge Medications: Please see discharge summary for a list of discharge medications.  Relevant Imaging Results:  Relevant Lab Results:   Additional Information SS# [id number]  Joanne Chars, LCSW

## 2020-01-24 NOTE — Progress Notes (Signed)
   [place] called this am to me and stated he has had second thoughts about bringing her home he has talked with his children and he feels it is just to  Much. He knows that yesterday he stated that he wanted to move forward with this but now he feels he is just going to have to look at a facility for her to go to with hospice care.  He understands that he will have to pay out of pocket if she does not do any rehab.  I have cancelled equipment order at this time.   Please reach out if we can assist or if they change there minds again. I will follow remotely to see where the pt lands and we can serve the pt at a facility if this is there wishes.   Webb Silversmith RN [phone]

## 2020-01-25 NOTE — TOC Progression Note (Signed)
Transition of Care [facility]) - Progression Note    Patient Details  Name: [name] [name] MRN: [id number] Date of Birth: 16-Sep-1945  Transition of [facility]) CM/SW Contact  Joanne Chars, LCSW Phone Number: 01/25/2020, 9:42 AM  Clinical Narrative:  CSW confirmed with Cassandra at [facility] that she had received referral.  She will review today.      Expected Discharge Plan: [place]    Expected Discharge Plan and Services Expected Discharge Plan: [place]                                               Social Determinants of Health (SDOH) Interventions    Readmission Risk Interventions Readmission Risk Prevention Plan 11/13/2019  Post Dischage Appt Complete  Medication Screening Complete  Transportation Screening Complete  Some recent data might be hidden

## 2020-01-25 NOTE — Evaluation (Signed)
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  Recent MRI showed recurrence of left frontal meningioma at the resection site. Pt made comfort care on 1/14, has remained stable and was denied residential hospice 1/20.   Clinical Impression   PTA, pt was living with her husband and requiring assistance for BADLs. Pt currently requiring Max ADLs and Mod A functional transfers. Pt presenting with decreased cognition, balance, strength, and activity tolerance. Pt with significant fatigue and requiring increased time throughout session. Pt would benefit from further acute OT to facilitate safe dc. Recommend dc to SNF for further OT to optimize safety, independence with ADLs, and return to PLOF.     Follow Up Recommendations  SNF    Equipment Recommendations  Wheelchair (measurements OT);Wheelchair cushion (measurements OT)    Recommendations for Other Services PT consult     Precautions / Restrictions Precautions Precautions: Fall Precaution Comments: seizure precautions      Mobility Bed Mobility Overal bed mobility: Needs Assistance Bed Mobility: Supine to Sit     Supine to sit: Max assist;HOB elevated     General bed mobility comments: Max A to bring BLES towards EOB and then elevating trunk    Transfers Overall transfer level: Needs assistance Equipment used: 1 person hand held assist Transfers: Sit to/from [facility] Sit to Stand: Mod assist Stand pivot transfers: Mod assist       General transfer comment: Mod A to power up into standing and then maintain balance  in pivot to recliner    Balance Overall balance assessment: Needs assistance Sitting-balance support: Feet supported;No upper extremity supported Sitting balance-Leahy Scale: Fair     Standing balance support: Single extremity supported;During functional activity Standing balance-Leahy Scale: Poor Standing balance comment: Reliant on UE support and physical A                           ADL either performed or assessed with clinical judgement   ADL Overall ADL's : Needs assistance/impaired Eating/Feeding: Maximal assistance;Bed level   Grooming: Minimal assistance;Sitting                                 General ADL Comments: Max A for ADLs and bed mobility due to cognition and fatigue. Mod A for functional transfer.     Vision         Perception     Praxis      Pertinent Vitals/Pain Pain Assessment: Faces Faces Pain Scale: No hurt Pain Intervention(s): Monitored during session;Limited activity within patient's tolerance;Repositioned     Hand Dominance Right   Extremity/Trunk Assessment Upper Extremity Assessment Upper Extremity Assessment: RUE deficits/detail RUE Deficits / Details: Hx of right sided weakness. Decreased coorindation and strength RUE Coordination: decreased fine motor;decreased gross motor LUE Deficits / Details: Decreased coorindation and strength LUE Coordination: decreased fine motor;decreased gross motor   Lower Extremity Assessment Lower Extremity Assessment: Defer to PT evaluation   Cervical / Trunk Assessment Cervical / Trunk Assessment: Normal   Communication Communication Communication: Receptive difficulties;Expressive difficulties (Soft spoken)   Cognition Arousal/Alertness: Awake/alert Behavior During Therapy:  Flat affect Overall Cognitive Status: Impaired/Different from baseline Area of Impairment: Orientation;Attention;Memory;Following commands;Safety/judgement;Awareness;Problem solving                  Orientation Level: Disoriented to;Time;Situation Current Attention Level: Focused Memory: Decreased short-term memory;Decreased recall of precautions Following Commands: Follows one step commands with increased time Safety/Judgement: Decreased awareness of safety;Decreased awareness of deficits Awareness: Intellectual Problem Solving: Slow processing;Requires tactile cues;Decreased initiation;Difficulty sequencing;Requires verbal cues     General Comments  SpO2 98% on RA    Exercises     Shoulder Instructions      Home Living Family/patient expects to be discharged to:: Skilled nursing facility Living Arrangements: Spouse/significant other Available Help at Discharge: Family;Available 24 hours/day Type of Home: House Home Access: Stairs to enter [employer] of Steps: 3 Entrance Stairs-Rails: None Home Layout: Two level Alternate Level Stairs-Number of Steps: flight Alternate Level Stairs-Rails: Left;Right Bathroom Shower/Tub: Occupational psychologist: Standard Bathroom Accessibility: Yes   Home Equipment: Environmental consultant - 2 wheels          Prior Functioning/Environment Level of Independence: Needs assistance  Gait / Transfers Assistance Needed: requiring use of RWrecently prior to admission ADL's / Homemaking Assistance Needed: has significant other to assist all care with sharp decline in week prior to admission. Pt typically able to feed self, requires assist for bathing, dressing            OT Problem List: Decreased strength;Decreased activity tolerance;Impaired balance (sitting and/or standing);Decreased coordination;Decreased cognition;Decreased safety awareness;Decreased knowledge of use of DME or AE;Impaired UE functional use      OT Treatment/Interventions: Self-care/ADL training;Therapeutic exercise;DME and/or AE instruction;Therapeutic activities;Patient/family education;Balance training    OT Goals(Current goals can be found in the  care plan section) Acute Rehab OT Goals Patient Stated Goal: to discharge to SNF for increased caregiver support OT Goal Formulation: With patient Time For Goal Achievement: 02/08/20 Potential to Achieve Goals: Good  OT Frequency: Min 2X/week   Barriers to D/C:            Co-evaluation              AM-PAC OT "6 Clicks" Daily Activity     Outcome Measure Help from another person eating meals?: A Lot Help from another person taking care of personal grooming?: A Little Help from another person toileting, which includes using toliet, bedpan, or urinal?: A Lot Help from another person bathing (including washing, rinsing, drying)?: A Lot Help from another person to put on and taking off regular upper body clothing?: A Lot Help from another person to put on and taking off regular lower body clothing?: Total 6 Click Score: 12   End of Session Equipment Utilized During Treatment: Gait belt;Rolling walker Nurse Communication: Mobility status  Activity Tolerance: Patient limited by fatigue;Patient limited by lethargy Patient left: in chair;with call bell/phone within reach;with chair alarm set  OT Visit Diagnosis: Unsteadiness on feet (R26.81);Other abnormalities of gait and mobility (R26.89);Muscle weakness (generalized) (M62.81);Apraxia (R48.2);Ataxia, unspecified (R27.0);Other symptoms and signs involving cognitive function                Time: 6761-9509 OT Time Calculation (min): 19 min Charges:  OT General Charges $OT Visit: 1 Visit OT Evaluation $OT Eval Moderate Complexity: [place], OTR/L Acute Rehab Pager: [phone] Office: [place] 01/25/2020, 5:10 PM

## 2020-01-25 NOTE — Progress Notes (Signed)
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  Recent MRI showed recurrence of left frontal meningioma at the resection site. She was admitted to the hospital, underwent different investigations to look for any secondary cause for encephalopathy and confusion. Followed by oncology, palliative. 1/14, decision made for comfort care. 1/17, remains a stable.  Waiting for inpatient hospice bed availability.   Does not have any family support at home to go home with home hospice.  It may help symptomatically, will treat her E. coli UTI. 1/19, remains in the hospital waiting for inpatient hospice bed.  Fairly comfortable and symptoms are managed. 1/20, patient has well-managed symptoms and currently stabilized.  Declined by residential hospice.  No enough support at home to go home with home hospice. Referred to SNF.   Assessment & Plan:   Active Problems:   Hyperlipidemia   Meningioma (HCC)   Acute metabolic encephalopathy   Acute encephalopathy   Epilepsia ([place])   SIRS (systemic inflammatory response syndrome) (HCC)   History of pulmonary embolus (PE)   Chronic diastolic CHF (congestive heart failure) (HCC)   End of life care  Acute encephalopathy due to recurrent meningioma and suspected seizure: Clinically stabilizing. Failure to thrive in adult, ongoing issue. History of pulmonary embolism, not on anticoagulation. Chronic diastolic heart failure, euvolemic. End-of-life care Acute UTI present on admission due to E. coli  Plan: Comfort care. DNR/DNI, MOST and DNR available. Since patient did clinically well,  declined by residential hospice.   All symptom management and end-of-life care orders available, focus on keeping comfort. RN to pronounce death if happens in the hospital. No escalation of care. We will continue oral dexamethasone and Keppra, will consider it as comfort care medications. 1 dose of Rocephin given for E. coli UTI, treated with 5 days of antibiotic therapy. Unable to go to residential hospice.  No family support available to go home. Work with PT OT today. Will be best served at skilled nursing facility or ALF. She is fairly stable now.    DVT prophylaxis: Comfort care   Code Status: Comfort care Family Communication: Significant other, Mr. [name] called and updated by hospice team. Disposition Plan: Status is: Inpatient  Remains inpatient appropriate because:Unsafe d/c plan Patient needs placement.  No support at home to go home with home hospice.  Dispo: The patient is from: Home              Anticipated d/c is to: Unknown.              Anticipated d/c date is: Whenever bed available.              Patient currently is medically stable to d/c.        Consultants:   Neuro oncology  Neurology  Palliative  Procedures:   EEG  Antimicrobials:   Rocephin, 1/17 1 dose.  Keflex, 1/18----   Subjective: Patient seen and examined.  No overnight events.  Denies any complaints.  She is more awake now.  She is more aware about the environment.  She is still unable to recall what type of cancer she has.  Objective: Vitals:   01/23/20 1257 01/23/20 1931 01/24/20 1500  01/24/20 2225  BP: 124/80 122/76 135/90 (!) 141/87  Pulse: 77 80 71 63  Resp: 19 18 18 20   Temp: (!) 97.5 F (36.4 C) 98.1 F (36.7 C) 97.7 F (36.5 C) 98.4 F (36.9 C)  TempSrc:  Oral Oral Oral  SpO2: 98% 96% 97% 98%  Weight:      Height:        Intake/Output Summary (Last 24 hours) at 01/25/2020 1058 Last data filed at 01/25/2020 0500 Gross per 24 hour  Intake 360 ml  Output 1000 ml   Net -640 ml   Filed Weights   01/14/20 2200 01/16/20 0030  Weight: 76 kg 76 kg    Examination:  General exam: Appears calm and comfortable on room air.  Patient is alert and awake.  She is slow to respond, however oriented x1-2. Respiratory system: Clear to auscultation. Respiratory effort normal.  No added sounds. Cardiovascular system: S1 & S2 heard, RRR.  Gastrointestinal system: Soft.  Nontender. Skin: No rashes, lesions or ulcers Psychiatry: Judgement and insight appear compromised.  Affect is flat.  She has generalized weakness of all extremities.  No focal weakness.    Data Reviewed: I have personally reviewed following labs and imaging studies  CBC: No results for input(s): WBC, NEUTROABS, HGB, HCT, MCV, PLT in the last 168 hours. Basic Metabolic Panel: No results for input(s): NA, K, CL, CO2, GLUCOSE, BUN, CREATININE, CALCIUM, MG, PHOS in the last 168 hours. GFR: Estimated Creatinine Clearance: 60.7 mL/min (by C-G formula based on SCr of 0.71 mg/dL). Liver Function Tests: No results for input(s): AST, ALT, ALKPHOS, BILITOT, PROT, ALBUMIN in the last 168 hours. No results for input(s): LIPASE, AMYLASE in the last 168 hours. No results for input(s): AMMONIA in the last 168 hours. Coagulation Profile: No results for input(s): INR, PROTIME in the last 168 hours. Cardiac Enzymes: No results for input(s): CKTOTAL, CKMB, CKMBINDEX, TROPONINI in the last 168 hours. BNP (last 3 results) Recent Labs    11/19/19 1512  PROBNP 96.0   HbA1C: No results for input(s): HGBA1C in the last 72 hours. CBG: No results for input(s): GLUCAP in the last 168 hours. Lipid Profile: No results for input(s): CHOL, HDL, LDLCALC, TRIG, CHOLHDL, LDLDIRECT in the last 72 hours. Thyroid Function Tests: No results for input(s): TSH, T4TOTAL, FREET4, T3FREE, THYROIDAB in the last 72 hours. Anemia Panel: No results for input(s): VITAMINB12, FOLATE, FERRITIN, TIBC, IRON, RETICCTPCT in the last  72 hours. Sepsis Labs: No results for input(s): PROCALCITON, LATICACIDVEN in the last 168 hours.  Recent Results (from the past 240 hour(s))  Culture, Urine     Status: Abnormal   Collection Time: 01/18/20  5:30 AM   Specimen: Urine, Random  Result Value Ref Range Status   Specimen Description URINE, RANDOM  Final   Special Requests   Final    NONE Performed at [facility], [address], [place], [place] [address]    Culture >=100,000 COLONIES/mL ESCHERICHIA COLI (A)  Final   Report Status 01/21/2020 FINAL  Final   Organism ID, Bacteria ESCHERICHIA COLI (A)  Final      Susceptibility   Escherichia coli - MIC*    AMPICILLIN <=2 SENSITIVE Sensitive     CEFAZOLIN <=4 SENSITIVE Sensitive     CEFEPIME <=0.12 SENSITIVE Sensitive     CEFTRIAXONE <=0.25 SENSITIVE Sensitive     CIPROFLOXACIN <=0.25 SENSITIVE Sensitive     GENTAMICIN <=1 SENSITIVE Sensitive     IMIPENEM <=0.25 SENSITIVE Sensitive  NITROFURANTOIN <=16 SENSITIVE Sensitive     TRIMETH/SULFA <=20 SENSITIVE Sensitive     AMPICILLIN/SULBACTAM <=2 SENSITIVE Sensitive     PIP/TAZO <=4 SENSITIVE Sensitive     * >=100,000 COLONIES/mL ESCHERICHIA COLI         Radiology Studies: No results found.      Scheduled Meds:  cephALEXin  500 mg Oral Q8H   dexamethasone  4 mg Oral Q12H   levETIRAcetam  1,500 mg Oral BID   morphine CONCENTRATE  5 mg Sublingual Q6H   Continuous Infusions:    LOS: 11 days    Time spent: 25 minutes    Barb Merino, MD [facility] Pager [phone]

## 2020-01-26 NOTE — Progress Notes (Signed)
PROGRESS NOTE    [name] [name]  DXI:[id number] DOB: [birth year] DOA: 01/14/2020 PCP: Darreld Mclean, MD    Brief Narrative:  75 year old female with history of left frontal meningioma and cavernous sinus meningioma s/p resection, on steroids and Keppra, hypertension hyperlipidemia, recent history of pulmonary embolism brought to the ER with progressive confusion and weakness, difficulty walking for 10 days.  Recent MRI showed recurrence of left frontal meningioma at the resection site. She was admitted to the hospital, underwent different investigations to look for any secondary cause for encephalopathy and confusion. Followed by oncology, palliative. 1/14, decision made for comfort care. 1/17, remains a stable.  Waiting for inpatient hospice bed availability.   Does not have any family support at home to go home with home hospice.  It may help symptomatically, will treat her E. coli UTI. 1/19, remains in the hospital waiting for inpatient hospice bed.  Fairly comfortable and symptoms are managed. 1/20, patient has well-managed symptoms and currently stabilized.  Declined by residential hospice.  No enough support at home to go home with home hospice. Referred to SNF.   Assessment & Plan:   Active Problems:   Hyperlipidemia   Meningioma (HCC)   Acute metabolic encephalopathy   Acute encephalopathy   Epilepsia ([place])   SIRS (systemic inflammatory response syndrome) (HCC)   History of pulmonary embolus (PE)   Chronic diastolic CHF (congestive heart failure) (HCC)   End of life care  Acute encephalopathy due to recurrent meningioma and suspected seizure: Clinically stabilizing. Failure to thrive in adult, ongoing issue. History of pulmonary embolism, not on anticoagulation. Chronic diastolic heart failure, euvolemic. End-of-life care Acute UTI present on admission due to E. coli  Plan: Comfort care. DNR/DNI, MOST and DNR available. Since patient did clinically well,  declined by residential hospice.   All symptom management and end-of-life care orders available, focus on keeping comfort. RN to pronounce death if happens in the hospital. No escalation of care. We will continue oral dexamethasone and Keppra, will consider it as comfort care medications. 1 dose of Rocephin given for E. coli UTI, treated with 5 days of antibiotic therapy. Unable to go to residential hospice.  No family support available to go home. Work with PT OT . Plan is to discharge to SNF with PT/OT to attempt rehab , if not doing well to go to hospice.    DVT prophylaxis: Comfort care   Code Status: Comfort care Family Communication: Significant other, Mr. [name] called and updated by hospice team. Disposition Plan: Status is: Inpatient  Remains inpatient appropriate because:Unsafe d/c plan Patient needs placement.  No support at home to go home with home hospice.  Dispo: The patient is from: Home              Anticipated d/c is to: Unknown. snf or inpatient hospice.              Anticipated d/c date is: Whenever bed available.              Patient currently is medically stable to d/c.        Consultants:   Neuro oncology  Neurology  Palliative  Procedures:   EEG  Antimicrobials:   Rocephin, 1/17 1 dose.  Keflex, 1/18----1/22   Subjective: Patient seen and examined.  No overnight events.  Does not have appetite.  Remains comfortable, withdrawn.  Objective: Vitals:   01/24/20 1500 01/24/20 2225 01/25/20 1506 01/25/20 2130  BP: 135/90 (!) 141/87 121/84 140/90  Pulse: 71 63  79 68  Resp: 18 20 20 20   Temp: 97.7 F (36.5 C) 98.4 F (36.9 C) 98.5 F (36.9 C) 97.6 F (36.4 C)  TempSrc: Oral Oral    SpO2: 97% 98% 97% 98%  Weight:      Height:        Intake/Output Summary (Last 24 hours) at 01/26/2020 1155 Last data filed at 01/26/2020 0530 Gross per 24 hour  Intake -  Output 500 ml  Net -500 ml   Filed Weights   01/14/20 2200 01/16/20 0030   Weight: 76 kg 76 kg    Examination:  General exam: Appears calm and comfortable on room air.  Patient is alert and awake.  She is slow to respond, however oriented x1-2. Respiratory system: Clear to auscultation. Respiratory effort normal.  No added sounds. Cardiovascular system: S1 & S2 heard, RRR.  Gastrointestinal system: Soft.  Nontender. Skin: No rashes, lesions or ulcers Psychiatry: Judgement and insight appear compromised.  Affect is flat.  She has generalized weakness of all extremities.  No focal weakness.    Data Reviewed: I have personally reviewed following labs and imaging studies  CBC: No results for input(s): WBC, NEUTROABS, HGB, HCT, MCV, PLT in the last 168 hours. Basic Metabolic Panel: No results for input(s): NA, K, CL, CO2, GLUCOSE, BUN, CREATININE, CALCIUM, MG, PHOS in the last 168 hours. GFR: Estimated Creatinine Clearance: 60.7 mL/min (by C-G formula based on SCr of 0.71 mg/dL). Liver Function Tests: No results for input(s): AST, ALT, ALKPHOS, BILITOT, PROT, ALBUMIN in the last 168 hours. No results for input(s): LIPASE, AMYLASE in the last 168 hours. No results for input(s): AMMONIA in the last 168 hours. Coagulation Profile: No results for input(s): INR, PROTIME in the last 168 hours. Cardiac Enzymes: No results for input(s): CKTOTAL, CKMB, CKMBINDEX, TROPONINI in the last 168 hours. BNP (last 3 results) Recent Labs    11/19/19 1512  PROBNP 96.0   HbA1C: No results for input(s): HGBA1C in the last 72 hours. CBG: No results for input(s): GLUCAP in the last 168 hours. Lipid Profile: No results for input(s): CHOL, HDL, LDLCALC, TRIG, CHOLHDL, LDLDIRECT in the last 72 hours. Thyroid Function Tests: No results for input(s): TSH, T4TOTAL, FREET4, T3FREE, THYROIDAB in the last 72 hours. Anemia Panel: No results for input(s): VITAMINB12, FOLATE, FERRITIN, TIBC, IRON, RETICCTPCT in the last 72 hours. Sepsis Labs: No results for input(s): PROCALCITON,  LATICACIDVEN in the last 168 hours.  Recent Results (from the past 240 hour(s))  Culture, Urine     Status: Abnormal   Collection Time: 01/18/20  5:30 AM   Specimen: Urine, Random  Result Value Ref Range Status   Specimen Description URINE, RANDOM  Final   Special Requests   Final    NONE Performed at [facility], [address], [place], [place] [address]    Culture >=100,000 COLONIES/mL ESCHERICHIA COLI (A)  Final   Report Status 01/21/2020 FINAL  Final   Organism ID, Bacteria ESCHERICHIA COLI (A)  Final      Susceptibility   Escherichia coli - MIC*    AMPICILLIN <=2 SENSITIVE Sensitive     CEFAZOLIN <=4 SENSITIVE Sensitive     CEFEPIME <=0.12 SENSITIVE Sensitive     CEFTRIAXONE <=0.25 SENSITIVE Sensitive     CIPROFLOXACIN <=0.25 SENSITIVE Sensitive     GENTAMICIN <=1 SENSITIVE Sensitive     IMIPENEM <=0.25 SENSITIVE Sensitive     NITROFURANTOIN <=16 SENSITIVE Sensitive     TRIMETH/SULFA <=20 SENSITIVE Sensitive  AMPICILLIN/SULBACTAM <=2 SENSITIVE Sensitive     PIP/TAZO <=4 SENSITIVE Sensitive     * >=100,000 COLONIES/mL ESCHERICHIA COLI         Radiology Studies: No results found.      Scheduled Meds: . dexamethasone  4 mg Oral Q12H  . levETIRAcetam  1,500 mg Oral BID  . morphine CONCENTRATE  5 mg Sublingual Q6H   Continuous Infusions:    LOS: 12 days    Time spent: 25 minutes    Barb Merino, MD [facility] Pager [phone]

## 2020-01-27 NOTE — Progress Notes (Signed)
PROGRESS NOTE    [name] [name]  WUJ:[id number] DOB: 03-Mar-1945 DOA: 01/14/2020 PCP: Darreld Mclean, MD    Brief Narrative:  75 year old female with history of left frontal meningioma and cavernous sinus meningioma s/p resection, on steroids and Keppra, hypertension hyperlipidemia, recent history of pulmonary embolism brought to the ER with progressive confusion and weakness, difficulty walking for 10 days.  Recent MRI showed recurrence of left frontal meningioma at the resection site. She was admitted to the hospital, underwent different investigations to look for any secondary cause for encephalopathy and confusion. Followed by oncology, palliative. 1/14, decision made for comfort care. 1/17, remains a stable.  Waiting for inpatient hospice bed availability.   Does not have any family support at home to go home with home hospice.  It may help symptomatically, will treat her E. coli UTI. 1/19, remains in the hospital waiting for inpatient hospice bed.  Fairly comfortable and symptoms are managed. 1/20, patient has well-managed symptoms and currently stabilized.  Declined by residential hospice.  No enough support at home to go home with home hospice. Referred to SNF.   Assessment & Plan:   Active Problems:   Hyperlipidemia   Meningioma (HCC)   Acute metabolic encephalopathy   Acute encephalopathy   Epilepsia ([place])   SIRS (systemic inflammatory response syndrome) (HCC)   History of pulmonary embolus (PE)   Chronic diastolic CHF (congestive heart failure) (HCC)   End of life care  Acute encephalopathy due to recurrent meningioma and suspected seizure: Clinically stabilizing. Failure to thrive in adult, ongoing issue. History of pulmonary embolism, not on anticoagulation. Chronic diastolic heart failure, euvolemic. End-of-life care Acute UTI present on admission due to E. coli  Plan: Patient is on full comfort measures.   Unable to go to inpatient hospice. Report to a  skilled nursing facility. All symptom management medications are available. Patient is on steroids and Keppra.  We will continue.  Completed 5 days of antibiotic therapy for UTI.  DVT prophylaxis: Comfort care   Code Status: Comfort care Family Communication: None today. Disposition Plan: Status is: Inpatient  Remains inpatient appropriate because:Unsafe d/c plan Patient needs placement.  No support at home to go home with home hospice.  Dispo: The patient is from: Home              Anticipated d/c is to: Unknown. snf or inpatient hospice.              Anticipated d/c date is: Whenever bed available.              Patient currently is medically stable to d/c.        Consultants:   Neuro oncology  Neurology  Palliative  Procedures:   EEG  Antimicrobials:   Rocephin, 1/17 1 dose.  Keflex, 1/18----1/22   Subjective: Patient seen and examined.  She did not eat any food last 24 hours.  Has no appetite.  Patient herself is poor historian, she denies any complaints but she is more withdrawn and slow today. Objective: Vitals:   01/25/20 2130 01/26/20 1737 01/26/20 2040 01/27/20 0507  BP: 140/90 125/77 125/81 139/89  Pulse: 68 80 71 68  Resp: 20 18 16 18   Temp: 97.6 F (36.4 C) 98.9 F (37.2 C) 98.4 F (36.9 C) 98.2 F (36.8 C)  TempSrc:   Oral Oral  SpO2: 98% 94% 95% 96%  Weight:      Height:        Intake/Output Summary (Last 24 hours) at 01/27/2020  [place] filed at 01/26/2020 1737 Gross per 24 hour  Intake -  Output 500 ml  Net -500 ml   Filed Weights   01/14/20 2200 01/16/20 0030  Weight: 76 kg 76 kg    Examination:  Looks comfortable. Withdrawn and slow to respond.    Data Reviewed: I have personally reviewed following labs and imaging studies  CBC: No results for input(s): WBC, NEUTROABS, HGB, HCT, MCV, PLT in the last 168 hours. Basic Metabolic Panel: No results for input(s): NA, K, CL, CO2, GLUCOSE, BUN, CREATININE, CALCIUM, MG,  PHOS in the last 168 hours. GFR: Estimated Creatinine Clearance: 60.7 mL/min (by C-G formula based on SCr of 0.71 mg/dL). Liver Function Tests: No results for input(s): AST, ALT, ALKPHOS, BILITOT, PROT, ALBUMIN in the last 168 hours. No results for input(s): LIPASE, AMYLASE in the last 168 hours. No results for input(s): AMMONIA in the last 168 hours. Coagulation Profile: No results for input(s): INR, PROTIME in the last 168 hours. Cardiac Enzymes: No results for input(s): CKTOTAL, CKMB, CKMBINDEX, TROPONINI in the last 168 hours. BNP (last 3 results) Recent Labs    11/19/19 1512  PROBNP 96.0   HbA1C: No results for input(s): HGBA1C in the last 72 hours. CBG: No results for input(s): GLUCAP in the last 168 hours. Lipid Profile: No results for input(s): CHOL, HDL, LDLCALC, TRIG, CHOLHDL, LDLDIRECT in the last 72 hours. Thyroid Function Tests: No results for input(s): TSH, T4TOTAL, FREET4, T3FREE, THYROIDAB in the last 72 hours. Anemia Panel: No results for input(s): VITAMINB12, FOLATE, FERRITIN, TIBC, IRON, RETICCTPCT in the last 72 hours. Sepsis Labs: No results for input(s): PROCALCITON, LATICACIDVEN in the last 168 hours.  Recent Results (from the past 240 hour(s))  Culture, Urine     Status: Abnormal   Collection Time: 01/18/20  5:30 AM   Specimen: Urine, Random  Result Value Ref Range Status   Specimen Description URINE, RANDOM  Final   Special Requests   Final    NONE Performed at [facility], [address] [place], [place] [address]    Culture >=100,000 COLONIES/mL ESCHERICHIA COLI (A)  Final   Report Status 01/21/2020 FINAL  Final   Organism ID, Bacteria ESCHERICHIA COLI (A)  Final      Susceptibility   Escherichia coli - MIC*    AMPICILLIN <=2 SENSITIVE Sensitive     CEFAZOLIN <=4 SENSITIVE Sensitive     CEFEPIME <=0.12 SENSITIVE Sensitive     CEFTRIAXONE <=0.25 SENSITIVE Sensitive     CIPROFLOXACIN <=0.25 SENSITIVE Sensitive     GENTAMICIN <=1  SENSITIVE Sensitive     IMIPENEM <=0.25 SENSITIVE Sensitive     NITROFURANTOIN <=16 SENSITIVE Sensitive     TRIMETH/SULFA <=20 SENSITIVE Sensitive     AMPICILLIN/SULBACTAM <=2 SENSITIVE Sensitive     PIP/TAZO <=4 SENSITIVE Sensitive     * >=100,000 COLONIES/mL ESCHERICHIA COLI         Radiology Studies: No results found.      Scheduled Meds: . dexamethasone  4 mg Oral Q12H  . levETIRAcetam  1,500 mg Oral BID  . morphine CONCENTRATE  5 mg Sublingual Q6H   Continuous Infusions:    LOS: 13 days    Time spent: 20 minutes    Barb Merino, MD [facility] Pager [phone]

## 2020-01-27 NOTE — Progress Notes (Signed)
Daily Progress Note   Patient Name: [name] [name]       Date: 01/27/2020 DOB: [birth year]  Age: 75 y.o. MRN#: [id number] Attending Physician: Barb Merino, MD Primary Care Physician: Darreld Mclean, MD Admit Date: 01/14/2020  Reason for Follow-up: symptom management, disposition  Subjective: Patient is alert and able to answer simple questions. She appears comfortable and denies pain.   [name] is at bedside. Discussed her slight improvement in mental and functional status since we first discussed [place]. For this reason, she was not eligible for [place] residential hospice as first suspected.  There has also  been a referral made to [facility], as they accept patients with a longer prognosis. Review pending.   Discussed disposition plan at present is SNF for rehab versus [place] hospice.   If disposition ends up as SNF/rehab, [name] is agreeable to have outpatient palliative follow up with [facility]   Discussed that focus of care will continue to be primarily on comfort.   Length of Stay: 13  Current Medications: Scheduled Meds:  . dexamethasone  4 mg Oral Q12H  . levETIRAcetam  1,500 mg Oral BID  . morphine CONCENTRATE  5 mg Sublingual Q6H      PRN Meds: acetaminophen **OR** acetaminophen, antiseptic oral rinse, glycopyrrolate **OR** glycopyrrolate, hydrALAZINE, LORazepam, morphine CONCENTRATE, ondansetron **OR** ondansetron (ZOFRAN) IV, polyvinyl alcohol  Physical Exam Vitals reviewed.  Constitutional:      General: She is not in acute distress. Pulmonary:     Effort: Pulmonary effort is normal.  Neurological:     Mental Status: She is alert.     Motor: Weakness present.  Psychiatric:        Mood and Affect: Affect is  flat.             Vital Signs: BP 139/89 (BP Location: Right Arm)   Pulse 68   Temp 98.2 F (36.8 C) (Oral)   Resp 18   Ht $R'5\' 2"'VV$  (1.575 m)   Wt 76 kg   SpO2 96%   BMI 30.65 kg/m  SpO2: SpO2: 96 % O2 Device: O2 Device: Room Air O2 Flow Rate:    Intake/output summary:   Intake/Output Summary (Last 24 hours) at 01/27/2020 1108 Last data filed at 01/26/2020 1737 Gross per 24 hour  Intake -  Output 500 ml  Net -500 ml   LBM: Last BM Date: 01/18/20 Baseline Weight: Weight: 76 kg Most recent weight: Weight: 76 kg       Palliative Assessment/Data: PPS 40%     Palliative Care Assessment & Plan   HPI/Patient Profile:74 y.o.femalewith past medical history of left frontal meningioma and sinus meningioma, PE on Eliquis, chronic diastolic CHF, hypertension, osteroporosis presenting to the emergency departmenton 1/10/2022with altered mental status, weakness, and difficulty walking for the past 10 days.MRI revealed recurrence of left frontal meningioma and bifrontal edema which has grown rapidly since the prior MRI 11/10/19. She was admitted to [facility] for acute encephalopathy in the setting of progressive meningioma.  EEG 1/11 showed evidence of epileptogenicity as well as cortical dysfunction arising from bifrontal region likely secondary to underlying structural abnormality as well as craniotomy. No seizures were seen during EEG. Neurologyhasfelt her symptoms are likely due to worsening of left frontal meningioma  Assessment: - acute encephalopathy due to recurrent meningioma - suspected seizure - history of PE - chronic diastolic heart failure - failure to thrive  - end of life care  Recommendations/Plan:  Continue comfort care measures  Continue scheduled morphine concentrate solution 5 mg every 6 hours   Pending referral to Hospice in [place]  Alternate disposition plan will be SNF/rehab - if so will need outpatient palliative follow-up with [facility]  Goals of Care and Additional Recommendations (per MOST completed 01/18/20):  Cardiopulmonary Resuscitation: Do Not Attempt Resuscitation (DNR/No CPR)  Medical Interventions: Comfort Measures: Keep clean, warm, and dry. Use medication by any route, positioning, wound care, and other measures to relieve pain and suffering. Use oxygen, suction and manual treatment of airway obstruction as needed for comfort. Do not transfer to the hospital unless comfort needs cannot be met in current location.  Antibiotics: Determine use of limitation of antibiotics when infection occurs  IV Fluids: IV fluids for a defined trial period  Feeding Tube: No feeding tube     Code Status: DNR/DNI  Prognosis: Difficult to determine. Slightly improved mental and functional status, but at high risk for recurrant rapid decline in the setting of aggressive recurrent meningioma  Discharge Planning:  To be determined   Thank you for allowing the Palliative Medicine Team to assist in the care of this patient.   Total Time 25 minutes Prolonged Time Billed  no       Greater than 50%  of this time was spent counseling and coordinating care related to the above assessment and plan.  Lavena Bullion, NP  Please contact Palliative Medicine Team phone at [phone] for questions and concerns.

## 2020-01-28 MED ORDER — MORPHINE SULFATE (CONCENTRATE) 10 MG/0.5ML PO SOLN
5.0000 mg | ORAL | Status: DC | PRN
  Filled 2020-01-28: qty 0.5

## 2020-01-28 NOTE — TOC Progression Note (Signed)
Transition of Care [facility]) - Progression Note    Patient Details  Name: [name] [name] MRN: [id number] Date of Birth: 1945/01/15  Transition of [facility] CM/SW Contact  Joanne Chars, LCSW Phone Number: 01/28/2020, 1:38 PM  Clinical Narrative:   CSW contacted the following SNF facilities asking them to review referral: [facility]     Expected Discharge Plan: [place]    Expected Discharge Plan and Services Expected Discharge Plan: [place]                                               Social Determinants of Health (SDOH) Interventions    Readmission Risk Interventions Readmission Risk Prevention Plan 11/13/2019  Post Dischage Appt Complete  Medication Screening Complete  Transportation Screening Complete  Some recent data might be hidden

## 2020-01-28 NOTE — Plan of Care (Signed)
  Problem: Education: Goal: Knowledge of General Education information will improve Description Including pain rating scale, medication(s)/side effects and non-pharmacologic comfort measures Outcome: Progressing   

## 2020-01-28 NOTE — Progress Notes (Signed)
PROGRESS NOTE    [name] [name]  TIW:[id number] DOB: [birth year] DOA: 01/14/2020 PCP: Darreld Mclean, MD    Brief Narrative:  75 year old female with history of left frontal meningioma and cavernous sinus meningioma s/p resection, on steroids and Keppra, hypertension hyperlipidemia, recent history of pulmonary embolism brought to the ER with progressive confusion and weakness, difficulty walking for 10 days.  Recent MRI showed recurrence of left frontal meningioma at the resection site. She was admitted to the hospital, underwent different investigations to look for any secondary cause for encephalopathy and confusion. Followed by oncology, palliative. 1/14, decision made for comfort care. 1/17, remains a stable.  Waiting for inpatient hospice bed availability.   Does not have any family support at home to go home with home hospice.  It may help symptomatically, will treat her E. coli UTI. 1/19, remains in the hospital waiting for inpatient hospice bed.  Fairly comfortable and symptoms are managed. 1/20, patient has well-managed symptoms and currently stabilized.  Declined by residential hospice.  No enough support at home to go home with home hospice. Referred to SNF.   Assessment & Plan:   Active Problems:   Hyperlipidemia   Meningioma (HCC)   Acute metabolic encephalopathy   Acute encephalopathy   Epilepsia ([place])   SIRS (systemic inflammatory response syndrome) (HCC)   History of pulmonary embolus (PE)   Chronic diastolic CHF (congestive heart failure) (HCC)   End of life care  Acute encephalopathy due to recurrent meningioma and suspected seizure: Clinically stabilizing. Failure to thrive in adult, ongoing issue. History of pulmonary embolism, not on anticoagulation. Chronic diastolic heart failure, euvolemic. End-of-life care Acute UTI present on admission due to E. coli  Plan: Patient is on full comfort measures.   Unable to go to inpatient hospice. Referred to a  skilled nursing facility. All symptom management medications are available. Patient is on steroids and Keppra.  We will continue.  Completed 5 days of antibiotic therapy for UTI. Patient is comfortable.  We do not need to wake her up to give her morphine.  Will change to as needed doses.  DVT prophylaxis: Comfort care   Code Status: Comfort care Family Communication: None today.  Palliative team communicating. Disposition Plan: Status is: Inpatient  Remains inpatient appropriate because:Unsafe d/c plan Patient needs placement.  No support at home to go home with home hospice.  Dispo: The patient is from: Home              Anticipated d/c is to: Unknown. snf or inpatient hospice.              Anticipated d/c date is: Whenever bed available.              Patient currently is medically stable to d/c.        Consultants:   Neuro oncology  Neurology  Palliative  Procedures:   EEG  Antimicrobials:   Rocephin, 1/17 1 dose.  Keflex, 1/18----1/22   Subjective: Patient seen and examined.  Denies any complaints.  Does not have great appetite but trying to eat some food.  Looks comfortable and more alert now. Objective: Vitals:   01/26/20 2040 01/27/20 0507 01/27/20 1825 01/27/20 1948  BP: 125/81 139/89 134/64 125/72  Pulse: 71 68 72 64  Resp: 16 18 18 18   Temp: 98.4 F (36.9 C) 98.2 F (36.8 C) 99.1 F (37.3 C) 98.5 F (36.9 C)  TempSrc: Oral Oral Oral Oral  SpO2: 95% 96% 95% 96%  Weight:  Height:        Intake/Output Summary (Last 24 hours) at 01/28/2020 1312 Last data filed at 01/28/2020 0322 Gross per 24 hour  Intake 600 ml  Output 950 ml  Net -350 ml   Filed Weights   01/14/20 2200 01/16/20 0030  Weight: 76 kg 76 kg    Examination:  Looks comfortable. Not in any distress. Physical exam unchanged.     Data Reviewed: I have personally reviewed following labs and imaging studies  CBC: No results for input(s): WBC, NEUTROABS, HGB, HCT, MCV,  PLT in the last 168 hours. Basic Metabolic Panel: No results for input(s): NA, K, CL, CO2, GLUCOSE, BUN, CREATININE, CALCIUM, MG, PHOS in the last 168 hours. GFR: Estimated Creatinine Clearance: 60.7 mL/min (by C-G formula based on SCr of 0.71 mg/dL). Liver Function Tests: No results for input(s): AST, ALT, ALKPHOS, BILITOT, PROT, ALBUMIN in the last 168 hours. No results for input(s): LIPASE, AMYLASE in the last 168 hours. No results for input(s): AMMONIA in the last 168 hours. Coagulation Profile: No results for input(s): INR, PROTIME in the last 168 hours. Cardiac Enzymes: No results for input(s): CKTOTAL, CKMB, CKMBINDEX, TROPONINI in the last 168 hours. BNP (last 3 results) Recent Labs    11/19/19 1512  PROBNP 96.0   HbA1C: No results for input(s): HGBA1C in the last 72 hours. CBG: No results for input(s): GLUCAP in the last 168 hours. Lipid Profile: No results for input(s): CHOL, HDL, LDLCALC, TRIG, CHOLHDL, LDLDIRECT in the last 72 hours. Thyroid Function Tests: No results for input(s): TSH, T4TOTAL, FREET4, T3FREE, THYROIDAB in the last 72 hours. Anemia Panel: No results for input(s): VITAMINB12, FOLATE, FERRITIN, TIBC, IRON, RETICCTPCT in the last 72 hours. Sepsis Labs: No results for input(s): PROCALCITON, LATICACIDVEN in the last 168 hours.  No results found for this or any previous visit (from the past 240 hour(s)).       Radiology Studies: No results found.      Scheduled Meds: . dexamethasone  4 mg Oral Q12H  . levETIRAcetam  1,500 mg Oral BID   Continuous Infusions:    LOS: 14 days    Time spent: 20 minutes    Barb Merino, MD [facility] Pager [phone]

## 2020-01-29 ENCOUNTER — Other Ambulatory Visit: Payer: Self-pay | Admitting: Family Medicine

## 2020-01-29 DIAGNOSIS — E785 Hyperlipidemia, unspecified: Secondary | ICD-10-CM

## 2020-01-29 MED ORDER — DEXAMETHASONE 4 MG PO TABS
4.0000 mg | ORAL_TABLET | Freq: Every day | ORAL | Status: DC
  Administered 2020-01-29 – 2020-01-31 (×3): 4 mg via ORAL
  Filled 2020-01-29 (×3): qty 1

## 2020-01-29 NOTE — Progress Notes (Signed)
PROGRESS NOTE    [name] [name]  HCW:[id number] DOB: [birth year] DOA: 01/14/2020 PCP: Darreld Mclean, MD    Brief Narrative:  75 year old female with history of left frontal meningioma and cavernous sinus meningioma s/p resection, on steroids and Keppra, hypertension hyperlipidemia, recent history of pulmonary embolism brought to the ER with progressive confusion and weakness, difficulty walking for 10 days.  Recent MRI showed recurrence of left frontal meningioma at the resection site. She was admitted to the hospital, underwent different investigations to look for any secondary cause for encephalopathy and confusion. Followed by oncology, palliative.  1/14, decision made for comfort care. 1/17, remains stable.  Waiting for inpatient hospice bed availability.   Does not have any family support at home to go home with home hospice.  1/19, remains in the hospital waiting for inpatient hospice bed.  Fairly comfortable and symptoms are managed. 1/20, patient has well-managed symptoms and currently stabilized.  Declined by residential hospice.  No enough support at home to go home with home hospice. Referred to SNF and waiting for bed offers.   Assessment & Plan:   Active Problems:   Hyperlipidemia   Meningioma (HCC)   Acute metabolic encephalopathy   Acute encephalopathy   Epilepsia ([place])   SIRS (systemic inflammatory response syndrome) (HCC)   History of pulmonary embolus (PE)   Chronic diastolic CHF (congestive heart failure) (HCC)   End of life care  Acute encephalopathy due to recurrent meningioma and suspected seizure: Clinically stabilizing. Failure to thrive in adult, ongoing issue. History of pulmonary embolism, not on anticoagulation. Chronic diastolic heart failure, euvolemic. End-of-life care Acute UTI present on admission due to E. coli  Plan: Patient is comfort care.    Inpatient hospice versus a skilled nursing facility.   All symptom management medications  are available. Patient is on steroids and Keppra.  We will continue.  Completed 5 days of antibiotic therapy for UTI. Patient is comfortable.  We do not need to wake her up to give her morphine.  Will change to as needed doses. Will decrease dose of dexamethasone today, will gradually taper off.  DVT prophylaxis: Comfort care   Code Status: Comfort care Family Communication: None today.  Palliative team communicating. Disposition Plan: Status is: Inpatient  Remains inpatient appropriate because:Unsafe d/c plan Patient needs placement.  No support at home to go home with home hospice.  Dispo: The patient is from: Home              Anticipated d/c is to: Unknown. snf or inpatient hospice.              Anticipated d/c date is: Whenever bed available.              Patient currently is medically stable to d/c.        Consultants:   Neuro oncology  Neurology  Palliative  Procedures:   EEG  Antimicrobials:   Rocephin, 1/17 1 dose.  Keflex, 1/18----1/22   Subjective: Patient seen and examined.  Denies any complaints.  No overnight events.  Denies any pain, nausea or vomiting.  She thinks she is eating as much as she needs.  Objective: Vitals:   01/27/20 1825 01/27/20 1948 01/28/20 1931 01/29/20 1046  BP: 134/64 125/72 111/75 113/66  Pulse: 72 64 72 94  Resp: 18 18 20 19   Temp: 99.1 F (37.3 C) 98.5 F (36.9 C) (!) 97.4 F (36.3 C) 97.8 F (36.6 C)  TempSrc: Oral Oral Axillary Axillary  SpO2: 95% 96%  95% 94%  Weight:      Height:        Intake/Output Summary (Last 24 hours) at 01/29/2020 1132 Last data filed at 01/29/2020 6440 Gross per 24 hour  Intake -  Output 750 ml  Net -750 ml   Filed Weights   01/14/20 2200 01/16/20 0030  Weight: 76 kg 76 kg    Examination:  Looks comfortable. Chronically sick looking, not in any distress.  She is on room air. Atraumatic. Bilateral equal breath sounds. Regular heart sounds, no murmurs. Abdomen is soft and  nontender. She has generalized weakness but no focal deficits. Patient is alert oriented x2-3, generalized weakness, slow to respond.    Data Reviewed: I have personally reviewed following labs and imaging studies  CBC: No results for input(s): WBC, NEUTROABS, HGB, HCT, MCV, PLT in the last 168 hours. Basic Metabolic Panel: No results for input(s): NA, K, CL, CO2, GLUCOSE, BUN, CREATININE, CALCIUM, MG, PHOS in the last 168 hours. GFR: Estimated Creatinine Clearance: 60.7 mL/min (by C-G formula based on SCr of 0.71 mg/dL). Liver Function Tests: No results for input(s): AST, ALT, ALKPHOS, BILITOT, PROT, ALBUMIN in the last 168 hours. No results for input(s): LIPASE, AMYLASE in the last 168 hours. No results for input(s): AMMONIA in the last 168 hours. Coagulation Profile: No results for input(s): INR, PROTIME in the last 168 hours. Cardiac Enzymes: No results for input(s): CKTOTAL, CKMB, CKMBINDEX, TROPONINI in the last 168 hours. BNP (last 3 results) Recent Labs    11/19/19 1512  PROBNP 96.0   HbA1C: No results for input(s): HGBA1C in the last 72 hours. CBG: No results for input(s): GLUCAP in the last 168 hours. Lipid Profile: No results for input(s): CHOL, HDL, LDLCALC, TRIG, CHOLHDL, LDLDIRECT in the last 72 hours. Thyroid Function Tests: No results for input(s): TSH, T4TOTAL, FREET4, T3FREE, THYROIDAB in the last 72 hours. Anemia Panel: No results for input(s): VITAMINB12, FOLATE, FERRITIN, TIBC, IRON, RETICCTPCT in the last 72 hours. Sepsis Labs: No results for input(s): PROCALCITON, LATICACIDVEN in the last 168 hours.  No results found for this or any previous visit (from the past 240 hour(s)).       Radiology Studies: No results found.      Scheduled Meds: . dexamethasone  4 mg Oral Daily  . levETIRAcetam  1,500 mg Oral BID   Continuous Infusions:    LOS: 15 days    Time spent: 20 minutes    Barb Merino, MD [facility] Pager  [phone]

## 2020-01-29 NOTE — Progress Notes (Signed)
Physical Therapy Treatment Patient Details Name: [name] [name] MRN: [id number] DOB: 1945/08/20 Today's Date: 01/29/2020    History of Present Illness 75 year old female with history of left frontal meningioma and cavernous sinus meningioma s/p resection, on steroids and Keppra, hypertension hyperlipidemia, recent history of pulmonary embolism brought to the ER with progressive confusion and weakness, difficulty walking for 10 days.  Recent MRI showed recurrence of left frontal meningioma at the resection site. Pt made comfort care on 1/14, has remained stable and was denied residential hospice 1/20.    PT Comments    Pt was able to participate in numerous sit to stand trials, and seated UE/LE exercise program.  She reported back pain during mobility, but improved at rest.  RN tech reported it took two to get her OOB to the recliner chair earlier.  PT will continue to follow acutely for safe mobility progression.   Follow Up Recommendations  SNF     Equipment Recommendations  Wheelchair (measurements PT);Wheelchair cushion (measurements PT);Hospital bed;3in1 (PT)    Recommendations for Other Services       Precautions / Restrictions Precautions Precautions: Fall Precaution Comments: seizure precautions    Mobility  Bed Mobility               General bed mobility comments: Pt was OOB in the recliner chair.  Transfers Overall transfer level: Needs assistance Equipment used: Rolling walker (2 wheeled);None Transfers: Sit to/from Stand           General transfer comment: Stood/attempted to stand 6 times, first two with RW and one leg blocked, but unable, 4 more times with rest in between with bed pad to help lift and extend hips into standing, pt pushing from armrests on chair.  Ambulation/Gait             General Gait Details: too weak at this time. RN tech reports it took two people to get her OOB to the recliner chair earlier.   Stairs              Wheelchair Mobility    Modified Rankin (Stroke Patients Only)       Balance Overall balance assessment: Needs assistance Sitting-balance support: Feet supported;Bilateral upper extremity supported Sitting balance-Leahy Scale: Poor Sitting balance - Comments: reliant on UE support to maintain back off of recliner backrest.   Standing balance support: Bilateral upper extremity supported;Single extremity supported Standing balance-Leahy Scale: Poor Standing balance comment: One person heavy mod assist to come to standing.                            Cognition Arousal/Alertness: Awake/alert Behavior During Therapy: Flat affect Overall Cognitive Status: Impaired/Different from baseline Area of Impairment: Orientation;Memory;Attention;Following commands;Safety/judgement;Awareness;Problem solving                 Orientation Level: Disoriented to;Time;Situation Current Attention Level: Sustained Memory: Decreased recall of precautions;Decreased short-term memory Following Commands: Follows one step commands with increased time;Follows one step commands inconsistently Safety/Judgement: Decreased awareness of safety;Decreased awareness of deficits Awareness: Intellectual Problem Solving: Slow processing;Decreased initiation;Difficulty sequencing;Requires verbal cues;Requires tactile cues General Comments: Pt has a cold shower cap on her head, reports being cold, but does not associate wet head with increased cold/discomfort.  Difficulty initiating and sustaining movement at times without re-direction to task. Slow processing, easily physically and cognitively fatigued.      Exercises General Exercises - Upper Extremity Shoulder Flexion: AAROM;Both;10 reps Elbow Flexion: AAROM;Both;5 reps (fatigued after  5) General Exercises - Lower Extremity Ankle Circles/Pumps: AROM;AAROM;Both;10 reps Long Arc Quad: AROM;AAROM;Both;10 reps Hip Flexion/Marching:  AROM;AAROM;Both;10 reps Other Exercises Other Exercises: Pt limited in HEP by fatigue and limited sustained attention.    General Comments General comments (skin integrity, edema, etc.): After shower cap removed and hair dried with towel pt able to start, but did not have endurance/concentration to finish, so PT took over.      Pertinent Vitals/Pain Pain Assessment: Faces Faces Pain Scale: Hurts even more Pain Location: reports back and buttocks Pain Descriptors / Indicators: Grimacing;Guarding Pain Intervention(s): Limited activity within patient's tolerance;Monitored during session;Repositioned    Home Living                      Prior Function            PT Goals (current goals can now be found in the care plan section) Acute Rehab PT Goals Patient Stated Goal: "I have a plan" but unable to relay what her plan is at this time. Progress towards PT goals: Progressing toward goals    Frequency    Min 2X/week      PT Plan Current plan remains appropriate    Co-evaluation              AM-PAC PT "6 Clicks" Mobility   Outcome Measure  Help needed turning from your back to your side while in a flat bed without using bedrails?: A Lot Help needed moving from lying on your back to sitting on the side of a flat bed without using bedrails?: A Lot Help needed moving to and from a bed to a chair (including a wheelchair)?: A Lot Help needed standing up from a chair using your arms (e.g., wheelchair or bedside chair)?: A Lot Help needed to walk in hospital room?: Total Help needed climbing 3-5 steps with a railing? : Total 6 Click Score: 10    End of Session   Activity Tolerance: Patient limited by fatigue;Patient limited by pain Patient left: in chair;with call bell/phone within reach;with chair alarm set Nurse Communication: Mobility status (to RN tech) PT Visit Diagnosis: Muscle weakness (generalized) (M62.81);Other abnormalities of gait and mobility  (R26.89);Difficulty in walking, not elsewhere classified (R26.2);Unsteadiness on feet (R26.81)     Time: 1779-3903 PT Time Calculation (min) (ACUTE ONLY): 19 min  Charges:  $Therapeutic Activity: 8-22 mins                     Verdene Lennert, PT, DPT  Acute Rehabilitation [phone] pager [phone]) [phone] office

## 2020-01-29 NOTE — Consult Note (Signed)
   [facility] Inpatient Consult   01/29/2020  [name] [name] Apr 15, 1945 [id number]   [place] [facility] [ACO] Patient: [name] [name]   Patient screened for length of stay [LLOS] hospitalization to check for potential [place] Management service needs.  Review of patient's medical record for barriers to post hospital care reveals patient is being recommended for a skilled nursing facility [SNF] level of care with palliative verse Hospice facility.  Plan:  Patient is currently for comfort care measure. No current [facility] Care Management needs assessed at this time.  Please place a [facility] Care Management consult as appropriate and for questions contact:   Natividad Brood, RN BSN [facility] Liaison  [phone] business mobile phone Toll free office [phone]  Fax number: [phone] [email or web address]

## 2020-01-29 NOTE — TOC Progression Note (Addendum)
Transition of Care [facility]) - Progression Note    Patient Details  Name: [name] [name] MRN: [id number] Date of Birth: [birth year]  Transition of Care [facility]) CM/SW Contact  Joanne Chars, LCSW Phone Number: 01/29/2020, 2:24 PM  Clinical Narrative:   Pt referral faxed to [facility]NF.  CSW also LM with Cassandra at [facility] to see if she had reviewed referral.   Pt now has bed offer at [facility].  CSW informed pt, called significant other, [name] [name].  [name] [name] does not want to accept this offer, said family is still wanting pt to go to [facility] and believes they will have available bed soon.   CSW called Elyse Hsu at [address] and [facility] asking for update.  [facility] has not accepted pt in hub.    1540: SNF auth submitted to [place]: Phone call from [facility].  They are testing for covid tomorrow early.  If all come back negative, they will have a bed for [name] tomorrow.  If they have more positive tests, not sure when they would have a bed.  She will call back tomorrow with results.    Expected Discharge Plan: Skilled Nursing Facility Barriers to Discharge: SNF Pending bed offer  Expected Discharge Plan and Services Expected Discharge Plan: [place]                                               Social Determinants of Health (SDOH) Interventions    Readmission Risk Interventions Readmission Risk Prevention Plan 11/13/2019  Post Dischage Appt Complete  Medication Screening Complete  Transportation Screening Complete  Some recent data might be hidden

## 2020-01-30 LAB — SARS CORONAVIRUS 2 (TAT 6-24 HRS): SARS Coronavirus 2: NEGATIVE

## 2020-01-30 MED ORDER — LEVETIRACETAM 750 MG PO TABS: 1500.0000 mg | ORAL_TABLET | Freq: Two times a day (BID) | ORAL | Status: AC

## 2020-01-30 MED ORDER — DEXAMETHASONE 4 MG PO TABS: 4.0000 mg | ORAL_TABLET | Freq: Every day | ORAL | Status: AC

## 2020-01-30 NOTE — Progress Notes (Signed)
Palliative Medicine RN Note: Chart review done. Note difficulties with placement. If pt goes home, she will be able to have hospice at home. Placed TOC order per Dr Hilma Favors to set up home hospice if she doesn't end up at a SNF.  Marjie Skiff Dachelle Molzahn, RN, BSN, [facility] Palliative Medicine Team 01/30/2020 12:39 PM Office [phone]

## 2020-01-30 NOTE — Care Management Important Message (Signed)
Important Message  Patient Details  Name: [name] [name] MRN: [id number] Date of Birth: 1945/01/06   Medicare Important Message Given:  Yes - Important Message mailed due to current National Emergency  Verbal consent obtained due to current National Emergency  Relationship to patient: Spouse/Significant Other Contact Name: [name] Call Date: 01/30/20  Time: 1401 Phone: [phone] Outcome: Spoke with contact Important Message mailed to: Other (must enter comment)

## 2020-01-30 NOTE — Discharge Summary (Signed)
Physician Discharge Summary  [name] [name] ZOX:[id number] DOB: 05/03/1945 DOA: 01/14/2020  PCP: Pearline Cablesopland, Jessica C, MD  Admit date: 01/14/2020 Discharge date: 01/30/2020  Admitted From: home Disposition:  home   SNF will have a bed for the patient tomorrow AM. This is a preliminary d/c summary.   Recommendations for Outpatient Follow-up:  1. Palliative care to follow at SNF   Discharge Condition:  stable   CODE STATUS:  DNR   Diet recommendation:  Regular diet Consultations:  Neuro oncology  Neurology  Palliative  Procedures/Studies: . 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  Recent MRI showed recurrence of left frontal meningioma at the resection site. She was admitted to the hospital, underwent different investigations to look for any secondary cause for encephalopathy and confusion. Followed by oncology, palliative.  1/14, decision made for comfort care. 1/17, remains stable.  Waiting for inpatient hospice bed availability.   Does not have any family support at home to go home with home hospice.  1/19, remains in the hospital waiting for inpatient hospice bed.  Fairly comfortable and symptoms are managed. 1/20, patient has well-managed symptoms and currently stabilized.  Declined by residential hospice.  No enough support at home to go home with home hospice. Referred to SNF and waiting for bed offers.  Hospital Course:  Acute  encephalopathy thought to be secondary to a seizure which may have been provoked due to underlying meningioma - 1/14> EEG  : evidence of epileptogenicity as well as cortical dysfunction arising from bifrontal( left>right)region likely secondary to underlying structural abnormality as well as craniotomy. The sharp waves at times appear PLED like and rhythmic which is on the ictal-interictal continuum with high potential for seizures.   - MRI> 1. Postop resection of bifrontal meningioma. Large surgical cavity is present in the left frontal lobe. There is extensive nodular enhancement around the wall of the cavity compatible with the current meningioma. This has grown rapidly since the prior MRI 11/10/2019. Mild progression of bifrontal white matter edema. 2. Bulky left cavernous carotid meningioma stable. -  Started on Keppra and Decadron by neurology - we have been slowly weaning her off of the Decadron  UTI - finished a course of antibiotics for this  Failure to thrive - per notes, patient initially was transitioned to hospice with plans to go home with hospice or to residential hospice- her condition has improved slightly and residential hospice has declined to take her- the plan has been transitioned to going to SNF with palliative care.    Discharge Exam: Vitals:   01/29/20 1046 01/29/20 1955  BP: 113/66 139/85  Pulse: 94 76  Resp: 19 18  Temp: 97.8 F (36.6 C) 97.7 F (36.5 C)  SpO2: 94% 98%   Vitals:   01/27/20 1948 01/28/20 1931 01/29/20 1046 01/29/20 1955  BP: 125/72 111/75 113/66 139/85  Pulse: 64 72 94 76  Resp: 18 20 19 18   Temp: 98.5 F (36.9 C) (!) 97.4 F (36.3 C) 97.8 F (36.6 C) 97.7  F (36.5 C)  TempSrc: Oral Axillary Axillary Oral  SpO2: 96% 95% 94% 98%  Weight:      Height:        General: Pt is alert, awake, not in acute distress Cardiovascular: RRR, S1/S2 +, no rubs, no gallops Respiratory: CTA bilaterally, no wheezing, no rhonchi Abdominal:  Soft, NT, ND, bowel sounds + Extremities: no edema, no cyanosis   Discharge Instructions  Discharge Instructions    Increase activity slowly   Complete by: As directed      Allergies as of 01/30/2020      Reactions   Bee Venom Swelling, Other (See Comments)   Site of swelling not recalled by family- happened during childhood   Losartan Itching      Medication List    STOP taking these medications   apixaban 5 MG Tabs tablet Commonly known as: ELIQUIS   ezetimibe 10 MG tablet Commonly known as: ZETIA   furosemide 20 MG tablet Commonly known as: LASIX   potassium chloride 10 MEQ tablet Commonly known as: KLOR-CON     TAKE these medications   acetaminophen 325 MG tablet Commonly known as: TYLENOL Take 325-650 mg by mouth every 6 (six) hours as needed for mild pain or headache.   dexamethasone 4 MG tablet Commonly known as: DECADRON Take 1 tablet (4 mg total) by mouth daily. Start taking on: January 31, 2020   levETIRAcetam 750 MG tablet Commonly known as: KEPPRA Take 2 tablets (1,500 mg total) by mouth 2 (two) times daily. What changed:   medication strength  how much to take       Allergies  Allergen Reactions  . Bee Venom Swelling and Other (See Comments)    Site of swelling not recalled by family- happened during childhood  . Losartan Itching      CT HEAD WO CONTRAST  Result Date: 01/14/2020 CLINICAL DATA:  Delirium. EXAM: CT HEAD WITHOUT CONTRAST TECHNIQUE: Contiguous axial images were obtained from the base of the skull through the vertex without intravenous contrast. COMPARISON:  MRI of the brain January 11, 2020. FINDINGS: Brain: Postsurgical changes from bifrontal craniotomy for left frontal extra-axial tumor resection. Persistent large surgical cavity with thick nodular margins with the fluid portion measuring approximately 5.0 x 4.6 x 2.7 cm, similar to prior MRI. Mass effect with mild rightward deviation of the anterior aspect of the cerebral  falx (5 mm). Stable appearance of prominent edema within the white matter of the bilateral frontal lobes, including corpus callosum. Unchanged appearance of extra-axial lesion centered in the left sphenoid wing/cavernous sinus. No hydrocephalus or intracranial hemorrhage. Posterior fossa structures appear preserved. Vascular: No hyperdense vessel sign. Skull: Postsurgical changes from bilateral frontal craniotomy. Sinuses/Orbits: Mucosal thickening of the left sphenoid sinus. The orbits are maintained. IMPRESSION: 1. Postsurgical changes from bilateral frontal craniotomy for extra-axial tumor resection. Persistent large surgical cavity with thick nodular margins with the fluid portion measuring up to 5.0 cm, similar to prior MRI. Stable appearance of prominent edema within the white matter of the bilateral frontal lobes, including corpus callosum. 2. Unchanged appearance of extra-axial lesion centered in the left sphenoid wing/cavernous sinus. Electronically Signed   By: Pedro Earls M.D.   On: 01/14/2020 19:01   MR BRAIN W WO CONTRAST  Result Date: 01/11/2020 CLINICAL DATA:  History of meningioma resection. Assess treatment response. EXAM: MRI HEAD WITHOUT AND WITH CONTRAST TECHNIQUE: Multiplanar, multiecho pulse sequences of the brain and surrounding structures were obtained without and with intravenous contrast. CONTRAST:  7.79mL GADAVIST GADOBUTROL 1 MMOL/ML IV SOLN COMPARISON:  MRI head 11/10/2019 FINDINGS: Brain: Bifrontal craniotomy for tumor resection. The anterior falx has been removed. There is a large postoperative fluid collection in the left frontal lobe crossing the midline to the right. There is extensive nodular enhancement along the wall of the surgical cavity which has developed since the prior study and is compatible with locally recurrent tumor. Extensive edema in the frontal lobes bilaterally with progression. Mild mass-effect on the left frontal horn. Second meningioma in the  left cavernous sinus is stable. This is a bulky tumor encasing the left carotid artery with extension into the sella and suprasellar cistern. Ventricle size normal. Negative for acute infarct. Chronic blood products in the left frontal lobe from prior surgery. Vascular: Normal arterial flow voids. There is encasement of the left cavernous carotid which retains normal flow void. Skull and upper cervical spine: 1 cm enhancing lesion in the right parietal bone unchanged. This is hyperintense on T2 and FLAIR. This is hypointense on T1 prior to contrast. Sinuses/Orbits: Paranasal sinuses clear. Bilateral cataract extraction Other: None IMPRESSION: 1. Postop resection of bifrontal meningioma. Large surgical cavity is present in the left frontal lobe. There is extensive nodular enhancement around the wall of the cavity compatible with the current meningioma. This has grown rapidly since the prior MRI 11/10/2019. Mild progression of bifrontal white matter edema. 2. Bulky left cavernous carotid meningioma stable. Electronically Signed   By: Franchot Gallo M.D.   On: 01/11/2020 16:21   DG CHEST PORT 1 VIEW  Result Date: 01/16/2020 CLINICAL DATA:  75 year old female with altered mental status. EXAM: PORTABLE CHEST 1 VIEW COMPARISON:  Chest radiograph dated 01/14/2020. FINDINGS: No focal consolidation, pleural effusion, or pneumothorax. Stable cardiomediastinal silhouette. No acute osseous pathology. IMPRESSION: No active disease.  No interval change. Electronically Signed   By: Anner Crete M.D.   On: 01/16/2020 20:59   DG Chest Port 1 View  Result Date: 01/14/2020 CLINICAL DATA:  Sepsis EXAM: PORTABLE CHEST 1 VIEW COMPARISON:  12/24/2019 FINDINGS: Heart and mediastinal contours are within normal limits. No focal opacities or effusions. No acute bony abnormality. IMPRESSION: No active disease. Electronically Signed   By: Rolm Baptise M.D.   On: 01/14/2020 20:43   EEG adult  Result Date: 01/15/2020 Lora Havens, MD     01/15/2020 10:27 AM Patient Name: Dareth Andrew MRN: [id number] Epilepsy Attending: Lora Havens Referring Physician/Provider: Dr Leonie Man Date: 01/15/2020 Duration: 25.56 mins Patient history: 75 year old woman with bilateral frontal meningiomas worse on the left status post multiple craniotomies for recurrence, presenting for 7 to 10 days worth of altered mental status, difficulty with ambulation and confusion along with word finding difficulty. EEG to evaluate for seizure  Level of alertness: Awake AEDs during EEG study: LEV Technical aspects: This EEG study was done with scalp electrodes positioned according to the 10-20 International system of electrode placement. Electrical activity was acquired at a sampling rate of 500Hz  and reviewed with a high frequency filter of 70Hz  and a low frequency filter of 1Hz . EEG data were recorded continuously and digitally stored. Description: The posterior dominant rhythm consists of 9-10 Hz activity of moderate voltage (25-35 uV) seen predominantly in posterior head regions, symmetric and reactive to eye opening and eye closing. EEG showed 3 to 5 Hz theta-delta slowing in bifrontal region with sharply contoured morphology.  Sharp waves were also noted in bifrontal region.  Hyperventilation and photic stimulation were not performed.    ABNORMALITY -  Sharp waves, bifrontal, left>right -Continuous slow, bifrontal region - Breech artifact, bifrontal region  IMPRESSION: This study showed evidence of epileptogenicity as well as cortical dysfunction arising from bifrontal ( left>right) region likely secondary to underlying structural abnormality as well as craniotomy.  No seizures were seen throughout the recording.  Priyanka [name]   Overnight EEG with video  Result Date: 01/16/2020 Lora Havens, MD     01/17/2020  8:32 AM Patient Name: Spring San MRN: [id number] Epilepsy Attending: Lora Havens Referring Physician/Provider: Dr  Amie Portland Duration:  01/15/2020 1529 to 01/16/2020 1529  Patient history: 75 year old woman withbilateral frontal meningiomas worse on the left status post multiple craniotomies for recurrence, presenting for 7 to 10 days worth of altered mental status, difficulty with ambulation and confusion along with word finding difficulty. EEG to evaluate for seizure  Level of alertness: Awake, asleep  AEDs during EEG study: LEV  Technical aspects: This EEG study was done with scalp electrodes positioned according to the 10-20 International system of electrode placement. Electrical activity was acquired at a sampling rate of 500Hz  and reviewed with a high frequency filter of 70Hz  and a low frequency filter of 1Hz . EEG data were recorded continuously and digitally stored.  Description: The posterior dominant rhythm consists of 9-10 Hz activity of moderate voltage (25-35 uV) seen predominantly in posterior head regions, symmetric and reactive to eye opening and eye closing.   Sleep was characterized by vertex waves, sleep spindles (12 to 14 Hz), maximal frontocentral region. EEG showed 3 to 5 Hz theta-delta slowing in bifrontal region with sharply contoured morphology which appears rhythmic at times.sharp waves were also noted in bifrontal region. Hyperventilation and photic stimulation were not performed.   ABNORMALITY -Sharp waves, bifrontal, left>right -Continuous slow,bifrontal region - Breech artifact, bifrontal region  IMPRESSION: This studyshowed evidence of epileptogenicity as well as cortical dysfunction arising from bifrontal ( left>right) region likely secondary to underlying structural abnormality as well as craniotomy. The sharp waves at times appear rhythmic which is on the ictal- interictal continuum. No definite seizureswere seen throughout the recording.  Lora Havens    The results of significant diagnostics from this hospitalization (including imaging, microbiology, ancillary and  laboratory) are listed below for reference.     Microbiology: No results found for this or any previous visit (from the past 240 hour(s)).   Labs: BNP (last 3 results) Recent Labs    12/04/19 0930  BNP 08.6   Basic Metabolic Panel: No results for input(s): NA, K, CL, CO2, GLUCOSE, BUN, CREATININE, CALCIUM, MG, PHOS in the last 168 hours. Liver Function Tests: No results for input(s): AST, ALT, ALKPHOS, BILITOT, PROT, ALBUMIN in the last 168 hours. No results for input(s): LIPASE, AMYLASE in the last 168 hours. No results for input(s): AMMONIA in the last 168 hours. CBC: No results for input(s): WBC, NEUTROABS, HGB, HCT, MCV, PLT in the last 168 hours. Cardiac Enzymes: No results for input(s): CKTOTAL, CKMB, CKMBINDEX, TROPONINI in the last 168 hours. BNP: Invalid input(s): POCBNP CBG: No results for input(s): GLUCAP in the last 168 hours. D-Dimer No results for input(s): DDIMER in the last 72 hours. Hgb A1c No results for input(s): HGBA1C in the last 72 hours. Lipid Profile No results for input(s): CHOL, HDL, LDLCALC, TRIG, CHOLHDL, LDLDIRECT in the last 72 hours. Thyroid function studies No results for input(s): TSH, T4TOTAL, T3FREE, THYROIDAB in the last 72 hours.  Invalid input(s): FREET3 Anemia work up No results for input(s): VITAMINB12, FOLATE, FERRITIN, TIBC,  IRON, RETICCTPCT in the last 72 hours. Urinalysis    Component Value Date/Time   COLORURINE YELLOW 01/18/2020 0530   APPEARANCEUR HAZY (A) 01/18/2020 0530   LABSPEC 1.015 01/18/2020 0530   PHURINE 8.0 01/18/2020 0530   GLUCOSEU NEGATIVE 01/18/2020 0530   HGBUR NEGATIVE 01/18/2020 0530   BILIRUBINUR NEGATIVE 01/18/2020 0530   KETONESUR NEGATIVE 01/18/2020 0530   PROTEINUR NEGATIVE 01/18/2020 0530   NITRITE NEGATIVE 01/18/2020 0530   LEUKOCYTESUR SMALL (A) 01/18/2020 0530   Sepsis Labs Invalid input(s): PROCALCITONIN,  WBC,  LACTICIDVEN Microbiology No results found for this or any previous visit  (from the past 240 hour(s)).   Time coordinating discharge in minutes: 65  SIGNED:   Debbe Odea, MD  [facility] 01/30/2020, 2:13 PM

## 2020-01-30 NOTE — TOC Progression Note (Addendum)
Transition of Care [facility]) - Progression Note    Patient Details  Name: Cori Justus MRN: [id number] Date of Birth: 05-Feb-1945  [facility]) CM/SW Contact  Joanne Chars, LCSW Phone Number: 01/30/2020, 9:33 AM  Clinical Narrative:   CSW contacted by [facility].  They have had positive covid tests this AM and are closed indefinitely to admissions.  CSW spoke with supervisor Barbette Or about options and she is in agreement that pt will need to accept available bed offer from [facility] or be discharged home.  CSW spoke with [name] and informed him about [facility] being closed to admissions.  The [facility] other choice was [facility], who has not responded in hub.  CSW LM with [facility] and also with [facility], who has declined pt in hub, but who family was also interested in.  Informed [name] that they would have to make a decision today.    1145: CSW spoke with [facility] at [facility] is able to make bed offer.  CSW informed family and they want to accept.  1150: CSW spoke with [place] and confirmed facility choice of [facility].  [facility] reports this it out of network facility which may require Market [employer] review.  [employer] representative Be[facility] May to call back.  [facility] approved authorization:  3 days starting 1/26.  Review on 1/28 with Darnelle Bos.  Josem Kaufmann [id number].  1245: [facility] cannot take pt until tomorrow morning.  [name], pt significant other, informed.       Expected Discharge Plan: Skilled Nursing Facility Barriers to Discharge: SNF Pending bed offer  Expected Discharge Plan and Services Expected Discharge Plan: [facility]                                               Social Determinants of Health (SDOH) Interventions    Readmission Risk Interventions Readmission Risk Prevention Plan 11/13/2019  Post Dischage Appt Complete  Medication Screening Complete  Transportation Screening Complete  Some recent data might be  hidden

## 2020-01-31 DIAGNOSIS — R5381 Other malaise: Secondary | ICD-10-CM | POA: Diagnosis not present

## 2020-01-31 DIAGNOSIS — I509 Heart failure, unspecified: Secondary | ICD-10-CM | POA: Diagnosis not present

## 2020-01-31 DIAGNOSIS — M6281 Muscle weakness (generalized): Secondary | ICD-10-CM | POA: Diagnosis not present

## 2020-01-31 DIAGNOSIS — G40909 Epilepsy, unspecified, not intractable, without status epilepticus: Secondary | ICD-10-CM | POA: Diagnosis not present

## 2020-01-31 DIAGNOSIS — R531 Weakness: Secondary | ICD-10-CM | POA: Diagnosis not present

## 2020-01-31 DIAGNOSIS — G9341 Metabolic encephalopathy: Secondary | ICD-10-CM | POA: Diagnosis not present

## 2020-01-31 DIAGNOSIS — R2681 Unsteadiness on feet: Secondary | ICD-10-CM | POA: Diagnosis not present

## 2020-01-31 DIAGNOSIS — Z7401 Bed confinement status: Secondary | ICD-10-CM | POA: Diagnosis not present

## 2020-01-31 DIAGNOSIS — M2550 Pain in unspecified joint: Secondary | ICD-10-CM | POA: Diagnosis not present

## 2020-01-31 DIAGNOSIS — R410 Disorientation, unspecified: Secondary | ICD-10-CM | POA: Diagnosis not present

## 2020-01-31 DIAGNOSIS — R488 Other symbolic dysfunctions: Secondary | ICD-10-CM | POA: Diagnosis not present

## 2020-01-31 DIAGNOSIS — Z736 Limitation of activities due to disability: Secondary | ICD-10-CM | POA: Diagnosis not present

## 2020-01-31 DIAGNOSIS — Z86711 Personal history of pulmonary embolism: Secondary | ICD-10-CM | POA: Diagnosis not present

## 2020-01-31 DIAGNOSIS — D320 Benign neoplasm of cerebral meninges: Secondary | ICD-10-CM | POA: Diagnosis not present

## 2020-01-31 DIAGNOSIS — D329 Benign neoplasm of meninges, unspecified: Secondary | ICD-10-CM | POA: Diagnosis not present

## 2020-01-31 NOTE — Progress Notes (Signed)
OT Cancellation Note  Patient Details Name: [name] [name] MRN: [id number] DOB: Aug 22, 1945   Cancelled Treatment:    Reason Eval/Treat Not Completed: Patient declined, no reason specified;Other (comment) pt declined session wanting to finish breakfast prior to OT session, will check back as time allows.  Harley Alto., COTA/L Acute Rehabilitation Services [phone]   [name] 01/31/2020, 8:44 AM

## 2020-01-31 NOTE — TOC Transition Note (Signed)
Transition of Care [facility]) - CM/SW Discharge Note   Patient Details  Name: Jasmeet Manton MRN: [id number] Date of Birth: May 27, 1945  [facility] CM/SW Contact:  Joanne Chars, LCSW Phone Number: 01/31/2020, 8:29 AM   Clinical Narrative:   Pt discharging to [facility], Room 1203P.  RN call report to [phone].    Final next level of care: Skilled Nursing Facility Barriers to Discharge: Barriers Resolved   Patient Goals and CMS Choice        Discharge Placement              Patient chooses bed at: [facility] Patient to be transferred to facility by: [facility] Name of family member notified: [name] Patient and family notified of of transfer: 01/31/20  Discharge Plan and Services                                     Social Determinants of Health (SDOH) Interventions     Readmission Risk Interventions Readmission Risk Prevention Plan 11/13/2019  Post Dischage Appt Complete  Medication Screening Complete  Transportation Screening Complete  Some recent data might be hidden

## 2020-02-01 DIAGNOSIS — Z86711 Personal history of pulmonary embolism: Secondary | ICD-10-CM | POA: Diagnosis not present

## 2020-02-01 DIAGNOSIS — D320 Benign neoplasm of cerebral meninges: Secondary | ICD-10-CM | POA: Diagnosis not present

## 2020-02-01 DIAGNOSIS — R5381 Other malaise: Secondary | ICD-10-CM | POA: Diagnosis not present

## 2020-02-01 DIAGNOSIS — G40909 Epilepsy, unspecified, not intractable, without status epilepticus: Secondary | ICD-10-CM | POA: Diagnosis not present

## 2020-02-05 DIAGNOSIS — R5381 Other malaise: Secondary | ICD-10-CM | POA: Diagnosis not present

## 2020-02-05 DIAGNOSIS — D320 Benign neoplasm of cerebral meninges: Secondary | ICD-10-CM | POA: Diagnosis not present

## 2020-02-05 DIAGNOSIS — G40909 Epilepsy, unspecified, not intractable, without status epilepticus: Secondary | ICD-10-CM | POA: Diagnosis not present

## 2020-02-05 DIAGNOSIS — M6281 Muscle weakness (generalized): Secondary | ICD-10-CM | POA: Diagnosis not present

## 2020-02-05 DIAGNOSIS — Z86711 Personal history of pulmonary embolism: Secondary | ICD-10-CM | POA: Diagnosis not present

## 2020-02-05 DIAGNOSIS — R2681 Unsteadiness on feet: Secondary | ICD-10-CM | POA: Diagnosis not present

## 2020-02-05 DIAGNOSIS — I509 Heart failure, unspecified: Secondary | ICD-10-CM | POA: Diagnosis not present

## 2020-02-08 ENCOUNTER — Non-Acute Institutional Stay: Payer: Self-pay | Admitting: Adult Health Nurse Practitioner

## 2020-02-08 ENCOUNTER — Other Ambulatory Visit: Payer: Self-pay

## 2020-02-08 DIAGNOSIS — R5381 Other malaise: Secondary | ICD-10-CM

## 2020-02-08 DIAGNOSIS — Z515 Encounter for palliative care: Secondary | ICD-10-CM

## 2020-02-08 DIAGNOSIS — D329 Benign neoplasm of meninges, unspecified: Secondary | ICD-10-CM

## 2020-02-08 NOTE — Progress Notes (Signed)
[facility] Palliative Care Consult Note Telephone: [phone]  Fax: [phone]  PATIENT NAME: [name] [name] DOB: May 02, 1945 MRN: [id number]  PRIMARY CARE PROVIDER:   Darreld Mclean, MD  REFERRING PROVIDER:  Roddie Mc PA  RESPONSIBLE PARTY:   [name], [facility] POA/fianc [phone]  Chief complaint: Initial palliative visit/debility    RECOMMENDATIONS and PLAN:  1.  Advanced care planning.  Patient is DNR.  MOST in epic indicates DNR, comfort measures, antibiotics with limited use for infection, IV fluids for trial, and no feeding tube.  Spoke with fianc via telephone.  2.  Meningioma.  Tumor to meninges believed to have led to seizure activity and her increased weakness.  She is working with PT/OT with some noted improvement.  Continue PT/OT as ordered  3.  Debility.  Patient having functional decline related to meningioma.  Continue PT/OT as ordered.  Did discuss with fianc whether the plan was to keep her at facility or to bring her home.  He does state that he would love to be able to bring her home but is realistic and realizes that the tumor is not going away and that she will further decline.  He does state that more than likely she will become a long-term resident at the facility.  This will be an ongoing discussion as we see how she progresses with therapy  Palliative will continue to monitor for symptom management/decline and make recommendations as needed.  Will follow up in 2-4 weeks.  Fiancee encouraged to call with any questions or concerns.  I spent 45 minutes providing this consultation, including time spent with patient/family, talking with staff at facility, chart review, documentation. More than 50% of the time in this consultation was spent coordinating communication.   HISTORY OF PRESENT ILLNESS:  [name] [name] is a 75 y.o. year old female with multiple medical problems including meningioma, HLD, epilepsy, HTN, CHF,  GERD, H/O PE. Palliative Care was asked to help address goals of care.  Reviewed medical chart and most recent labs and imaging from hospital visit.  Patient was hospitalized 1/10 through 01/30/2020 for increased confusion, weakness, difficulty walking.  Patient having seizure activity due to meningioma.  Patient was found to have UTI and was treated for this.  Patient was not doing well during hospital visit and hospice was considered.  Patient did perk up and improve once started on dexamethasone and Keppra.  Patient is now at [facility] receiving short-term rehab.  Ellene Route does state that he is noticed when she was working with rehab but they have been able to get her up and she would walk short distances.  Staff does report that she is extensive assist x1.  She is incontinent of bowel and bladder.  Today patient is using her right and left hand to hold utensils and eat her lunch.  She does not answer very many questions.  When she does respond it is with either yes or no.  Ellene Route states that she used to be a talker and now she does not talk as much.  Ellene Route does state that prior to going to the hospital she was using a cane and about a week before going into the hospital she started using a walker.  No seizure activity since being at the facility.  Patient unable to complete HPI/ROS and spoke with staff.  Patient is eating about 50% of her meals and has lost 5 pounds since being at the facility.  Upon admission she was 165.6 pounds  and now she is 160.6 pounds.  Rest of 10 point ROS asked and negative  CODE STATUS: DNR  PPS: 30% HOSPICE ELIGIBILITY/DIAGNOSIS: TBD  PHYSICAL EXAM:  HR 81 O2 99% on room air General: NAD, frail appearing, thin Eyes: Sclera anicteric and noninjected with no discharge noted Cardiovascular: regular rate and rhythm Pulmonary: Lung sounds clear; normal respiratory effort Abdomen: soft, nontender, + bowel sounds Extremities: no edema, no joint deformities Skin: no rashes on exposed  skin Neurological: Weakness; patient not talking very much today, does answer a few yes/no questions  Family History  Problem Relation Age of Onset  . Breast cancer Mother   . Heart disease Father   . Colon cancer Maternal Aunt       PAST MEDICAL HISTORY:  Past Medical History:  Diagnosis Date  . Cancer ([place])    skin cancer   . History of radiation therapy 01/09/19- 01/19/19   SRS brain radiation. 5 fractions to total 25 Gy  . Hyperlipidemia   . Hypertension   . IBS (irritable bowel syndrome)   . Osteoporosis   . PONV (postoperative nausea and vomiting)    with breast surgery  . SOBOE (shortness of breath on exertion)     SOCIAL HX:  Social History   Tobacco Use  . Smoking status: Never Smoker  . Smokeless tobacco: Never Used  Substance Use Topics  . Alcohol use: No    ALLERGIES:  Allergies  Allergen Reactions  . Bee Venom Swelling and Other (See Comments)    Site of swelling not recalled by family- happened during childhood  . Losartan Itching     PERTINENT MEDICATIONS:  Outpatient Encounter Medications as of 02/08/2020  Medication Sig  . acetaminophen (TYLENOL) 325 MG tablet Take 325-650 mg by mouth every 6 (six) hours as needed for mild pain or headache.  . dexamethasone (DECADRON) 4 MG tablet Take 1 tablet (4 mg total) by mouth daily.  Marland Kitchen levETIRAcetam (KEPPRA) 750 MG tablet Take 2 tablets (1,500 mg total) by mouth 2 (two) times daily.   No facility-administered encounter medications on file as of 02/08/2020.     Amy Jenetta Downer, NP

## 2020-02-18 DIAGNOSIS — D320 Benign neoplasm of cerebral meninges: Secondary | ICD-10-CM | POA: Diagnosis not present

## 2020-02-18 IMAGING — MR MR HEAD WO/W CM
8 of 11 series · 26 of 48 positions shown · IV contrast (multihance)
Comparison: Preoperative brain MRIs 09/04/2017 and earlier.

CLINICAL DATA: 71-year-old female postoperative day 1 bi-coronal
craniotomy for resection of left frontal parasagittal meningioma,
resection of the anterior 3rd of the superior sagittal sinus,
duraplasty.

Left cavernous sinus and intra sellar meningioma also suspected.
EXAM:
MRI HEAD WITHOUT AND WITH CONTRAST
TECHNIQUE: Multiplanar, multiecho pulse sequences of the brain and surrounding
structures were obtained without and with intravenous contrast.
CONTRAST:  14mL MULTIHANCE GADOBENATE DIMEGLUMINE 529 MG/ML IV SOLN

[Series 3: DWI · axial · 3.0mm · 0.94mm/px · z∈[-50,+96]mm · 8 of 100 slices shown]
[im 1/100]
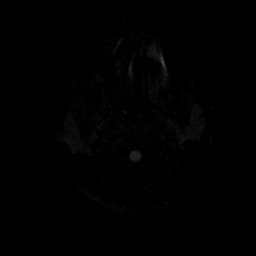
[im 12/100]
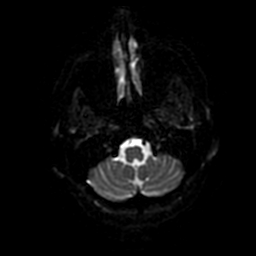
[im 34/100]
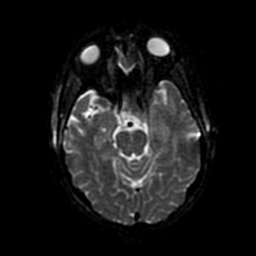
[im 45/100]
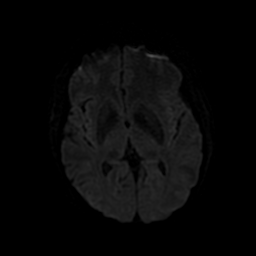
[im 56/100]
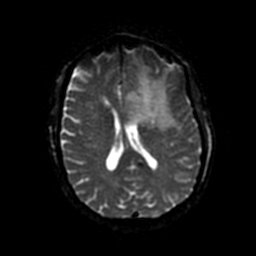
[im 67/100]
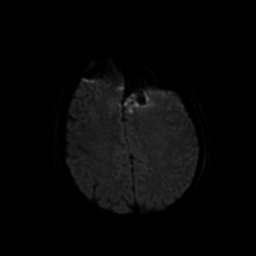
[im 89/100]
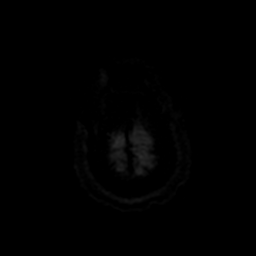
[im 100/100]
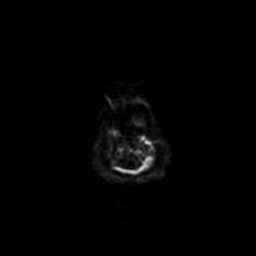

[Series 4: FLAIR · sagittal · 5.0mm · 0.47mm/px · 2 of 26 slices shown (1 of 3)]
[im 1/26]
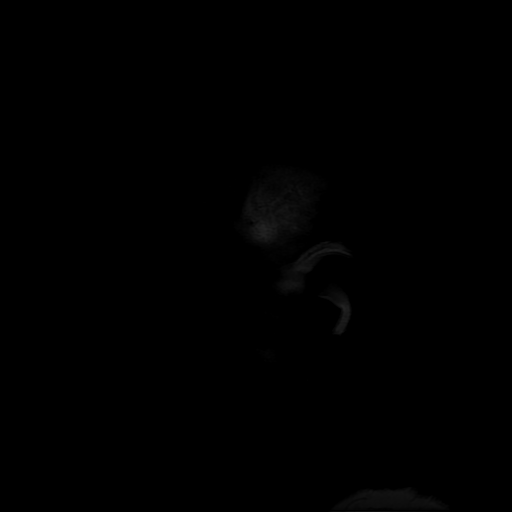
[im 26/26]
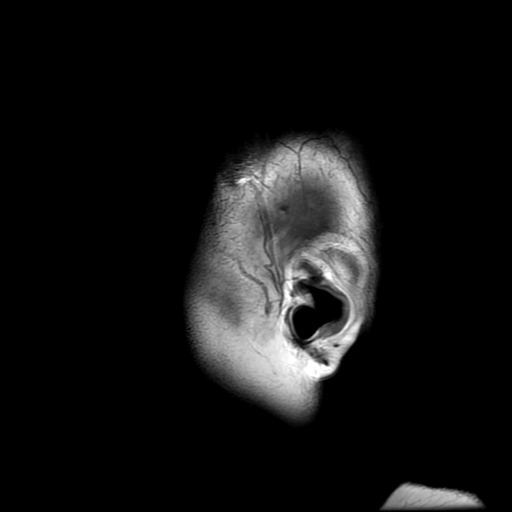

[Series 5: T2 · axial · 5.0mm · 0.47mm/px · z∈[-52,+97]mm · 2 of 26 slices shown]
[im 1/26]
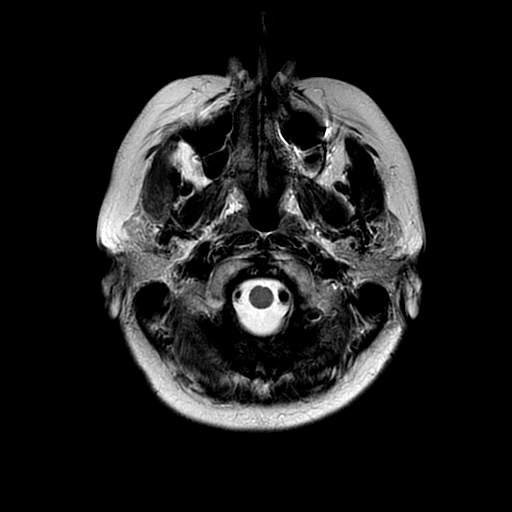
[im 26/26]
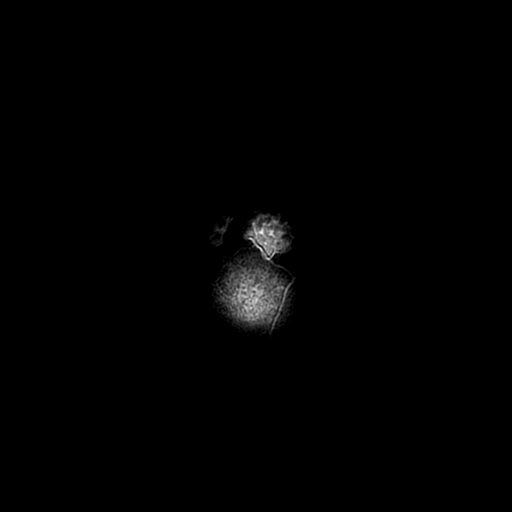

[Series 6: FLAIR · axial · 3.0mm · 0.47mm/px · z∈[-52,+97]mm · 2 of 26 slices shown (2 of 3)]
[im 1/26]
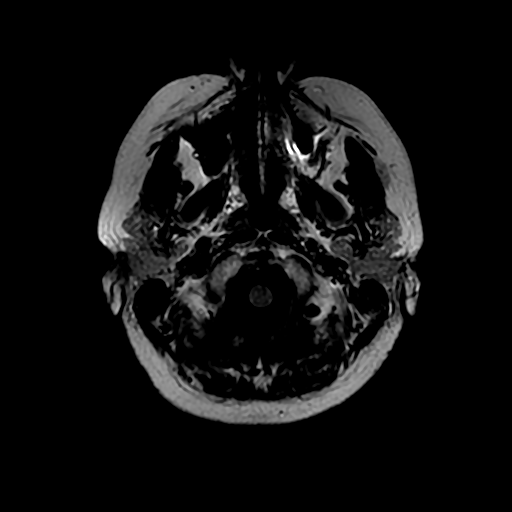
[im 26/26]
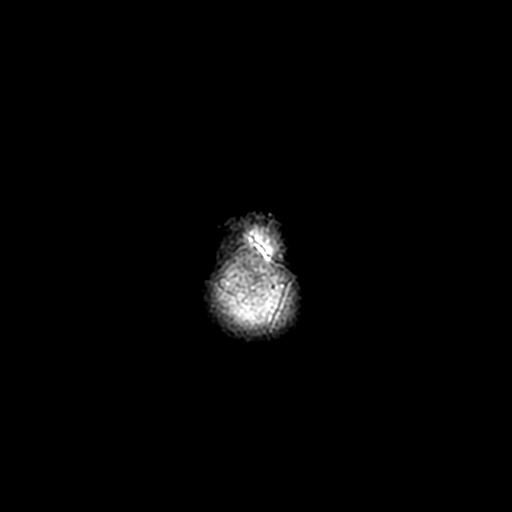

[Series 9: T2 post-contrast · coronal · 5.0mm · 0.39mm/px · 3 of 27 slices shown]
[im 1/27]
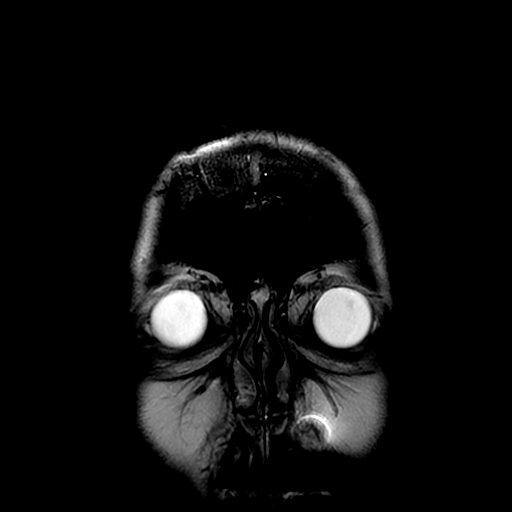
[im 14/27]
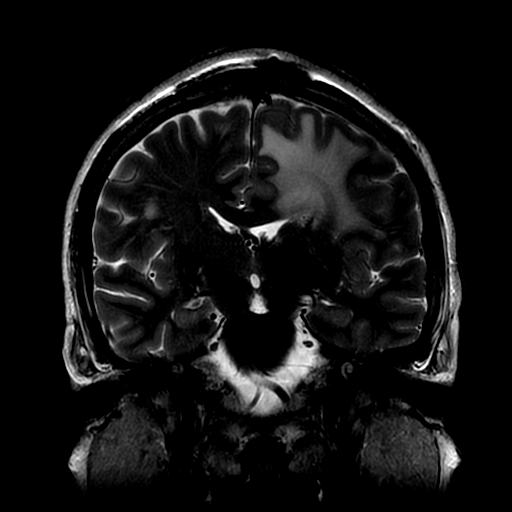
[im 27/27]
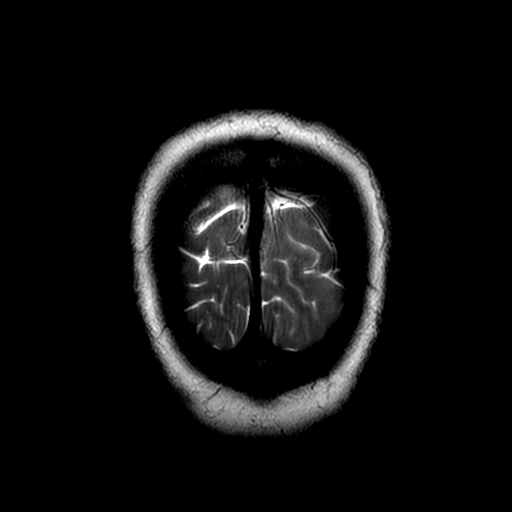

[Series 11: T1 · coronal · 5.0mm · 0.43mm/px · 2 of 28 slices shown]
[im 1/28]
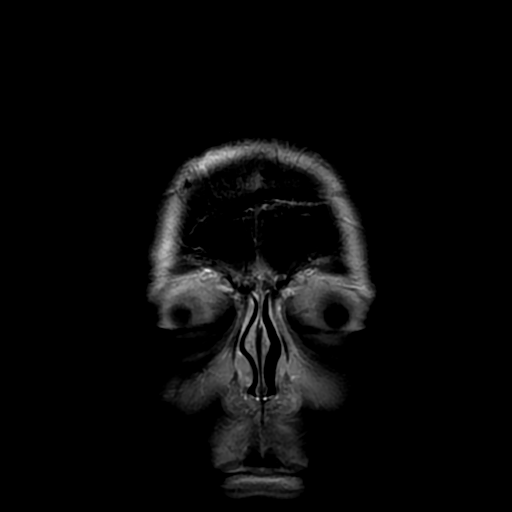
[im 14/28]
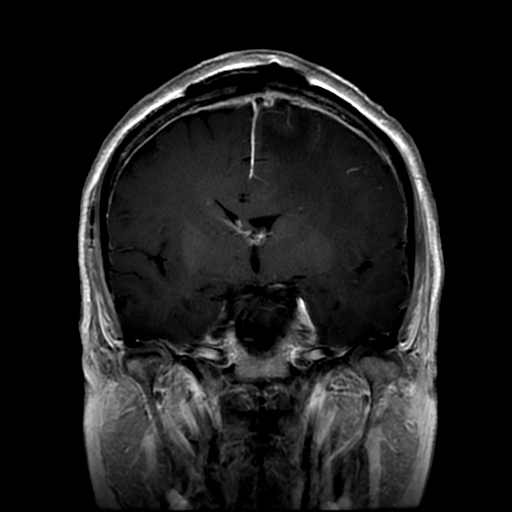

[Series 12: FLAIR · sagittal · 5.0mm · 0.47mm/px · 2 of 26 slices shown (3 of 3)]
[im 1/26]
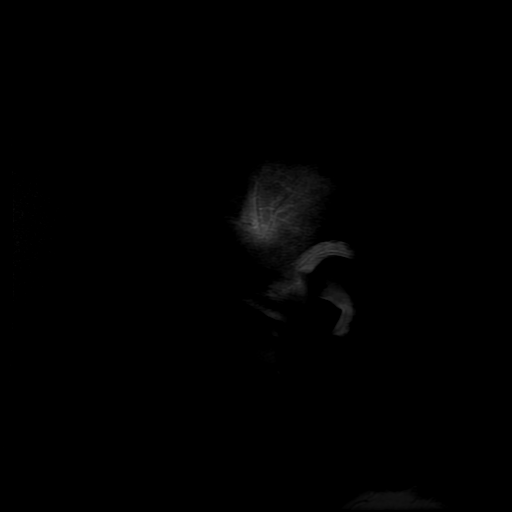
[im 26/26]
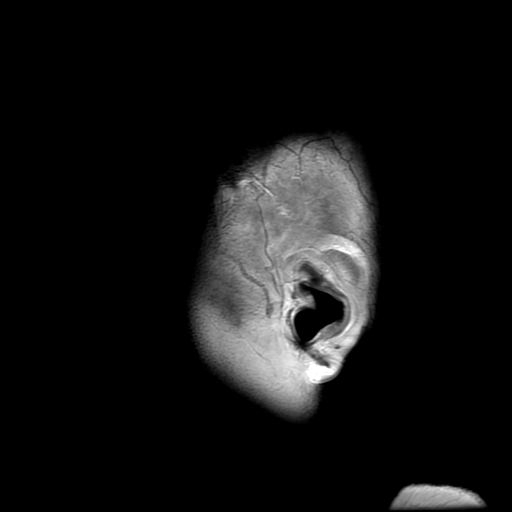

[Series 350: ADC · axial · 3.0mm · 0.94mm/px · z∈[-50,+96]mm · 5 of 49 slices shown]
[im 1/49]
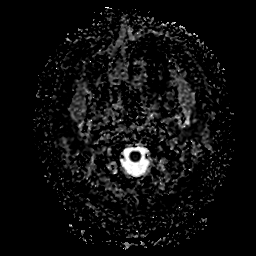
[im 13/49]
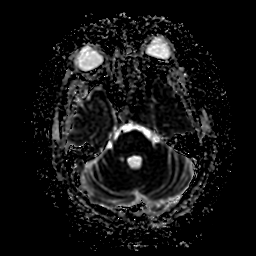
[im 25/49]
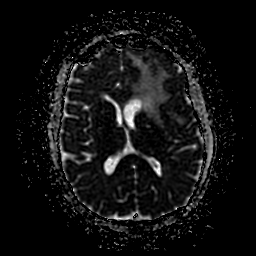
[im 37/49]
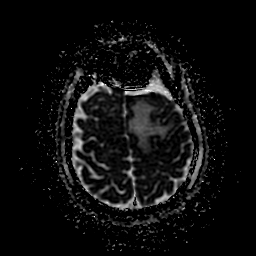
[im 49/49]
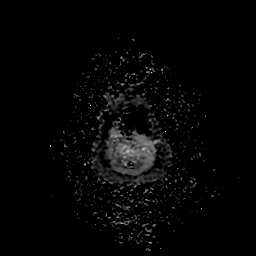

[26 of 48 positions shown; findings below may reference images not displayed]

FINDINGS: Brain: Unchanged 3.4 centimeter lobular and infiltrative
homogeneously enhancing mass in the cavernous sinus and sella,
eccentric to the left only the a right far lateral cavernous sinus
appears spared.

Sequelae of anterior craniotomy with mild pneumocephalus and
extra-axial fluid along the anterior frontal lobes. Left anterior
frontal resection site is demonstrated with mild regional
superficial brain restricted diffusion (series 3, image 33) and
regional blood products. Stable regional T2 and FLAIR hyperintensity
resembling vasogenic edema in the anterior half of the left
hemisphere. However, following contrast no residual enhancing tumor
at this site is identified. There is smooth postoperative appearing
dural thickening over both superior convexities (series 11, image
15). Note is made of slightly irregular increased enhancement along
the residual anterior falx and corresponding to the superior
sagittal sinus resection margin on series 11, image 17, and also a
tiny nodular focus of enhancement seen along the resection margin on
series 11, image 21.

There is also a small focus of restricted diffusion in the right
cingulate gyrus on series 3, image 36. No other diffusion
restriction or acute infarct.

Largely stable intracranial mass effect including on the frontal
horns. Stable ventricle size and configuration. Basilar cisterns
remain patent. Negative cervicomedullary junction.

Vascular: Major intracranial vascular flow voids are stable. Mildly
narrow it appearance of the cavernous left ICA siphon is stable. The
residual superior sagittal sinus and other major dural venous
sinuses are enhancing and appear patent.

Skull and upper cervical spine: Anterior craniotomy. Underlying bone
marrow signal is stable including a small indeterminate area of
nodular enhancement at the right parietal bone (series 10, image 42)
which is probably benign. Negative visible cervical spine and spinal
cord.

Sinuses/Orbits: Stable abnormal left orbital apex in association
with the cavernous sinus mass. The remaining orbits soft tissues are
stable and negative. Paranasal sinuses are stable in clear.

Other: Mastoid air cells remain clear. Visible internal auditory
structures appear normal. Postoperative changes to the scalp.
IMPRESSION: 1. Gross total resection of the anterior left frontal meningioma.
Trace operative ischemia along the resection cavity margins, and a
small contralateral ischemic focus in the right cingulate gyrus.
Attention directed on follow-up restaging studies to several areas
of mild enhancement as seen on series [DATE]. Stable cerebral edema and intracranial mass effect at this time.
3. Stable lobulated 3.4 cm cavernous sinus, intra sellar, and left
orbital apex meningioma.

## 2020-02-19 DIAGNOSIS — Z86711 Personal history of pulmonary embolism: Secondary | ICD-10-CM | POA: Diagnosis not present

## 2020-02-19 DIAGNOSIS — M6281 Muscle weakness (generalized): Secondary | ICD-10-CM | POA: Diagnosis not present

## 2020-02-19 DIAGNOSIS — D320 Benign neoplasm of cerebral meninges: Secondary | ICD-10-CM | POA: Diagnosis not present

## 2020-02-19 DIAGNOSIS — R2681 Unsteadiness on feet: Secondary | ICD-10-CM | POA: Diagnosis not present

## 2020-02-20 ENCOUNTER — Ambulatory Visit: Payer: Medicare Other | Admitting: Family Medicine

## 2020-02-29 DIAGNOSIS — G40909 Epilepsy, unspecified, not intractable, without status epilepticus: Secondary | ICD-10-CM | POA: Diagnosis not present

## 2020-02-29 DIAGNOSIS — D320 Benign neoplasm of cerebral meninges: Secondary | ICD-10-CM | POA: Diagnosis not present

## 2020-03-13 ENCOUNTER — Telehealth: Payer: Self-pay | Admitting: Family Medicine

## 2020-03-13 ENCOUNTER — Encounter: Payer: Self-pay | Admitting: Family Medicine

## 2020-03-13 NOTE — Telephone Encounter (Signed)
Caller [name] [name]  Call Back @ [phone]  [name] [name] wanted to notify Dr Lorelei Pont of patient's passing.Patient passed last week.

## 2020-03-13 NOTE — Telephone Encounter (Signed)
Provider aware. Sympathy card mailed to patients family.

## 2020-04-04 DEATH — deceased
